# Patient Record
Sex: Male | Born: 1940 | Race: White | Hispanic: No | Marital: Married | Smoking: Former smoker
Health system: Southern US, Community
[De-identification: ages and names within clinical notes are randomized; demographics above are authoritative.]

## PROBLEM LIST (undated history)

## (undated) DIAGNOSIS — E041 Nontoxic single thyroid nodule: Secondary | ICD-10-CM

## (undated) DIAGNOSIS — G629 Polyneuropathy, unspecified: Secondary | ICD-10-CM

## (undated) DIAGNOSIS — I341 Nonrheumatic mitral (valve) prolapse: Secondary | ICD-10-CM

## (undated) DIAGNOSIS — M1990 Unspecified osteoarthritis, unspecified site: Secondary | ICD-10-CM

## (undated) DIAGNOSIS — G709 Myoneural disorder, unspecified: Secondary | ICD-10-CM

## (undated) DIAGNOSIS — F419 Anxiety disorder, unspecified: Secondary | ICD-10-CM

## (undated) DIAGNOSIS — M4800 Spinal stenosis, site unspecified: Secondary | ICD-10-CM

## (undated) DIAGNOSIS — R42 Dizziness and giddiness: Secondary | ICD-10-CM

## (undated) DIAGNOSIS — R011 Cardiac murmur, unspecified: Secondary | ICD-10-CM

## (undated) DIAGNOSIS — G8929 Other chronic pain: Secondary | ICD-10-CM

## (undated) DIAGNOSIS — E785 Hyperlipidemia, unspecified: Secondary | ICD-10-CM

## (undated) DIAGNOSIS — I4891 Unspecified atrial fibrillation: Secondary | ICD-10-CM

## (undated) DIAGNOSIS — F0390 Unspecified dementia without behavioral disturbance: Secondary | ICD-10-CM

## (undated) DIAGNOSIS — I2699 Other pulmonary embolism without acute cor pulmonale: Secondary | ICD-10-CM

## (undated) DIAGNOSIS — C801 Malignant (primary) neoplasm, unspecified: Secondary | ICD-10-CM

## (undated) DIAGNOSIS — K635 Polyp of colon: Secondary | ICD-10-CM

## (undated) DIAGNOSIS — K219 Gastro-esophageal reflux disease without esophagitis: Secondary | ICD-10-CM

## (undated) DIAGNOSIS — N281 Cyst of kidney, acquired: Secondary | ICD-10-CM

## (undated) DIAGNOSIS — M549 Dorsalgia, unspecified: Secondary | ICD-10-CM

## (undated) HISTORY — PX: APPENDECTOMY: SHX54

## (undated) HISTORY — PX: BACK SURGERY: SHX140

## (undated) HISTORY — PX: TONSILLECTOMY: SUR1361

---

## 2000-03-10 ENCOUNTER — Ambulatory Visit (HOSPITAL_COMMUNITY): Admission: RE | Admit: 2000-03-10 | Discharge: 2000-03-10 | Payer: Self-pay | Admitting: Neurosurgery

## 2000-03-10 ENCOUNTER — Encounter: Payer: Self-pay | Admitting: Neurosurgery

## 2000-04-05 ENCOUNTER — Encounter: Payer: Self-pay | Admitting: Neurosurgery

## 2000-04-05 ENCOUNTER — Ambulatory Visit (HOSPITAL_COMMUNITY): Admission: RE | Admit: 2000-04-05 | Discharge: 2000-04-05 | Payer: Self-pay | Admitting: Neurosurgery

## 2000-07-18 ENCOUNTER — Ambulatory Visit (HOSPITAL_COMMUNITY): Admission: RE | Admit: 2000-07-18 | Discharge: 2000-07-18 | Payer: Self-pay | Admitting: Neurology

## 2000-07-18 ENCOUNTER — Encounter: Payer: Self-pay | Admitting: Neurology

## 2004-01-01 ENCOUNTER — Ambulatory Visit: Payer: Self-pay | Admitting: Unknown Physician Specialty

## 2004-12-01 ENCOUNTER — Inpatient Hospital Stay: Payer: Self-pay | Admitting: Internal Medicine

## 2005-01-09 ENCOUNTER — Ambulatory Visit: Payer: Self-pay | Admitting: Internal Medicine

## 2005-01-23 ENCOUNTER — Ambulatory Visit: Payer: Self-pay | Admitting: Internal Medicine

## 2005-05-27 ENCOUNTER — Ambulatory Visit: Payer: Self-pay | Admitting: Internal Medicine

## 2005-10-13 ENCOUNTER — Ambulatory Visit: Payer: Self-pay | Admitting: Internal Medicine

## 2007-07-27 ENCOUNTER — Ambulatory Visit: Payer: Self-pay | Admitting: Internal Medicine

## 2009-10-06 ENCOUNTER — Emergency Department: Payer: Self-pay | Admitting: Internal Medicine

## 2010-06-10 ENCOUNTER — Encounter: Payer: Self-pay | Admitting: Otolaryngology

## 2011-09-11 DIAGNOSIS — G629 Polyneuropathy, unspecified: Secondary | ICD-10-CM | POA: Insufficient documentation

## 2011-09-11 DIAGNOSIS — N281 Cyst of kidney, acquired: Secondary | ICD-10-CM | POA: Insufficient documentation

## 2011-09-11 DIAGNOSIS — K219 Gastro-esophageal reflux disease without esophagitis: Secondary | ICD-10-CM | POA: Insufficient documentation

## 2011-09-11 DIAGNOSIS — E785 Hyperlipidemia, unspecified: Secondary | ICD-10-CM | POA: Insufficient documentation

## 2011-09-11 DIAGNOSIS — R011 Cardiac murmur, unspecified: Secondary | ICD-10-CM | POA: Insufficient documentation

## 2011-09-11 DIAGNOSIS — I2699 Other pulmonary embolism without acute cor pulmonale: Secondary | ICD-10-CM | POA: Insufficient documentation

## 2011-09-11 DIAGNOSIS — F419 Anxiety disorder, unspecified: Secondary | ICD-10-CM | POA: Insufficient documentation

## 2011-09-11 DIAGNOSIS — G8929 Other chronic pain: Secondary | ICD-10-CM | POA: Insufficient documentation

## 2012-10-04 DIAGNOSIS — Z7901 Long term (current) use of anticoagulants: Secondary | ICD-10-CM | POA: Insufficient documentation

## 2013-07-04 ENCOUNTER — Other Ambulatory Visit: Payer: Self-pay | Admitting: Internal Medicine

## 2013-07-04 LAB — CBC WITH DIFFERENTIAL/PLATELET
Basophil #: 0.1 10*3/uL (ref 0.0–0.1)
Basophil %: 1.2 %
Eosinophil #: 0.3 10*3/uL (ref 0.0–0.7)
Eosinophil %: 4.2 %
HCT: 46.9 % (ref 40.0–52.0)
HGB: 15.7 g/dL (ref 13.0–18.0)
Lymphocyte #: 2.2 10*3/uL (ref 1.0–3.6)
Lymphocyte %: 29.8 %
MCH: 30.4 pg (ref 26.0–34.0)
MCHC: 33.5 g/dL (ref 32.0–36.0)
MCV: 91 fL (ref 80–100)
Monocyte #: 0.8 x10 3/mm (ref 0.2–1.0)
Monocyte %: 10.9 %
Neutrophil #: 3.9 10*3/uL (ref 1.4–6.5)
Neutrophil %: 53.9 %
Platelet: 187 10*3/uL (ref 150–440)
RBC: 5.16 10*6/uL (ref 4.40–5.90)
RDW: 13.6 % (ref 11.5–14.5)
WBC: 7.2 10*3/uL (ref 3.8–10.6)

## 2013-07-04 LAB — PROTIME-INR
INR: 2.3
Prothrombin Time: 25 secs — ABNORMAL HIGH (ref 11.5–14.7)

## 2013-07-04 LAB — D-DIMER(ARMC): D-Dimer: 470 ng/ml

## 2014-03-11 ENCOUNTER — Emergency Department: Payer: Self-pay | Admitting: Emergency Medicine

## 2014-07-20 DIAGNOSIS — Z86010 Personal history of colonic polyps: Secondary | ICD-10-CM | POA: Insufficient documentation

## 2014-07-20 DIAGNOSIS — Z860101 Personal history of adenomatous and serrated colon polyps: Secondary | ICD-10-CM | POA: Insufficient documentation

## 2014-08-15 ENCOUNTER — Ambulatory Visit: Payer: Medicare Other | Admitting: Anesthesiology

## 2014-08-15 ENCOUNTER — Encounter
Admission: RE | Disposition: A | Discharge status: Home / Self Care | Payer: Self-pay | Source: Ambulatory Visit | Attending: Unknown Physician Specialty

## 2014-08-15 ENCOUNTER — Ambulatory Visit
Admission: RE | Admit: 2014-08-15 | Discharge: 2014-08-15 | Disposition: A | Discharge status: Home / Self Care | Payer: Medicare Other | Source: Ambulatory Visit | Attending: Unknown Physician Specialty | Admitting: Unknown Physician Specialty

## 2014-08-15 ENCOUNTER — Encounter: Payer: Self-pay | Admitting: *Deleted

## 2014-08-15 DIAGNOSIS — M5489 Other dorsalgia: Secondary | ICD-10-CM | POA: Diagnosis not present

## 2014-08-15 DIAGNOSIS — I4891 Unspecified atrial fibrillation: Secondary | ICD-10-CM | POA: Insufficient documentation

## 2014-08-15 DIAGNOSIS — F419 Anxiety disorder, unspecified: Secondary | ICD-10-CM | POA: Diagnosis not present

## 2014-08-15 DIAGNOSIS — D125 Benign neoplasm of sigmoid colon: Secondary | ICD-10-CM | POA: Insufficient documentation

## 2014-08-15 DIAGNOSIS — R42 Dizziness and giddiness: Secondary | ICD-10-CM | POA: Diagnosis not present

## 2014-08-15 DIAGNOSIS — G629 Polyneuropathy, unspecified: Secondary | ICD-10-CM | POA: Insufficient documentation

## 2014-08-15 DIAGNOSIS — E785 Hyperlipidemia, unspecified: Secondary | ICD-10-CM | POA: Diagnosis not present

## 2014-08-15 DIAGNOSIS — Z79899 Other long term (current) drug therapy: Secondary | ICD-10-CM | POA: Insufficient documentation

## 2014-08-15 DIAGNOSIS — R011 Cardiac murmur, unspecified: Secondary | ICD-10-CM | POA: Insufficient documentation

## 2014-08-15 DIAGNOSIS — Z881 Allergy status to other antibiotic agents status: Secondary | ICD-10-CM | POA: Insufficient documentation

## 2014-08-15 DIAGNOSIS — Z1211 Encounter for screening for malignant neoplasm of colon: Secondary | ICD-10-CM | POA: Diagnosis not present

## 2014-08-15 DIAGNOSIS — K219 Gastro-esophageal reflux disease without esophagitis: Secondary | ICD-10-CM | POA: Diagnosis not present

## 2014-08-15 DIAGNOSIS — Z880 Allergy status to penicillin: Secondary | ICD-10-CM | POA: Insufficient documentation

## 2014-08-15 DIAGNOSIS — D122 Benign neoplasm of ascending colon: Secondary | ICD-10-CM | POA: Insufficient documentation

## 2014-08-15 DIAGNOSIS — I2699 Other pulmonary embolism without acute cor pulmonale: Secondary | ICD-10-CM | POA: Insufficient documentation

## 2014-08-15 DIAGNOSIS — M4800 Spinal stenosis, site unspecified: Secondary | ICD-10-CM | POA: Diagnosis not present

## 2014-08-15 DIAGNOSIS — D123 Benign neoplasm of transverse colon: Secondary | ICD-10-CM | POA: Diagnosis not present

## 2014-08-15 DIAGNOSIS — Z7901 Long term (current) use of anticoagulants: Secondary | ICD-10-CM | POA: Diagnosis not present

## 2014-08-15 DIAGNOSIS — R12 Heartburn: Secondary | ICD-10-CM | POA: Diagnosis not present

## 2014-08-15 DIAGNOSIS — K317 Polyp of stomach and duodenum: Secondary | ICD-10-CM | POA: Insufficient documentation

## 2014-08-15 DIAGNOSIS — K449 Diaphragmatic hernia without obstruction or gangrene: Secondary | ICD-10-CM | POA: Diagnosis not present

## 2014-08-15 DIAGNOSIS — Z86010 Personal history of colonic polyps: Secondary | ICD-10-CM | POA: Diagnosis not present

## 2014-08-15 HISTORY — DX: Other pulmonary embolism without acute cor pulmonale: I26.99

## 2014-08-15 HISTORY — DX: Unspecified atrial fibrillation: I48.91

## 2014-08-15 HISTORY — DX: Gastro-esophageal reflux disease without esophagitis: K21.9

## 2014-08-15 HISTORY — DX: Polyp of colon: K63.5

## 2014-08-15 HISTORY — DX: Myoneural disorder, unspecified: G70.9

## 2014-08-15 HISTORY — DX: Anxiety disorder, unspecified: F41.9

## 2014-08-15 HISTORY — DX: Unspecified osteoarthritis, unspecified site: M19.90

## 2014-08-15 HISTORY — DX: Hyperlipidemia, unspecified: E78.5

## 2014-08-15 HISTORY — PX: ESOPHAGOGASTRODUODENOSCOPY: SHX5428

## 2014-08-15 HISTORY — DX: Other chronic pain: G89.29

## 2014-08-15 HISTORY — DX: Dizziness and giddiness: R42

## 2014-08-15 HISTORY — DX: Spinal stenosis, site unspecified: M48.00

## 2014-08-15 HISTORY — DX: Nontoxic single thyroid nodule: E04.1

## 2014-08-15 HISTORY — DX: Cyst of kidney, acquired: N28.1

## 2014-08-15 HISTORY — DX: Dorsalgia, unspecified: M54.9

## 2014-08-15 HISTORY — DX: Cardiac murmur, unspecified: R01.1

## 2014-08-15 HISTORY — DX: Polyneuropathy, unspecified: G62.9

## 2014-08-15 HISTORY — PX: COLONOSCOPY: SHX5424

## 2014-08-15 HISTORY — DX: Nonrheumatic mitral (valve) prolapse: I34.1

## 2014-08-15 SURGERY — COLONOSCOPY
Anesthesia: General

## 2014-08-15 MED ORDER — SODIUM CHLORIDE 0.9 % IV SOLN: INTRAVENOUS | Status: DC

## 2014-08-15 MED ORDER — FENTANYL CITRATE (PF) 100 MCG/2ML IJ SOLN
INTRAMUSCULAR | Status: DC | PRN
  Administered 2014-08-15: 50 ug via INTRAVENOUS

## 2014-08-15 MED ORDER — MIDAZOLAM HCL 5 MG/5ML IJ SOLN
INTRAMUSCULAR | Status: DC | PRN
  Administered 2014-08-15: 1 mg via INTRAVENOUS

## 2014-08-15 MED ORDER — LIDOCAINE HCL (PF) 2 % IJ SOLN
INTRAMUSCULAR | Status: DC | PRN
  Administered 2014-08-15: 50 mg

## 2014-08-15 MED ORDER — PROPOFOL INFUSION 10 MG/ML OPTIME
INTRAVENOUS | Status: DC | PRN
  Administered 2014-08-15: 160 ug/kg/min via INTRAVENOUS

## 2014-08-15 MED ORDER — LACTATED RINGERS IV SOLN
INTRAVENOUS | Status: DC
  Administered 2014-08-15: 1000 mL via INTRAVENOUS

## 2014-08-15 MED ORDER — PROPOFOL 10 MG/ML IV BOLUS
INTRAVENOUS | Status: DC | PRN
  Administered 2014-08-15: 50 mg via INTRAVENOUS

## 2014-08-15 NOTE — Anesthesia Preprocedure Evaluation (Signed)
Anesthesia Evaluation  Patient identified by MRN, date of birth, ID band Patient awake    Reviewed: Allergy & Precautions, NPO status , Patient's Chart, lab work & pertinent test results  History of Anesthesia Complications Negative for: history of anesthetic complications  Airway Mallampati: III       Dental no notable dental hx. (+) Teeth Intact   Pulmonary former smoker,    Pulmonary exam normal       Cardiovascular negative cardio ROS Normal cardiovascular examRhythm:Regular Rate:Normal     Neuro/Psych Anxiety    GI/Hepatic Neg liver ROS, GERD-  ,  Endo/Other  negative endocrine ROS  Renal/GU Renal disease  negative genitourinary   Musculoskeletal  (+) Arthritis -, Osteoarthritis,    Abdominal Normal abdominal exam  (+)   Peds  Hematology negative hematology ROS (+)   Anesthesia Other Findings   Reproductive/Obstetrics negative OB ROS                             Anesthesia Physical Anesthesia Plan  ASA: II  Anesthesia Plan: General   Post-op Pain Management:    Induction: Intravenous  Airway Management Planned: Nasal Cannula  Additional Equipment:   Intra-op Plan:   Post-operative Plan:   Informed Consent: I have reviewed the patients History and Physical, chart, labs and discussed the procedure including the risks, benefits and alternatives for the proposed anesthesia with the patient or authorized representative who has indicated his/her understanding and acceptance.     Plan Discussed with: CRNA  Anesthesia Plan Comments:         Anesthesia Quick Evaluation

## 2014-08-15 NOTE — H&P (Signed)
   Primary Care Physician:  Tracie Harrier, MD Primary Gastroenterologist:  Dr. Vira Agar  Pre-Procedure History & Physical: HPI:  Mark [name]. is a 74 y.o. male is here for an endoscopy and colonoscopy.   Past Medical History  Diagnosis Date  . Colon polyp   . Atrial fibrillation   . Pulmonary emboli   . Neuropathy   . Neuromuscular disorder   . Vertigo   . Back pain, chronic   . GERD (gastroesophageal reflux disease)   . Hyperlipemia   . Anxiety   . Heart murmur   . Spinal stenosis   . Thyroid nodule   . Cyst of left kidney   . Osteoarthritis   . Mitral valve prolapse     Past Surgical History  Procedure Laterality Date  . Appendectomy    . Tonsillectomy    . Back surgery      Prior to Admission medications   Medication Sig Start Date End Date Taking? Authorizing Provider  Esomeprazole Magnesium (NEXIUM PO) Take 22.3 mg by mouth every Monday, Wednesday, and Friday.   Yes Historical Provider, MD  famotidine (PEPCID AC) 10 MG chewable tablet Chew 20 mg by mouth 2 (two) times daily as needed for heartburn.   Yes Historical Provider, MD  FLUoxetine (PROZAC) 20 MG capsule Take 20 mg by mouth daily. Wed, sat, sun   Yes Historical Provider, MD  FLUoxetine (PROZAC) 40 MG capsule Take 40 mg by mouth daily. Mon, tues, thurs, fri   Yes Historical Provider, MD  simvastatin (ZOCOR) 40 MG tablet Take 40 mg by mouth daily.   Yes Historical Provider, MD  warfarin (COUMADIN) 5 MG tablet Take 5 mg by mouth every evening. 1 tab on Mon, tues, thurs, fri. 1/2 tab on wed sat and sun   Yes Historical Provider, MD    Allergies as of 07/26/2014  . (Not on File)    History reviewed. No pertinent family history.  History   Social History  . Marital Status: Married    Spouse Name: N/A  . Number of Children: N/A  . Years of Education: N/A   Occupational History  . Not on file.   Social History Main Topics  . Smoking status: Never Smoker   . Smokeless tobacco: Not on file  .  Alcohol Use: Not on file  . Drug Use: Not on file  . Sexual Activity: Not on file   Other Topics Concern  . Not on file   Social History Narrative    Review of Systems: See HPI, otherwise negative ROS  Physical Exam: BP 128/71 mmHg  Pulse 61  Temp(Src) 98.6 F (37 C) (Oral)  Resp 18  Ht 6\' 4"  (1.93 m)  Wt 99.791 kg (220 lb)  BMI 26.79 kg/m2  SpO2 96% General:   Alert,  pleasant and cooperative in NAD Head:  Normocephalic and atraumatic. Neck:  Supple; no masses or thyromegaly. Lungs:  Clear throughout to auscultation.    Heart:  Regular rate and rhythm. Abdomen:  Soft, nontender and nondistended. Normal bowel sounds, without guarding, and without rebound.   Neurologic:  Alert and  oriented x4;  grossly normal neurologically.  Impression/Plan: Mark [name]. is here for an endoscopy and colonoscopy to be performed for GERD and personal history of colon polyps.  Risks, benefits, limitations, and alternatives regarding  endoscopy and colonoscopy have been reviewed with the patient.  Questions have been answered.  All parties agreeable.   Gaylyn Cheers, MD  08/15/2014, 11:08 AM

## 2014-08-15 NOTE — Op Note (Signed)
[facility] Gastroenterology Patient Name: Mark [name] Procedure Date: 08/15/2014 10:58 AM MRN: [id number] Account #: [id number] Date of Birth: 05-Mar-1941 Admit Type: Outpatient Age: 74 Room: [facility] ENDO ROOM 3 Gender: Male Note Status: Finalized Procedure:         Upper GI endoscopy Indications:       Heartburn Providers:         [name], MD Referring MD:      Tracie Harrier, MD (Referring MD) Medicines:         Propofol per Anesthesia Complications:     No immediate complications. Procedure:         Pre-Anesthesia Assessment:                    - After reviewing the risks and benefits, the patient was                     deemed in satisfactory condition to undergo the procedure.                    After obtaining informed consent, the endoscope was passed                     under direct vision. Throughout the procedure, the                     patient's blood pressure, pulse, and oxygen saturations                     were monitored continuously. The Olympus GIF-160 endoscope                     (S#. [id number]) was introduced through the mouth, and                     advanced to the second part of duodenum. The upper GI                     endoscopy was accomplished without difficulty. The patient                     tolerated the procedure well. Findings:      The examined esophagus was normal. GEJ 40cm      A small hiatus hernia was present.      Multiple small sessile polyps with no bleeding and no stigmata of recent       bleeding were found in the gastric body. Biopsies were taken with a cold       forceps for histology. One at 47cm was placed in its own container.      The examined duodenum was normal. Impression:        - Normal esophagus.                    - Small hiatus hernia.                    - Multiple gastric polyps. Biopsied.                    - Normal examined duodenum. Recommendation:    - Await pathology results. [name], MD 08/15/2014 11:23:29 AM This report has been signed electronically. Number of Addenda: 0 Note Initiated On: 08/15/2014 10:58 AM      [facility]

## 2014-08-15 NOTE — Op Note (Signed)
[facility] Gastroenterology Patient Name: Mark [name] Procedure Date: 08/15/2014 10:57 AM MRN: [id number] Account #: [id number] Date of Birth: December 27, 1940 Admit Type: Outpatient Age: 74 Room: [facility] ENDO ROOM 3 Gender: Male Note Status: Finalized Procedure:         Colonoscopy Indications:       Follow-up for history of adenomatous polyps in the colon Providers:         [name], MD Referring MD:      Tracie Harrier, MD (Referring MD) Medicines:         Propofol per Anesthesia Complications:     No immediate complications. Procedure:         Pre-Anesthesia Assessment:                    - After reviewing the risks and benefits, the patient was                     deemed in satisfactory condition to undergo the procedure.                    After obtaining informed consent, the colonoscope was                     passed under direct vision. Throughout the procedure, the                     patient's blood pressure, pulse, and oxygen saturations                     were monitored continuously. The Colonoscope was                     introduced through the anus and advanced to the the cecum,                     identified by appendiceal orifice and ileocecal valve. The                     colonoscopy was performed without difficulty. The patient                     tolerated the procedure well. The quality of the bowel                     preparation was excellent. Findings:      A small polyp was found in the ascending colon. The polyp was sessile.       The polyp was removed with a cold snare. Resection and retrieval were       complete.      A diminutive polyp was found in the transverse colon. The polyp was       sessile. The polyp was removed with a jumbo cold forceps. Resection and       retrieval were complete.      A small polyp was found in the sigmoid colon. The polyp was sessile. The       polyp was removed with a cold snare. Resection and  retrieval were       complete.      A few small-mouthed diverticula were found in the sigmoid colon. Impression:        - One small polyp in the ascending colon. Resected and  retrieved.                    - One diminutive polyp in the transverse colon. Resected                     and retrieved.                    - One small polyp in the sigmoid colon. Resected and                     retrieved. Recommendation:    - Await pathology results. [name], MD 08/15/2014 11:55:06 AM This report has been signed electronically. Number of Addenda: 0 Note Initiated On: 08/15/2014 10:57 AM Scope Withdrawal Time: 0 hours 14 minutes 6 seconds  Total Procedure Duration: 0 hours 25 minutes 31 seconds       [facility]

## 2014-08-15 NOTE — Transfer of Care (Signed)
Immediate Anesthesia Transfer of Care Note  Patient: Mark [name].  Procedure(s) Performed: Procedure(s): COLONOSCOPY (N/A) ESOPHAGOGASTRODUODENOSCOPY (EGD) (N/A)  Patient Location: PACU  Anesthesia Type:General  Level of Consciousness: sedated  Airway & Oxygen Therapy: Patient Spontanous Breathing and Patient connected to nasal cannula oxygen  Post-op Assessment: Report given to RN and Post -op Vital signs reviewed and stable  Post vital signs: Reviewed and stable  Last Vitals:  Filed Vitals:   08/15/14 1025  BP: 128/71  Pulse: 61  Temp: 37 C  Resp: 18    Complications: No apparent anesthesia complications

## 2014-08-16 ENCOUNTER — Encounter: Payer: Self-pay | Admitting: Unknown Physician Specialty

## 2014-08-17 LAB — SURGICAL PATHOLOGY

## 2014-08-24 NOTE — Anesthesia Postprocedure Evaluation (Signed)
  Anesthesia Post-op Note  Patient: Mark [name].  Procedure(s) Performed: Procedure(s): COLONOSCOPY (N/A) ESOPHAGOGASTRODUODENOSCOPY (EGD) (N/A)  Anesthesia type:General  Patient location: PACU  Post pain: Pain level controlled  Post assessment: Post-op Vital signs reviewed, Patient's Cardiovascular Status Stable, Respiratory Function Stable, Patent Airway and No signs of Nausea or vomiting  Post vital signs: Reviewed and stable  Last Vitals:  Filed Vitals:   08/15/14 1230  BP:   Pulse: 55  Temp:   Resp: 12    Level of consciousness: awake, alert  and patient cooperative  Complications: No apparent anesthesia complications

## 2015-06-25 DIAGNOSIS — M19011 Primary osteoarthritis, right shoulder: Secondary | ICD-10-CM | POA: Insufficient documentation

## 2015-10-10 DIAGNOSIS — M545 Low back pain: Secondary | ICD-10-CM

## 2015-10-10 DIAGNOSIS — G8929 Other chronic pain: Secondary | ICD-10-CM | POA: Insufficient documentation

## 2016-01-07 DIAGNOSIS — M47817 Spondylosis without myelopathy or radiculopathy, lumbosacral region: Secondary | ICD-10-CM | POA: Insufficient documentation

## 2016-02-25 ENCOUNTER — Encounter: Payer: Self-pay | Admitting: Emergency Medicine

## 2016-02-25 ENCOUNTER — Emergency Department: Payer: Medicare Other

## 2016-02-25 ENCOUNTER — Emergency Department
Admission: EM | Admit: 2016-02-25 | Discharge: 2016-02-25 | Disposition: A | Discharge status: Home / Self Care | Payer: Medicare Other | Attending: Emergency Medicine | Admitting: Emergency Medicine

## 2016-02-25 DIAGNOSIS — M545 Low back pain: Secondary | ICD-10-CM | POA: Diagnosis not present

## 2016-02-25 DIAGNOSIS — W010XXA Fall on same level from slipping, tripping and stumbling without subsequent striking against object, initial encounter: Secondary | ICD-10-CM | POA: Diagnosis not present

## 2016-02-25 DIAGNOSIS — M25551 Pain in right hip: Secondary | ICD-10-CM | POA: Diagnosis not present

## 2016-02-25 DIAGNOSIS — M25521 Pain in right elbow: Secondary | ICD-10-CM | POA: Insufficient documentation

## 2016-02-25 DIAGNOSIS — S3992XA Unspecified injury of lower back, initial encounter: Secondary | ICD-10-CM | POA: Diagnosis present

## 2016-02-25 DIAGNOSIS — Y939 Activity, unspecified: Secondary | ICD-10-CM | POA: Diagnosis not present

## 2016-02-25 DIAGNOSIS — Y999 Unspecified external cause status: Secondary | ICD-10-CM | POA: Insufficient documentation

## 2016-02-25 DIAGNOSIS — Y929 Unspecified place or not applicable: Secondary | ICD-10-CM | POA: Insufficient documentation

## 2016-02-25 DIAGNOSIS — W19XXXA Unspecified fall, initial encounter: Secondary | ICD-10-CM

## 2016-02-25 MED ORDER — TRAMADOL HCL 50 MG PO TABS: 50.0000 mg | ORAL_TABLET | Freq: Four times a day (QID) | ORAL | 0 refills | Status: AC | PRN

## 2016-02-25 NOTE — ED Triage Notes (Signed)
Pt states he fell yesterday injuring his right hip and elbow pain. States he sat around all day yesterday and today the pain is worse. Pain is worse even when he sits.

## 2016-02-25 NOTE — ED Notes (Signed)
Pt in via triage; pt reports falling Monday morning, landing on right hip, reports being able to get up and ambulatory after the fall.  States when he woke up this morning, had a difficult time getting out of bed due to lower back pain; pt reports difficulty ambulating today due to pain.  Pt A/Ox4, no immediate distress at this time.

## 2016-02-25 NOTE — ED Provider Notes (Signed)
[facility] Emergency Department Provider Note  Time seen: 5:31 PM  I have reviewed the triage vital signs and the nursing notes.   HISTORY  Chief Complaint Fall    HPI Mark Maxim. is a 75 y.o. male who presents to the emergency department after a fall yesterday morning. According to the patient he tripped falling onto his right side yesterday morning. States he did not think much of it however over the day yesterday and today today he states the pain has worsened somewhat especially across his lower back. Patient states a history of a lumbar laminectomy many many years ago. States the pain appears to start in the right hip and go across his lower back. Denies any pain when sitting and resting but states the pain is while ambulating or when extending his back. He also hit his right elbow states mild pain in the right elbow but has full range of motion without any difficulty. Patient denies hitting his head. Denies headache. Denies LOC. 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  Past Medical History:  Diagnosis Date  . Anxiety   . Atrial fibrillation ([place])   . Back pain, chronic   . Colon polyp   . Cyst of left kidney   . GERD (gastroesophageal reflux disease)   . Heart murmur   . Hyperlipemia   . Mitral valve prolapse   . Neuromuscular disorder ([place])   . Neuropathy ([place])   . Osteoarthritis   . Pulmonary emboli ([place])   . Spinal stenosis   . Thyroid nodule   . Vertigo     There are no active problems to display for this patient.   Past Surgical History:  Procedure Laterality Date  . APPENDECTOMY    . BACK SURGERY    . COLONOSCOPY N/A 08/15/2014   Procedure: COLONOSCOPY;  Surgeon: [name], MD;  Location: [facility] ENDOSCOPY;  Service: Endoscopy;  Laterality: N/A;  . ESOPHAGOGASTRODUODENOSCOPY N/A 08/15/2014   Procedure: ESOPHAGOGASTRODUODENOSCOPY (EGD);  Surgeon: [name], MD;  Location: [facility] ENDOSCOPY;  Service:  Endoscopy;  Laterality: N/A;  . TONSILLECTOMY      Prior to Admission medications   Medication Sig Start Date End Date Taking? Authorizing Provider  Esomeprazole Magnesium (NEXIUM PO) Take 22.3 mg by mouth every Monday, Wednesday, and Friday.    Historical Provider, MD  famotidine (PEPCID AC) 10 MG chewable tablet Chew 20 mg by mouth 2 (two) times daily as needed for heartburn.    Historical Provider, MD  FLUoxetine (PROZAC) 20 MG capsule Take 20 mg by mouth daily. Wed, sat, sun    Historical Provider, MD  FLUoxetine (PROZAC) 40 MG capsule Take 40 mg by mouth daily. Mon, tues, thurs, fri    Historical Provider, MD  simvastatin (ZOCOR) 40 MG tablet Take 40 mg by mouth daily.    Historical Provider, MD  warfarin (COUMADIN) 5 MG tablet Take 5 mg by mouth every evening. 1 tab on Mon, tues, thurs, fri. 1/2 tab on wed sat and sun    Historical Provider, MD    Allergies  Allergen Reactions  . Erythromycin Swelling  . Keflex [Cephalexin] Swelling  . Penicillins     No family history on file.  Social History Social History  Substance Use Topics  . Smoking status: Never Smoker  . Smokeless tobacco: Never Used  . Alcohol use 0.6 oz/week    1 Glasses of wine per week    Review of Systems Constitutional: Negative for  fever. Cardiovascular: Negative for chest pain. Respiratory: Negative for shortness of breath. Gastrointestinal: Negative for abdominal pain Genitourinary: Negative for dysuria. Musculoskeletal: Right hip and lower back pain. Right elbow pain. Neurological: Negative for headache 10-point ROS otherwise negative.  ____________________________________________   PHYSICAL EXAM:  VITAL SIGNS: ED Triage Vitals  Enc Vitals Group     BP 02/25/16 1658 138/76     Pulse Rate 02/25/16 1658 77     Resp 02/25/16 1658 20     Temp 02/25/16 1658 98.3 F (36.8 C)     Temp Source 02/25/16 1658 Oral     SpO2 02/25/16 1658 98 %     Weight 02/25/16 1659 218 lb (98.9 kg)     Height  02/25/16 1659 6\' 3"  (1.905 m)     Head Circumference --      Peak Flow --      Pain Score 02/25/16 1659 8     Pain Loc --      Pain Edu? --      Excl. in [place]? --     Constitutional: Alert and oriented. Well appearing and in no distress. Eyes: Normal exam ENT   Head: Normocephalic and atraumatic.   Mouth/Throat: Mucous membranes are moist. Cardiovascular: Normal rate, regular rhythm. No murmur Respiratory: Normal respiratory effort without tachypnea nor retractions. Breath sounds are clear  Gastrointestinal: Soft and nontender. No distention.  Musculoskeletal: No elbow tenderness, and please range of motion with minimal pain elicited. Neurovascularly intact. No swelling. Normal range of motion in right hip, nontender to palpation, no lumbar spine tenderness to palpation. Patient points to the area that hurts when he moves and it appears to be just lateral to the SI joint, no tenderness with palpation. Neurologic:  Normal speech and language. No gross focal neurologic deficits  Skin:  Skin is warm, dry and intact.  Psychiatric: Mood and affect are normal.   ____________________________________________   INITIAL IMPRESSION / ASSESSMENT AND PLAN / ED COURSE  Pertinent labs & imaging results that were available during my care of the patient were reviewed by me and considered in my medical decision making (see chart for details).  Patient presents to the emergency department after a fall yesterday morning with continued pain especially across his lower back. States the pain is positional especially when walking. Patient states mild pain in the right elbow but complete range of motion with little to no pain elicited. No concern of fracture in the right elbow. Patient has good range of motion in the right hip nontender right hip, nontender lumbar spine but the patient states the pain shoots all the way across his lower back. We will obtain x-ray imaging of the right hip/pelvis and lumbar  spine to further evaluate. I suspect likely muscular pain.   X-rays have resulted as normal. Highly suspect muscular pain. We'll discharge with tramadol to be used as needed and PCP follow-up. Patient agreeable. ____________________________________________   FINAL CLINICAL IMPRESSION(S) / ED DIAGNOSES  Fall Musculoskeletal pain    Harvest Dark, MD 02/25/16 [id number]

## 2016-02-25 NOTE — ED Notes (Signed)
Patient transported to X-ray 

## 2016-04-03 ENCOUNTER — Other Ambulatory Visit: Payer: Self-pay | Admitting: Internal Medicine

## 2016-04-03 DIAGNOSIS — M4807 Spinal stenosis, lumbosacral region: Secondary | ICD-10-CM

## 2016-04-03 DIAGNOSIS — R262 Difficulty in walking, not elsewhere classified: Secondary | ICD-10-CM

## 2016-04-03 DIAGNOSIS — M545 Low back pain: Secondary | ICD-10-CM

## 2016-04-03 DIAGNOSIS — G8929 Other chronic pain: Secondary | ICD-10-CM

## 2016-04-16 ENCOUNTER — Ambulatory Visit
Admission: RE | Admit: 2016-04-16 | Discharge: 2016-04-16 | Disposition: A | Discharge status: Home / Self Care | Payer: Medicare Other | Source: Ambulatory Visit | Attending: Internal Medicine | Admitting: Internal Medicine

## 2016-04-16 DIAGNOSIS — M48061 Spinal stenosis, lumbar region without neurogenic claudication: Secondary | ICD-10-CM | POA: Diagnosis not present

## 2016-04-16 DIAGNOSIS — R262 Difficulty in walking, not elsewhere classified: Secondary | ICD-10-CM

## 2016-04-16 DIAGNOSIS — M4807 Spinal stenosis, lumbosacral region: Secondary | ICD-10-CM | POA: Diagnosis present

## 2016-04-16 DIAGNOSIS — M4696 Unspecified inflammatory spondylopathy, lumbar region: Secondary | ICD-10-CM | POA: Insufficient documentation

## 2016-04-16 DIAGNOSIS — G8929 Other chronic pain: Secondary | ICD-10-CM

## 2016-04-16 DIAGNOSIS — Z981 Arthrodesis status: Secondary | ICD-10-CM | POA: Diagnosis not present

## 2016-04-16 DIAGNOSIS — M545 Low back pain: Secondary | ICD-10-CM | POA: Diagnosis present

## 2016-04-21 ENCOUNTER — Ambulatory Visit
Admission: RE | Admit: 2016-04-21 | Discharge: 2016-04-21 | Disposition: A | Discharge status: Home / Self Care | Payer: Medicare Other | Source: Ambulatory Visit | Attending: Internal Medicine | Admitting: Internal Medicine

## 2016-04-21 DIAGNOSIS — M48061 Spinal stenosis, lumbar region without neurogenic claudication: Secondary | ICD-10-CM | POA: Diagnosis not present

## 2016-04-21 DIAGNOSIS — M5137 Other intervertebral disc degeneration, lumbosacral region: Secondary | ICD-10-CM | POA: Insufficient documentation

## 2016-04-21 DIAGNOSIS — M545 Low back pain: Secondary | ICD-10-CM | POA: Diagnosis present

## 2016-04-21 DIAGNOSIS — M4807 Spinal stenosis, lumbosacral region: Secondary | ICD-10-CM | POA: Diagnosis present

## 2016-04-21 DIAGNOSIS — M47816 Spondylosis without myelopathy or radiculopathy, lumbar region: Secondary | ICD-10-CM | POA: Diagnosis not present

## 2016-04-21 DIAGNOSIS — M5136 Other intervertebral disc degeneration, lumbar region: Secondary | ICD-10-CM | POA: Diagnosis not present

## 2016-04-21 DIAGNOSIS — G8929 Other chronic pain: Secondary | ICD-10-CM

## 2016-04-21 LAB — POCT I-STAT CREATININE: CREATININE: 0.9 mg/dL (ref 0.61–1.24)

## 2016-04-21 MED ORDER — GADOBENATE DIMEGLUMINE 529 MG/ML IV SOLN
20.0000 mL | Freq: Once | INTRAVENOUS | Status: AC | PRN
  Administered 2016-04-21: 20 mL via INTRAVENOUS

## 2016-07-01 DIAGNOSIS — M5136 Other intervertebral disc degeneration, lumbar region: Secondary | ICD-10-CM | POA: Insufficient documentation

## 2016-11-06 ENCOUNTER — Other Ambulatory Visit: Payer: Self-pay | Admitting: Neurosurgery

## 2016-11-06 DIAGNOSIS — M545 Low back pain, unspecified: Secondary | ICD-10-CM

## 2016-11-06 DIAGNOSIS — G8929 Other chronic pain: Secondary | ICD-10-CM

## 2016-11-12 ENCOUNTER — Ambulatory Visit
Admission: RE | Admit: 2016-11-12 | Discharge: 2016-11-12 | Disposition: A | Discharge status: Home / Self Care | Payer: Medicare Other | Source: Ambulatory Visit | Attending: Neurosurgery | Admitting: Neurosurgery

## 2016-11-12 DIAGNOSIS — M546 Pain in thoracic spine: Secondary | ICD-10-CM | POA: Diagnosis present

## 2016-11-12 DIAGNOSIS — M4324 Fusion of spine, thoracic region: Secondary | ICD-10-CM | POA: Insufficient documentation

## 2016-11-12 DIAGNOSIS — M5134 Other intervertebral disc degeneration, thoracic region: Secondary | ICD-10-CM | POA: Insufficient documentation

## 2016-11-12 DIAGNOSIS — M545 Low back pain: Secondary | ICD-10-CM | POA: Insufficient documentation

## 2016-11-12 DIAGNOSIS — G8929 Other chronic pain: Secondary | ICD-10-CM

## 2016-11-16 DIAGNOSIS — R0602 Shortness of breath: Secondary | ICD-10-CM | POA: Insufficient documentation

## 2016-11-17 DIAGNOSIS — M1990 Unspecified osteoarthritis, unspecified site: Secondary | ICD-10-CM | POA: Insufficient documentation

## 2016-12-23 DIAGNOSIS — R42 Dizziness and giddiness: Secondary | ICD-10-CM | POA: Insufficient documentation

## 2016-12-23 DIAGNOSIS — R200 Anesthesia of skin: Secondary | ICD-10-CM | POA: Insufficient documentation

## 2016-12-23 DIAGNOSIS — R29898 Other symptoms and signs involving the musculoskeletal system: Secondary | ICD-10-CM | POA: Insufficient documentation

## 2016-12-24 ENCOUNTER — Encounter: Payer: Self-pay | Admitting: Urology

## 2016-12-24 ENCOUNTER — Ambulatory Visit (INDEPENDENT_AMBULATORY_CARE_PROVIDER_SITE_OTHER): Payer: Medicare Other | Admitting: Urology

## 2016-12-24 VITALS — BP 127/61 | HR 112 | Ht 76.0 in | Wt 230.0 lb

## 2016-12-24 DIAGNOSIS — R300 Dysuria: Secondary | ICD-10-CM

## 2016-12-24 DIAGNOSIS — R351 Nocturia: Secondary | ICD-10-CM

## 2016-12-24 DIAGNOSIS — N401 Enlarged prostate with lower urinary tract symptoms: Secondary | ICD-10-CM | POA: Diagnosis not present

## 2016-12-24 DIAGNOSIS — R350 Frequency of micturition: Secondary | ICD-10-CM | POA: Diagnosis not present

## 2016-12-24 LAB — URINALYSIS, COMPLETE

## 2016-12-24 LAB — BLADDER SCAN AMB NON-IMAGING

## 2016-12-24 MED ORDER — URIBEL 118 MG PO CAPS: 1.0000 | ORAL_CAPSULE | Freq: Four times a day (QID) | ORAL | 2 refills | Status: DC | PRN

## 2016-12-24 NOTE — Progress Notes (Signed)
12/24/2016 10:54 AM   Mark [name]. [birth year] [id number]  Referring provider: Tracie Harrier, MD [address] [facility] [place],  [address]  Chief Complaint  Patient presents with  . Benign Prostatic Hypertrophy    New Patient    HPI:  76 yo M referred for further evaluation of ongoing irritative voiding symptoms.  He complains of ongoing chronic dysuria and frequency.  Recently seen and evaluated by Dr. Risa Grill about 1 month ago.  He was started on Mybetriq for urinary frequency but this is not helped significantly with his voiding symptoms.  He also underwent cystoscopy, records requested today but do not see the results.  He is currently on finasteride and flomax which has been taking for quite some time.Marland Kitchen    PSA 0.64 on 10/11//2017  Primary complains nocturia q2 without incontience.    UA today uremarkable.  Currently on Azo for dysuria therefore dip is positive but microscopic UA uremarkable.    PMH: Past Medical History:  Diagnosis Date  . Anxiety   . Atrial fibrillation ([place])   . Back pain, chronic   . Colon polyp   . Cyst of left kidney   . GERD (gastroesophageal reflux disease)   . Heart murmur   . Hyperlipemia   . Mitral valve prolapse   . Neuromuscular disorder ([place])   . Neuropathy   . Osteoarthritis   . Pulmonary emboli ([place])   . Spinal stenosis   . Thyroid nodule   . Vertigo     Surgical History: Past Surgical History:  Procedure Laterality Date  . APPENDECTOMY    . BACK SURGERY    . COLONOSCOPY N/A 08/15/2014   Procedure: COLONOSCOPY;  Surgeon: [name], MD;  Location: [facility] ENDOSCOPY;  Service: Endoscopy;  Laterality: N/A;  . ESOPHAGOGASTRODUODENOSCOPY N/A 08/15/2014   Procedure: ESOPHAGOGASTRODUODENOSCOPY (EGD);  Surgeon: [name], MD;  Location: [facility] ENDOSCOPY;  Service: Endoscopy;  Laterality: N/A;  . TONSILLECTOMY      Home Medications:  Allergies as of 12/24/2016      Reactions   Erythromycin Swelling   Keflex [cephalexin] Swelling   Penicillins       Medication List       Accurate as of 12/24/16 11:59 PM. Always use your most recent med list.          famotidine 10 MG chewable tablet Commonly known as:  PEPCID AC Chew 20 mg by mouth 2 (two) times daily as needed for heartburn.   finasteride 5 MG tablet Commonly known as:  PROSCAR   FLUoxetine 40 MG capsule Commonly known as:  PROZAC Take 40 mg by mouth daily. Mon, tues, thurs, fri   gabapentin 100 MG capsule Commonly known as:  NEURONTIN   NEXIUM PO Take 22.3 mg by mouth every Monday, Wednesday, and Friday.   nortriptyline 10 MG capsule Commonly known as:  PAMELOR   simvastatin 40 MG tablet Commonly known as:  ZOCOR Take 40 mg by mouth daily.   tamsulosin 0.4 MG Caps capsule Commonly known as:  FLOMAX   traMADol 50 MG tablet Commonly known as:  ULTRAM Take 1 tablet (50 mg total) by mouth every 6 (six) hours as needed.   URIBEL 118 MG Caps Take 1 capsule (118 mg total) by mouth 4 (four) times daily as needed.   warfarin 5 MG tablet Commonly known as:  COUMADIN Take 5 mg by mouth every evening. 1 tab on Mon, tues, thurs, fri. 1/2 tab on wed sat and  sun       Allergies:  Allergies  Allergen Reactions  . Erythromycin Swelling  . Keflex [Cephalexin] Swelling  . Penicillins     Family History: No family history on file.  Social History:  reports that he has never smoked. He has never used smokeless tobacco. He reports that he drinks about 0.6 oz of alcohol per week . His drug history is not on file.  ROS: UROLOGY Frequent Urination?: No Hard to postpone urination?: No Burning/pain with urination?: Yes Get up at night to urinate?: Yes Leakage of urine?: No Urine stream starts and stops?: Yes Trouble starting stream?: No Do you have to strain to urinate?: No Blood in urine?: No Urinary tract infection?: No Sexually transmitted disease?: No Injury to kidneys or bladder?:  No Painful intercourse?: No Weak stream?: No Erection problems?: Yes Penile pain?: Yes  Gastrointestinal Nausea?: No Vomiting?: No Indigestion/heartburn?: Yes Diarrhea?: No Constipation?: Yes  Constitutional Fever: No Night sweats?: No Weight loss?: No Fatigue?: No  Skin Skin rash/lesions?: Yes Itching?: Yes  Eyes Blurred vision?: No Double vision?: No  Ears/Nose/Throat Sore throat?: No Sinus problems?: No  Hematologic/Lymphatic Swollen glands?: No Easy bruising?: No  Cardiovascular Leg swelling?: No Chest pain?: No  Respiratory Cough?: No Shortness of breath?: No  Endocrine Excessive thirst?: No  Musculoskeletal Back pain?: Yes Joint pain?: No  Neurological Headaches?: No Dizziness?: No  Psychologic Depression?: No Anxiety?: Yes  Physical Exam: BP 127/61   Pulse (!) 112   Ht 6\' 4"  (1.93 m)   Wt 230 lb (104.3 kg)   BMI 28.00 kg/m   Constitutional:  Alert and oriented, No acute distress.  Wife today. HEENT: [place] AT, moist mucus membranes.  Trachea midline, no masses. Cardiovascular: No clubbing, cyanosis, or edema. Respiratory: Normal respiratory effort, no increased work of breathing. GI: Abdomen is soft, nontender, nondistended, no abdominal masses GU: No CVA tenderness.  Rectal exam deferred, recently performed by Dr. Risa Grill Skin: No rashes, bruises or suspicious lesions. Neurologic: Grossly intact, no focal deficits, moving all 4 extremities. Psychiatric: Normal mood and affect.  Laboratory Data: Lab Results  Component Value Date   WBC 7.2 07/04/2013   HGB 15.7 07/04/2013   HCT 46.9 07/04/2013   MCV 91 07/04/2013   PLT 187 07/04/2013    Lab Results  Component Value Date   CREATININE 0.90 04/21/2016    Urinalysis Results for orders placed or performed in visit on 12/24/16  Urinalysis, Complete  Result Value Ref Range   Specific Gravity, UA CANCELED    pH, UA CANCELED    Color, UA Orange Yellow   Appearance Ur Clear  Clear   Protein, UA CANCELED    Glucose, UA CANCELED    Ketones, UA CANCELED   BLADDER SCAN AMB NON-IMAGING  Result Value Ref Range   Scan Result 71ml      Pertinent Imaging: N/a  Assessment & Plan:    1. Benign prostatic hyperplasia with urinary frequency Adequate bladder emptying today Continue Flomax and finasteride Records requested from [facility], Dr. Risa Grill specifically - Urinalysis, Complete - BLADDER SCAN AMB NON-IMAGING  2. Dysuria Etiology unclear No evidence of UTI on UA-nitrate positive due to persistent Pyridium use Discussed safety profile of Pyridium, not meant for daily usage Prescribed Uribell to help with this Status post recent cystoscopy to rule out bladder pathology, was negative per patient report, records requested  3. Nocturia Nocturia x2 which is primary bother symptoms I recommended keeping a voiding diary prior to next visit recording  volumes and times of voiding to assess whether this is primary nocturia or urinary frequency related  Return in about 4 weeks (around 01/21/2017) for IPSS, review records, review voiding diary  (please sign release for  .  Hollice Espy, MD  [facility] [address] [place], [place] [address] [phone]

## 2017-01-07 ENCOUNTER — Other Ambulatory Visit: Payer: Self-pay

## 2017-01-07 DIAGNOSIS — R9439 Abnormal result of other cardiovascular function study: Secondary | ICD-10-CM

## 2017-01-07 DIAGNOSIS — R0602 Shortness of breath: Secondary | ICD-10-CM

## 2017-01-07 DIAGNOSIS — R9430 Abnormal result of cardiovascular function study, unspecified: Secondary | ICD-10-CM

## 2017-01-07 NOTE — Progress Notes (Unsigned)
CT ordered by Dr. Ubaldo Glassing at [facility], for Dr. Johnsie Cancel to read.

## 2017-01-11 ENCOUNTER — Encounter: Payer: Self-pay | Admitting: Cardiovascular Disease

## 2017-01-20 ENCOUNTER — Ambulatory Visit: Payer: Medicare Other | Admitting: Urology

## 2017-01-27 ENCOUNTER — Ambulatory Visit (INDEPENDENT_AMBULATORY_CARE_PROVIDER_SITE_OTHER): Payer: Medicare Other | Admitting: Urology

## 2017-01-27 ENCOUNTER — Encounter: Payer: Self-pay | Admitting: Urology

## 2017-01-27 VITALS — BP 92/51 | HR 96 | Ht 75.0 in | Wt 230.0 lb

## 2017-01-27 DIAGNOSIS — N401 Enlarged prostate with lower urinary tract symptoms: Secondary | ICD-10-CM | POA: Diagnosis not present

## 2017-01-27 DIAGNOSIS — R351 Nocturia: Secondary | ICD-10-CM

## 2017-01-27 DIAGNOSIS — R300 Dysuria: Secondary | ICD-10-CM | POA: Diagnosis not present

## 2017-01-27 DIAGNOSIS — R350 Frequency of micturition: Secondary | ICD-10-CM | POA: Diagnosis not present

## 2017-01-27 NOTE — Progress Notes (Signed)
01/27/2017 7:36 PM   Mark [name]. 29-Aug-1940 [id number]  Referring provider: Tracie Harrier, MD [address] [facility] [place], [place] [address]  Chief Complaint  Patient presents with  . Benign Prostatic Hypertrophy    22month    HPI:  76 yo M BPH and chronic dysuria and frequency who presents today for routine follow-up.  Previous records from Dr. Risa Grill were reviewed including recent cystoscopy which was unremarkable.  Today, he reports he is doing much better.  He has minimal voiding symptoms or complaints.  His dysuria has resolved.  His frequency is also reduced.  He is now only getting up on average 1-2 times at night to void.  He has n PSA is as below.  Ot filled his prescription for urinalysis symptoms are minimal.  IPSS as below.    He is currently on finasteride and flomax which has been taking for quite some time.Marland Kitchen He has previously tried Myrbetriq in the past which was not helpful.  PSA 0.64 on 10/11//2017  Voiding diary reviewed today.  He was able to keep a voiding diary for 2 days, 1 daily and 1 weekend.  On the weekday entry, he voided a total of 9 times in a 24-hour period.  Voided volumes ranged from 100 cc to 400 cc.  On this particular night, it looks like he was awoken 3 times during the night time voided volume 560 cc.  Overall urinary volume on this day, 2190 cc.  On the weekend entry, he voided a total of 7 times, only 2 times during the night.  Again, his nighttime fluid volume was 630 cc.  Total urinary volume 1935 cc.     IPSS    Row Name 01/27/17 0900         International Prostate Symptom Score   How often have you had the sensation of not emptying your bladder?  Less than 1 in 5     How often have you had to urinate less than every two hours?  Not at All     How often have you found you stopped and started again several times when you urinated?  Not at All     How often have you found it difficult to postpone urination?   Not at All     How often have you had a weak urinary stream?  Not at All     How often have you had to strain to start urination?  Not at All     How many times did you typically get up at night to urinate?  2 Times     Total IPSS Score  3       Quality of Life due to urinary symptoms   If you were to spend the rest of your life with your urinary condition just the way it is now how would you feel about that?  Pleased        Score:  1-7 Mild 8-19 Moderate 20-35 Severe    PMH: Past Medical History:  Diagnosis Date  . Anxiety   . Atrial fibrillation ([place])   . Back pain, chronic   . Colon polyp   . Cyst of left kidney   . GERD (gastroesophageal reflux disease)   . Heart murmur   . Hyperlipemia   . Mitral valve prolapse   . Neuromuscular disorder ([place])   . Neuropathy   . Osteoarthritis   . Pulmonary emboli ([place])   . Spinal stenosis   .  Thyroid nodule   . Vertigo     Surgical History: Past Surgical History:  Procedure Laterality Date  . APPENDECTOMY    . BACK SURGERY    . TONSILLECTOMY      Home Medications:  Allergies as of 01/27/2017      Reactions   Erythromycin Swelling   Keflex [cephalexin] Swelling   Penicillins       Medication List        Accurate as of 01/27/17 11:59 PM. Always use your most recent med list.          famotidine 10 MG chewable tablet Commonly known as:  PEPCID AC Chew 20 mg by mouth 2 (two) times daily as needed for heartburn.   finasteride 5 MG tablet Commonly known as:  PROSCAR   FLUoxetine 40 MG capsule Commonly known as:  PROZAC Take 40 mg by mouth daily. Mon, tues, thurs, fri   gabapentin 100 MG capsule Commonly known as:  NEURONTIN   nortriptyline 10 MG capsule Commonly known as:  PAMELOR   simvastatin 40 MG tablet Commonly known as:  ZOCOR Take 40 mg by mouth daily.   tamsulosin 0.4 MG Caps capsule Commonly known as:  FLOMAX   traMADol 50 MG tablet Commonly known as:  ULTRAM Take 1 tablet (50 mg total)  by mouth every 6 (six) hours as needed.   URIBEL 118 MG Caps Take 1 capsule (118 mg total) by mouth 4 (four) times daily as needed.   warfarin 5 MG tablet Commonly known as:  COUMADIN Take 5 mg by mouth every evening. 1 tab on Mon, tues, thurs, fri. 1/2 tab on wed sat and sun       Allergies:  Allergies  Allergen Reactions  . Erythromycin Swelling  . Keflex [Cephalexin] Swelling  . Penicillins     Family History: No family history on file.  Social History:  reports that  has never smoked. he has never used smokeless tobacco. He reports that he drinks about 0.6 oz of alcohol per week. His drug history is not on file.  ROS: UROLOGY Frequent Urination?: No Hard to postpone urination?: No Burning/pain with urination?: No Get up at night to urinate?: No Leakage of urine?: No Urine stream starts and stops?: No Trouble starting stream?: No Do you have to strain to urinate?: No Blood in urine?: No Urinary tract infection?: No Sexually transmitted disease?: No Injury to kidneys or bladder?: No Painful intercourse?: No Weak stream?: No Erection problems?: No Penile pain?: No  Gastrointestinal Nausea?: No Vomiting?: No Indigestion/heartburn?: Yes Diarrhea?: No Constipation?: Yes  Constitutional Fever: No Night sweats?: No Weight loss?: No Fatigue?: No  Skin Skin rash/lesions?: No Itching?: No  Eyes Blurred vision?: No Double vision?: No  Ears/Nose/Throat Sore throat?: No Sinus problems?: No  Hematologic/Lymphatic Swollen glands?: No Easy bruising?: No  Cardiovascular Leg swelling?: No Chest pain?: No  Respiratory Cough?: No Shortness of breath?: No  Endocrine Excessive thirst?: No  Musculoskeletal Back pain?: No Joint pain?: No  Neurological Headaches?: No Dizziness?: No  Psychologic Depression?: No Anxiety?: No  Physical Exam: BP (!) 92/51   Pulse 96   Ht 6\' 3"  (1.905 m)   Wt 230 lb (104.3 kg)   BMI 28.75 kg/m     Constitutional:  Alert and oriented, No acute distress.  Wife today. HEENT: [place] AT, moist mucus membranes.  Trachea midline, no masses. Cardiovascular: No clubbing, cyanosis, or edema. Respiratory: Normal respiratory effort, no increased work of breathing. GI: Abdomen is soft, nontender,  nondistended, no abdominal masses GU: No CVA tenderness.  Rectal exam deferred, recently performed by Dr. Risa Grill Skin: No rashes, bruises or suspicious lesions. Neurologic: Grossly intact, no focal deficits, moving all 4 extremities. Psychiatric: Normal mood and affect.  Laboratory Data: Lab Results  Component Value Date   WBC 7.2 07/04/2013   HGB 15.7 07/04/2013   HCT 46.9 07/04/2013   MCV 91 07/04/2013   PLT 187 07/04/2013    Lab Results  Component Value Date   CREATININE 1.00 01/28/2017    Urinalysis Results for orders placed or performed in visit on 12/24/16  Urinalysis, Complete  Result Value Ref Range   Specific Gravity, UA CANCELED    pH, UA CANCELED    Color, UA Orange Yellow   Appearance Ur Clear Clear   Protein, UA CANCELED    Glucose, UA CANCELED    Ketones, UA CANCELED   BLADDER SCAN AMB NON-IMAGING  Result Value Ref Range   Scan Result 70ml      Pertinent Imaging: N/a  Assessment & Plan:    1. Benign prostatic hyperplasia with urinary frequency Adequate bladder emptying today Symptoms dramatically improved Continue Flomax and finasteride Records reviewed, extensive inadequate workup at this point - Urinalysis, Complete - BLADDER SCAN AMB NON-IMAGING  2. Dysuria Etiology unclear, chronic Improved Urobelllprescription never filled, to be used as needed  3. Nocturia Nocturia x2  Voiding diary reviewed, does not appear to have primary internal polyuria Discussed behavioral modification  Return in about 1 year (around 01/27/2018) for IPSS, PVR.  Hollice Espy, MD  [facility] [address], [place], [place]  [address] [phone]  I spent 25 min with this patient of which greater than 50% was spent in counseling and coordination of care with the patient.

## 2017-01-28 ENCOUNTER — Ambulatory Visit (HOSPITAL_COMMUNITY): Payer: Medicare Other

## 2017-01-28 ENCOUNTER — Ambulatory Visit (HOSPITAL_COMMUNITY)
Admission: RE | Admit: 2017-01-28 | Discharge: 2017-01-28 | Disposition: A | Discharge status: Home / Self Care | Payer: Medicare Other | Source: Ambulatory Visit | Attending: Cardiovascular Disease | Admitting: Cardiovascular Disease

## 2017-01-28 DIAGNOSIS — I2584 Coronary atherosclerosis due to calcified coronary lesion: Secondary | ICD-10-CM | POA: Insufficient documentation

## 2017-01-28 DIAGNOSIS — R0602 Shortness of breath: Secondary | ICD-10-CM

## 2017-01-28 DIAGNOSIS — R9430 Abnormal result of cardiovascular function study, unspecified: Secondary | ICD-10-CM

## 2017-01-28 DIAGNOSIS — R9439 Abnormal result of other cardiovascular function study: Secondary | ICD-10-CM

## 2017-01-28 DIAGNOSIS — R079 Chest pain, unspecified: Secondary | ICD-10-CM | POA: Diagnosis not present

## 2017-01-28 LAB — POCT I-STAT CREATININE: Creatinine, Ser: 1 mg/dL (ref 0.61–1.24)

## 2017-01-28 MED ORDER — IOPAMIDOL (ISOVUE-370) INJECTION 76%
INTRAVENOUS | Status: AC
  Administered 2017-01-28: 100 mL
  Filled 2017-01-28: qty 100

## 2017-01-28 MED ORDER — NITROGLYCERIN 0.4 MG SL SUBL
0.8000 mg | SUBLINGUAL_TABLET | Freq: Once | SUBLINGUAL | Status: AC
  Administered 2017-01-28: 0.8 mg via SUBLINGUAL
  Filled 2017-01-28: qty 25

## 2017-01-28 MED ORDER — NITROGLYCERIN 0.4 MG SL SUBL
SUBLINGUAL_TABLET | SUBLINGUAL | Status: AC
  Administered 2017-01-28: 0.8 mg via SUBLINGUAL
  Filled 2017-01-28: qty 2

## 2017-01-28 MED ORDER — METOPROLOL TARTRATE 5 MG/5ML IV SOLN
5.0000 mg | INTRAVENOUS | Status: DC | PRN
  Administered 2017-01-28 (×2): 5 mg via INTRAVENOUS
  Filled 2017-01-28: qty 5

## 2017-01-28 MED ORDER — METOPROLOL TARTRATE 5 MG/5ML IV SOLN
INTRAVENOUS | Status: AC
Start: 2017-01-28 — End: 2017-01-28
  Administered 2017-01-28: 5 mg via INTRAVENOUS
  Filled 2017-01-28: qty 15

## 2017-08-12 DIAGNOSIS — Z86711 Personal history of pulmonary embolism: Secondary | ICD-10-CM | POA: Insufficient documentation

## 2017-08-12 DIAGNOSIS — M48062 Spinal stenosis, lumbar region with neurogenic claudication: Secondary | ICD-10-CM | POA: Insufficient documentation

## 2017-08-12 DIAGNOSIS — I480 Paroxysmal atrial fibrillation: Secondary | ICD-10-CM | POA: Insufficient documentation

## 2017-10-23 ENCOUNTER — Emergency Department: Payer: Medicare Other

## 2017-10-23 ENCOUNTER — Other Ambulatory Visit: Payer: Self-pay

## 2017-10-23 ENCOUNTER — Inpatient Hospital Stay
Admission: EM | Admit: 2017-10-23 | Discharge: 2017-10-25 | DRG: 312 | Disposition: A | Discharge status: Home / Self Care | Payer: Medicare Other | Attending: Internal Medicine | Admitting: Internal Medicine

## 2017-10-23 DIAGNOSIS — K219 Gastro-esophageal reflux disease without esophagitis: Secondary | ICD-10-CM | POA: Diagnosis present

## 2017-10-23 DIAGNOSIS — E785 Hyperlipidemia, unspecified: Secondary | ICD-10-CM | POA: Diagnosis present

## 2017-10-23 DIAGNOSIS — G8929 Other chronic pain: Secondary | ICD-10-CM | POA: Diagnosis present

## 2017-10-23 DIAGNOSIS — D72829 Elevated white blood cell count, unspecified: Secondary | ICD-10-CM | POA: Diagnosis present

## 2017-10-23 DIAGNOSIS — I951 Orthostatic hypotension: Secondary | ICD-10-CM | POA: Diagnosis present

## 2017-10-23 DIAGNOSIS — R000 Tachycardia, unspecified: Secondary | ICD-10-CM

## 2017-10-23 DIAGNOSIS — M461 Sacroiliitis, not elsewhere classified: Secondary | ICD-10-CM | POA: Diagnosis present

## 2017-10-23 DIAGNOSIS — R42 Dizziness and giddiness: Secondary | ICD-10-CM

## 2017-10-23 DIAGNOSIS — N400 Enlarged prostate without lower urinary tract symptoms: Secondary | ICD-10-CM | POA: Diagnosis present

## 2017-10-23 DIAGNOSIS — Z79899 Other long term (current) drug therapy: Secondary | ICD-10-CM

## 2017-10-23 DIAGNOSIS — E860 Dehydration: Secondary | ICD-10-CM | POA: Diagnosis present

## 2017-10-23 DIAGNOSIS — I129 Hypertensive chronic kidney disease with stage 1 through stage 4 chronic kidney disease, or unspecified chronic kidney disease: Secondary | ICD-10-CM | POA: Diagnosis present

## 2017-10-23 DIAGNOSIS — I341 Nonrheumatic mitral (valve) prolapse: Secondary | ICD-10-CM | POA: Diagnosis present

## 2017-10-23 DIAGNOSIS — N183 Chronic kidney disease, stage 3 (moderate): Secondary | ICD-10-CM | POA: Diagnosis present

## 2017-10-23 DIAGNOSIS — Z888 Allergy status to other drugs, medicaments and biological substances status: Secondary | ICD-10-CM

## 2017-10-23 DIAGNOSIS — Z86711 Personal history of pulmonary embolism: Secondary | ICD-10-CM

## 2017-10-23 DIAGNOSIS — Z7901 Long term (current) use of anticoagulants: Secondary | ICD-10-CM

## 2017-10-23 DIAGNOSIS — I480 Paroxysmal atrial fibrillation: Secondary | ICD-10-CM | POA: Diagnosis present

## 2017-10-23 DIAGNOSIS — Z881 Allergy status to other antibiotic agents status: Secondary | ICD-10-CM

## 2017-10-23 DIAGNOSIS — Z880 Allergy status to penicillin: Secondary | ICD-10-CM

## 2017-10-23 DIAGNOSIS — R739 Hyperglycemia, unspecified: Secondary | ICD-10-CM | POA: Diagnosis present

## 2017-10-23 DIAGNOSIS — S7002XA Contusion of left hip, initial encounter: Secondary | ICD-10-CM

## 2017-10-23 DIAGNOSIS — D6851 Activated protein C resistance: Secondary | ICD-10-CM | POA: Diagnosis present

## 2017-10-23 DIAGNOSIS — F419 Anxiety disorder, unspecified: Secondary | ICD-10-CM | POA: Diagnosis present

## 2017-10-23 DIAGNOSIS — G629 Polyneuropathy, unspecified: Secondary | ICD-10-CM | POA: Diagnosis present

## 2017-10-23 DIAGNOSIS — R55 Syncope and collapse: Secondary | ICD-10-CM | POA: Diagnosis present

## 2017-10-23 DIAGNOSIS — F329 Major depressive disorder, single episode, unspecified: Secondary | ICD-10-CM | POA: Diagnosis present

## 2017-10-23 LAB — BASIC METABOLIC PANEL
Anion gap: 7 (ref 5–15)
BUN: 17 mg/dL (ref 8–23)
CHLORIDE: 108 mmol/L (ref 98–111)
CO2: 25 mmol/L (ref 22–32)
Calcium: 8.3 mg/dL — ABNORMAL LOW (ref 8.9–10.3)
Creatinine, Ser: 1.27 mg/dL — ABNORMAL HIGH (ref 0.61–1.24)
GFR calc non Af Amer: 52 mL/min — ABNORMAL LOW (ref >60)
Glucose, Bld: 166 mg/dL — ABNORMAL HIGH (ref 70–99)
POTASSIUM: 3.9 mmol/L (ref 3.5–5.1)
SODIUM: 140 mmol/L (ref 135–145)

## 2017-10-23 LAB — CBC
HEMATOCRIT: 41.2 % (ref 40.0–52.0)
HEMOGLOBIN: 14.2 g/dL (ref 13.0–18.0)
MCH: 32 pg (ref 26.0–34.0)
MCHC: 34.4 g/dL (ref 32.0–36.0)
MCV: 92.8 fL (ref 80.0–100.0)
Platelets: 217 10*3/uL (ref 150–440)
RBC: 4.44 MIL/uL (ref 4.40–5.90)
RDW: 13.5 % (ref 11.5–14.5)
WBC: 11.5 10*3/uL — ABNORMAL HIGH (ref 3.8–10.6)

## 2017-10-23 LAB — TROPONIN I: Troponin I: <0.03 ng/mL (ref <0.03)

## 2017-10-23 MED ORDER — SODIUM CHLORIDE 0.9 % IV BOLUS
500.0000 mL | Freq: Once | INTRAVENOUS | Status: AC
  Administered 2017-10-23: 500 mL via INTRAVENOUS

## 2017-10-23 NOTE — ED Provider Notes (Signed)
[facility] Emergency Department Provider Note ____________________________________________   First MD Initiated Contact with Patient 10/23/17 2150     (approximate)  I have reviewed the triage vital signs and the nursing notes.   HISTORY  Chief Complaint Dizziness and Weakness    HPI Mark [name]. is a 77 y.o. male with PMH as noted below who presents with dizziness, gradual onset over the last several weeks, and worse today.  He describes it as feeling like he is going to pass out, and is worse when he gets up and tries to walk.  The patient reports that today he also had a fall after getting up out of bed during the night, in which he hit his left hip and arm.  He denies hitting his head, denies LOC.  The patient denies chest pain, vomiting, fever, or urinary symptoms.  He has been able to ambulate since the fall.    Past Medical History:  Diagnosis Date  . Anxiety   . Atrial fibrillation ([place])   . Back pain, chronic   . Colon polyp   . Cyst of left kidney   . GERD (gastroesophageal reflux disease)   . Heart murmur   . Hyperlipemia   . Mitral valve prolapse   . Neuromuscular disorder ([place])   . Neuropathy   . Osteoarthritis   . Pulmonary emboli ([place])   . Spinal stenosis   . Thyroid nodule   . Vertigo     Patient Active Problem List   Diagnosis Date Noted  . Abnormal cardiovascular function study 01/07/2017    Past Surgical History:  Procedure Laterality Date  . APPENDECTOMY    . BACK SURGERY    . COLONOSCOPY N/A 08/15/2014   Procedure: COLONOSCOPY;  Surgeon: [name], MD;  Location: [facility] ENDOSCOPY;  Service: Endoscopy;  Laterality: N/A;  . ESOPHAGOGASTRODUODENOSCOPY N/A 08/15/2014   Procedure: ESOPHAGOGASTRODUODENOSCOPY (EGD);  Surgeon: [name], MD;  Location: [facility] ENDOSCOPY;  Service: Endoscopy;  Laterality: N/A;  . TONSILLECTOMY      Prior to Admission medications   Medication Sig Start Date End Date Taking?  Authorizing Provider  famotidine (PEPCID AC) 10 MG chewable tablet Chew 20 mg by mouth 2 (two) times daily as needed for heartburn.    [provider]  finasteride (PROSCAR) 5 MG tablet  12/01/16   [provider]  FLUoxetine (PROZAC) 40 MG capsule Take 40 mg by mouth daily. Sammuel Cooper, thurs, fri    [provider]  gabapentin (NEURONTIN) 100 MG capsule  12/23/16   [provider]  Meth-Hyo-M Bl-Na Phos-Ph Sal (URIBEL) 118 MG CAPS Take 1 capsule (118 mg total) by mouth 4 (four) times daily as needed. 12/24/16   Hollice Espy, MD  nortriptyline (PAMELOR) 10 MG capsule  12/23/16   [provider]  simvastatin (ZOCOR) 40 MG tablet Take 40 mg by mouth daily.    [provider]  tamsulosin (FLOMAX) 0.4 MG CAPS capsule  12/14/16   [provider]  warfarin (COUMADIN) 5 MG tablet Take 5 mg by mouth every evening. 1 tab on Mon, tues, thurs, fri. 1/2 tab on wed sat and sun    [provider]    Allergies Erythromycin; Keflex [cephalexin]; and Penicillins  No family history on file.  Social History Social History   Tobacco Use  . Smoking status: Never Smoker  . Smokeless tobacco: Never Used  Substance Use Topics  . Alcohol use: Yes    Alcohol/week: 0.6  oz    Types: 1 Glasses of wine per week  . Drug use: Not on file    Review of Systems  Constitutional: No fever Eyes: No redness. ENT: No sore throat.  No neck pain. Cardiovascular: Denies chest pain. Respiratory: Denies shortness of breath. Gastrointestinal: No vomiting. Genitourinary: Negative for dysuria.  Musculoskeletal: Negative for back pain. Skin: Negative for rash. Neurological: Negative for headaches, focal weakness or numbness.   ____________________________________________   PHYSICAL EXAM:  VITAL SIGNS: ED Triage Vitals  Enc Vitals Group     BP 10/23/17 1836 103/70     Pulse Rate 10/23/17 1836 (!) 111     Resp 10/23/17 1836 18     Temp 10/23/17  1836 98.4 F (36.9 C)     Temp Source 10/23/17 1836 Oral     SpO2 10/23/17 1836 98 %     Weight 10/23/17 1835 230 lb (104.3 kg)     Height 10/23/17 1835 6\' 3"  (1.905 m)     Head Circumference --      Peak Flow --      Pain Score 10/23/17 1835 0     Pain Loc --      Pain Edu? --      Excl. in [place]? --     Constitutional: Alert and oriented. Well appearing for age and in no acute distress. Eyes: Conjunctivae are normal.  EOMI.  PERRLA. Head: Atraumatic. Nose: No congestion/rhinnorhea. Mouth/Throat: Mucous membranes are slightly dry.   Neck: Normal range of motion.  No midline cervical spinal tenderness. Cardiovascular: Normal rate, regular rhythm. Grossly normal heart sounds.  Good peripheral circulation. Respiratory: Normal respiratory effort.  No retractions. Lungs CTAB. Gastrointestinal: Soft and nontender. No distention.  Genitourinary: No flank mild tenderness to left hip, but full range of motion and no deformity.  Tenderness. Musculoskeletal: No lower extremity edema.  Extremities warm and well perfused.  Neurologic:  Normal speech and language.  Motor and sensory intact in all extremities.  Normal coordination.  No ataxia.  No gross focal neurologic deficits are appreciated.  Skin:  Skin is warm and dry. No rash noted. Psychiatric: Mood and affect are normal. Speech and behavior are normal.  ____________________________________________   LABS (all labs ordered are listed, but only abnormal results are displayed)  Labs Reviewed  BASIC METABOLIC PANEL - Abnormal; Notable for the following components:      Result Value   Glucose, Bld 166 (*)    Creatinine, Ser 1.27 (*)    Calcium 8.3 (*)    GFR calc non Af Amer 52 (*)    All other components within normal limits  CBC - Abnormal; Notable for the following components:   WBC 11.5 (*)    All other components within normal limits  TROPONIN I  URINALYSIS, COMPLETE (UACMP) WITH MICROSCOPIC    ____________________________________________  EKG  ED ECG REPORT I, Arta Silence, the attending physician, personally viewed and interpreted this ECG.  Date: 10/23/2017 EKG Time: 1832 Rate: 114 Rhythm: Sinus tachycardia QRS Axis: normal Intervals: LAFB ST/T Wave abnormalities: normal Narrative Interpretation: no evidence of acute ischemia  ____________________________________________  RADIOLOGY  XR L hip: Possible nondisplaced left femoral neck fracture CXR: No focal infiltrate or other acute findings CT L hip: No acute fracture  ____________________________________________   PROCEDURES  Procedure(s) performed: No  Procedures  Critical Care performed: No ____________________________________________   INITIAL IMPRESSION / ASSESSMENT AND PLAN / ED COURSE  Pertinent labs & imaging results that were available during my care of  the patient were reviewed by me and considered in my medical decision making (see chart for details).  77 year old male with PMH as noted above presents with dizziness, described as a lightheaded or near syncopal feeling which is been occurring for some time but worse in the last 1 to 2 days and especially this evening.  Patient also reports a fall from standing height which appears to be mechanical and occurred when he was getting up to 10 his dog this morning.  On exam, the patient is remarkably well-appearing.  He is tachycardic but his other vital signs are normal.  There is no visible trauma, and he does have good range of motion to the left hip.  Neuro exam is nonfocal.  Terms of fall, overall I have low suspicion for hip fracture given that the patient has been bearing weight.  There is no evidence of head injury.  The patient is on Coumadin for factor V Leiden, however he does not require brain imaging since he did not hit his head.  Differential for the nonspecific lightheadedness is broad, but includes dehydration, UTI or other  infection, metabolic etiology, medication side effect, or less likely cardiac cause.  We will obtain work-up for these causes, give fluids, and reassess.  Clinical Course as of Oct 24 5  Sat Oct 23, 2017  2208 RBC: 4.44 [SS]  2345 Glucose(!): 166 [SS]    Clinical Course User Index [SS] Arta Silence, MD   ----------------------------------------- 12:05 AM on 10/24/2017 -----------------------------------------  Lab work-up is unremarkable except for minimally elevated white count.  The patient's UA is still pending.  Chest x-ray shows no significant acute findings.  X-ray of the left hip showed finding concerning for possible nondisplaced fracture although I had a low suspicion for this given that the patient is bearing weight without difficulty.  CT was obtained and is negative.  Patient remains tachycardic to approximately 110-115.  At this time the plan will be to follow-up on the UA, give 500 cc additional fluids, and reassess.  If the patient is still significantly dizzy or near syncopal, or if he has a UTI,he may require admission.  If he is feeling better he may be discharged home.  I am signing the patient out to the oncoming physician Dr. Beather Arbour.  ____________________________________________   FINAL CLINICAL IMPRESSION(S) / ED DIAGNOSES  Final diagnoses:  Lightheadedness  Tachycardia  Contusion of left hip, initial encounter      NEW MEDICATIONS STARTED DURING THIS VISIT:  New Prescriptions   No medications on file     Note:  This document was prepared using Dragon voice recognition software and may include unintentional dictation errors.    Arta Silence, MD 10/24/17 [phone]

## 2017-10-23 NOTE — ED Triage Notes (Signed)
Patient reports having dizziness, especially with position change for couple weeks, worse today.

## 2017-10-24 ENCOUNTER — Encounter: Payer: Self-pay | Admitting: Internal Medicine

## 2017-10-24 ENCOUNTER — Observation Stay
Admit: 2017-10-24 | Discharge: 2017-10-24 | Disposition: A | Discharge status: Home / Self Care | Payer: Medicare Other | Attending: Internal Medicine | Admitting: Internal Medicine

## 2017-10-24 ENCOUNTER — Other Ambulatory Visit: Payer: Self-pay

## 2017-10-24 DIAGNOSIS — I341 Nonrheumatic mitral (valve) prolapse: Secondary | ICD-10-CM | POA: Diagnosis present

## 2017-10-24 DIAGNOSIS — R42 Dizziness and giddiness: Secondary | ICD-10-CM | POA: Diagnosis present

## 2017-10-24 DIAGNOSIS — E785 Hyperlipidemia, unspecified: Secondary | ICD-10-CM | POA: Diagnosis present

## 2017-10-24 DIAGNOSIS — R739 Hyperglycemia, unspecified: Secondary | ICD-10-CM | POA: Diagnosis present

## 2017-10-24 DIAGNOSIS — I129 Hypertensive chronic kidney disease with stage 1 through stage 4 chronic kidney disease, or unspecified chronic kidney disease: Secondary | ICD-10-CM | POA: Diagnosis present

## 2017-10-24 DIAGNOSIS — F329 Major depressive disorder, single episode, unspecified: Secondary | ICD-10-CM | POA: Diagnosis present

## 2017-10-24 DIAGNOSIS — N400 Enlarged prostate without lower urinary tract symptoms: Secondary | ICD-10-CM | POA: Diagnosis present

## 2017-10-24 DIAGNOSIS — G8929 Other chronic pain: Secondary | ICD-10-CM | POA: Diagnosis present

## 2017-10-24 DIAGNOSIS — D6851 Activated protein C resistance: Secondary | ICD-10-CM | POA: Diagnosis present

## 2017-10-24 DIAGNOSIS — R55 Syncope and collapse: Secondary | ICD-10-CM | POA: Diagnosis present

## 2017-10-24 DIAGNOSIS — F419 Anxiety disorder, unspecified: Secondary | ICD-10-CM | POA: Diagnosis present

## 2017-10-24 DIAGNOSIS — G629 Polyneuropathy, unspecified: Secondary | ICD-10-CM | POA: Diagnosis present

## 2017-10-24 DIAGNOSIS — Z79899 Other long term (current) drug therapy: Secondary | ICD-10-CM | POA: Diagnosis not present

## 2017-10-24 DIAGNOSIS — Z7901 Long term (current) use of anticoagulants: Secondary | ICD-10-CM | POA: Diagnosis not present

## 2017-10-24 DIAGNOSIS — I951 Orthostatic hypotension: Secondary | ICD-10-CM | POA: Diagnosis present

## 2017-10-24 DIAGNOSIS — K219 Gastro-esophageal reflux disease without esophagitis: Secondary | ICD-10-CM | POA: Diagnosis present

## 2017-10-24 DIAGNOSIS — Z888 Allergy status to other drugs, medicaments and biological substances status: Secondary | ICD-10-CM | POA: Diagnosis not present

## 2017-10-24 DIAGNOSIS — I480 Paroxysmal atrial fibrillation: Secondary | ICD-10-CM | POA: Diagnosis present

## 2017-10-24 DIAGNOSIS — Z881 Allergy status to other antibiotic agents status: Secondary | ICD-10-CM | POA: Diagnosis not present

## 2017-10-24 DIAGNOSIS — Z86711 Personal history of pulmonary embolism: Secondary | ICD-10-CM | POA: Diagnosis not present

## 2017-10-24 DIAGNOSIS — N183 Chronic kidney disease, stage 3 (moderate): Secondary | ICD-10-CM | POA: Diagnosis present

## 2017-10-24 DIAGNOSIS — Z880 Allergy status to penicillin: Secondary | ICD-10-CM | POA: Diagnosis not present

## 2017-10-24 DIAGNOSIS — D72829 Elevated white blood cell count, unspecified: Secondary | ICD-10-CM | POA: Diagnosis present

## 2017-10-24 DIAGNOSIS — E860 Dehydration: Secondary | ICD-10-CM | POA: Diagnosis present

## 2017-10-24 DIAGNOSIS — M461 Sacroiliitis, not elsewhere classified: Secondary | ICD-10-CM | POA: Diagnosis present

## 2017-10-24 DIAGNOSIS — R9430 Abnormal result of cardiovascular function study, unspecified: Secondary | ICD-10-CM

## 2017-10-24 LAB — TROPONIN I
Troponin I: <0.03 ng/mL (ref <0.03)
Troponin I: <0.03 ng/mL (ref <0.03)

## 2017-10-24 LAB — GLUCOSE, CAPILLARY
GLUCOSE-CAPILLARY: 149 mg/dL — AB (ref 70–99)
GLUCOSE-CAPILLARY: 112 mg/dL — AB (ref 70–99)
Glucose-Capillary: 160 mg/dL — ABNORMAL HIGH (ref 70–99)
Glucose-Capillary: 120 mg/dL — ABNORMAL HIGH (ref 70–99)

## 2017-10-24 LAB — URINALYSIS, COMPLETE (UACMP) WITH MICROSCOPIC
BILIRUBIN URINE: NEGATIVE
Glucose, UA: NEGATIVE mg/dL
Hgb urine dipstick: NEGATIVE
Ketones, ur: 5 mg/dL — AB
Leukocytes, UA: NEGATIVE
Nitrite: NEGATIVE
PH: 5 (ref 5.0–8.0)
Protein, ur: 30 mg/dL — AB
SPECIFIC GRAVITY, URINE: 1.031 — AB (ref 1.005–1.030)

## 2017-10-24 LAB — ECHOCARDIOGRAM COMPLETE
Height: 75 in
Weight: 3300.8 oz

## 2017-10-24 LAB — PROTIME-INR
INR: 3.06
INR: 2.78
Prothrombin Time: 31.4 seconds — ABNORMAL HIGH (ref 11.4–15.2)
Prothrombin Time: 29.1 seconds — ABNORMAL HIGH (ref 11.4–15.2)

## 2017-10-24 MED ORDER — ASPIRIN 81 MG PO CHEW
81.0000 mg | CHEWABLE_TABLET | Freq: Every day | ORAL | Status: DC
  Administered 2017-10-25: 81 mg via ORAL
  Filled 2017-10-24 (×2): qty 1

## 2017-10-24 MED ORDER — FAMOTIDINE 20 MG PO TABS: 20.0000 mg | ORAL_TABLET | Freq: Two times a day (BID) | ORAL | Status: DC | PRN

## 2017-10-24 MED ORDER — ACETAMINOPHEN 650 MG RE SUPP: 650.0000 mg | Freq: Four times a day (QID) | RECTAL | Status: DC | PRN

## 2017-10-24 MED ORDER — FLUOXETINE HCL 20 MG PO CAPS
40.0000 mg | ORAL_CAPSULE | ORAL | Status: DC
  Administered 2017-10-25: 40 mg via ORAL
  Filled 2017-10-24: qty 2

## 2017-10-24 MED ORDER — LACTATED RINGERS IV SOLN
INTRAVENOUS | Status: AC
  Administered 2017-10-24: 03:00:00 via INTRAVENOUS

## 2017-10-24 MED ORDER — BISACODYL 5 MG PO TBEC: 5.0000 mg | DELAYED_RELEASE_TABLET | Freq: Every day | ORAL | Status: DC | PRN

## 2017-10-24 MED ORDER — SODIUM CHLORIDE 0.9% FLUSH
3.0000 mL | Freq: Two times a day (BID) | INTRAVENOUS | Status: DC
  Administered 2017-10-24 – 2017-10-25 (×3): 3 mL via INTRAVENOUS

## 2017-10-24 MED ORDER — ONDANSETRON HCL 4 MG/2ML IJ SOLN: 4.0000 mg | Freq: Four times a day (QID) | INTRAMUSCULAR | Status: DC | PRN

## 2017-10-24 MED ORDER — ALUM & MAG HYDROXIDE-SIMETH 200-200-20 MG/5ML PO SUSP
30.0000 mL | Freq: Four times a day (QID) | ORAL | Status: DC | PRN
  Administered 2017-10-24 (×2): 30 mL via ORAL
  Filled 2017-10-24 (×2): qty 30

## 2017-10-24 MED ORDER — ACETAMINOPHEN 325 MG PO TABS
650.0000 mg | ORAL_TABLET | Freq: Four times a day (QID) | ORAL | Status: DC | PRN
  Administered 2017-10-24 (×2): 650 mg via ORAL
  Filled 2017-10-24 (×2): qty 2

## 2017-10-24 MED ORDER — FINASTERIDE 5 MG PO TABS
5.0000 mg | ORAL_TABLET | Freq: Every day | ORAL | Status: DC
  Administered 2017-10-24 – 2017-10-25 (×2): 5 mg via ORAL
  Filled 2017-10-24 (×2): qty 1

## 2017-10-24 MED ORDER — SIMVASTATIN 20 MG PO TABS
40.0000 mg | ORAL_TABLET | Freq: Every day | ORAL | Status: DC
  Administered 2017-10-24: 40 mg via ORAL
  Filled 2017-10-24: qty 2

## 2017-10-24 MED ORDER — ONDANSETRON HCL 4 MG PO TABS: 4.0000 mg | ORAL_TABLET | Freq: Four times a day (QID) | ORAL | Status: DC | PRN

## 2017-10-24 MED ORDER — SODIUM CHLORIDE 0.9 % IV BOLUS
500.0000 mL | Freq: Once | INTRAVENOUS | Status: AC
  Administered 2017-10-24: 500 mL via INTRAVENOUS

## 2017-10-24 MED ORDER — WARFARIN SODIUM 2.5 MG PO TABS
2.5000 mg | ORAL_TABLET | ORAL | Status: DC
  Administered 2017-10-24: 2.5 mg via ORAL
  Filled 2017-10-24: qty 1

## 2017-10-24 MED ORDER — PERFLUTREN LIPID MICROSPHERE
1.0000 mL | INTRAVENOUS | Status: AC | PRN
  Administered 2017-10-24: 4 mL via INTRAVENOUS
  Filled 2017-10-24: qty 10

## 2017-10-24 MED ORDER — SENNOSIDES-DOCUSATE SODIUM 8.6-50 MG PO TABS: 1.0000 | ORAL_TABLET | Freq: Every evening | ORAL | Status: DC | PRN

## 2017-10-24 MED ORDER — WARFARIN SODIUM 5 MG PO TABS
5.0000 mg | ORAL_TABLET | ORAL | Status: DC
  Filled 2017-10-24: qty 1

## 2017-10-24 NOTE — Plan of Care (Signed)
Patient admitted about 0330. Patient profile completed. Patient is complaining of left hip pain due to fall. Patient is very dizzy with standing. Discussed safety measures with patient and spouse. Patient is in the agreement with plan of care.

## 2017-10-24 NOTE — ED Provider Notes (Signed)
-----------------------------------------   1:27 AM on 10/24/2017 -----------------------------------------  Patient was extremely orthostatic dizzy upon standing.  Will discuss with hospitalist to evaluate patient in the emergency department for admission.   Paulette Blanch, MD 10/24/17 0700

## 2017-10-24 NOTE — Progress Notes (Signed)
*  PRELIMINARY RESULTS* Echocardiogram 2D Echocardiogram has been performed. Definity IV Contrast used on this study.  Mark [name] S Mark [name] 10/24/2017, 10:14 AM

## 2017-10-24 NOTE — Progress Notes (Signed)
Patient ID: Mark [name]., male   DOB: 1940-09-20, 77 y.o.   MRN: [id number]  [employer] [facility] PROGRESS NOTE  Mark [name]. GGE:[id number] DOB: Aug 16, 1940 DOA: 10/23/2017 PCP: Tracie Harrier, MD  HPI/Subjective: Patient states that he has been having dizziness going on for a few weeks.  He also had a slip and fall and hurt his left hip.  Yesterday he just had continuous dizziness and came in to the hospital.  He states that his normal heart rate is between 60 and 70 and and recently it has been from 100 212.  Objective: Vitals:   10/24/17 0235 10/24/17 0754  BP: 139/90 117/77  Pulse: (!) 111 (!) 102  Resp: 18 16  Temp: 97.7 F (36.5 C) 98.4 F (36.9 C)  SpO2: 97% 97%    Filed Weights   10/23/17 1835 10/24/17 0235  Weight: 104.3 kg (230 lb) 93.6 kg (206 lb 4.8 oz)    ROS: Review of Systems  Constitutional: Negative for chills and fever.  Eyes: Negative for blurred vision.  Respiratory: Negative for cough and shortness of breath.   Cardiovascular: Negative for chest pain.  Gastrointestinal: Negative for abdominal pain, constipation, diarrhea, nausea and vomiting.  Genitourinary: Negative for dysuria.  Musculoskeletal: Negative for joint pain.  Neurological: Negative for dizziness and headaches.   Exam: Physical Exam  Constitutional: He is oriented to person, place, and time.  HENT:  Nose: No mucosal edema.  Mouth/Throat: No oropharyngeal exudate or posterior oropharyngeal edema.  Eyes: Pupils are equal, round, and reactive to light. Conjunctivae, EOM and lids are normal.  Neck: No JVD present. Carotid bruit is not present. No edema present. No thyroid mass and no thyromegaly present.  Cardiovascular: S1 normal and S2 normal. Exam reveals no gallop.  No murmur heard. Pulses:      Dorsalis pedis pulses are 2+ on the right side, and 2+ on the left side.  Respiratory: No respiratory distress. He has no wheezes. He has no rhonchi. He has no rales.  GI: Soft. Bowel  sounds are normal. There is no tenderness.  Musculoskeletal:       Right ankle: He exhibits swelling.       Left ankle: He exhibits swelling.  Slight pain in the left sacroiliac area to palpation.  Lymphadenopathy:    He has no cervical adenopathy.  Neurological: He is alert and oriented to person, place, and time. No cranial nerve deficit.  Skin: Skin is warm. No rash noted. Nails show no clubbing.  Psychiatric: He has a normal mood and affect.      Data Reviewed: Basic Metabolic Panel: Recent Labs  Lab 10/23/17 1839  NA 140  K 3.9  CL 108  CO2 25  GLUCOSE 166*  BUN 17  CREATININE 1.27*  CALCIUM 8.3*   CBC: Recent Labs  Lab 10/23/17 1839  WBC 11.5*  HGB 14.2  HCT 41.2  MCV 92.8  PLT 217   Cardiac Enzymes: Recent Labs  Lab 10/23/17 2212 10/24/17 0508 10/24/17 1100  TROPONINI <0.03 <0.03 <0.03    CBG: Recent Labs  Lab 10/24/17 0750 10/24/17 1138  GLUCAP 149* 160*     Studies: Ct Hip Left Wo Contrast  Result Date: 10/23/2017 CLINICAL DATA:  LEFT hip pain post fall this morning, suspicious radiographs EXAM: CT OF THE LEFT HIP WITHOUT CONTRAST TECHNIQUE: Multidetector CT imaging of the left hip was performed according to the standard protocol. Multiplanar CT image reconstructions were also generated. COMPARISON:  None FINDINGS: Bones/Joint/Cartilage Diffuse osseous  demineralization. Minimal hip joint space narrowing. Visualized LEFT hemipelvis intact. Proximal LEFT femur appears intact. No femoral fracture or dislocation identified. Small spur identified at the anterior aspect of the LEFT femoral head/neck junction. No joint effusion. Ligaments Suboptimally assessed by CT. Muscles and Tendons Muscular planes are normal appearance. Soft tissues Soft tissues unremarkable. IMPRESSION: No LEFT femoral neck fracture identified. Electronically Signed   By: Lavonia Dana M.D.   On: 10/23/2017 23:56   Dg Chest Portable 1 View  Result Date: 10/23/2017 CLINICAL DATA:  77  y/o  M; tachycardia.  Shortness of breath. EXAM: PORTABLE CHEST 1 VIEW COMPARISON:  12/02/2004 chest radiographs. FINDINGS: The heart size and mediastinal contours are within normal limits. Both lungs are clear. Mild multilevel degenerative changes of the thoracic spine. No acute osseous abnormality is evident. IMPRESSION: No active disease. Electronically Signed   By: Kristine Garbe M.D.   On: 10/23/2017 23:21   Dg Hip Unilat W Or Wo Pelvis 2-3 Views Left  Result Date: 10/23/2017 CLINICAL DATA:  Fall with left hip pain. EXAM: DG HIP (WITH OR WITHOUT PELVIS) 2-3V LEFT COMPARISON:  02/25/2016 FINDINGS: Views of the left hip show frontal pelvis with AP and frog-leg lateral diffuse demineralization. Frog-leg lateral view reveals a band of increased density in the femoral neck with associated cortical off step. Imaging features are suspicious for cortical and trabecular disruption from nondisplaced femoral neck fracture. IMPRESSION: Frog-leg lateral view suspicious for nondisplaced femoral neck fracture. CT or MRI could be used to further evaluate. Electronically Signed   By: Misty Stanley M.D.   On: 10/23/2017 22:58    Scheduled Meds: . aspirin  81 mg Oral Daily  . finasteride  5 mg Oral Daily  . [START ON 10/25/2017] FLUoxetine  40 mg Oral Once per day on Mon Tue Thu Fri  . simvastatin  40 mg Oral Daily  . sodium chloride flush  3 mL Intravenous Q12H  . warfarin  2.5 mg Oral Once per day on Sun Wed Sat  . [START ON 10/25/2017] warfarin  5 mg Oral Once per day on Mon Tue Thu Fri   Continuous Infusions:  Assessment/Plan:  1. Orthostatic hypotension.  IV fluid hydration and hold Flomax.  Blood pressure dropped into the 60s with standing.  May take a few days for Flomax to get out of the system.  Change to inpatient status. 2. 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 3. BPH on finasteride.  Hold Flomax. 4. Hyperlipidemia unspecified on simvastatin 5. Left sacroiliitis.   Continue aspirin for now 6. Chronic back pain 7. Depression on fluoxetine  Code Status:     Code Status Orders  (From admission, onward)        Start     Ordered   10/24/17 0236  Full code  Continuous     10/24/17 0235    Code Status History    This patient has a current code status but no historical code status.    Advance Directive Documentation     Most Recent Value  Type of Advance Directive  Living will  Pre-existing out of facility DNR order (yellow form or pink MOST form)  -  "MOST" Form in Place?  -     Family Communication: Wife at bedside Disposition Plan: Once less orthostatic can go home  Time spent: 35 minutes  [place]

## 2017-10-24 NOTE — Evaluation (Signed)
Physical Therapy Evaluation Patient Details Name: Mark [name]. 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  Here with lightheadedness/near-syncope, orthostasis, fall. He states that for ~2wks, he has been experiencing intermittent positional lightheadedness/near-syncope, happening largely at random, but typically occurring w/ movement/activity.  Clinical Impression  Pt did well with basic mobility and was safe transferring to standing, however he quickly became lightheaded in standing and needed to sit back down.  Overall he had functional strength, good mobility, was confident in standing with walker until he had his dizziness. Appears that when his BP/syncope issues clear he will likely be able to return home safely, though HHPT would be helpful to get back to his baseline.     Follow Up Recommendations Home health PT    Equipment Recommendations  None recommended by PT    Recommendations for Other Services       Precautions / Restrictions Precautions Precautions: Fall Restrictions Weight Bearing Restrictions: No      Mobility  Bed Mobility Overal bed mobility: Independent             General bed mobility comments: Pt able to easily get to sitting at EOB, no lightheadeness with transition  Transfers Overall transfer level: Modified independent Equipment used: Rolling walker (2 wheeled)             General transfer comment: Pt was able to get to standing with slightly raised bed and UE use of rails.  However within ~15 seconds of static standing he started to feel light headed and needed to sit back down quickly.    Ambulation/Gait             General Gait Details: deferred secodary to lightheadedness with standing  Stairs            Wheelchair Mobility    Modified Rankin (Stroke Patients  Only)       Balance                                             Pertinent Vitals/Pain Pain Assessment: No/denies pain    Home Living Family/patient expects to be discharged to:: Private residence Living Arrangements: Spouse/significant other Available Help at Discharge: Family Type of Home: House Home Access: Stairs to enter Entrance Stairs-Rails: None Entrance Stairs-Number of Steps: 1   Home Equipment: Environmental consultant - 2 wheels;Cane - quad      Prior Function Level of Independence: Independent with assistive device(s)         Comments: Pt has been doing less over the last few weeks because of near-syncope, but typically is able to drive, do light errands, etc with QC     Hand Dominance        Extremity/Trunk Assessment   Upper Extremity Assessment Upper Extremity Assessment: Overall WFL for tasks assessed    Lower Extremity Assessment Lower Extremity Assessment: Overall WFL for tasks assessed       Communication   Communication: No difficulties  Cognition Arousal/Alertness: Awake/alert Behavior During Therapy: WFL for tasks assessed/performed Overall Cognitive Status: Within Functional Limits for tasks assessed  General Comments General comments (skin integrity, edema, etc.): pt's BP getting to sitting ws 146/105, attempted to take it when he stood but he needed to sit quickly, was 121/73 back in sitting after brief standing bout    Exercises     Assessment/Plan    PT Assessment Patient needs continued PT services  PT Problem List Decreased activity tolerance;Decreased balance;Decreased safety awareness;Decreased knowledge of use of DME;Cardiopulmonary status limiting activity       PT Treatment Interventions DME instruction;Gait training;Stair training;Functional mobility training;Therapeutic exercise;Therapeutic activities;Balance training;Neuromuscular re-education;Patient/family  education    PT Goals (Current goals can be found in the Care Plan section)  Acute Rehab PT Goals Patient Stated Goal: go home PT Goal Formulation: With patient Time For Goal Achievement: 11/07/17 Potential to Achieve Goals: Good    Frequency Min 2X/week   Barriers to discharge        Co-evaluation               AM-PAC PT "6 Clicks" Daily Activity  Outcome Measure Difficulty turning over in bed (including adjusting bedclothes, sheets and blankets)?: None Difficulty moving from lying on back to sitting on the side of the bed? : None Difficulty sitting down on and standing up from a chair with arms (e.g., wheelchair, bedside commode, etc,.)?: A Little Help needed moving to and from a bed to chair (including a wheelchair)?: A Lot Help needed walking in hospital room?: A Lot Help needed climbing 3-5 steps with a railing? : A Lot 6 Click Score: 17    End of Session Equipment Utilized During Treatment: Gait belt Activity Tolerance: Patient tolerated treatment well(limited due to near-syncope with standing)     PT Visit Diagnosis: Dizziness and giddiness (R42);Difficulty in walking, not elsewhere classified (R26.2)    Time: 9604-5409 PT Time Calculation (min) (ACUTE ONLY): 23 min   Charges:   PT Evaluation $PT Eval Low Complexity: 1 Low          Kreg Shropshire, DPT 10/24/2017, 11:59 AM

## 2017-10-24 NOTE — H&P (Signed)
[facility] [place] NAME: Mark [name]    MR#:  [id number]  DATE OF BIRTH:  May 13, 1940  DATE OF ADMISSION:  10/23/2017  PRIMARY CARE PHYSICIAN: Tracie Harrier, MD   REQUESTING/REFERRING PHYSICIAN: Paulette Blanch, MD  CHIEF COMPLAINT:   Chief Complaint  Patient presents with  . Dizziness  . Weakness    HISTORY OF PRESENT ILLNESS:  Mark [name]  is a 77 y.o. 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 Ubaldo Glassing) p/w lightheadedness/near-syncope, orthostasis, mechanical fall. He states that for ~2wks, he has been experiencing intermittent positional lightheadedness/near-syncope, happening largely at random, but typically occurring w/ movement/activity. He states that on Saturday (10/23/2017) morning, he got up out of bed to let the dog out, slipped and fell to his L side, hitting his L hip. He states he had pain in the hip, but was able to get up and walk without too much difficulty. He went back to bed. He denied lightheadedness before his fall. Later in the day, however, he states he developed persistent lightheadedness with any activity, relieved with rest. He denies chest pain, shortness of breath, palpitations, N/V, diaphoresis, fatigue/malaise, generalized weakness, focal weakness, headache, vertigo, change in hearing/vision, LOC. He is well appearing. His wife says he doesn't drink enough water. Orthostatics (+) in ED.  PAST MEDICAL HISTORY:   Past Medical History:  Diagnosis Date  . Anxiety   . Atrial fibrillation ([place])   . Back pain, chronic   . Colon polyp   . Cyst of left kidney   . GERD (gastroesophageal reflux disease)   . Heart murmur   . Hyperlipemia   . Mitral valve prolapse   . Neuromuscular disorder ([place])   . Neuropathy   . Osteoarthritis   . Pulmonary emboli ([place])   . Spinal stenosis   . Thyroid nodule   . Vertigo     PAST SURGICAL HISTORY:    Past Surgical History:  Procedure Laterality Date  . APPENDECTOMY    . BACK SURGERY    . COLONOSCOPY N/A 08/15/2014   Procedure: COLONOSCOPY;  Surgeon: [name], MD;  Location: [facility] ENDOSCOPY;  Service: Endoscopy;  Laterality: N/A;  . ESOPHAGOGASTRODUODENOSCOPY N/A 08/15/2014   Procedure: ESOPHAGOGASTRODUODENOSCOPY (EGD);  Surgeon: [name], MD;  Location: [facility] ENDOSCOPY;  Service: Endoscopy;  Laterality: N/A;  . TONSILLECTOMY      SOCIAL HISTORY:   Social History   Tobacco Use  . Smoking status: Never Smoker  . Smokeless tobacco: Never Used  Substance Use Topics  . Alcohol use: Yes    Alcohol/week: 0.6 oz    Types: 1 Glasses of wine per week    FAMILY HISTORY:  History reviewed. No pertinent family history.  DRUG ALLERGIES:   Allergies  Allergen Reactions  . Erythromycin Swelling  . Keflex [Cephalexin] Swelling  . Penicillins     REVIEW OF SYSTEMS:   Review of Systems  Constitutional: Negative for chills, diaphoresis, fever, malaise/fatigue and weight loss.  HENT: Negative for congestion, ear pain, hearing loss, nosebleeds, sinus pain, sore throat and tinnitus.   Eyes: Negative for blurred vision, double vision and photophobia.  Respiratory: Negative for cough, hemoptysis, sputum production, shortness of breath and wheezing.   Cardiovascular: Negative for chest pain, palpitations, orthopnea, claudication, leg swelling and PND.  Gastrointestinal: Negative for abdominal pain, blood in stool, constipation, diarrhea, heartburn, melena, nausea and vomiting.  Genitourinary: Negative for  dysuria, flank pain, frequency, hematuria and urgency.  Musculoskeletal: Positive for falls. Negative for back pain, joint pain, myalgias and neck pain.  Skin: Negative for itching and rash.  Neurological: Positive for dizziness (Lightheadedness/near-syncope). Negative for tingling, tremors, sensory change, speech change, focal weakness, seizures, loss of consciousness,  weakness and headaches.  Psychiatric/Behavioral: Negative for memory loss. The patient does not have insomnia.    MEDICATIONS AT HOME:   Prior to Admission medications   Medication Sig Start Date End Date Taking? Authorizing Provider  famotidine (PEPCID AC) 10 MG chewable tablet Chew 20 mg by mouth 2 (two) times daily as needed for heartburn.    [provider]  finasteride (PROSCAR) 5 MG tablet  12/01/16   [provider]  FLUoxetine (PROZAC) 40 MG capsule Take 40 mg by mouth daily. Sammuel Cooper, thurs, fri    [provider]  gabapentin (NEURONTIN) 100 MG capsule  12/23/16   [provider]  Meth-Hyo-M Bl-Na Phos-Ph Sal (URIBEL) 118 MG CAPS Take 1 capsule (118 mg total) by mouth 4 (four) times daily as needed. 12/24/16   Hollice Espy, MD  nortriptyline (PAMELOR) 10 MG capsule  12/23/16   [provider]  simvastatin (ZOCOR) 40 MG tablet Take 40 mg by mouth daily.    [provider]  tamsulosin (FLOMAX) 0.4 MG CAPS capsule  12/14/16   [provider]  warfarin (COUMADIN) 5 MG tablet Take 5 mg by mouth every evening. 1 tab on Mon, tues, thurs, fri. 1/2 tab on wed sat and sun    [provider]      VITAL SIGNS:  Blood pressure 139/90, pulse (!) 111, temperature 97.7 F (36.5 C), temperature source Oral, resp. rate 18, height 6\' 3"  (1.905 m), weight 104.3 kg (230 lb), SpO2 97 %.  PHYSICAL EXAMINATION:  Physical Exam  Constitutional: He is oriented to person, place, and time. He appears well-developed and well-nourished. He is active and cooperative.  Non-toxic appearance. He does not have a sickly appearance. He does not appear ill. No distress. He is not intubated.  HENT:  Head: Normocephalic and atraumatic.  Mouth/Throat: Oropharynx is clear and moist. No oropharyngeal exudate.  Eyes: Conjunctivae, EOM and lids are normal. No scleral icterus.  Neck: Neck supple. No JVD present. No thyromegaly present.  Cardiovascular:  Regular rhythm, S1 normal and S2 normal.  No extrasystoles are present. Tachycardia present. Exam reveals no gallop, no S3, no S4, no distant heart sounds and no friction rub.  No murmur heard. Pulmonary/Chest: Effort normal and breath sounds normal. No accessory muscle usage or stridor. No apnea, no tachypnea and no bradypnea. He is not intubated. No respiratory distress. He has no decreased breath sounds. He has no wheezes. He has no rhonchi. He has no rales.  Abdominal: Bowel sounds are normal. He exhibits no distension. There is no tenderness. There is no rigidity, no rebound and no guarding.  Musculoskeletal: Normal range of motion. He exhibits no edema or tenderness.  Lymphadenopathy:    He has no cervical adenopathy.  Neurological: He is alert and oriented to person, place, and time. He is not disoriented.  Skin: Skin is warm and dry. No rash noted. He is not diaphoretic. No erythema.  Psychiatric: He has a normal mood and affect. His speech is normal and behavior is normal. Judgment and thought content normal. Cognition and memory are normal.   Sinus tachycardia (regular). MMM. Lungs clear. Firm protuberant abdomen. No edema. LABORATORY PANEL:   CBC Recent Labs  Lab 10/23/17 1839  WBC 11.5*  HGB 14.2  HCT 41.2  PLT 217   ------------------------------------------------------------------------------------------------------------------  Chemistries  Recent Labs  Lab 10/23/17 1839  NA 140  K 3.9  CL 108  CO2 25  GLUCOSE 166*  BUN 17  CREATININE 1.27*  CALCIUM 8.3*   ------------------------------------------------------------------------------------------------------------------  Cardiac Enzymes Recent Labs  Lab 10/23/17 2212  TROPONINI <0.03   ------------------------------------------------------------------------------------------------------------------  RADIOLOGY:  Ct Hip Left Wo Contrast  Result Date: 10/23/2017 CLINICAL DATA:  LEFT hip pain post fall this  morning, suspicious radiographs EXAM: CT OF THE LEFT HIP WITHOUT CONTRAST TECHNIQUE: Multidetector CT imaging of the left hip was performed according to the standard protocol. Multiplanar CT image reconstructions were also generated. COMPARISON:  None FINDINGS: Bones/Joint/Cartilage Diffuse osseous demineralization. Minimal hip joint space narrowing. Visualized LEFT hemipelvis intact. Proximal LEFT femur appears intact. No femoral fracture or dislocation identified. Small spur identified at the anterior aspect of the LEFT femoral head/neck junction. No joint effusion. Ligaments Suboptimally assessed by CT. Muscles and Tendons Muscular planes are normal appearance. Soft tissues Soft tissues unremarkable. IMPRESSION: No LEFT femoral neck fracture identified. Electronically Signed   By: Lavonia Dana M.D.   On: 10/23/2017 23:56   Dg Chest Portable 1 View  Result Date: 10/23/2017 CLINICAL DATA:  77 y/o  M; tachycardia.  Shortness of breath. EXAM: PORTABLE CHEST 1 VIEW COMPARISON:  12/02/2004 chest radiographs. FINDINGS: The heart size and mediastinal contours are within normal limits. Both lungs are clear. Mild multilevel degenerative changes of the thoracic spine. No acute osseous abnormality is evident. IMPRESSION: No active disease. Electronically Signed   By: Kristine Garbe M.D.   On: 10/23/2017 23:21   Dg Hip Unilat W Or Wo Pelvis 2-3 Views Left  Result Date: 10/23/2017 CLINICAL DATA:  Fall with left hip pain. EXAM: DG HIP (WITH OR WITHOUT PELVIS) 2-3V LEFT COMPARISON:  02/25/2016 FINDINGS: Views of the left hip show frontal pelvis with AP and frog-leg lateral diffuse demineralization. Frog-leg lateral view reveals a band of increased density in the femoral neck with associated cortical off step. Imaging features are suspicious for cortical and trabecular disruption from nondisplaced femoral neck fracture. IMPRESSION: Frog-leg lateral view suspicious for nondisplaced femoral neck fracture. CT or MRI  could be used to further evaluate. Electronically Signed   By: Misty Stanley M.D.   On: 10/23/2017 22:58   IMPRESSION AND PLAN:   A/P: 67M lightheadedness, orthostatic near-syncope, mechanical fall. Dehydration, creatinine elevation. Hyperglycemia, hypocalcemia, leukocytosis. -ED imaging (-) L hip fracture/dislocation -Tele, continuous cardiac monitoring -Orthostatic VS (+) -IVF -Echo -FSG -Neuro checks -Fall precautions -Hold Flomax until improved -Trop-I (-) x1, rpt pending -ASA81 -INR pending -Cr 1.27 on present admission, baseline 0.9-1.1. 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 trauma/stress demargination -c/w other home meds/formulary subs -Cardiac diet -Coumadin -Full code -Observation, < 2 midnights   All the records are reviewed and case discussed with ED provider. Management plans discussed with the patient, family and they are in agreement.  CODE STATUS: Full code  TOTAL TIME TAKING CARE OF THIS PATIENT: 75 minutes.    Arta Silence M.D on 10/24/2017 at 2:36 AM  Between 7am to 6pm - Pager - [phone]  After 6pm go to [email or web address] - Proofreader  [facility]  Hospitalists  Office  [phone]  CC: Primary care physician; Tracie Harrier, MD   Note: This dictation  was prepared with Dragon dictation along with smaller phrase technology. Any transcriptional errors that result from this process are unintentional.

## 2017-10-25 LAB — CBC
HCT: 30.2 % — ABNORMAL LOW (ref 40.0–52.0)
Hemoglobin: 10.6 g/dL — ABNORMAL LOW (ref 13.0–18.0)
MCH: 32.3 pg (ref 26.0–34.0)
MCHC: 35 g/dL (ref 32.0–36.0)
MCV: 92.4 fL (ref 80.0–100.0)
PLATELETS: 164 10*3/uL (ref 150–440)
RBC: 3.27 MIL/uL — AB (ref 4.40–5.90)
RDW: 13.5 % (ref 11.5–14.5)
WBC: 12.7 10*3/uL — ABNORMAL HIGH (ref 3.8–10.6)

## 2017-10-25 LAB — PROTIME-INR
INR: 2.22
Prothrombin Time: 24.4 seconds — ABNORMAL HIGH (ref 11.4–15.2)

## 2017-10-25 LAB — BASIC METABOLIC PANEL
Anion gap: 6 (ref 5–15)
BUN: 15 mg/dL (ref 8–23)
CO2: 27 mmol/L (ref 22–32)
CREATININE: 0.86 mg/dL (ref 0.61–1.24)
Calcium: 7.8 mg/dL — ABNORMAL LOW (ref 8.9–10.3)
Chloride: 106 mmol/L (ref 98–111)
GFR calc Af Amer: >60 mL/min (ref >60)
GFR calc non Af Amer: >60 mL/min (ref >60)
GLUCOSE: 123 mg/dL — AB (ref 70–99)
Potassium: 4.1 mmol/L (ref 3.5–5.1)
Sodium: 139 mmol/L (ref 135–145)

## 2017-10-25 LAB — GLUCOSE, CAPILLARY: GLUCOSE-CAPILLARY: 110 mg/dL — AB (ref 70–99)

## 2017-10-25 MED ORDER — MELOXICAM 15 MG PO TABS: 15.0000 mg | ORAL_TABLET | Freq: Every day | ORAL | 0 refills | Status: DC

## 2017-10-25 MED ORDER — KETOROLAC TROMETHAMINE 15 MG/ML IJ SOLN
15.0000 mg | INTRAMUSCULAR | Status: AC
  Administered 2017-10-25: 15 mg via INTRAVENOUS
  Filled 2017-10-25: qty 1

## 2017-10-25 MED ORDER — SODIUM CHLORIDE 0.9 % IV BOLUS
500.0000 mL | Freq: Once | INTRAVENOUS | Status: AC
  Administered 2017-10-25: 500 mL via INTRAVENOUS

## 2017-10-25 MED ORDER — KETOROLAC TROMETHAMINE 15 MG/ML IJ SOLN: 15.0000 mg | Freq: Four times a day (QID) | INTRAMUSCULAR | Status: DC | PRN

## 2017-10-25 NOTE — Progress Notes (Signed)
Physical Therapy Treatment Patient Details Name: Mark [name]. 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  Here with lightheadedness/near-syncope, orthostasis, fall. He states that for ~2wks, he has been experiencing intermittent positional lightheadedness/near-syncope, happening largely at random, but typically occurring w/ movement/activity.    PT Comments    Pt is progressing slowly towards goals today. PT monitored orthostatic BP Supine: 139/74,  Seated: 152/64, standing:107/57. Pt was asymptomatic throughout treatment for lightheadedness/dizziness. Pt tolerated supine and seated there.ex well. Pt functional mobility was roughly the same as yesterday, which is similar to pts baseline function. Pt deferred ambulation 2/2 significant drop in standing BP despite pt being asymptomatic. D/C recommendations continue to be appropriate at this time. PT does not expect functional mobility will be impacted once orthostasis improves.  Follow Up Recommendations  Home health PT     Equipment Recommendations  None recommended by PT    Recommendations for Other Services       Precautions / Restrictions Precautions Precautions: Fall Restrictions Weight Bearing Restrictions: No    Mobility  Bed Mobility Overal bed mobility: Independent             General bed mobility comments: Pt able to easily get to sitting at EOB, no lightheadeness with transition.  Transfers Overall transfer level: Modified independent Equipment used: 1 person hand held assist             General transfer comment: Pt reqired increased time for sit>stand but showedd good eccentric control with stand>sit. Pt was able to get to standing with slightly raised bed and UE use of rails. Pt was aysomtomatic for light headiness throughout  transfer.  Ambulation/Gait             General Gait Details: deffered secondary to drop in BP, but pt was asymptomatic   Chief Strategy Officer    Modified Rankin (Stroke Patients Only)       Balance                                            Cognition Arousal/Alertness: Awake/alert Behavior During Therapy: WFL for tasks assessed/performed Overall Cognitive Status: Within Functional Limits for tasks assessed                                        Exercises Other Exercises Other Exercises: pt insructed in supine there.ex to include ankle pumps x10, quad/glute sets x10, hip abd/add x10, heel slides x10, and SLR x10. Pt instructed in seated ther.ex to include LAQ x10 and Hip flexor marching x10    General Comments        Pertinent Vitals/Pain Pain Assessment: No/denies pain    Home Living                      Prior Function            PT Goals (current goals can now be found in the care plan section) Acute Rehab PT Goals Patient Stated Goal: go home PT Goal Formulation: With patient Time For Goal Achievement: 11/07/17  Potential to Achieve Goals: Good Progress towards PT goals: Progressing toward goals    Frequency    Min 2X/week      PT Plan Current plan remains appropriate    Co-evaluation              AM-PAC PT "6 Clicks" Daily Activity  Outcome Measure  Difficulty turning over in bed (including adjusting bedclothes, sheets and blankets)?: None Difficulty moving from lying on back to sitting on the side of the bed? : None Difficulty sitting down on and standing up from a chair with arms (e.g., wheelchair, bedside commode, etc,.)?: A Little Help needed moving to and from a bed to chair (including a wheelchair)?: A Lot Help needed walking in hospital room?: A Lot Help needed climbing 3-5 steps with a railing? : A Lot 6 Click Score: 17    End of Session Equipment  Utilized During Treatment: Gait belt Activity Tolerance: Patient tolerated treatment well;Other (comment)(limited by decresed standing BP) Patient left: in bed;with call bell/phone within reach;with bed alarm set;with family/visitor present Nurse Communication: Mobility status;Other (comment)(BP) PT Visit Diagnosis: Dizziness and giddiness (R42);Difficulty in walking, not elsewhere classified (R26.2)     Time: 2449-7530 PT Time Calculation (min) (ACUTE ONLY): 28 min  Charges:                        Rosario Adie, SPT    Rosario Adie 10/25/2017, 9:23 AM

## 2017-10-25 NOTE — Progress Notes (Addendum)
Patient ID: Mark [name]., male   DOB: 03/17/1941, 77 y.o.   MRN: [id number]  [employer] PROGRESS NOTE  Mark [name]. QPY:[id number] DOB: 06-08-1940 DOA: 10/23/2017 PCP: Tracie Harrier, MD  HPI/Subjective: Patient asking when he can go home.  He was still orthostatic this morning.  Wife came out and stated that his hip was hurting and needed something for it and he could not walk on it.  Objective: Vitals:   10/25/17 0303 10/25/17 0800  BP: (!) 162/89   Pulse: (!) 101 91  Resp: 17 16  Temp: (!) 97.5 F (36.4 C) 98 F (36.7 C)  SpO2:  97%    Filed Weights   10/23/17 1835 10/24/17 0235 10/25/17 0303  Weight: 104.3 kg (230 lb) 93.6 kg (206 lb 4.8 oz) 108 kg (238 lb 1.6 oz)    ROS: Review of Systems  Constitutional: Negative for chills and fever.  Eyes: Negative for blurred vision.  Respiratory: Negative for cough and shortness of breath.   Cardiovascular: Negative for chest pain.  Gastrointestinal: Negative for abdominal pain, constipation, diarrhea, nausea and vomiting.  Genitourinary: Negative for dysuria.  Musculoskeletal: Positive for back pain and joint pain.  Neurological: Negative for dizziness and headaches.   Exam: Physical Exam  Constitutional: He is oriented to person, place, and time.  HENT:  Nose: No mucosal edema.  Mouth/Throat: No oropharyngeal exudate or posterior oropharyngeal edema.  Eyes: Pupils are equal, round, and reactive to light. Conjunctivae, EOM and lids are normal.  Neck: No JVD present. Carotid bruit is not present. No edema present. No thyroid mass and no thyromegaly present.  Cardiovascular: S1 normal and S2 normal. Exam reveals no gallop.  No murmur heard. Pulses:      Dorsalis pedis pulses are 2+ on the right side, and 2+ on the left side.  Respiratory: No respiratory distress. He has no wheezes. He has no rhonchi. He has no rales.  GI: Soft. Bowel sounds are normal. There is no tenderness.  Musculoskeletal:       Right  ankle: He exhibits swelling.       Left ankle: He exhibits swelling.  Slight pain in the left sacroiliac area to palpation.  Lymphadenopathy:    He has no cervical adenopathy.  Neurological: He is alert and oriented to person, place, and time. No cranial nerve deficit.  Skin: Skin is warm. No rash noted. Nails show no clubbing.  Psychiatric: He has a normal mood and affect.      Data Reviewed: Basic Metabolic Panel: Recent Labs  Lab 10/23/17 1839 10/25/17 0417  NA 140 139  K 3.9 4.1  CL 108 106  CO2 25 27  GLUCOSE 166* 123*  BUN 17 15  CREATININE 1.27* 0.86  CALCIUM 8.3* 7.8*   CBC: Recent Labs  Lab 10/23/17 1839 10/25/17 0417  WBC 11.5* 12.7*  HGB 14.2 10.6*  HCT 41.2 30.2*  MCV 92.8 92.4  PLT 217 164   Cardiac Enzymes: Recent Labs  Lab 10/23/17 2212 10/24/17 0508 10/24/17 1100  TROPONINI <0.03 <0.03 <0.03    CBG: Recent Labs  Lab 10/24/17 0750 10/24/17 1138 10/24/17 1634 10/24/17 2130 10/25/17 0616  GLUCAP 149* 160* 120* 112* 110*     Studies: Ct Hip Left Wo Contrast  Result Date: 10/23/2017 CLINICAL DATA:  LEFT hip pain post fall this morning, suspicious radiographs EXAM: CT OF THE LEFT HIP WITHOUT CONTRAST TECHNIQUE: Multidetector CT imaging of the left hip was performed according to the standard protocol. Multiplanar  CT image reconstructions were also generated. COMPARISON:  None FINDINGS: Bones/Joint/Cartilage Diffuse osseous demineralization. Minimal hip joint space narrowing. Visualized LEFT hemipelvis intact. Proximal LEFT femur appears intact. No femoral fracture or dislocation identified. Small spur identified at the anterior aspect of the LEFT femoral head/neck junction. No joint effusion. Ligaments Suboptimally assessed by CT. Muscles and Tendons Muscular planes are normal appearance. Soft tissues Soft tissues unremarkable. IMPRESSION: No LEFT femoral neck fracture identified. Electronically Signed   By: Lavonia Dana M.D.   On: 10/23/2017 23:56    Dg Chest Portable 1 View  Result Date: 10/23/2017 CLINICAL DATA:  77 y/o  M; tachycardia.  Shortness of breath. EXAM: PORTABLE CHEST 1 VIEW COMPARISON:  12/02/2004 chest radiographs. FINDINGS: The heart size and mediastinal contours are within normal limits. Both lungs are clear. Mild multilevel degenerative changes of the thoracic spine. No acute osseous abnormality is evident. IMPRESSION: No active disease. Electronically Signed   By: Kristine Garbe M.D.   On: 10/23/2017 23:21   Dg Hip Unilat W Or Wo Pelvis 2-3 Views Left  Result Date: 10/23/2017 CLINICAL DATA:  Fall with left hip pain. EXAM: DG HIP (WITH OR WITHOUT PELVIS) 2-3V LEFT COMPARISON:  02/25/2016 FINDINGS: Views of the left hip show frontal pelvis with AP and frog-leg lateral diffuse demineralization. Frog-leg lateral view reveals a band of increased density in the femoral neck with associated cortical off step. Imaging features are suspicious for cortical and trabecular disruption from nondisplaced femoral neck fracture. IMPRESSION: Frog-leg lateral view suspicious for nondisplaced femoral neck fracture. CT or MRI could be used to further evaluate. Electronically Signed   By: Misty Stanley M.D.   On: 10/23/2017 22:58    Scheduled Meds: . aspirin  81 mg Oral Daily  . finasteride  5 mg Oral Daily  . FLUoxetine  40 mg Oral Once per day on Mon Tue Thu Fri  . simvastatin  40 mg Oral Daily  . sodium chloride flush  3 mL Intravenous Q12H  . warfarin  2.5 mg Oral Once per day on Sun Wed Sat  . warfarin  5 mg Oral Once per day on Mon Tue Thu Fri   Continuous Infusions:  Assessment/Plan:  1. Orthostatic hypotension.  IV fluid  bolus and hold Flomax.  Blood pressure dropped into the 80s with standing.  May take a few days for Flomax to get out of the system.   recheck orthostatics today. 2. 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 3. BPH on finasteride.  Hold Flomax. 4. Hyperlipidemia unspecified on  simvastatin 5. Left sacroiliitis.  Continue aspirin for now.  Gave a dose of Toradol today.  May need Solu-Medrol if does not improve. 6. Chronic back pain 7. Depression on fluoxetine  Code Status:     Code Status Orders  (From admission, onward)        Start     Ordered   10/24/17 0236  Full code  Continuous     10/24/17 0235    Code Status History    This patient has a current code status but no historical code status.    Advance Directive Documentation     Most Recent Value  Type of Advance Directive  Living will  Pre-existing out of facility DNR order (yellow form or pink MOST form)  -  "MOST" Form in Place?  -     Family Communication: Wife at bedside Disposition Plan: Patient was to be discharged today  Time spent: 38 minutes  Merida [employer]  [employer]

## 2017-10-25 NOTE — Plan of Care (Signed)
  Problem: Clinical Measurements: Goal: Will remain free from infection Outcome: Progressing   Problem: Activity: Goal: Risk for activity intolerance will decrease Outcome: Progressing   Problem: Elimination: Goal: Will not experience complications related to urinary retention Outcome: Progressing   Problem: Pain Managment: Goal: General experience of comfort will improve Outcome: Progressing   Problem: Safety: Goal: Ability to remain free from injury will improve Outcome: Progressing   Problem: Education: Goal: Knowledge of General Education information will improve Description Including pain rating scale, medication(s)/side effects and non-pharmacologic comfort measures Outcome: Completed/Met

## 2017-10-25 NOTE — Progress Notes (Signed)
Pt to be discharged this afternoon. orthostasis much improved. Pt ambulated in hallway successfully earlier. Iv and tele removed. disch instructions and prescrip given to pt and wife. disch via w.c. Accompanied by wife.

## 2017-10-25 NOTE — Discharge Summary (Signed)
[facility] NAME: Mark [name]    MR#:  [id number]  DATE OF BIRTH:  1940-09-30  DATE OF ADMISSION:  10/23/2017 ADMITTING PHYSICIAN: Arta Silence, MD  DATE OF DISCHARGE: 10/25/2017  PRIMARY CARE PHYSICIAN: Tracie Harrier, MD    ADMISSION DIAGNOSIS:  Lightheadedness [R42] Tachycardia [R00.0] Contusion of left hip, initial encounter [S70.02XA] Orthostatic dizziness [R42]  DISCHARGE DIAGNOSIS:  Active Problems:   Near syncope   Orthostatic hypotension   SECONDARY DIAGNOSIS:   Past Medical History:  Diagnosis Date  . Anxiety   . Atrial fibrillation ([place])   . Back pain, chronic   . Colon polyp   . Cyst of left kidney   . GERD (gastroesophageal reflux disease)   . Heart murmur   . Hyperlipemia   . Mitral valve prolapse   . Neuromuscular disorder ([place])   . Neuropathy   . Osteoarthritis   . Pulmonary emboli ([place])   . Spinal stenosis   . Thyroid nodule   . Vertigo     HOSPITAL COURSE:   1.  Orthostatic hypotension.  Patient was feeling better on the afternoon on 8 5 and wanted to go home.  We gave IV fluids during the hospital course and an IV fluid bolus.  Continue to hold Flomax.  Most recent set of orthostatics he did drop down to blood pressure 97/59.  He felt well walking around.  After walking his blood pressure was up to 152/71.  Family has a blood pressure cuff at home and like to check it at home.  Advised to wear the TED hose. Follow-up PMD and check orthostatics and follow-up appointment. 2.  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  BPH on finasteride 4.  Hyperlipidemia unspecified on simvastatin 5.  Left sacroiliitis.  Improved after Toradol.  Give Mobic upon discharge home. 6.  Chronic back pain 7.  Depression on fluoxetine  DISCHARGE CONDITIONS:   Satisfactory  CONSULTS OBTAINED:  Treatment Team:  Arta Silence, MD  DRUG ALLERGIES:   Allergies  Allergen  Reactions  . Erythromycin Swelling  . Keflex [Cephalexin] Swelling  . Penicillins     DISCHARGE MEDICATIONS:   Allergies as of 10/25/2017      Reactions   Erythromycin Swelling   Keflex [cephalexin] Swelling   Penicillins       Medication List    STOP taking these medications   tamsulosin 0.4 MG Caps capsule Commonly known as:  FLOMAX     TAKE these medications   famotidine 10 MG chewable tablet Commonly known as:  PEPCID AC Chew 20 mg by mouth 2 (two) times daily as needed for heartburn.   finasteride 5 MG tablet Commonly known as:  PROSCAR   FLUoxetine 40 MG capsule Commonly known as:  PROZAC Take 40 mg by mouth daily. Mon, tues, thurs, fri   gabapentin 100 MG capsule Commonly known as:  NEURONTIN   meloxicam 15 MG tablet Commonly known as:  MOBIC Take 1 tablet (15 mg total) by mouth daily.   nortriptyline 10 MG capsule Commonly known as:  PAMELOR   simvastatin 40 MG tablet Commonly known as:  ZOCOR Take 40 mg by mouth daily.   URIBEL 118 MG Caps Take 1 capsule (118 mg total) by mouth 4 (four) times daily as needed.   warfarin 5 MG tablet Commonly known as:  COUMADIN Take 5 mg by mouth every evening. 1 tab on Mon, tues, thurs, fri. 1/2 tab on wed  sat and sun        DISCHARGE INSTRUCTIONS:    Follow-up PMD 6 days  If you experience worsening of your admission symptoms, develop shortness of breath, life threatening emergency, suicidal or homicidal thoughts you must seek medical attention immediately by calling 911 or calling your MD immediately  if symptoms less severe.  You Must read complete instructions/literature along with all the possible adverse reactions/side effects for all the Medicines you take and that have been prescribed to you. Take any new Medicines after you have completely understood and accept all the possible adverse reactions/side effects.   Please note  You were cared for by a hospitalist during your hospital stay. If you have  any questions about your discharge medications or the care you received while you were in the hospital after you are discharged, you can call the unit and asked to speak with the hospitalist on call if the hospitalist that took care of you is not available. Once you are discharged, your primary care physician will handle any further medical issues. Please note that NO REFILLS for any discharge medications will be authorized once you are discharged, as it is imperative that you return to your primary care physician (or establish a relationship with a primary care physician if you do not have one) for your aftercare needs so that they can reassess your need for medications and monitor your lab values.    Today   CHIEF COMPLAINT:   Chief Complaint  Patient presents with  . Dizziness  . Weakness    HISTORY OF PRESENT ILLNESS:  Mark [name]  is a 77 y.o. male presented with weakness and dizziness   VITAL SIGNS:  Blood pressure (!) 162/89, pulse 91, temperature 98 F (36.7 C), temperature source Oral, resp. rate 16, height 6\' 3"  (1.905 m), weight 108 kg (238 lb 1.6 oz), SpO2 97 %.   PHYSICAL EXAMINATION:  GENERAL:  77 y.o.-year-old patient lying in the bed with no acute distress.  EYES: Pupils equal, round, reactive to light and accommodation. No scleral icterus. Extraocular muscles intact.  HEENT: Head atraumatic, normocephalic. Oropharynx and nasopharynx clear.  NECK:  Supple, no jugular venous distention. No thyroid enlargement, no tenderness.  LUNGS: Normal breath sounds bilaterally, no wheezing, rales,rhonchi or crepitation. No use of accessory muscles of respiration.  CARDIOVASCULAR: S1, S2 normal. No murmurs, rubs, or gallops.  ABDOMEN: Soft, non-tender, non-distended. Bowel sounds present. No organomegaly or mass.  EXTREMITIES: No pedal edema, cyanosis, or clubbing.  NEUROLOGIC: Cranial nerves II through XII are intact. Muscle strength 5/5 in all extremities. Sensation intact. Gait not  checked.  PSYCHIATRIC: The patient is alert and oriented x 3.  SKIN: No obvious rash, lesion, or ulcer.   DATA REVIEW:   CBC Recent Labs  Lab 10/25/17 0417  WBC 12.7*  HGB 10.6*  HCT 30.2*  PLT 164    Chemistries  Recent Labs  Lab 10/25/17 0417  NA 139  K 4.1  CL 106  CO2 27  GLUCOSE 123*  BUN 15  CREATININE 0.86  CALCIUM 7.8*    Cardiac Enzymes Recent Labs  Lab 10/24/17 1100  TROPONINI <0.03      RADIOLOGY:  Ct Hip Left Wo Contrast  Result Date: 10/23/2017 CLINICAL DATA:  LEFT hip pain post fall this morning, suspicious radiographs EXAM: CT OF THE LEFT HIP WITHOUT CONTRAST TECHNIQUE: Multidetector CT imaging of the left hip was performed according to the standard protocol. Multiplanar CT image reconstructions were also generated. COMPARISON:  None FINDINGS: Bones/Joint/Cartilage Diffuse osseous demineralization. Minimal hip joint space narrowing. Visualized LEFT hemipelvis intact. Proximal LEFT femur appears intact. No femoral fracture or dislocation identified. Small spur identified at the anterior aspect of the LEFT femoral head/neck junction. No joint effusion. Ligaments Suboptimally assessed by CT. Muscles and Tendons Muscular planes are normal appearance. Soft tissues Soft tissues unremarkable. IMPRESSION: No LEFT femoral neck fracture identified. Electronically Signed   By: Lavonia Dana M.D.   On: 10/23/2017 23:56   Dg Chest Portable 1 View  Result Date: 10/23/2017 CLINICAL DATA:  77 y/o  M; tachycardia.  Shortness of breath. EXAM: PORTABLE CHEST 1 VIEW COMPARISON:  12/02/2004 chest radiographs. FINDINGS: The heart size and mediastinal contours are within normal limits. Both lungs are clear. Mild multilevel degenerative changes of the thoracic spine. No acute osseous abnormality is evident. IMPRESSION: No active disease. Electronically Signed   By: Kristine Garbe M.D.   On: 10/23/2017 23:21   Dg Hip Unilat W Or Wo Pelvis 2-3 Views Left  Result Date:  10/23/2017 CLINICAL DATA:  Fall with left hip pain. EXAM: DG HIP (WITH OR WITHOUT PELVIS) 2-3V LEFT COMPARISON:  02/25/2016 FINDINGS: Views of the left hip show frontal pelvis with AP and frog-leg lateral diffuse demineralization. Frog-leg lateral view reveals a band of increased density in the femoral neck with associated cortical off step. Imaging features are suspicious for cortical and trabecular disruption from nondisplaced femoral neck fracture. IMPRESSION: Frog-leg lateral view suspicious for nondisplaced femoral neck fracture. CT or MRI could be used to further evaluate. Electronically Signed   By: Misty Stanley M.D.   On: 10/23/2017 22:58      Management plans discussed with the patient, family and they are in agreement.  CODE STATUS:     Code Status Orders  (From admission, onward)        Start     Ordered   10/24/17 0236  Full code  Continuous     10/24/17 0235    Code Status History    This patient has a current code status but no historical code status.    Advance Directive Documentation     Most Recent Value  Type of Advance Directive  Living will  Pre-existing out of facility DNR order (yellow form or pink MOST form)  -  "MOST" Form in Place?  -      TOTAL TIME TAKING CARE OF THIS PATIENT: 38 minutes.    Loletha Grayer M.D on 10/25/2017 at 2:23 PM  Between 7am to 6pm - Pager - [phone]  After 6pm go to [email or web address] - password [email or web address]  [facility] Office  [phone]  CC: Primary care physician; Tracie Harrier, MD

## 2017-10-25 NOTE — Care Management (Signed)
Patient discharged [facility] to [facility].  RNCM contacted patient via telephone.  PT had assessed patient and recommended home health PT.  Patient states that he lives at home with his wife.  PCP Hande.  Patient states that he wishes to decline home health at this time.  Patient states "really the only thing that bothers me is my hip, but once I get to going im ok". Patient informed that should he change his mind, his PCP can order home health.

## 2017-10-26 LAB — CALCIUM, IONIZED: Calcium, Ionized, Serum: 4.6 mg/dL (ref 4.5–5.6)

## 2017-10-28 ENCOUNTER — Telehealth: Payer: Self-pay

## 2017-10-28 NOTE — Telephone Encounter (Signed)
Flagged on EMMI report for having wrong number.  Telephone number missing on report. Called and spoke with patient to confirm number and to ask if he would like to receive a future EMMI call asking about his condition.  He said that would be fine and mentioned his home number as his preferred line.  Chart shows his mobile as preferred, however patient would like to change that as he doesn't always have phone with him.  Change made in chart.    Patient did have a concern to pass along regarding his medications.  He states he was not given his regular medications while here, only receiving one dose of Prozac and coumadin during his stay.  He reports he takes a total of seven meds at home.  Patient expressed concern about becoming very anxious without his Prozac and was worried about developing blood clots with his Factor V condition.  He was upset that the nurse staff did not reconcile his medications.   I thanked him for his feedback and informed him that I would pass along his concern to unit leadership to let them know and to look into.  Informed him he would receive the EMMI automated call in the next few days.

## 2017-11-09 ENCOUNTER — Other Ambulatory Visit: Payer: Self-pay | Admitting: Otolaryngology

## 2017-11-09 DIAGNOSIS — R42 Dizziness and giddiness: Secondary | ICD-10-CM

## 2017-11-12 ENCOUNTER — Ambulatory Visit
Admission: RE | Admit: 2017-11-12 | Discharge: 2017-11-12 | Disposition: A | Discharge status: Home / Self Care | Payer: Medicare Other | Source: Ambulatory Visit | Attending: Otolaryngology | Admitting: Otolaryngology

## 2017-11-12 DIAGNOSIS — R42 Dizziness and giddiness: Secondary | ICD-10-CM | POA: Insufficient documentation

## 2017-11-12 DIAGNOSIS — I6523 Occlusion and stenosis of bilateral carotid arteries: Secondary | ICD-10-CM | POA: Diagnosis not present

## 2017-11-21 ENCOUNTER — Ambulatory Visit
Admission: RE | Admit: 2017-11-21 | Discharge: 2017-11-21 | Disposition: A | Discharge status: Home / Self Care | Payer: Medicare Other | Source: Ambulatory Visit | Attending: Otolaryngology | Admitting: Otolaryngology

## 2017-11-21 DIAGNOSIS — R42 Dizziness and giddiness: Secondary | ICD-10-CM | POA: Diagnosis present

## 2017-11-21 MED ORDER — GADOBENATE DIMEGLUMINE 529 MG/ML IV SOLN
20.0000 mL | Freq: Once | INTRAVENOUS | Status: AC | PRN
  Administered 2017-11-21: 20 mL via INTRAVENOUS

## 2018-02-01 ENCOUNTER — Encounter: Payer: Self-pay | Admitting: Urology

## 2018-02-01 ENCOUNTER — Ambulatory Visit (INDEPENDENT_AMBULATORY_CARE_PROVIDER_SITE_OTHER): Payer: Medicare Other | Admitting: Urology

## 2018-02-01 ENCOUNTER — Other Ambulatory Visit: Payer: Self-pay

## 2018-02-01 VITALS — BP 155/82 | HR 106 | Ht 75.0 in | Wt 230.0 lb

## 2018-02-01 DIAGNOSIS — B3789 Other sites of candidiasis: Secondary | ICD-10-CM | POA: Diagnosis not present

## 2018-02-01 DIAGNOSIS — N401 Enlarged prostate with lower urinary tract symptoms: Secondary | ICD-10-CM

## 2018-02-01 DIAGNOSIS — N390 Urinary tract infection, site not specified: Secondary | ICD-10-CM

## 2018-02-01 DIAGNOSIS — R350 Frequency of micturition: Secondary | ICD-10-CM

## 2018-02-01 LAB — BLADDER SCAN AMB NON-IMAGING: SCAN RESULT: 16

## 2018-02-01 MED ORDER — NYSTATIN 100000 UNIT/GM EX POWD: Freq: Two times a day (BID) | CUTANEOUS | Status: DC

## 2018-02-01 NOTE — Progress Notes (Signed)
February 01, 2018  4:51 PM   Mark [name] August 28, 1940 [id number]  Referring provider: Tracie Harrier, [place] [facility] [place], [place] [address]  Chief Complaint  Patient presents with  . Benign Prostatic Hypertrophy    HPI: Mark [name]. is a 77 yo male patient who presents today for a UTI and management and evaluation of BPH, chronic dysuria, and frequency . The patient's last visit with [place] was on 01/27/2017. He was accompanied today by her wife.    He reports having a fall in August, after which time he endorses nocturia 3-4 times per night and generalized worsened urinary symptoms.   He endorses 1 urge and frequency in the day time and currently on antibitoics given for 7 days. Pt also reports of a burning sensation when urinating which started just yesterday.  Pt reports subjective fevers, and vomitting one day ago.  Seen and evaluated by his primary care yesterday with a floridly positive urine.  He was started on Cipro.  He started to feel better already.  Urine cultures are pending.  His first infection in numerous years.  Pt reports having Jock itch and used lotramin for 2 weeks.   He is currently on finasteride and flomax 0.4mg  which has been taking for quite some time. He has previously tried Myrbetriq in the past which was not helpful.  Notably, he was admitted for a fall secondary to orthostasis.  He was temporarily stopped on Flomax and resumed by his primary care physician.  PSA Trend: 01/01/2017 PSA was 0.42 01/01/2016 PSA was 0.64 12/26/2014 PSA was 0.74 12/22/2013 PSA was 0.52  Voiding diary reviewed today.  IPSS    Row Name 02/01/18 1300         International Prostate Symptom Score   How often have you had the sensation of not emptying your bladder?  About half the time     How often have you had to urinate less than every two hours?  Almost always     How often have you found you stopped and started again several times when  you urinated?  Almost always     How often have you found it difficult to postpone urination?  Almost always     How often have you had a weak urinary stream?  More than half the time     How often have you had to strain to start urination?  Less than half the time     How many times did you typically get up at night to urinate?  5 Times     Total IPSS Score  29       Quality of Life due to urinary symptoms   If you were to spend the rest of your life with your urinary condition just the way it is now how would you feel about that?  Unhappy          PMH: Past Medical History:  Diagnosis Date  . Anxiety   . Atrial fibrillation ([place])   . Back pain, chronic   . Colon polyp   . Cyst of left kidney   . GERD (gastroesophageal reflux disease)   . Heart murmur   . Hyperlipemia   . Mitral valve prolapse   . Neuromuscular disorder ([place])   . Neuropathy   . Osteoarthritis   . Pulmonary emboli ([place])   . Spinal stenosis   . Thyroid nodule   . Vertigo  Surgical History: Past Surgical History:  Procedure Laterality Date  . APPENDECTOMY    . BACK SURGERY    . COLONOSCOPY N/A 08/15/2014   Procedure: COLONOSCOPY;  Surgeon: [name], MD;  Location: [facility] ENDOSCOPY;  Service: Endoscopy;  Laterality: N/A;  . ESOPHAGOGASTRODUODENOSCOPY N/A 08/15/2014   Procedure: ESOPHAGOGASTRODUODENOSCOPY (EGD);  Surgeon: [name], MD;  Location: [facility] ENDOSCOPY;  Service: Endoscopy;  Laterality: N/A;  . TONSILLECTOMY      Home Medications:  Allergies as of 02/01/2018      Reactions   Erythromycin Swelling   Keflex [cephalexin] Swelling   Penicillins       Medication List        Accurate as of 02/01/18  4:50 PM. Always use your most recent med list.          B-12 PO Take 5,000 mg by mouth.   CO Q 10 PO Take by mouth.   ELIQUIS 2.5 MG Tabs tablet Generic drug:  apixaban Take 2.5 mg by mouth 2 (two) times daily.   famotidine 10 MG chewable tablet Commonly known as:   PEPCID AC Chew 20 mg by mouth 2 (two) times daily as needed for heartburn.   finasteride 5 MG tablet Commonly known as:  PROSCAR   FLUoxetine 40 MG capsule Commonly known as:  PROZAC Take 40 mg by mouth daily. Total 60mg  daily   PROZAC 20 MG capsule Generic drug:  FLUoxetine Take 20 mg by mouth daily.   simvastatin 40 MG tablet Commonly known as:  ZOCOR Take 40 mg by mouth daily.   tamsulosin 0.4 MG Caps capsule Commonly known as:  FLOMAX Take 0.4 mg by mouth.   VITAMIN C PO Take by mouth.       Allergies:  Allergies  Allergen Reactions  . Erythromycin Swelling  . Keflex [Cephalexin] Swelling  . Penicillins     Family History: No family history on file.  Social History:  reports that he has never smoked. He has never used smokeless tobacco. He reports that he drinks about 1.0 standard drinks of alcohol per week. His drug history is not on file.  ROS: UROLOGY Frequent Urination?: Yes Hard to postpone urination?: Yes Burning/pain with urination?: Yes Get up at night to urinate?: Yes Leakage of urine?: Yes Urine stream starts and stops?: Yes Trouble starting stream?: No Do you have to strain to urinate?: No Blood in urine?: Yes Urinary tract infection?: Yes Sexually transmitted disease?: No Injury to kidneys or bladder?: No Painful intercourse?: No Weak stream?: No Erection problems?: No Penile pain?: No  Gastrointestinal Nausea?: No Vomiting?: No Indigestion/heartburn?: No Diarrhea?: No Constipation?: No  Constitutional Fever: No Night sweats?: No Weight loss?: No Fatigue?: No  Skin Skin rash/lesions?: No Itching?: No  Eyes Blurred vision?: No Double vision?: No  Ears/Nose/Throat Sore throat?: No Sinus problems?: No  Hematologic/Lymphatic Swollen glands?: No Easy bruising?: No  Cardiovascular Leg swelling?: No Chest pain?: No  Respiratory Cough?: No Shortness of breath?: No  Endocrine Excessive thirst?:  No  Musculoskeletal Back pain?: No Joint pain?: No  Neurological Headaches?: No Dizziness?: No  Psychologic Depression?: No Anxiety?: No  Physical Exam: BP (!) 155/82   Pulse (!) 106   Ht 6\' 3"  (1.905 m)   Wt 230 lb (104.3 kg)   BMI 28.75 kg/m   Constitutional:  Alert and oriented, No acute distress. HEENT: [place] AT, moist mucus membranes.  Trachea midline, no masses. Cardiovascular: No clubbing, cyanosis, or edema. Respiratory: Normal respiratory effort, no increased work  of breathing. GU: No CVA tenderness, subtle erythema in left inguinal fold Skin: No rashes, bruises or suspicious lesions. Neurologic: Grossly intact, no focal deficits, moving all 4 extremities. Psychiatric: Normal mood and affect.   Laboratory Data: Lab Results  Component Value Date   WBC 12.7 (H) 10/25/2017   HGB 10.6 (L) 10/25/2017   HCT 30.2 (L) 10/25/2017   MCV 92.4 10/25/2017   PLT 164 10/25/2017    Lab Results  Component Value Date   CREATININE 0.86 10/25/2017   Urinalysis UA yesterday care everywhere reviewed, floridly positive  Pertinent Imagings:  Results for orders placed or performed in visit on 02/01/18  Bladder Scan (Post Void Residual) in office  Result Value Ref Range   Scan Result 16    Assessment & Plan:    1.Benign prostatic hyperplasia with urinary frequency  -Adequate bladder emptying  -Continue Flomax and finasteride  -IPSS reviewed -Given that the patient's symptoms currently are likely related to active urinary tract infection, will make no changes in his medications at this time -Advised to return if symptoms fail to improve with treatment of his infection -We will defer PSA today given active infection, will repeat next year while on finasteride  2. Dysuria  -Likely secondary to urinary tract infection, currently antibiotics prescribed by his primary care physician  3. Nocturia -Nocturia x4 in the setting of UTI   4.Dx of a candida in left groin   -Advised Nystatin powder twice a day  5. UTI -if symptoms worsen, then schedule another appointment  -Advised happy to see him for same-day visit for UA urine culture if he has signs or symptoms of recurrent urinary tract infection  Return in about 1 year (around 02/02/2019) for IPSS / PVR / DRE / PSA.  [facility] [address] [place], [place] [address] [phone]  I, Lucas Mallow, am acting as a scribe for Dr. Hollice Espy,  I have reviewed the above documentation for accuracy and completeness, and I agree with the above.   Hollice Espy, MD

## 2018-02-03 ENCOUNTER — Ambulatory Visit: Payer: Medicare Other | Admitting: Family Medicine

## 2018-02-03 VITALS — BP 123/76 | HR 92 | Temp 97.9°F

## 2018-02-03 DIAGNOSIS — R300 Dysuria: Secondary | ICD-10-CM

## 2018-02-03 LAB — MICROSCOPIC EXAMINATION
Epithelial Cells (non renal): NONE SEEN /hpf (ref 0–10)
RBC MICROSCOPIC, UA: NONE SEEN /HPF (ref 0–2)

## 2018-02-03 LAB — URINALYSIS, COMPLETE
BILIRUBIN UA: NEGATIVE
GLUCOSE, UA: NEGATIVE
NITRITE UA: POSITIVE — AB
Protein, UA: NEGATIVE
RBC, UA: NEGATIVE
UUROB: 1 mg/dL (ref 0.2–1.0)
pH, UA: 6 (ref 5.0–7.5)

## 2018-02-03 NOTE — Progress Notes (Signed)
Patient presents today with dysuria and urinary frequency and vomiting. Patient states he went to a walk in clinic and they have put him on Cipro. He is currently still on the medication, his urinary symptoms are getting worse. A urine was collected for UA, UCX and PVR was performed with a residual of 169.  Patient states he has not had any Urological surgeries in the last 30 days. Larene Beach reviewed the UA and we will call with UCX results.

## 2018-02-04 ENCOUNTER — Telehealth: Payer: Self-pay | Admitting: Urology

## 2018-02-04 NOTE — Telephone Encounter (Signed)
Patient said that he was supposed to have a script for nystatin but it has not been sent in. Can someone check on this?   Sharyn Lull

## 2018-02-06 LAB — CULTURE, URINE COMPREHENSIVE

## 2018-02-07 NOTE — Telephone Encounter (Signed)
-----   Message from Nori Riis, PA-C sent at 02/07/2018  8:07 AM EST ----- Please let Mr. Reasons know that his urine culture was negative.  Is he feeling any better?

## 2018-02-08 ENCOUNTER — Telehealth: Payer: Self-pay | Admitting: Urology

## 2018-02-08 NOTE — Telephone Encounter (Signed)
Left mess to call, per Dr. Erlene Quan if burning is but but still just having frequency, patient should give some time for possible inflammation to go down. Stomach discomfort could be multiple issues and should follow up with PCP. If frequency worsens can make an appointment to discuss for pick up 2 weeks of myrbetriq 25 mg

## 2018-02-08 NOTE — Telephone Encounter (Signed)
Pt called office again this afternoon waiting on a return call.

## 2018-02-08 NOTE — Telephone Encounter (Signed)
Pt called and is not feeling any better.  Burning is better, but frequency is not.  He is really sick on his stomach.  Please call pt's wife, [name]  [phone]

## 2018-02-08 NOTE — Telephone Encounter (Signed)
Patient notified and will call back if the frequency does not resolve and to try samples

## 2018-02-14 ENCOUNTER — Other Ambulatory Visit: Payer: Self-pay | Admitting: Physician Assistant

## 2018-02-14 DIAGNOSIS — R112 Nausea with vomiting, unspecified: Secondary | ICD-10-CM

## 2018-02-14 DIAGNOSIS — R110 Nausea: Secondary | ICD-10-CM

## 2018-02-16 ENCOUNTER — Ambulatory Visit
Admission: RE | Admit: 2018-02-16 | Discharge: 2018-02-16 | Disposition: A | Discharge status: Home / Self Care | Payer: Medicare Other | Source: Ambulatory Visit | Attending: Physician Assistant | Admitting: Physician Assistant

## 2018-02-16 DIAGNOSIS — N281 Cyst of kidney, acquired: Secondary | ICD-10-CM | POA: Insufficient documentation

## 2018-02-16 DIAGNOSIS — R935 Abnormal findings on diagnostic imaging of other abdominal regions, including retroperitoneum: Secondary | ICD-10-CM | POA: Insufficient documentation

## 2018-02-16 DIAGNOSIS — R110 Nausea: Secondary | ICD-10-CM | POA: Insufficient documentation

## 2018-02-19 ENCOUNTER — Other Ambulatory Visit: Payer: Medicare Other

## 2018-02-19 ENCOUNTER — Encounter
Admission: RE | Admit: 2018-02-19 | Discharge: 2018-02-19 | Disposition: A | Discharge status: Home / Self Care | Payer: Medicare Other | Source: Ambulatory Visit | Attending: Physician Assistant | Admitting: Physician Assistant

## 2018-02-19 DIAGNOSIS — R112 Nausea with vomiting, unspecified: Secondary | ICD-10-CM | POA: Diagnosis not present

## 2018-02-19 MED ORDER — TECHNETIUM TC 99M MEBROFENIN IV KIT
5.9000 | PACK | Freq: Once | INTRAVENOUS | Status: AC | PRN
  Administered 2018-02-19: 5.9 via INTRAVENOUS

## 2018-02-21 ENCOUNTER — Observation Stay
Admission: EM | Admit: 2018-02-21 | Discharge: 2018-02-22 | Disposition: A | Discharge status: Home / Self Care | Payer: Medicare Other | Attending: Internal Medicine | Admitting: Internal Medicine

## 2018-02-21 ENCOUNTER — Encounter: Payer: Self-pay | Admitting: Emergency Medicine

## 2018-02-21 ENCOUNTER — Emergency Department: Payer: Medicare Other

## 2018-02-21 DIAGNOSIS — Z880 Allergy status to penicillin: Secondary | ICD-10-CM | POA: Insufficient documentation

## 2018-02-21 DIAGNOSIS — K219 Gastro-esophageal reflux disease without esophagitis: Secondary | ICD-10-CM | POA: Diagnosis not present

## 2018-02-21 DIAGNOSIS — Z881 Allergy status to other antibiotic agents status: Secondary | ICD-10-CM | POA: Insufficient documentation

## 2018-02-21 DIAGNOSIS — M19011 Primary osteoarthritis, right shoulder: Secondary | ICD-10-CM | POA: Diagnosis not present

## 2018-02-21 DIAGNOSIS — I480 Paroxysmal atrial fibrillation: Secondary | ICD-10-CM | POA: Diagnosis not present

## 2018-02-21 DIAGNOSIS — G8929 Other chronic pain: Secondary | ICD-10-CM | POA: Insufficient documentation

## 2018-02-21 DIAGNOSIS — Z79899 Other long term (current) drug therapy: Secondary | ICD-10-CM | POA: Diagnosis not present

## 2018-02-21 DIAGNOSIS — I4510 Unspecified right bundle-branch block: Secondary | ICD-10-CM | POA: Insufficient documentation

## 2018-02-21 DIAGNOSIS — F329 Major depressive disorder, single episode, unspecified: Secondary | ICD-10-CM | POA: Diagnosis not present

## 2018-02-21 DIAGNOSIS — R2681 Unsteadiness on feet: Secondary | ICD-10-CM | POA: Diagnosis not present

## 2018-02-21 DIAGNOSIS — G9340 Encephalopathy, unspecified: Secondary | ICD-10-CM | POA: Diagnosis not present

## 2018-02-21 DIAGNOSIS — I341 Nonrheumatic mitral (valve) prolapse: Secondary | ICD-10-CM | POA: Insufficient documentation

## 2018-02-21 DIAGNOSIS — E785 Hyperlipidemia, unspecified: Secondary | ICD-10-CM | POA: Insufficient documentation

## 2018-02-21 DIAGNOSIS — R531 Weakness: Secondary | ICD-10-CM

## 2018-02-21 DIAGNOSIS — N400 Enlarged prostate without lower urinary tract symptoms: Secondary | ICD-10-CM | POA: Diagnosis not present

## 2018-02-21 DIAGNOSIS — R269 Unspecified abnormalities of gait and mobility: Secondary | ICD-10-CM | POA: Diagnosis present

## 2018-02-21 DIAGNOSIS — F419 Anxiety disorder, unspecified: Secondary | ICD-10-CM | POA: Insufficient documentation

## 2018-02-21 DIAGNOSIS — Z7901 Long term (current) use of anticoagulants: Secondary | ICD-10-CM | POA: Insufficient documentation

## 2018-02-21 DIAGNOSIS — G2581 Restless legs syndrome: Secondary | ICD-10-CM | POA: Insufficient documentation

## 2018-02-21 DIAGNOSIS — R0602 Shortness of breath: Secondary | ICD-10-CM | POA: Insufficient documentation

## 2018-02-21 DIAGNOSIS — M48062 Spinal stenosis, lumbar region with neurogenic claudication: Secondary | ICD-10-CM | POA: Insufficient documentation

## 2018-02-21 DIAGNOSIS — M5136 Other intervertebral disc degeneration, lumbar region: Secondary | ICD-10-CM | POA: Diagnosis not present

## 2018-02-21 DIAGNOSIS — Z86711 Personal history of pulmonary embolism: Secondary | ICD-10-CM | POA: Insufficient documentation

## 2018-02-21 DIAGNOSIS — G629 Polyneuropathy, unspecified: Secondary | ICD-10-CM | POA: Insufficient documentation

## 2018-02-21 DIAGNOSIS — Z86010 Personal history of colonic polyps: Secondary | ICD-10-CM | POA: Diagnosis not present

## 2018-02-21 DIAGNOSIS — Z993 Dependence on wheelchair: Secondary | ICD-10-CM

## 2018-02-21 DIAGNOSIS — I482 Chronic atrial fibrillation, unspecified: Secondary | ICD-10-CM | POA: Diagnosis not present

## 2018-02-21 DIAGNOSIS — M2578 Osteophyte, vertebrae: Secondary | ICD-10-CM | POA: Insufficient documentation

## 2018-02-21 DIAGNOSIS — R110 Nausea: Secondary | ICD-10-CM | POA: Insufficient documentation

## 2018-02-21 DIAGNOSIS — I951 Orthostatic hypotension: Secondary | ICD-10-CM | POA: Insufficient documentation

## 2018-02-21 DIAGNOSIS — B3789 Other sites of candidiasis: Secondary | ICD-10-CM | POA: Diagnosis present

## 2018-02-21 DIAGNOSIS — Z87440 Personal history of urinary (tract) infections: Secondary | ICD-10-CM | POA: Insufficient documentation

## 2018-02-21 LAB — BRAIN NATRIURETIC PEPTIDE: B Natriuretic Peptide: 47 pg/mL (ref 0.0–100.0)

## 2018-02-21 LAB — URINALYSIS, COMPLETE (UACMP) WITH MICROSCOPIC
Bacteria, UA: NONE SEEN
Bilirubin Urine: NEGATIVE
Glucose, UA: NEGATIVE mg/dL
Hgb urine dipstick: NEGATIVE
Ketones, ur: 5 mg/dL — AB
Leukocytes, UA: NEGATIVE
Nitrite: NEGATIVE
Protein, ur: NEGATIVE mg/dL
SPECIFIC GRAVITY, URINE: 1.025 (ref 1.005–1.030)
Squamous Epithelial / HPF: NONE SEEN (ref 0–5)
pH: 5 (ref 5.0–8.0)

## 2018-02-21 LAB — BASIC METABOLIC PANEL
Anion gap: 8 (ref 5–15)
BUN: 14 mg/dL (ref 8–23)
CO2: 26 mmol/L (ref 22–32)
CREATININE: 1.03 mg/dL (ref 0.61–1.24)
Calcium: 9.2 mg/dL (ref 8.9–10.3)
Chloride: 104 mmol/L (ref 98–111)
GFR calc Af Amer: >60 mL/min (ref >60)
GFR calc non Af Amer: >60 mL/min (ref >60)
Glucose, Bld: 110 mg/dL — ABNORMAL HIGH (ref 70–99)
Potassium: 4 mmol/L (ref 3.5–5.1)
SODIUM: 138 mmol/L (ref 135–145)

## 2018-02-21 LAB — CBC
HCT: 47.8 % (ref 39.0–52.0)
Hemoglobin: 15.8 g/dL (ref 13.0–17.0)
MCH: 30.2 pg (ref 26.0–34.0)
MCHC: 33.1 g/dL (ref 30.0–36.0)
MCV: 91.4 fL (ref 80.0–100.0)
Platelets: 255 10*3/uL (ref 150–400)
RBC: 5.23 MIL/uL (ref 4.22–5.81)
RDW: 13.2 % (ref 11.5–15.5)
WBC: 7.8 10*3/uL (ref 4.0–10.5)
nRBC: 0 % (ref 0.0–0.2)

## 2018-02-21 LAB — HEPATIC FUNCTION PANEL
ALT: 37 U/L (ref 0–44)
AST: 31 U/L (ref 15–41)
Albumin: 4.1 g/dL (ref 3.5–5.0)
Alkaline Phosphatase: 57 U/L (ref 38–126)
Bilirubin, Direct: 0.1 mg/dL (ref 0.0–0.2)
Indirect Bilirubin: 0.8 mg/dL (ref 0.3–0.9)
Total Bilirubin: 0.9 mg/dL (ref 0.3–1.2)
Total Protein: 7.1 g/dL (ref 6.5–8.1)

## 2018-02-21 LAB — TROPONIN I: Troponin I: <0.03 ng/mL (ref <0.03)

## 2018-02-21 MED ORDER — LORAZEPAM 2 MG/ML IJ SOLN
INTRAMUSCULAR | Status: AC
  Administered 2018-02-21: 1 mg via INTRAVENOUS
  Filled 2018-02-21: qty 1

## 2018-02-21 MED ORDER — LORAZEPAM 2 MG/ML IJ SOLN
1.0000 mg | Freq: Once | INTRAMUSCULAR | Status: AC
  Administered 2018-02-21: 1 mg via INTRAVENOUS

## 2018-02-21 NOTE — ED Provider Notes (Signed)
-----------------------------------------   11:43 PM on 02/21/2018 -----------------------------------------  MRI brain/cervical/thoracic/lumbar spine interpreted per Dr. Dorann Lodge:  MRI head:    1. No acute intracranial process.  2. Stable moderate chronic small vessel ischemic changes.    MRI cervical spine:    1. Motion degraded examination. Short segment potential myelomalacia  at C3-4.  2. Grade 1 C3-4 and C7-T1 anterolisthesis. No acute osseous process.  3. No canal stenosis. Neural foraminal narrowing C3-4 through C7-T1:  Likely severe at C3-4 and C6-7. Given degree of motion, CT cervical  spine may be of added value.    MRI thoracic spine:    1. Motion degraded examination. No fracture or malalignment.  2. Stable degenerative change of the thoracic spine. No canal.  Moderate RIGHT T1-2 neural foraminal narrowing.    MRI lumbar spine:    1. Motion degraded examination.  2. Stable grade 1 L4-5 and L5-S1 anterolisthesis. Status post L5  laminectomies.  3. 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  4. Stable neural foraminal narrowing L2-3, L4-5 and L5-S1: Moderate  on the RIGHT at L2-3.   Updated patient and family members of MRI results.  Will discuss with hospitalist to evaluate patient in the emergency department for admission.   Paulette Blanch, MD 02/22/18 [phone]

## 2018-02-21 NOTE — ED Triage Notes (Signed)
Patient was recently treated for a UTI.  Treated with Cipro.  Patient's wife states cipro caused nausea, vomiting and now patient seems depressed.  Patient has gone from using a cane occasionally, to using a walker, to now being unable to ambulate at all today, per wife.  Patient states he finished antibiotics but is still having nausea, reports taking nausea medication today.  Denies any vomiting today.  Patient denies diarrhea.

## 2018-02-21 NOTE — ED Notes (Signed)
Difficulty ambulating

## 2018-02-21 NOTE — ED Provider Notes (Signed)
[facility] Emergency Department Provider Note   ____________________________________________   First MD Initiated Contact with Patient 02/21/18 1728     (approximate)  I have reviewed the triage vital signs and the nursing notes.   HISTORY  Chief Complaint Weakness    HPI Mark [name]. is a 77 y.o. male who reports she was recently treated with Cipro for UTI.  He got nausea and vomiting now he is  nauseated.  He today was unable to ambulate at all because of he stood up he would fall over.  He does not appear to be having any weakness he is not vomiting anymore nothing is hurting him just cannot walk.  His wife says he appears to be somewhat depressed.   Past Medical History:  Diagnosis Date  . Anxiety   . Atrial fibrillation ([place])   . Back pain, chronic   . Colon polyp   . Cyst of left kidney   . GERD (gastroesophageal reflux disease)   . Heart murmur   . Hyperlipemia   . Mitral valve prolapse   . Neuromuscular disorder ([place])   . Neuropathy   . Osteoarthritis   . Pulmonary emboli ([place])   . Spinal stenosis   . Thyroid nodule   . Vertigo     Patient Active Problem List   Diagnosis Date Noted  . Near syncope 10/24/2017  . Orthostatic hypotension 10/24/2017  . History of pulmonary embolus (PE) 08/12/2017  . PAF (paroxysmal atrial fibrillation) ([place]) 08/12/2017  . 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 Abnormal cardiovascular function study 01/07/2017  . Lightheadedness 12/23/2016  . Lower extremity numbness 12/23/2016  . Orthostatic dizziness 12/23/2016  . Weakness of both lower extremities 12/23/2016  . Arthritis 11/17/2016  . SOB (shortness of breath) 11/16/2016  . DDD (degenerative disc disease), lumbar 07/01/2016  . Lumbosacral spondylosis without myelopathy 01/07/2016  . Chronic bilateral low back pain without sciatica 10/10/2015  . Primary osteoarthritis of right shoulder 06/25/2015  . H/O adenomatous  polyp of colon 07/20/2014  . Chronic anticoagulation 10/04/2012  . Anxiety 09/11/2011  . Cyst of left kidney 09/11/2011  . GERD (gastroesophageal reflux disease) 09/11/2011  . Heart murmur 09/11/2011  . Hyperlipidemia 09/11/2011  . Neuropathy 09/11/2011  . Pulmonary embolus ([place]) 09/11/2011    Past Surgical History:  Procedure Laterality Date  . APPENDECTOMY    . BACK SURGERY    . COLONOSCOPY N/A 08/15/2014   Procedure: COLONOSCOPY;  Surgeon: [name], MD;  Location: [facility] ENDOSCOPY;  Service: Endoscopy;  Laterality: N/A;  . ESOPHAGOGASTRODUODENOSCOPY N/A 08/15/2014   Procedure: ESOPHAGOGASTRODUODENOSCOPY (EGD);  Surgeon: [name], MD;  Location: [facility] ENDOSCOPY;  Service: Endoscopy;  Laterality: N/A;  . TONSILLECTOMY      Prior to Admission medications   Medication Sig Start Date End Date Taking? Authorizing Provider  apixaban (ELIQUIS) 2.5 MG TABS tablet Take 2.5 mg by mouth 2 (two) times daily.    [provider]  Ascorbic Acid (VITAMIN C PO) Take by mouth.    [provider]  Coenzyme Q10 (CO Q 10 PO) Take by mouth.    [provider]  Cyanocobalamin (B-12 PO) Take 5,000 mg by mouth.    [provider]  famotidine (PEPCID AC) 10 MG chewable tablet Chew 20 mg by mouth 2 (two) times daily as needed for heartburn.    [provider]  finasteride (PROSCAR) 5 MG tablet  12/01/16   [provider]  FLUoxetine (PROZAC) 20  MG capsule Take 20 mg by mouth daily.    [provider]  FLUoxetine (PROZAC) 40 MG capsule Take 40 mg by mouth daily. Total 60mg  daily    [provider]  simvastatin (ZOCOR) 40 MG tablet Take 40 mg by mouth daily.    [provider]  tamsulosin (FLOMAX) 0.4 MG CAPS capsule Take 0.4 mg by mouth.    [provider]    Allergies Erythromycin; Keflex [cephalexin]; and Penicillins  No family history on file.  Social History Social History   Tobacco Use  .  Smoking status: Never Smoker  . Smokeless tobacco: Never Used  Substance Use Topics  . Alcohol use: Yes    Alcohol/week: 1.0 standard drinks    Types: 1 Glasses of wine per week  . Drug use: Not on file    Review of Systems  Constitutional: No fever/chills Eyes: No visual changes. ENT: No sore throat. Cardiovascular: Denies chest pain. Respiratory: Denies shortness of breath. Gastrointestinal: No abdominal pain.  No nausea, no vomiting.  No diarrhea.  No constipation. Genitourinary: Negative for dysuria. Musculoskeletal: Negative for back pain. Skin: Negative for rash. Neurological: Negative for headaches, focal weakness   ____________________________________________   PHYSICAL EXAM:  VITAL SIGNS: ED Triage Vitals  Enc Vitals Group     BP 02/21/18 1610 127/77     Pulse Rate 02/21/18 1610 84     Resp --      Temp 02/21/18 1610 97.6 F (36.4 C)     Temp Source 02/21/18 1610 Oral     SpO2 02/21/18 1610 94 %     Weight 02/21/18 1611 220 lb (99.8 kg)     Height 02/21/18 1611 6\' 3"  (1.905 m)     Head Circumference --      Peak Flow --      Pain Score 02/21/18 1619 0     Pain Loc --      Pain Edu? --      Excl. in [place]? --     Constitutional: Alert and oriented. Well appearing and in no acute distress. Eyes: Conjunctivae are normal. PER. EOMI. Head: Atraumatic. Nose: No congestion/rhinnorhea. Mouth/Throat: Mucous membranes are moist.  Oropharynx non-erythematous. Neck: No stridor.  Cardiovascular: Normal rate, regular rhythm. Grossly normal heart sounds.  Good peripheral circulation. Respiratory: Normal respiratory effort.  No retractions. Lungs CTAB. Gastrointestinal: Soft and nontender. No distention. No abdominal bruits. No CVA tenderness. Musculoskeletal: No lower extremity tenderness nor edema.  Neurologic:  Normal speech and language. No gross focal neurologic deficits are appreciated.  Specifically cranial nerves II through XII are intact although visual fields  were not checked cerebellar finger-nose rapid alternating movements are normal motor strength is 5/5 throughout patient does not report any numbness Skin:  Skin is warm, dry and intact. No rash noted. Psychiatric: Mood and affect are normal. Speech and behavior are normal.  ____________________________________________   LABS (all labs ordered are listed, but only abnormal results are displayed)  Labs Reviewed  BASIC METABOLIC PANEL - Abnormal; Notable for the following components:      Result Value   Glucose, Bld 110 (*)    All other components within normal limits  URINALYSIS, COMPLETE (UACMP) WITH MICROSCOPIC - Abnormal; Notable for the following components:   Color, Urine YELLOW (*)    APPearance HAZY (*)    Ketones, ur 5 (*)    All other components within normal limits  CBC  TROPONIN I  BRAIN NATRIURETIC PEPTIDE  HEPATIC FUNCTION PANEL  ____________________________________________  EKG  EKG read interpreted by me shows normal sinus rhythm rate of 74 left axis no acute ST-T wave changes specifically no change from last month ____________________________________________  RADIOLOGY  ED MD interpretation:    Official radiology report(s): Dg Chest Portable 1 View  Result Date: 02/21/2018 CLINICAL DATA:  Nausea, vomiting and depression after being treated for urinary tract infection with Cipro. EXAM: PORTABLE CHEST 1 VIEW COMPARISON:  10/23/2017 FINDINGS: The heart size and mediastinal contours are within normal limits. Both lungs are clear. Stable mild elevation or eventration of the right hemidiaphragm. 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 IMPRESSION: No active disease. Electronically Signed   By: Ashley Royalty M.D.   On: 02/21/2018 18:15    ____________________________________________   PROCEDURES  Procedure(s) performed:   Procedures  Critical Care performed:   ____________________________________________   INITIAL IMPRESSION /  ASSESSMENT AND PLAN / ED COURSE Labs are back and okay at this point patient's family says the patient was sent here for an MRI of his spine.  On review of the old records this is true.  His doctor sent him here for an MRI of the spine.  I will order this.  Maybe this will tell us why he is weak.       Clinical Course as of Feb 21 2313  Mon Feb 21, 2018  1845 Hepatic function panel [PM]  1845 Hepatic function panel [PM]  1845 Hepatic function panel [PM]    Clinical Course User Index [PM] Nena Polio, MD   Patient is an MRI now.  If something turns up there will call the appropriate consultant neurology neurosurgery etc. anticipate admitting the patient or transferring and depending on the results.  Patient cannot walk Been going on for couple days.  Dr. Beather Arbour will monitor the results  ____________________________________________   FINAL CLINICAL IMPRESSION(S) / ED DIAGNOSES  Final diagnoses:  Weakness     ED Discharge Orders    None       Note:  This document was prepared using Dragon voice recognition software and may include unintentional dictation errors.    Nena Polio, MD 02/21/18 [phone]

## 2018-02-22 ENCOUNTER — Other Ambulatory Visit: Payer: Self-pay

## 2018-02-22 DIAGNOSIS — R531 Weakness: Secondary | ICD-10-CM

## 2018-02-22 DIAGNOSIS — R2681 Unsteadiness on feet: Secondary | ICD-10-CM | POA: Diagnosis not present

## 2018-02-22 DIAGNOSIS — Z993 Dependence on wheelchair: Secondary | ICD-10-CM

## 2018-02-22 DIAGNOSIS — R269 Unspecified abnormalities of gait and mobility: Secondary | ICD-10-CM

## 2018-02-22 LAB — TSH: TSH: 0.866 u[IU]/mL (ref 0.350–4.500)

## 2018-02-22 MED ORDER — VITAMIN B-12 1000 MCG PO TABS: 5000.0000 ug | ORAL_TABLET | Freq: Every day | ORAL | Status: DC

## 2018-02-22 MED ORDER — CO Q 10 10 MG PO CAPS: 1.0000 | ORAL_CAPSULE | Freq: Every day | ORAL | Status: DC

## 2018-02-22 MED ORDER — FLUOXETINE HCL 20 MG PO CAPS
60.0000 mg | ORAL_CAPSULE | Freq: Every day | ORAL | Status: DC
  Filled 2018-02-22: qty 3

## 2018-02-22 MED ORDER — ONDANSETRON HCL 4 MG PO TABS: 4.0000 mg | ORAL_TABLET | Freq: Four times a day (QID) | ORAL | Status: DC | PRN

## 2018-02-22 MED ORDER — BUPROPION HCL 75 MG PO TABS
150.0000 mg | ORAL_TABLET | Freq: Two times a day (BID) | ORAL | Status: DC
  Filled 2018-02-22 (×2): qty 2

## 2018-02-22 MED ORDER — TAMSULOSIN HCL 0.4 MG PO CAPS: 0.4000 mg | ORAL_CAPSULE | Freq: Every day | ORAL | Status: DC

## 2018-02-22 MED ORDER — ONDANSETRON HCL 4 MG/2ML IJ SOLN: 4.0000 mg | Freq: Four times a day (QID) | INTRAMUSCULAR | Status: DC | PRN

## 2018-02-22 MED ORDER — FINASTERIDE 5 MG PO TABS: 5.0000 mg | ORAL_TABLET | Freq: Every day | ORAL | Status: DC

## 2018-02-22 MED ORDER — ACETAMINOPHEN 650 MG RE SUPP: 650.0000 mg | Freq: Four times a day (QID) | RECTAL | Status: DC | PRN

## 2018-02-22 MED ORDER — FLUOXETINE HCL 20 MG PO CAPS: 20.0000 mg | ORAL_CAPSULE | Freq: Every day | ORAL | Status: DC

## 2018-02-22 MED ORDER — DOCUSATE SODIUM 100 MG PO CAPS: 100.0000 mg | ORAL_CAPSULE | Freq: Two times a day (BID) | ORAL | Status: DC

## 2018-02-22 MED ORDER — ACETAMINOPHEN 325 MG PO TABS: 650.0000 mg | ORAL_TABLET | Freq: Four times a day (QID) | ORAL | Status: DC | PRN

## 2018-02-22 MED ORDER — NYSTATIN 100000 UNIT/GM EX POWD
Freq: Two times a day (BID) | CUTANEOUS | Status: DC
  Filled 2018-02-22: qty 15

## 2018-02-22 MED ORDER — APIXABAN 2.5 MG PO TABS: 2.5000 mg | ORAL_TABLET | Freq: Two times a day (BID) | ORAL | Status: DC

## 2018-02-22 MED ORDER — SIMVASTATIN 20 MG PO TABS: 40.0000 mg | ORAL_TABLET | Freq: Every day | ORAL | Status: DC

## 2018-02-22 MED ORDER — PANTOPRAZOLE SODIUM 40 MG PO TBEC: 40.0000 mg | DELAYED_RELEASE_TABLET | Freq: Every day | ORAL | Status: DC

## 2018-02-22 MED ORDER — B-12 5000 MCG PO CAPS: 5000.0000 mg | ORAL_CAPSULE | Freq: Every day | ORAL | 0 refills | Status: AC

## 2018-02-22 NOTE — Progress Notes (Signed)
Patient ready for discharge home with wife. [place] arranged. Wife at bedside. Reviewed all discharge instructions, f/u appointments and prescriptions with paitient and wife [name]

## 2018-02-22 NOTE — Evaluation (Signed)
Occupational Therapy Evaluation Patient Details Name: Mark [name]. MRN: [id number] DOB: [birth year] Today's Date: 02/22/2018    History of Present Illness Pt is 77 year old male with hx of recent UTI and fall about a week ago and now has weakness in LEs with MRI negative for CVA but imaging revealed C3-4 and C7-T1 narrowing and moderate narrowing at T1-2. PMH: mitral valve prolapse, PE, Factor V Leiden thrombophilia.   Clinical Impression   Pt is 77 year old male who presents with new onset of LE weakness and fall a week ago.  MRI results as stated above with no new CVA.  He presents with good UB strength with intact fine motor skills but is impulsive and leans to L and has decreased safety awareness.  His wife was assisting with donning and doffing socks and pants as needed for the last week due to weakness and UTI but prior to this he was independent in ADLs and going to the gym twice a week with his wife. Tasks requiring planning are difficult and he is impulsive with decreased safety awareness. Rec supervision 24/7 for decreased balance, leaning to L and decreased safety awareness.  Rec he switch sides of bed he sleeps in since he is further away from bathroom and FWW might not fit in his side of bed.  Also rec a large urinal to decrease amount of trips to the bathroom.  Rec getting dressed sitting in chair with back in bedroom vs on EOB Rec continued OT while in hospital to continue to work on increasing independence in ADLs, balance and functional mobility training, and family ed and training and OT [facility]  after discharge.    Follow Up Recommendations  Home health OT    Equipment Recommendations       Recommendations for Other Services       Precautions / Restrictions Precautions Precautions: Fall Precaution Comments: leans to L and decreased balance standing Restrictions Weight Bearing Restrictions: No      Mobility Bed Mobility                  Transfers                       Balance                                           ADL either performed or assessed with clinical judgement   ADL Overall ADL's : Needs assistance/impaired Eating/Feeding: Independent;Set up   Grooming: Wash/dry hands;Wash/dry face;Oral care;Set up;Independent           Upper Body Dressing : Independent;Set up   Lower Body Dressing: Minimal assistance;Set up;Cueing for safety;Sit to/from stand Lower Body Dressing Details (indicate cue type and reason): tends to lean to L when standing and cues to move slower and find center of balance before standing               General ADL Comments: REc supervision 24/7 for decreased balance, leaning to L and decreased safety awareness.  Rec he switch sides of bed he sleeps in since he is further away from bathroom and FWW might not fit in his side of bed.  Also rec a large urinal to decrease amount of trips to the bathroom.  Rec getting dressed sitting in chair with back in bedroom vs on EOB.  Vision Baseline Vision/History: Wears glasses Wears Glasses: At all times Patient Visual Report: No change from baseline       Perception     Praxis      Pertinent Vitals/Pain Pain Assessment: Faces Faces Pain Scale: Hurts a little bit Pain Location: pt stated he has increased pain in L knee and back when standing but did not state any change during session. Pain Descriptors / Indicators: Discomfort Pain Intervention(s): Monitored during session     Hand Dominance Right   Extremity/Trunk Assessment Upper Extremity Assessment Upper Extremity Assessment: Overall WFL for tasks assessed   Lower Extremity Assessment Lower Extremity Assessment: Defer to PT evaluation       Communication Communication Communication: No difficulties   Cognition Arousal/Alertness: Awake/alert Behavior During Therapy: Impulsive;Flat affect Overall Cognitive Status: History of cognitive impairments - at baseline Area  of Impairment: Safety/judgement;Awareness                         Safety/Judgement: Decreased awareness of safety     General Comments: Pt was talking about events that occurred in August when asked about currently function and his wife did not leave him unsupervised at home and was assisting but stated that cognition changes have started since he had UTI.   General Comments       Exercises     Shoulder Instructions      Home Living Family/patient expects to be discharged to:: Private residence Living Arrangements: Spouse/significant other Available Help at Discharge: Family Type of Home: House Home Access: Stairs to enter Technical brewer of Steps: 2 Entrance Stairs-Rails: None Home Layout: One level     Bathroom Shower/Tub: Walk-in shower;Door   Bathroom Toilet: Handicapped height Bathroom Accessibility: Yes   Home Equipment: Environmental consultant - 2 wheels;Cane - quad;Shower seat - built in;Grab bars - tub/shower;Grab bars - toilet   Additional Comments: family is considering installing a ramp or use a portable ramp       Prior Functioning/Environment Level of Independence: Independent with assistive device(s)        Comments: Pt was independent ambulating and getting dressed with min assist from his wife for pants and socks recently.  She does not leave him at home unsupervised.        OT Problem List: Impaired balance (sitting and/or standing);Decreased cognition;Pain;Decreased safety awareness      OT Treatment/Interventions: Self-care/ADL training;Patient/family education;Balance training    OT Goals(Current goals can be found in the care plan section) Acute Rehab OT Goals Patient Stated Goal: to go home OT Goal Formulation: With patient/family Time For Goal Achievement: 03/08/18 Potential to Achieve Goals: Good ADL Goals Pt Will Perform Lower Body Dressing: with set-up;with min assist;sit to/from stand Pt Will Transfer to Toilet: with  supervision;with min guard assist;stand pivot transfer  OT Frequency: Min 1X/week   Barriers to D/C:            Co-evaluation              AM-PAC OT "6 Clicks" Daily Activity     Outcome Measure Help from another person eating meals?: None Help from another person taking care of personal grooming?: None Help from another person toileting, which includes using toliet, bedpan, or urinal?: A Little Help from another person bathing (including washing, rinsing, drying)?: A Little Help from another person to put on and taking off regular upper body clothing?: None Help from another person to put on and taking off regular lower body clothing?: A  Little 6 Click Score: 21   End of Session Equipment Utilized During Treatment: Gait belt  Activity Tolerance: Patient tolerated treatment well Patient left: in chair;with call bell/phone within reach;with chair alarm set;with family/visitor present  OT Visit Diagnosis: Unsteadiness on feet (R26.81);History of falling (Z91.81);Pain Pain - Right/Left: Left Pain - part of body: Knee                Time: 1000-1030 OT Time Calculation (min): 30 min Charges:  OT General Charges $OT Visit: 1 Visit OT Evaluation $OT Eval Low Complexity: 1 Low OT Treatments $Self Care/Home Management : 8-22 mins  Chrys Racer, OTR/L ascom [phone] 02/22/18, 10:53 AM

## 2018-02-22 NOTE — Consult Note (Signed)
Reason for Consult: Tremors Referring Physician:   CC: Tremors  HPI: Mark [name]. is an 77 y.o. male with past medical history of atrial fibrillation on anticoagulation, depression and anxiety, hyperlipidemia, GERD, lumbar stenosis with neurogenic claudication, pulmonary embolism, sensory neuropathy, vertigo, and recurrent UTI presenting to the ED on 02/21/2018 with chief complaints of gait difficulty and a fall a week ago.  Per patient's wife patient was recently treated for UTI with ciprofloxacin, however since starting Cipro patient state that he has developed weakness, nausea and vomiting with worsening depression.  Patient states he was walking without a walker however since starting the medication he has been unable to walk, has worsening back pain and inability to stand without feeling of as if he may fall over.  Patient's wife has also noticed resting tremors in bilateral upper and lower extremities.  He does have history of restless leg syndrome and was on Requip which seemed to help. Patient states tremors are predominantly in his bilateral upper and lower extremities, endorses difficulty in initiating movement, difficulty with walking and posture.  Tremor seems to be worse at rest and with emotional stress.  Denies drooling, voice changes, rigidity, bradykinesia, loss of sense of smell, or reduced facial expression.  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  He has had work-up with MRI of the brain which showed no acute intracranial abnormality.  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  Past Medical History:  Diagnosis Date  . Anxiety   . Atrial fibrillation ([place])   . Back pain, chronic   . Colon polyp   . Cyst of left kidney   . GERD  (gastroesophageal reflux disease)   . Heart murmur   . Hyperlipemia   . Mitral valve prolapse   . Neuromuscular disorder ([place])   . Neuropathy   . Osteoarthritis   . Pulmonary emboli ([place])   . Spinal stenosis   . Thyroid nodule   . Vertigo     Past Surgical History:  Procedure Laterality Date  . APPENDECTOMY    . BACK SURGERY    . COLONOSCOPY N/A 08/15/2014   Procedure: COLONOSCOPY;  Surgeon: [name], MD;  Location: [facility] ENDOSCOPY;  Service: Endoscopy;  Laterality: N/A;  . ESOPHAGOGASTRODUODENOSCOPY N/A 08/15/2014   Procedure: ESOPHAGOGASTRODUODENOSCOPY (EGD);  Surgeon: [name], MD;  Location: [facility] ENDOSCOPY;  Service: Endoscopy;  Laterality: N/A;  . TONSILLECTOMY      No family history on file.  Social History:  reports that he has never smoked. He has never used smokeless tobacco. He reports that he drinks about 1.0 standard drinks of alcohol per week. His drug history is not on file.  Allergies  Allergen Reactions  . Erythromycin Swelling  . Keflex [Cephalexin] Swelling  . Penicillins Swelling    Has patient had a PCN reaction causing immediate rash, facial/tongue/throat swelling, SOB or lightheadedness with hypotension: Yes Has patient had a PCN reaction causing severe rash involving mucus membranes or skin necrosis: No Has patient had a PCN reaction that required hospitalization: Unknown Has patient had a PCN reaction occurring within the last 10 years: No If all of the above answers are "NO", then may proceed with Cephalosporin use.     Medications:  I have reviewed the patient's current medications. Prior to Admission:  Facility-Administered Medications Prior to Admission  Medication Dose Route Frequency Provider Last Rate Last Dose  . nystatin (MYCOSTATIN/NYSTOP) topical powder   Topical BID Hollice Espy, MD       Medications Prior to Admission  Medication Sig Dispense Refill Last Dose  . apixaban (ELIQUIS) 2.5 MG TABS tablet Take 2.5 mg by  mouth 2 (two) times daily.   02/21/2018 at am  . buPROPion (WELLBUTRIN) 75 MG tablet Take 150 mg by mouth 2 (two) times daily.   02/21/2018 at am  . Coenzyme Q10 (CO Q 10 PO) Take 1 Dose by mouth daily.    02/21/2018 at Unknown time  . Cyanocobalamin (B-12 PO) Take 5,000 mg by mouth.   02/21/2018 at Unknown time  . esomeprazole (NEXIUM) 40 MG capsule Take 40 mg by mouth 3 (three) times a week.     . finasteride (PROSCAR) 5 MG tablet Take 5 mg by mouth daily.    02/21/2018 at Unknown time  . FLUoxetine (PROZAC) 20 MG capsule Take 20 mg by mouth daily.   02/21/2018 at Unknown time  . FLUoxetine (PROZAC) 40 MG capsule Take 40 mg by mouth daily. Total 60mg  daily   02/21/2018 at Unknown time  . ondansetron (ZOFRAN-ODT) 4 MG disintegrating tablet Take 4 mg by mouth every 8 (eight) hours as needed.   prn at prn  . simvastatin (ZOCOR) 40 MG tablet Take 40 mg by mouth at bedtime.    Past Week at Unknown time  . tamsulosin (FLOMAX) 0.4 MG CAPS capsule Take 0.4 mg by mouth daily.    02/21/2018 at Unknown time   Scheduled: . apixaban  2.5 mg Oral BID  . buPROPion  150 mg Oral BID  . docusate sodium  100 mg Oral BID  . finasteride  5 mg Oral Daily  . FLUoxetine  60 mg Oral Daily  . nystatin   Topical BID  . pantoprazole  40 mg Oral Daily  . simvastatin  40 mg Oral QHS  . tamsulosin  0.4 mg Oral Daily  . vitamin B-12  5,000 mcg Oral Q1400    ROS: History obtained from the patient   General ROS: negative for - chills, fatigue, fever, night sweats, weight gain or weight loss Psychological ROS: negative for - behavioral disorder, hallucinations, memory difficulties, mood swings or suicidal ideation Ophthalmic ROS: negative for - blurry vision, double vision, eye pain or loss of vision ENT ROS: negative for - epistaxis, nasal discharge, oral lesions, sore throat, tinnitus or vertigo Allergy and Immunology ROS: negative for - hives or itchy/watery eyes Hematological and Lymphatic ROS: negative for - bleeding  problems, bruising or swollen lymph nodes Endocrine ROS: negative for - galactorrhea, hair pattern changes, polydipsia/polyuria or temperature intolerance Respiratory ROS: negative for - cough, hemoptysis, shortness of breath or wheezing Cardiovascular ROS: negative for - chest pain, dyspnea on exertion, edema or irregular heartbeat Gastrointestinal ROS: negative for - abdominal pain, diarrhea, hematemesis, nausea/vomiting or stool incontinence Genito-Urinary ROS: negative for - dysuria, hematuria, incontinence or urinary frequency/urgency Musculoskeletal ROS: negative for - joint swelling or muscular weakness Neurological ROS: as noted in HPI Dermatological ROS: negative for rash and skin lesion changes  Physical Examination: Blood pressure 116/73, pulse 78, temperature 98 F (36.7 C), temperature source Oral, resp. rate 18, height 6\' 3"  (1.905 m), weight 98.8 kg, SpO2 97 %.  HEENT-  Normocephalic, no lesions, without obvious abnormality.  Normal external eye and conjunctiva.  Normal TM's bilaterally.  Normal auditory canals and external ears. Normal external  nose, mucus membranes and septum.  Normal pharynx. Cardiovascular- S1, S2 normal, pulses palpable throughout   Lungs- chest clear, no wheezing, rales, normal symmetric air entry Abdomen- soft, non-tender; bowel sounds normal; no masses,  no organomegaly Extremities- no edema Lymph-no adenopathy palpable Musculoskeletal-no joint tenderness, deformity or swelling Skin-warm and dry, no hyperpigmentation, vitiligo, or suspicious lesions  Neurological Exam   Mental Status: Alert, oriented, thought content appropriate.  Speech fluent without evidence of aphasia.  Able to follow 3 step commands without difficulty. Attention span and concentration seemed appropriate  Cranial Nerves: II: Discs flat bilaterally; Visual fields grossly normal, pupils equal, round, reactive to light and accommodation III,IV, VI: ptosis not present, extra-ocular  motions intact bilaterally V,VII: smile symmetric, facial light touch sensation intact VIII: hearing normal bilaterally IX,X: gag reflex present XI: bilateral shoulder shrug XII: midline tongue extension Motor: Right :  Upper extremity   5/5 Without pronator drift      Left: Upper extremity   5/5 without pronator drift Right:   Lower extremity   5/5                                          Left: Lower extremity   5/5 Tone and bulk:normal tone throughout; no atrophy noted Sensory: Pinprick and light touch intact bilaterally Deep Tendon Reflexes: 2+ and symmetric throughout Plantars: Right: mute                              Left: mute Cerebellar: Finger-to-nose testing intact bilaterally. Heel to shin testing normal bilaterally Gait: not tested due to safety concerns  Data Reviewed  Laboratory Studies:   Basic Metabolic Panel: Recent Labs  Lab 02/21/18 1625  NA 138  K 4.0  CL 104  CO2 26  GLUCOSE 110*  BUN 14  CREATININE 1.03  CALCIUM 9.2    Liver Function Tests: Recent Labs  Lab 02/21/18 1625  AST 31  ALT 37  ALKPHOS 57  BILITOT 0.9  PROT 7.1  ALBUMIN 4.1   No results for input(s): LIPASE, AMYLASE in the last 168 hours. No results for input(s): AMMONIA in the last 168 hours.  CBC: Recent Labs  Lab 02/21/18 1625  WBC 7.8  HGB 15.8  HCT 47.8  MCV 91.4  PLT 255    Cardiac Enzymes: Recent Labs  Lab 02/21/18 1625  TROPONINI <0.03    BNP: Invalid input(s): POCBNP  CBG: No results for input(s): GLUCAP in the last 168 hours.  Microbiology: Results for orders placed or performed in visit on 02/03/18  Microscopic Examination     Status: Abnormal   Collection Time: 02/03/18  9:47 AM  Result Value Ref Range Status   WBC, UA 0-5 0 - 5 /hpf Final   RBC, UA None seen 0 - 2 /hpf Final   Epithelial Cells (non renal) None seen 0 - 10 /hpf Final   Mucus, UA Present (A) Not Estab. Final   Bacteria, UA Moderate (A) None seen/Few Final  CULTURE, URINE  COMPREHENSIVE     Status: None   Collection Time: 02/03/18 10:33 AM  Result Value Ref Range Status   Urine Culture, Comprehensive Final report  Final   Organism ID, Bacteria Comment  Final    Comment: No growth in 36 - 48 hours.    Coagulation Studies: No results for input(s): LABPROT,  INR in the last 72 hours.  Urinalysis:  Recent Labs  Lab 02/21/18 1626  COLORURINE YELLOW*  LABSPEC 1.025  PHURINE 5.0  GLUCOSEU NEGATIVE  HGBUR NEGATIVE  BILIRUBINUR NEGATIVE  KETONESUR 5*  PROTEINUR NEGATIVE  NITRITE NEGATIVE  LEUKOCYTESUR NEGATIVE    Lipid Panel:  No results found for: CHOL, TRIG, HDL, CHOLHDL, VLDL, LDLCALC  HgbA1C: No results found for: HGBA1C  Urine Drug Screen:  No results found for: LABOPIA, COCAINSCRNUR, LABBENZ, AMPHETMU, THCU, LABBARB  Alcohol Level: No results for input(s): ETH in the last 168 hours.  Other results: EKG: normal EKG, normal sinus rhythm, unchanged from previous tracings.  Imaging: Mr Brain Wo Contrast  Result Date: 02/21/2018 CLINICAL DATA:  Nausea, vomiting and weakness. Recent treated urinary tract infection. History of atrial fibrillation, hyperlipidemia, spinal stenosis, spine surgery. EXAM: MRI CERVICAL, THORACIC AND LUMBAR SPINE WITHOUT CONTRAST MRI HEAD WITHOUT CONTRAST TECHNIQUE: Multiplanar and multiecho pulse sequences of the cervical spine, to include the craniocervical junction and cervicothoracic junction, and thoracic and lumbar spine, were obtained without intravenous contrast. Multiplanar, multiecho pulse sequences of the brain and surrounding structures were obtained without intravenous contrast. COMPARISON:  None. MRI head November 21, 2017 and MRI thoracic spine November 12, 2016 and MRI lumbar spine April 21, 2016. FINDINGS: MRI HEAD FINDINGS INTRACRANIAL CONTENTS: No reduced diffusion to suggest acute ischemia or typical infection. No susceptibility artifact to suggest hemorrhage. The ventricles and sulci are normal for patient's  age. Patchy to confluent supratentorial white matter FLAIR T2 hyperintensities. No suspicious parenchymal signal, masses, mass effect. No abnormal extra-axial fluid collections. No extra-axial masses. VASCULAR: Normal major intracranial vascular flow voids present at skull base. SKULL AND UPPER CERVICAL SPINE: No abnormal sellar expansion. No suspicious calvarial bone marrow signal. Craniocervical junction maintained. SINUSES/ORBITS: The mastoid air-cells and included paranasal sinuses are well-aerated.The included ocular globes and orbital contents are non-suspicious. OTHER: None. MRI CERVICAL SPINE FINDINGS-moderately motion degraded sagittal sequences, axial sequences are moderate to severely motion degraded. ALIGNMENT: Straightened cervical lordosis. Grade 1 C3-4 anterolisthesis and C7-T1 anterolisthesis. VERTEBRAE/DISCS: Vertebral bodies are intact. Moderate C3-4 disc height loss with RIGHT T1, bright T2 signal within the disc, no STIR signal abnormality contiguous with LEFT facet, most compatible mineralization. Severe C5-6, C6-7 and moderate C7-T1 disc height loss, and disc desiccation compatible with degenerative discs. Moderate C5-6 and C6-7 subacute discogenic endplate changes. CORD: Linear bright STIR signal at C3-4 not localized on remaining sequences, potentially due to motion. POSTERIOR FOSSA, VERTEBRAL ARTERIES, PARASPINAL TISSUES: No MR findings of ligamentous injury. Vertebral artery flow voids present. Included posterior fossa and paraspinal soft tissues are normal. DISC LEVELS: Limited assessment due to motion. C2-3: Annular bulging, uncovertebral hypertrophy and severe RIGHT, moderate LEFT facet arthropathy. No canal stenosis or neural foraminal narrowing. C3-4: Anterolisthesis. Small broad-based disc bulge, uncovertebral hypertrophy in severe LEFT, mild RIGHT facet arthropathy. No canal stenosis. Moderate RIGHT and severe suspected LEFT neural foraminal narrowing. C4-5: Small broad-based disc  osteophyte and vertebral hypertrophy and severe LEFT and moderate RIGHT facet arthropathy. No canal stenosis. Moderate suspected LEFT neural foraminal narrowing. C5-6: Small broad-based disc bulge, uncovertebral hypertrophy and moderate LEFT, mild RIGHT facet arthropathy. No canal stenosis. Moderate suspected bilateral neural foraminal narrowing. C6-7: Small broad-based disc bulge, uncovertebral hypertrophy and mild facet arthropathy. No canal stenosis. Severe suspected neural narrowing. C7-T1: Anterolisthesis. Annular bulging and severe RIGHT moderate LEFT facet arthropathy. No canal. Mild moderate RIGHT neural foraminal narrowing. MRI THORACIC SPINE FINDINGS-moderately motion degraded examination. ALIGNMENT: Maintenance of the thoracic kyphosis. No malalignment.  VERTEBRAE/DISCS: Vertebral bodies are intact. Advanced upper thoracic disc height loss with bridging bone marrow signal compatible with arthrodesis. Multilevel moderate to severe disc height loss with disc desiccation compatible with degenerative discs. T6 acute discogenic endplate changes versus Schmorl's node. No suspicious bone marrow signal. CORD: Limited assessment spinal cord due to motion, no syrinx or definite myelomalacia. PREVERTEBRAL AND PARASPINAL SOFT TISSUES:  Non suspicious. DISC LEVELS: Small T4-5 central disc protrusion. No canal stenosis. Moderate RIGHT T1-2 neural foraminal narrowing. MRI LUMBAR SPINE FINDINGS-moderately motion degraded axial sequences. SEGMENTATION: For the purposes of this report, the last well-formed intervertebral disc is reported as L5-S1. Transitional anatomy with partially sacralized L5 vertebral body, consistent with prior MRI. ALIGNMENT: Maintained lumbar lordosis. Minimal stable grade 1 L4-5 anterolisthesis and grade 1 L5-S1 anterolisthesis. VERTEBRAE:Vertebral bodies are intact. Similar moderate L2-3 disc height loss, mild at L4-5. Diffuse disc desiccation multilevel mild chronic discogenic endplate changes.  L4 inferior endplate acute Schmorl's node. Focal fat versus hemangioma RIGHT L5 pedicle. No suspicious bone marrow signal. CONUS MEDULLARIS AND CAUDA EQUINA: Conus medullaris terminates at L1 and demonstrates normal morphology and signal characteristics. Limited assessment of cauda equina due to motion. PARASPINAL AND OTHER SOFT TISSUES: Non suspicious. Severe paraspinal muscle atrophy and below the level surgical intervention. DISC LEVELS: L1-2: No disc bulge, canal stenosis nor neural foraminal narrowing. Mild facet arthropathy. L2-3: Small broad-based disc bulge. Moderate facet arthropathy and ligamentum flavum redundancy. No canal stenosis. Moderate RIGHT and mild LEFT neural foraminal narrowing. L3-4: Annular bulging. Severe facet arthropathy and ligamentum flavum redundancy. Mild canal stenosis. No neural foraminal narrowing. L4-5: Anterolisthesis. Annular bulging. Moderate to severe facet arthropathy with trace facet effusions. 9 mm RIGHT facet synovial cyst within paraspinal soft tissues. Moderate canal stenosis. Mild bilateral neural foraminal narrowing. L5-S1: Posterior decompression. Anterolisthesis. Severe facet arthropathy without canal stenosis. Mild-to-moderate bilateral neural foraminal narrowing. IMPRESSION: MRI head: 1. No acute intracranial process. 2. Stable moderate chronic small vessel ischemic changes. MRI cervical spine: 1. Motion degraded examination. 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 2. Grade 1 C3-4 and C7-T1 anterolisthesis. No acute osseous process. 3. No canal stenosis. Neural foraminal narrowing C3-4 through C7-T1: Likely severe at C3-4 and C6-7. Given degree of motion, CT cervical spine may be of added value. MRI thoracic spine: 1. Motion degraded examination.  No fracture or malalignment. 2. Stable degenerative change of the thoracic spine. No canal. Moderate RIGHT T1-2 neural foraminal narrowing. MRI lumbar spine: 1. Motion degraded examination. 2. Stable grade 1 L4-5 and  L5-S1 anterolisthesis. Status post L5 laminectomies. 3. 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 4. Stable neural foraminal narrowing L2-3, L4-5 and L5-S1: Moderate on the RIGHT at L2-3. Electronically Signed   By: Elon Alas M.D.   On: 02/21/2018 23:24   Mr Cervical Spine Wo Contrast  Result Date: 02/21/2018 CLINICAL DATA:  Nausea, vomiting and weakness. Recent treated urinary tract infection. History of atrial fibrillation, hyperlipidemia, spinal stenosis, spine surgery. EXAM: MRI CERVICAL, THORACIC AND LUMBAR SPINE WITHOUT CONTRAST MRI HEAD WITHOUT CONTRAST TECHNIQUE: Multiplanar and multiecho pulse sequences of the cervical spine, to include the craniocervical junction and cervicothoracic junction, and thoracic and lumbar spine, were obtained without intravenous contrast. Multiplanar, multiecho pulse sequences of the brain and surrounding structures were obtained without intravenous contrast. COMPARISON:  None. MRI head November 21, 2017 and MRI thoracic spine November 12, 2016 and MRI lumbar spine April 21, 2016. FINDINGS: MRI HEAD FINDINGS INTRACRANIAL CONTENTS: No reduced diffusion to suggest acute ischemia or typical infection. No susceptibility  artifact to suggest hemorrhage. The ventricles and sulci are normal for patient's age. Patchy to confluent supratentorial white matter FLAIR T2 hyperintensities. No suspicious parenchymal signal, masses, mass effect. No abnormal extra-axial fluid collections. No extra-axial masses. VASCULAR: Normal major intracranial vascular flow voids present at skull base. SKULL AND UPPER CERVICAL SPINE: No abnormal sellar expansion. No suspicious calvarial bone marrow signal. Craniocervical junction maintained. SINUSES/ORBITS: The mastoid air-cells and included paranasal sinuses are well-aerated.The included ocular globes and orbital contents are non-suspicious. OTHER: None. MRI CERVICAL SPINE FINDINGS-moderately motion degraded sagittal sequences, axial  sequences are moderate to severely motion degraded. ALIGNMENT: Straightened cervical lordosis. Grade 1 C3-4 anterolisthesis and C7-T1 anterolisthesis. VERTEBRAE/DISCS: Vertebral bodies are intact. Moderate C3-4 disc height loss with RIGHT T1, bright T2 signal within the disc, no STIR signal abnormality contiguous with LEFT facet, most compatible mineralization. Severe C5-6, C6-7 and moderate C7-T1 disc height loss, and disc desiccation compatible with degenerative discs. Moderate C5-6 and C6-7 subacute discogenic endplate changes. CORD: Linear bright STIR signal at C3-4 not localized on remaining sequences, potentially due to motion. POSTERIOR FOSSA, VERTEBRAL ARTERIES, PARASPINAL TISSUES: No MR findings of ligamentous injury. Vertebral artery flow voids present. Included posterior fossa and paraspinal soft tissues are normal. DISC LEVELS: Limited assessment due to motion. C2-3: Annular bulging, uncovertebral hypertrophy and severe RIGHT, moderate LEFT facet arthropathy. No canal stenosis or neural foraminal narrowing. C3-4: Anterolisthesis. Small broad-based disc bulge, uncovertebral hypertrophy in severe LEFT, mild RIGHT facet arthropathy. No canal stenosis. Moderate RIGHT and severe suspected LEFT neural foraminal narrowing. C4-5: Small broad-based disc osteophyte and vertebral hypertrophy and severe LEFT and moderate RIGHT facet arthropathy. No canal stenosis. Moderate suspected LEFT neural foraminal narrowing. C5-6: Small broad-based disc bulge, uncovertebral hypertrophy and moderate LEFT, mild RIGHT facet arthropathy. No canal stenosis. Moderate suspected bilateral neural foraminal narrowing. C6-7: Small broad-based disc bulge, uncovertebral hypertrophy and mild facet arthropathy. No canal stenosis. Severe suspected neural narrowing. C7-T1: Anterolisthesis. Annular bulging and severe RIGHT moderate LEFT facet arthropathy. No canal. Mild moderate RIGHT neural foraminal narrowing. MRI THORACIC SPINE  FINDINGS-moderately motion degraded examination. ALIGNMENT: Maintenance of the thoracic kyphosis. No malalignment. VERTEBRAE/DISCS: Vertebral bodies are intact. Advanced upper thoracic disc height loss with bridging bone marrow signal compatible with arthrodesis. Multilevel moderate to severe disc height loss with disc desiccation compatible with degenerative discs. T6 acute discogenic endplate changes versus Schmorl's node. No suspicious bone marrow signal. CORD: Limited assessment spinal cord due to motion, no syrinx or definite myelomalacia. PREVERTEBRAL AND PARASPINAL SOFT TISSUES:  Non suspicious. DISC LEVELS: Small T4-5 central disc protrusion. No canal stenosis. Moderate RIGHT T1-2 neural foraminal narrowing. MRI LUMBAR SPINE FINDINGS-moderately motion degraded axial sequences. SEGMENTATION: For the purposes of this report, the last well-formed intervertebral disc is reported as L5-S1. Transitional anatomy with partially sacralized L5 vertebral body, consistent with prior MRI. ALIGNMENT: Maintained lumbar lordosis. Minimal stable grade 1 L4-5 anterolisthesis and grade 1 L5-S1 anterolisthesis. VERTEBRAE:Vertebral bodies are intact. Similar moderate L2-3 disc height loss, mild at L4-5. Diffuse disc desiccation multilevel mild chronic discogenic endplate changes. L4 inferior endplate acute Schmorl's node. Focal fat versus hemangioma RIGHT L5 pedicle. No suspicious bone marrow signal. CONUS MEDULLARIS AND CAUDA EQUINA: Conus medullaris terminates at L1 and demonstrates normal morphology and signal characteristics. Limited assessment of cauda equina due to motion. PARASPINAL AND OTHER SOFT TISSUES: Non suspicious. Severe paraspinal muscle atrophy and below the level surgical intervention. DISC LEVELS: L1-2: No disc bulge, canal stenosis nor neural foraminal narrowing. Mild facet arthropathy. L2-3: Small broad-based disc bulge.  Moderate facet arthropathy and ligamentum flavum redundancy. No canal stenosis. Moderate  RIGHT and mild LEFT neural foraminal narrowing. L3-4: Annular bulging. Severe facet arthropathy and ligamentum flavum redundancy. Mild canal stenosis. No neural foraminal narrowing. L4-5: Anterolisthesis. Annular bulging. Moderate to severe facet arthropathy with trace facet effusions. 9 mm RIGHT facet synovial cyst within paraspinal soft tissues. Moderate canal stenosis. Mild bilateral neural foraminal narrowing. L5-S1: Posterior decompression. Anterolisthesis. Severe facet arthropathy without canal stenosis. Mild-to-moderate bilateral neural foraminal narrowing. IMPRESSION: MRI head: 1. No acute intracranial process. 2. Stable moderate chronic small vessel ischemic changes. MRI cervical spine: 1. Motion degraded examination. 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 2. Grade 1 C3-4 and C7-T1 anterolisthesis. No acute osseous process. 3. No canal stenosis. Neural foraminal narrowing C3-4 through C7-T1: Likely severe at C3-4 and C6-7. Given degree of motion, CT cervical spine may be of added value. MRI thoracic spine: 1. Motion degraded examination.  No fracture or malalignment. 2. Stable degenerative change of the thoracic spine. No canal. Moderate RIGHT T1-2 neural foraminal narrowing. MRI lumbar spine: 1. Motion degraded examination. 2. Stable grade 1 L4-5 and L5-S1 anterolisthesis. Status post L5 laminectomies. 3. 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 4. Stable neural foraminal narrowing L2-3, L4-5 and L5-S1: Moderate on the RIGHT at L2-3. Electronically Signed   By: Elon Alas M.D.   On: 02/21/2018 23:24   Mr Thoracic Spine Wo Contrast  Result Date: 02/21/2018 CLINICAL DATA:  Nausea, vomiting and weakness. Recent treated urinary tract infection. History of atrial fibrillation, hyperlipidemia, spinal stenosis, spine surgery. EXAM: MRI CERVICAL, THORACIC AND LUMBAR SPINE WITHOUT CONTRAST MRI HEAD WITHOUT CONTRAST TECHNIQUE: Multiplanar and multiecho pulse sequences of the cervical  spine, to include the craniocervical junction and cervicothoracic junction, and thoracic and lumbar spine, were obtained without intravenous contrast. Multiplanar, multiecho pulse sequences of the brain and surrounding structures were obtained without intravenous contrast. COMPARISON:  None. MRI head November 21, 2017 and MRI thoracic spine November 12, 2016 and MRI lumbar spine April 21, 2016. FINDINGS: MRI HEAD FINDINGS INTRACRANIAL CONTENTS: No reduced diffusion to suggest acute ischemia or typical infection. No susceptibility artifact to suggest hemorrhage. The ventricles and sulci are normal for patient's age. Patchy to confluent supratentorial white matter FLAIR T2 hyperintensities. No suspicious parenchymal signal, masses, mass effect. No abnormal extra-axial fluid collections. No extra-axial masses. VASCULAR: Normal major intracranial vascular flow voids present at skull base. SKULL AND UPPER CERVICAL SPINE: No abnormal sellar expansion. No suspicious calvarial bone marrow signal. Craniocervical junction maintained. SINUSES/ORBITS: The mastoid air-cells and included paranasal sinuses are well-aerated.The included ocular globes and orbital contents are non-suspicious. OTHER: None. MRI CERVICAL SPINE FINDINGS-moderately motion degraded sagittal sequences, axial sequences are moderate to severely motion degraded. ALIGNMENT: Straightened cervical lordosis. Grade 1 C3-4 anterolisthesis and C7-T1 anterolisthesis. VERTEBRAE/DISCS: Vertebral bodies are intact. Moderate C3-4 disc height loss with RIGHT T1, bright T2 signal within the disc, no STIR signal abnormality contiguous with LEFT facet, most compatible mineralization. Severe C5-6, C6-7 and moderate C7-T1 disc height loss, and disc desiccation compatible with degenerative discs. Moderate C5-6 and C6-7 subacute discogenic endplate changes. CORD: Linear bright STIR signal at C3-4 not localized on remaining sequences, potentially due to motion. POSTERIOR FOSSA,  VERTEBRAL ARTERIES, PARASPINAL TISSUES: No MR findings of ligamentous injury. Vertebral artery flow voids present. Included posterior fossa and paraspinal soft tissues are normal. DISC LEVELS: Limited assessment due to motion. C2-3: Annular bulging, uncovertebral hypertrophy and severe RIGHT, moderate LEFT facet arthropathy. No canal stenosis or neural foraminal narrowing.  C3-4: Anterolisthesis. Small broad-based disc bulge, uncovertebral hypertrophy in severe LEFT, mild RIGHT facet arthropathy. No canal stenosis. Moderate RIGHT and severe suspected LEFT neural foraminal narrowing. C4-5: Small broad-based disc osteophyte and vertebral hypertrophy and severe LEFT and moderate RIGHT facet arthropathy. No canal stenosis. Moderate suspected LEFT neural foraminal narrowing. C5-6: Small broad-based disc bulge, uncovertebral hypertrophy and moderate LEFT, mild RIGHT facet arthropathy. No canal stenosis. Moderate suspected bilateral neural foraminal narrowing. C6-7: Small broad-based disc bulge, uncovertebral hypertrophy and mild facet arthropathy. No canal stenosis. Severe suspected neural narrowing. C7-T1: Anterolisthesis. Annular bulging and severe RIGHT moderate LEFT facet arthropathy. No canal. Mild moderate RIGHT neural foraminal narrowing. MRI THORACIC SPINE FINDINGS-moderately motion degraded examination. ALIGNMENT: Maintenance of the thoracic kyphosis. No malalignment. VERTEBRAE/DISCS: Vertebral bodies are intact. Advanced upper thoracic disc height loss with bridging bone marrow signal compatible with arthrodesis. Multilevel moderate to severe disc height loss with disc desiccation compatible with degenerative discs. T6 acute discogenic endplate changes versus Schmorl's node. No suspicious bone marrow signal. CORD: Limited assessment spinal cord due to motion, no syrinx or definite myelomalacia. PREVERTEBRAL AND PARASPINAL SOFT TISSUES:  Non suspicious. DISC LEVELS: Small T4-5 central disc protrusion. No canal  stenosis. Moderate RIGHT T1-2 neural foraminal narrowing. MRI LUMBAR SPINE FINDINGS-moderately motion degraded axial sequences. SEGMENTATION: For the purposes of this report, the last well-formed intervertebral disc is reported as L5-S1. Transitional anatomy with partially sacralized L5 vertebral body, consistent with prior MRI. ALIGNMENT: Maintained lumbar lordosis. Minimal stable grade 1 L4-5 anterolisthesis and grade 1 L5-S1 anterolisthesis. VERTEBRAE:Vertebral bodies are intact. Similar moderate L2-3 disc height loss, mild at L4-5. Diffuse disc desiccation multilevel mild chronic discogenic endplate changes. L4 inferior endplate acute Schmorl's node. Focal fat versus hemangioma RIGHT L5 pedicle. No suspicious bone marrow signal. CONUS MEDULLARIS AND CAUDA EQUINA: Conus medullaris terminates at L1 and demonstrates normal morphology and signal characteristics. Limited assessment of cauda equina due to motion. PARASPINAL AND OTHER SOFT TISSUES: Non suspicious. Severe paraspinal muscle atrophy and below the level surgical intervention. DISC LEVELS: L1-2: No disc bulge, canal stenosis nor neural foraminal narrowing. Mild facet arthropathy. L2-3: Small broad-based disc bulge. Moderate facet arthropathy and ligamentum flavum redundancy. No canal stenosis. Moderate RIGHT and mild LEFT neural foraminal narrowing. L3-4: Annular bulging. Severe facet arthropathy and ligamentum flavum redundancy. Mild canal stenosis. No neural foraminal narrowing. L4-5: Anterolisthesis. Annular bulging. Moderate to severe facet arthropathy with trace facet effusions. 9 mm RIGHT facet synovial cyst within paraspinal soft tissues. Moderate canal stenosis. Mild bilateral neural foraminal narrowing. L5-S1: Posterior decompression. Anterolisthesis. Severe facet arthropathy without canal stenosis. Mild-to-moderate bilateral neural foraminal narrowing. IMPRESSION: MRI head: 1. No acute intracranial process. 2. Stable moderate chronic small vessel  ischemic changes. MRI cervical spine: 1. Motion degraded examination. 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 2. Grade 1 C3-4 and C7-T1 anterolisthesis. No acute osseous process. 3. No canal stenosis. Neural foraminal narrowing C3-4 through C7-T1: Likely severe at C3-4 and C6-7. Given degree of motion, CT cervical spine may be of added value. MRI thoracic spine: 1. Motion degraded examination.  No fracture or malalignment. 2. Stable degenerative change of the thoracic spine. No canal. Moderate RIGHT T1-2 neural foraminal narrowing. MRI lumbar spine: 1. Motion degraded examination. 2. Stable grade 1 L4-5 and L5-S1 anterolisthesis. Status post L5 laminectomies. 3. 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 4. Stable neural foraminal narrowing L2-3, L4-5 and L5-S1: Moderate on the RIGHT at L2-3. Electronically Signed   By: Elon Alas M.D.   On: 02/21/2018 23:24  Mr Lumbar Spine Wo Contrast  Result Date: 02/21/2018 CLINICAL DATA:  Nausea, vomiting and weakness. Recent treated urinary tract infection. History of atrial fibrillation, hyperlipidemia, spinal stenosis, spine surgery. EXAM: MRI CERVICAL, THORACIC AND LUMBAR SPINE WITHOUT CONTRAST MRI HEAD WITHOUT CONTRAST TECHNIQUE: Multiplanar and multiecho pulse sequences of the cervical spine, to include the craniocervical junction and cervicothoracic junction, and thoracic and lumbar spine, were obtained without intravenous contrast. Multiplanar, multiecho pulse sequences of the brain and surrounding structures were obtained without intravenous contrast. COMPARISON:  None. MRI head November 21, 2017 and MRI thoracic spine November 12, 2016 and MRI lumbar spine April 21, 2016. FINDINGS: MRI HEAD FINDINGS INTRACRANIAL CONTENTS: No reduced diffusion to suggest acute ischemia or typical infection. No susceptibility artifact to suggest hemorrhage. The ventricles and sulci are normal for patient's age. Patchy to confluent supratentorial white matter  FLAIR T2 hyperintensities. No suspicious parenchymal signal, masses, mass effect. No abnormal extra-axial fluid collections. No extra-axial masses. VASCULAR: Normal major intracranial vascular flow voids present at skull base. SKULL AND UPPER CERVICAL SPINE: No abnormal sellar expansion. No suspicious calvarial bone marrow signal. Craniocervical junction maintained. SINUSES/ORBITS: The mastoid air-cells and included paranasal sinuses are well-aerated.The included ocular globes and orbital contents are non-suspicious. OTHER: None. MRI CERVICAL SPINE FINDINGS-moderately motion degraded sagittal sequences, axial sequences are moderate to severely motion degraded. ALIGNMENT: Straightened cervical lordosis. Grade 1 C3-4 anterolisthesis and C7-T1 anterolisthesis. VERTEBRAE/DISCS: Vertebral bodies are intact. Moderate C3-4 disc height loss with RIGHT T1, bright T2 signal within the disc, no STIR signal abnormality contiguous with LEFT facet, most compatible mineralization. Severe C5-6, C6-7 and moderate C7-T1 disc height loss, and disc desiccation compatible with degenerative discs. Moderate C5-6 and C6-7 subacute discogenic endplate changes. CORD: Linear bright STIR signal at C3-4 not localized on remaining sequences, potentially due to motion. POSTERIOR FOSSA, VERTEBRAL ARTERIES, PARASPINAL TISSUES: No MR findings of ligamentous injury. Vertebral artery flow voids present. Included posterior fossa and paraspinal soft tissues are normal. DISC LEVELS: Limited assessment due to motion. C2-3: Annular bulging, uncovertebral hypertrophy and severe RIGHT, moderate LEFT facet arthropathy. No canal stenosis or neural foraminal narrowing. C3-4: Anterolisthesis. Small broad-based disc bulge, uncovertebral hypertrophy in severe LEFT, mild RIGHT facet arthropathy. No canal stenosis. Moderate RIGHT and severe suspected LEFT neural foraminal narrowing. C4-5: Small broad-based disc osteophyte and vertebral hypertrophy and severe LEFT  and moderate RIGHT facet arthropathy. No canal stenosis. Moderate suspected LEFT neural foraminal narrowing. C5-6: Small broad-based disc bulge, uncovertebral hypertrophy and moderate LEFT, mild RIGHT facet arthropathy. No canal stenosis. Moderate suspected bilateral neural foraminal narrowing. C6-7: Small broad-based disc bulge, uncovertebral hypertrophy and mild facet arthropathy. No canal stenosis. Severe suspected neural narrowing. C7-T1: Anterolisthesis. Annular bulging and severe RIGHT moderate LEFT facet arthropathy. No canal. Mild moderate RIGHT neural foraminal narrowing. MRI THORACIC SPINE FINDINGS-moderately motion degraded examination. ALIGNMENT: Maintenance of the thoracic kyphosis. No malalignment. VERTEBRAE/DISCS: Vertebral bodies are intact. Advanced upper thoracic disc height loss with bridging bone marrow signal compatible with arthrodesis. Multilevel moderate to severe disc height loss with disc desiccation compatible with degenerative discs. T6 acute discogenic endplate changes versus Schmorl's node. No suspicious bone marrow signal. CORD: Limited assessment spinal cord due to motion, no syrinx or definite myelomalacia. PREVERTEBRAL AND PARASPINAL SOFT TISSUES:  Non suspicious. DISC LEVELS: Small T4-5 central disc protrusion. No canal stenosis. Moderate RIGHT T1-2 neural foraminal narrowing. MRI LUMBAR SPINE FINDINGS-moderately motion degraded axial sequences. SEGMENTATION: For the purposes of this report, the last well-formed intervertebral disc is reported as L5-S1. Transitional anatomy with  partially sacralized L5 vertebral body, consistent with prior MRI. ALIGNMENT: Maintained lumbar lordosis. Minimal stable grade 1 L4-5 anterolisthesis and grade 1 L5-S1 anterolisthesis. VERTEBRAE:Vertebral bodies are intact. Similar moderate L2-3 disc height loss, mild at L4-5. Diffuse disc desiccation multilevel mild chronic discogenic endplate changes. L4 inferior endplate acute Schmorl's node. Focal fat  versus hemangioma RIGHT L5 pedicle. No suspicious bone marrow signal. CONUS MEDULLARIS AND CAUDA EQUINA: Conus medullaris terminates at L1 and demonstrates normal morphology and signal characteristics. Limited assessment of cauda equina due to motion. PARASPINAL AND OTHER SOFT TISSUES: Non suspicious. Severe paraspinal muscle atrophy and below the level surgical intervention. DISC LEVELS: L1-2: No disc bulge, canal stenosis nor neural foraminal narrowing. Mild facet arthropathy. L2-3: Small broad-based disc bulge. Moderate facet arthropathy and ligamentum flavum redundancy. No canal stenosis. Moderate RIGHT and mild LEFT neural foraminal narrowing. L3-4: Annular bulging. Severe facet arthropathy and ligamentum flavum redundancy. Mild canal stenosis. No neural foraminal narrowing. L4-5: Anterolisthesis. Annular bulging. Moderate to severe facet arthropathy with trace facet effusions. 9 mm RIGHT facet synovial cyst within paraspinal soft tissues. Moderate canal stenosis. Mild bilateral neural foraminal narrowing. L5-S1: Posterior decompression. Anterolisthesis. Severe facet arthropathy without canal stenosis. Mild-to-moderate bilateral neural foraminal narrowing. IMPRESSION: MRI head: 1. No acute intracranial process. 2. Stable moderate chronic small vessel ischemic changes. MRI cervical spine: 1. Motion degraded examination. 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 2. Grade 1 C3-4 and C7-T1 anterolisthesis. No acute osseous process. 3. No canal stenosis. Neural foraminal narrowing C3-4 through C7-T1: Likely severe at C3-4 and C6-7. Given degree of motion, CT cervical spine may be of added value. MRI thoracic spine: 1. Motion degraded examination.  No fracture or malalignment. 2. Stable degenerative change of the thoracic spine. No canal. Moderate RIGHT T1-2 neural foraminal narrowing. MRI lumbar spine: 1. Motion degraded examination. 2. Stable grade 1 L4-5 and L5-S1 anterolisthesis. Status post L5 laminectomies.  3. 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 4. Stable neural foraminal narrowing L2-3, L4-5 and L5-S1: Moderate on the RIGHT at L2-3. Electronically Signed   By: Elon Alas M.D.   On: 02/21/2018 23:24   Dg Chest Portable 1 View  Result Date: 02/21/2018 CLINICAL DATA:  Nausea, vomiting and depression after being treated for urinary tract infection with Cipro. EXAM: PORTABLE CHEST 1 VIEW COMPARISON:  10/23/2017 FINDINGS: The heart size and mediastinal contours are within normal limits. Both lungs are clear. Stable mild elevation or eventration of the right hemidiaphragm. 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 IMPRESSION: No active disease. Electronically Signed   By: Ashley Royalty M.D.   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 -PT/OT    Arnaldo Natal, MD

## 2018-02-22 NOTE — Care Management Note (Signed)
Case Management Note  Patient Details  Name: Mark [name]. MRN: [id number] Date of Birth: Jan 16, 1941  Subjective/Objective:                  RNCM met with patient and his wife regarding [facility] letter and home health. Neuro was present and said that patient is safe to follow up as outpatient with them.  He has a rollator that he uses as needed.  He did not have preference of home health agency and agrees to [facility]. PCP is Dr. Ginette Pitman with [facility].  He uses [facility] for medications. Patient to discharge to home today.  Action/Plan:   Referral to [facility] with [facility]. [facility] delivered to patient.   Expected Discharge Date:                  Expected Discharge Plan:     In-House Referral:     Discharge planning Services  CM Consult  Post Acute Care Choice:  Home Health Choice offered to:  Patient, Spouse  DME Arranged:    DME Agency:     HH Arranged:  PT [place]:  [place]  Status of Service:  Completed, signed off  If discussed at [place] of Stay Meetings, dates discussed:    Additional Comments:  Marshell Garfinkel, RN 02/22/2018, 10:52 AM

## 2018-02-22 NOTE — Discharge Summary (Addendum)
[facility] NAME: Mark [name]    MR#:  [id number]  DATE OF BIRTH:  1940/11/26  DATE OF ADMISSION:  02/21/2018 ADMITTING PHYSICIAN: Harrie Foreman, MD  DATE OF DISCHARGE: 02/22/2018 12:48 PM  PRIMARY CARE PHYSICIAN: Tracie Harrier, MD   ADMISSION DIAGNOSIS:  Gait difficulty [R26.9] Candida rash of groin [B37.89] Generalized weakness [R53.1]  DISCHARGE DIAGNOSIS:  Gait instability Spinal stenosis chronic GERD Neuropathy  SECONDARY DIAGNOSIS:   Past Medical History:  Diagnosis Date  . Anxiety   . Atrial fibrillation ([place])   . Back pain, chronic   . Colon polyp   . Cyst of left kidney   . GERD (gastroesophageal reflux disease)   . Heart murmur   . Hyperlipemia   . Mitral valve prolapse   . Neuromuscular disorder ([place])   . Neuropathy   . Osteoarthritis   . Pulmonary emboli ([place])   . Spinal stenosis   . Thyroid nodule   . Vertigo      ADMITTING HISTORY Patient with past medical history of chronic back pain due to spinal stenosis, atrial fibrillation and hyperlipidemia resents to the emergency department due to weakness.  The patient states that his back pain has resulted in an inability to stand without feeling as if he may fall over.  The patient does not have any current neurologic episodes his degree of pain and unsteadiness indicated that it may not be safe to discharge him home in the emergency department staff asked the hospitalist service to admit for neurosurgical evaluation.   HOSPITAL COURSE:  Patient has history of chronic back pain from spinal stenosis and chronic atrial fibrillation on eliquis.  He was worked up for weakness with MRI brain, MRI cervical spine as well as thoracic spine.  No acute abnormalities noted.  Patient received physical therapy.  Neurology evaluated the patient and advised patient to follow-up as outpatient with no acute intervention recommended.  Patient will be discharged home  with home physical therapy services.  CONSULTS OBTAINED:  Treatment Team:  Catarina Hartshorn, MD  DRUG ALLERGIES:   Allergies  Allergen Reactions  . Erythromycin Swelling  . Keflex [Cephalexin] Swelling  . Penicillins Swelling    Has patient had a PCN reaction causing immediate rash, facial/tongue/throat swelling, SOB or lightheadedness with hypotension: Yes Has patient had a PCN reaction causing severe rash involving mucus membranes or skin necrosis: No Has patient had a PCN reaction that required hospitalization: Unknown Has patient had a PCN reaction occurring within the last 10 years: No If all of the above answers are "NO", then may proceed with Cephalosporin use.     DISCHARGE MEDICATIONS:   Allergies as of 02/22/2018      Reactions   Erythromycin Swelling   Keflex [cephalexin] Swelling   Penicillins Swelling   Has patient had a PCN reaction causing immediate rash, facial/tongue/throat swelling, SOB or lightheadedness with hypotension: Yes Has patient had a PCN reaction causing severe rash involving mucus membranes or skin necrosis: No Has patient had a PCN reaction that required hospitalization: Unknown Has patient had a PCN reaction occurring within the last 10 years: No If all of the above answers are "NO", then may proceed with Cephalosporin use.      Medication List    TAKE these medications   B-12 5000 MCG Caps Take 5,000 mg by mouth daily. What changed:    medication strength  when to take this   buPROPion 75 MG tablet Commonly known  as:  WELLBUTRIN Take 150 mg by mouth 2 (two) times daily.   CO Q 10 PO Take 1 Dose by mouth daily.   ELIQUIS 2.5 MG Tabs tablet Generic drug:  apixaban Take 2.5 mg by mouth 2 (two) times daily.   esomeprazole 40 MG capsule Commonly known as:  NEXIUM Take 40 mg by mouth 3 (three) times a week.   finasteride 5 MG tablet Commonly known as:  PROSCAR Take 5 mg by mouth daily.   FLUoxetine 40 MG capsule Commonly  known as:  PROZAC Take 40 mg by mouth daily. Total 60mg  daily   PROZAC 20 MG capsule Generic drug:  FLUoxetine Take 20 mg by mouth daily.   ondansetron 4 MG disintegrating tablet Commonly known as:  ZOFRAN-ODT Take 4 mg by mouth every 8 (eight) hours as needed.   simvastatin 40 MG tablet Commonly known as:  ZOCOR Take 40 mg by mouth at bedtime.   tamsulosin 0.4 MG Caps capsule Commonly known as:  FLOMAX Take 0.4 mg by mouth daily.       Today  Patient seen and evaluated today Tolerated physical therapy well No dizziness No chest pain  VITAL SIGNS:  Blood pressure 116/73, pulse 78, temperature 98 F (36.7 C), temperature source Oral, resp. rate 18, height 6\' 3"  (1.905 m), weight 98.8 kg, SpO2 97 %.  I/O:    Intake/Output Summary (Last 24 hours) at 02/22/2018 1305 Last data filed at 02/22/2018 0900 Gross per 24 hour  Intake 240 ml  Output 300 ml  Net -60 ml    PHYSICAL EXAMINATION:  Physical Exam  GENERAL:  77 y.o.-year-old patient lying in the bed with no acute distress.  LUNGS: Normal breath sounds bilaterally, no wheezing, rales,rhonchi or crepitation. No use of accessory muscles of respiration.  CARDIOVASCULAR: S1, S2 normal. No murmurs, rubs, or gallops.  ABDOMEN: Soft, non-tender, non-distended. Bowel sounds present. No organomegaly or mass.  NEUROLOGIC: Moves all 4 extremities. PSYCHIATRIC: The patient is alert and oriented x 3.  SKIN: No obvious rash, lesion, or ulcer.   DATA REVIEW:   CBC Recent Labs  Lab 02/21/18 1625  WBC 7.8  HGB 15.8  HCT 47.8  PLT 255    Chemistries  Recent Labs  Lab 02/21/18 1625  NA 138  K 4.0  CL 104  CO2 26  GLUCOSE 110*  BUN 14  CREATININE 1.03  CALCIUM 9.2  AST 31  ALT 37  ALKPHOS 57  BILITOT 0.9    Cardiac Enzymes Recent Labs  Lab 02/21/18 1625  TROPONINI <0.03    Microbiology Results  Results for orders placed or performed in visit on 02/03/18  Microscopic Examination     Status: Abnormal    Collection Time: 02/03/18  9:47 AM  Result Value Ref Range Status   WBC, UA 0-5 0 - 5 /hpf Final   RBC, UA None seen 0 - 2 /hpf Final   Epithelial Cells (non renal) None seen 0 - 10 /hpf Final   Mucus, UA Present (A) Not Estab. Final   Bacteria, UA Moderate (A) None seen/Few Final  CULTURE, URINE COMPREHENSIVE     Status: None   Collection Time: 02/03/18 10:33 AM  Result Value Ref Range Status   Urine Culture, Comprehensive Final report  Final   Organism ID, Bacteria Comment  Final    Comment: No growth in 36 - 48 hours.    RADIOLOGY:  Mr Brain Wo Contrast  Result Date: 02/21/2018 CLINICAL DATA:  Nausea, vomiting and weakness.  Recent treated urinary tract infection. History of atrial fibrillation, hyperlipidemia, spinal stenosis, spine surgery. EXAM: MRI CERVICAL, THORACIC AND LUMBAR SPINE WITHOUT CONTRAST MRI HEAD WITHOUT CONTRAST TECHNIQUE: Multiplanar and multiecho pulse sequences of the cervical spine, to include the craniocervical junction and cervicothoracic junction, and thoracic and lumbar spine, were obtained without intravenous contrast. Multiplanar, multiecho pulse sequences of the brain and surrounding structures were obtained without intravenous contrast. COMPARISON:  None. MRI head November 21, 2017 and MRI thoracic spine November 12, 2016 and MRI lumbar spine April 21, 2016. FINDINGS: MRI HEAD FINDINGS INTRACRANIAL CONTENTS: No reduced diffusion to suggest acute ischemia or typical infection. No susceptibility artifact to suggest hemorrhage. The ventricles and sulci are normal for patient's age. Patchy to confluent supratentorial white matter FLAIR T2 hyperintensities. No suspicious parenchymal signal, masses, mass effect. No abnormal extra-axial fluid collections. No extra-axial masses. VASCULAR: Normal major intracranial vascular flow voids present at skull base. SKULL AND UPPER CERVICAL SPINE: No abnormal sellar expansion. No suspicious calvarial bone marrow signal.  Craniocervical junction maintained. SINUSES/ORBITS: The mastoid air-cells and included paranasal sinuses are well-aerated.The included ocular globes and orbital contents are non-suspicious. OTHER: None. MRI CERVICAL SPINE FINDINGS-moderately motion degraded sagittal sequences, axial sequences are moderate to severely motion degraded. ALIGNMENT: Straightened cervical lordosis. Grade 1 C3-4 anterolisthesis and C7-T1 anterolisthesis. VERTEBRAE/DISCS: Vertebral bodies are intact. Moderate C3-4 disc height loss with RIGHT T1, bright T2 signal within the disc, no STIR signal abnormality contiguous with LEFT facet, most compatible mineralization. Severe C5-6, C6-7 and moderate C7-T1 disc height loss, and disc desiccation compatible with degenerative discs. Moderate C5-6 and C6-7 subacute discogenic endplate changes. CORD: Linear bright STIR signal at C3-4 not localized on remaining sequences, potentially due to motion. POSTERIOR FOSSA, VERTEBRAL ARTERIES, PARASPINAL TISSUES: No MR findings of ligamentous injury. Vertebral artery flow voids present. Included posterior fossa and paraspinal soft tissues are normal. DISC LEVELS: Limited assessment due to motion. C2-3: Annular bulging, uncovertebral hypertrophy and severe RIGHT, moderate LEFT facet arthropathy. No canal stenosis or neural foraminal narrowing. C3-4: Anterolisthesis. Small broad-based disc bulge, uncovertebral hypertrophy in severe LEFT, mild RIGHT facet arthropathy. No canal stenosis. Moderate RIGHT and severe suspected LEFT neural foraminal narrowing. C4-5: Small broad-based disc osteophyte and vertebral hypertrophy and severe LEFT and moderate RIGHT facet arthropathy. No canal stenosis. Moderate suspected LEFT neural foraminal narrowing. C5-6: Small broad-based disc bulge, uncovertebral hypertrophy and moderate LEFT, mild RIGHT facet arthropathy. No canal stenosis. Moderate suspected bilateral neural foraminal narrowing. C6-7: Small broad-based disc bulge,  uncovertebral hypertrophy and mild facet arthropathy. No canal stenosis. Severe suspected neural narrowing. C7-T1: Anterolisthesis. Annular bulging and severe RIGHT moderate LEFT facet arthropathy. No canal. Mild moderate RIGHT neural foraminal narrowing. MRI THORACIC SPINE FINDINGS-moderately motion degraded examination. ALIGNMENT: Maintenance of the thoracic kyphosis. No malalignment. VERTEBRAE/DISCS: Vertebral bodies are intact. Advanced upper thoracic disc height loss with bridging bone marrow signal compatible with arthrodesis. Multilevel moderate to severe disc height loss with disc desiccation compatible with degenerative discs. T6 acute discogenic endplate changes versus Schmorl's node. No suspicious bone marrow signal. CORD: Limited assessment spinal cord due to motion, no syrinx or definite myelomalacia. PREVERTEBRAL AND PARASPINAL SOFT TISSUES:  Non suspicious. DISC LEVELS: Small T4-5 central disc protrusion. No canal stenosis. Moderate RIGHT T1-2 neural foraminal narrowing. MRI LUMBAR SPINE FINDINGS-moderately motion degraded axial sequences. SEGMENTATION: For the purposes of this report, the last well-formed intervertebral disc is reported as L5-S1. Transitional anatomy with partially sacralized L5 vertebral body, consistent with prior MRI. ALIGNMENT: Maintained lumbar lordosis. Minimal stable grade  1 L4-5 anterolisthesis and grade 1 L5-S1 anterolisthesis. VERTEBRAE:Vertebral bodies are intact. Similar moderate L2-3 disc height loss, mild at L4-5. Diffuse disc desiccation multilevel mild chronic discogenic endplate changes. L4 inferior endplate acute Schmorl's node. Focal fat versus hemangioma RIGHT L5 pedicle. No suspicious bone marrow signal. CONUS MEDULLARIS AND CAUDA EQUINA: Conus medullaris terminates at L1 and demonstrates normal morphology and signal characteristics. Limited assessment of cauda equina due to motion. PARASPINAL AND OTHER SOFT TISSUES: Non suspicious. Severe paraspinal muscle  atrophy and below the level surgical intervention. DISC LEVELS: L1-2: No disc bulge, canal stenosis nor neural foraminal narrowing. Mild facet arthropathy. L2-3: Small broad-based disc bulge. Moderate facet arthropathy and ligamentum flavum redundancy. No canal stenosis. Moderate RIGHT and mild LEFT neural foraminal narrowing. L3-4: Annular bulging. Severe facet arthropathy and ligamentum flavum redundancy. Mild canal stenosis. No neural foraminal narrowing. L4-5: Anterolisthesis. Annular bulging. Moderate to severe facet arthropathy with trace facet effusions. 9 mm RIGHT facet synovial cyst within paraspinal soft tissues. Moderate canal stenosis. Mild bilateral neural foraminal narrowing. L5-S1: Posterior decompression. Anterolisthesis. Severe facet arthropathy without canal stenosis. Mild-to-moderate bilateral neural foraminal narrowing. IMPRESSION: MRI head: 1. No acute intracranial process. 2. Stable moderate chronic small vessel ischemic changes. MRI cervical spine: 1. Motion degraded examination. 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 2. Grade 1 C3-4 and C7-T1 anterolisthesis. No acute osseous process. 3. No canal stenosis. Neural foraminal narrowing C3-4 through C7-T1: Likely severe at C3-4 and C6-7. Given degree of motion, CT cervical spine may be of added value. MRI thoracic spine: 1. Motion degraded examination.  No fracture or malalignment. 2. Stable degenerative change of the thoracic spine. No canal. Moderate RIGHT T1-2 neural foraminal narrowing. MRI lumbar spine: 1. Motion degraded examination. 2. Stable grade 1 L4-5 and L5-S1 anterolisthesis. Status post L5 laminectomies. 3. 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 4. Stable neural foraminal narrowing L2-3, L4-5 and L5-S1: Moderate on the RIGHT at L2-3. Electronically Signed   By: Elon Alas M.D.   On: 02/21/2018 23:24   Mr Cervical Spine Wo Contrast  Result Date: 02/21/2018 CLINICAL DATA:  Nausea, vomiting and weakness.  Recent treated urinary tract infection. History of atrial fibrillation, hyperlipidemia, spinal stenosis, spine surgery. EXAM: MRI CERVICAL, THORACIC AND LUMBAR SPINE WITHOUT CONTRAST MRI HEAD WITHOUT CONTRAST TECHNIQUE: Multiplanar and multiecho pulse sequences of the cervical spine, to include the craniocervical junction and cervicothoracic junction, and thoracic and lumbar spine, were obtained without intravenous contrast. Multiplanar, multiecho pulse sequences of the brain and surrounding structures were obtained without intravenous contrast. COMPARISON:  None. MRI head November 21, 2017 and MRI thoracic spine November 12, 2016 and MRI lumbar spine April 21, 2016. FINDINGS: MRI HEAD FINDINGS INTRACRANIAL CONTENTS: No reduced diffusion to suggest acute ischemia or typical infection. No susceptibility artifact to suggest hemorrhage. The ventricles and sulci are normal for patient's age. Patchy to confluent supratentorial white matter FLAIR T2 hyperintensities. No suspicious parenchymal signal, masses, mass effect. No abnormal extra-axial fluid collections. No extra-axial masses. VASCULAR: Normal major intracranial vascular flow voids present at skull base. SKULL AND UPPER CERVICAL SPINE: No abnormal sellar expansion. No suspicious calvarial bone marrow signal. Craniocervical junction maintained. SINUSES/ORBITS: The mastoid air-cells and included paranasal sinuses are well-aerated.The included ocular globes and orbital contents are non-suspicious. OTHER: None. MRI CERVICAL SPINE FINDINGS-moderately motion degraded sagittal sequences, axial sequences are moderate to severely motion degraded. ALIGNMENT: Straightened cervical lordosis. Grade 1 C3-4 anterolisthesis and C7-T1 anterolisthesis. VERTEBRAE/DISCS: Vertebral bodies are intact. Moderate C3-4 disc height loss with RIGHT T1,  bright T2 signal within the disc, no STIR signal abnormality contiguous with LEFT facet, most compatible mineralization. Severe C5-6, C6-7  and moderate C7-T1 disc height loss, and disc desiccation compatible with degenerative discs. Moderate C5-6 and C6-7 subacute discogenic endplate changes. CORD: Linear bright STIR signal at C3-4 not localized on remaining sequences, potentially due to motion. POSTERIOR FOSSA, VERTEBRAL ARTERIES, PARASPINAL TISSUES: No MR findings of ligamentous injury. Vertebral artery flow voids present. Included posterior fossa and paraspinal soft tissues are normal. DISC LEVELS: Limited assessment due to motion. C2-3: Annular bulging, uncovertebral hypertrophy and severe RIGHT, moderate LEFT facet arthropathy. No canal stenosis or neural foraminal narrowing. C3-4: Anterolisthesis. Small broad-based disc bulge, uncovertebral hypertrophy in severe LEFT, mild RIGHT facet arthropathy. No canal stenosis. Moderate RIGHT and severe suspected LEFT neural foraminal narrowing. C4-5: Small broad-based disc osteophyte and vertebral hypertrophy and severe LEFT and moderate RIGHT facet arthropathy. No canal stenosis. Moderate suspected LEFT neural foraminal narrowing. C5-6: Small broad-based disc bulge, uncovertebral hypertrophy and moderate LEFT, mild RIGHT facet arthropathy. No canal stenosis. Moderate suspected bilateral neural foraminal narrowing. C6-7: Small broad-based disc bulge, uncovertebral hypertrophy and mild facet arthropathy. No canal stenosis. Severe suspected neural narrowing. C7-T1: Anterolisthesis. Annular bulging and severe RIGHT moderate LEFT facet arthropathy. No canal. Mild moderate RIGHT neural foraminal narrowing. MRI THORACIC SPINE FINDINGS-moderately motion degraded examination. ALIGNMENT: Maintenance of the thoracic kyphosis. No malalignment. VERTEBRAE/DISCS: Vertebral bodies are intact. Advanced upper thoracic disc height loss with bridging bone marrow signal compatible with arthrodesis. Multilevel moderate to severe disc height loss with disc desiccation compatible with degenerative discs. T6 acute discogenic  endplate changes versus Schmorl's node. No suspicious bone marrow signal. CORD: Limited assessment spinal cord due to motion, no syrinx or definite myelomalacia. PREVERTEBRAL AND PARASPINAL SOFT TISSUES:  Non suspicious. DISC LEVELS: Small T4-5 central disc protrusion. No canal stenosis. Moderate RIGHT T1-2 neural foraminal narrowing. MRI LUMBAR SPINE FINDINGS-moderately motion degraded axial sequences. SEGMENTATION: For the purposes of this report, the last well-formed intervertebral disc is reported as L5-S1. Transitional anatomy with partially sacralized L5 vertebral body, consistent with prior MRI. ALIGNMENT: Maintained lumbar lordosis. Minimal stable grade 1 L4-5 anterolisthesis and grade 1 L5-S1 anterolisthesis. VERTEBRAE:Vertebral bodies are intact. Similar moderate L2-3 disc height loss, mild at L4-5. Diffuse disc desiccation multilevel mild chronic discogenic endplate changes. L4 inferior endplate acute Schmorl's node. Focal fat versus hemangioma RIGHT L5 pedicle. No suspicious bone marrow signal. CONUS MEDULLARIS AND CAUDA EQUINA: Conus medullaris terminates at L1 and demonstrates normal morphology and signal characteristics. Limited assessment of cauda equina due to motion. PARASPINAL AND OTHER SOFT TISSUES: Non suspicious. Severe paraspinal muscle atrophy and below the level surgical intervention. DISC LEVELS: L1-2: No disc bulge, canal stenosis nor neural foraminal narrowing. Mild facet arthropathy. L2-3: Small broad-based disc bulge. Moderate facet arthropathy and ligamentum flavum redundancy. No canal stenosis. Moderate RIGHT and mild LEFT neural foraminal narrowing. L3-4: Annular bulging. Severe facet arthropathy and ligamentum flavum redundancy. Mild canal stenosis. No neural foraminal narrowing. L4-5: Anterolisthesis. Annular bulging. Moderate to severe facet arthropathy with trace facet effusions. 9 mm RIGHT facet synovial cyst within paraspinal soft tissues. Moderate canal stenosis. Mild  bilateral neural foraminal narrowing. L5-S1: Posterior decompression. Anterolisthesis. Severe facet arthropathy without canal stenosis. Mild-to-moderate bilateral neural foraminal narrowing. IMPRESSION: MRI head: 1. No acute intracranial process. 2. Stable moderate chronic small vessel ischemic changes. MRI cervical spine: 1. Motion degraded examination. 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 2. Grade 1 C3-4 and C7-T1 anterolisthesis. No acute osseous process. 3. No canal stenosis.  Neural foraminal narrowing C3-4 through C7-T1: Likely severe at C3-4 and C6-7. Given degree of motion, CT cervical spine may be of added value. MRI thoracic spine: 1. Motion degraded examination.  No fracture or malalignment. 2. Stable degenerative change of the thoracic spine. No canal. Moderate RIGHT T1-2 neural foraminal narrowing. MRI lumbar spine: 1. Motion degraded examination. 2. Stable grade 1 L4-5 and L5-S1 anterolisthesis. Status post L5 laminectomies. 3. 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 4. Stable neural foraminal narrowing L2-3, L4-5 and L5-S1: Moderate on the RIGHT at L2-3. Electronically Signed   By: Elon Alas M.D.   On: 02/21/2018 23:24   Mr Thoracic Spine Wo Contrast  Result Date: 02/21/2018 CLINICAL DATA:  Nausea, vomiting and weakness. Recent treated urinary tract infection. History of atrial fibrillation, hyperlipidemia, spinal stenosis, spine surgery. EXAM: MRI CERVICAL, THORACIC AND LUMBAR SPINE WITHOUT CONTRAST MRI HEAD WITHOUT CONTRAST TECHNIQUE: Multiplanar and multiecho pulse sequences of the cervical spine, to include the craniocervical junction and cervicothoracic junction, and thoracic and lumbar spine, were obtained without intravenous contrast. Multiplanar, multiecho pulse sequences of the brain and surrounding structures were obtained without intravenous contrast. COMPARISON:  None. MRI head November 21, 2017 and MRI thoracic spine November 12, 2016 and MRI lumbar spine  April 21, 2016. FINDINGS: MRI HEAD FINDINGS INTRACRANIAL CONTENTS: No reduced diffusion to suggest acute ischemia or typical infection. No susceptibility artifact to suggest hemorrhage. The ventricles and sulci are normal for patient's age. Patchy to confluent supratentorial white matter FLAIR T2 hyperintensities. No suspicious parenchymal signal, masses, mass effect. No abnormal extra-axial fluid collections. No extra-axial masses. VASCULAR: Normal major intracranial vascular flow voids present at skull base. SKULL AND UPPER CERVICAL SPINE: No abnormal sellar expansion. No suspicious calvarial bone marrow signal. Craniocervical junction maintained. SINUSES/ORBITS: The mastoid air-cells and included paranasal sinuses are well-aerated.The included ocular globes and orbital contents are non-suspicious. OTHER: None. MRI CERVICAL SPINE FINDINGS-moderately motion degraded sagittal sequences, axial sequences are moderate to severely motion degraded. ALIGNMENT: Straightened cervical lordosis. Grade 1 C3-4 anterolisthesis and C7-T1 anterolisthesis. VERTEBRAE/DISCS: Vertebral bodies are intact. Moderate C3-4 disc height loss with RIGHT T1, bright T2 signal within the disc, no STIR signal abnormality contiguous with LEFT facet, most compatible mineralization. Severe C5-6, C6-7 and moderate C7-T1 disc height loss, and disc desiccation compatible with degenerative discs. Moderate C5-6 and C6-7 subacute discogenic endplate changes. CORD: Linear bright STIR signal at C3-4 not localized on remaining sequences, potentially due to motion. POSTERIOR FOSSA, VERTEBRAL ARTERIES, PARASPINAL TISSUES: No MR findings of ligamentous injury. Vertebral artery flow voids present. Included posterior fossa and paraspinal soft tissues are normal. DISC LEVELS: Limited assessment due to motion. C2-3: Annular bulging, uncovertebral hypertrophy and severe RIGHT, moderate LEFT facet arthropathy. No canal stenosis or neural foraminal narrowing. C3-4:  Anterolisthesis. Small broad-based disc bulge, uncovertebral hypertrophy in severe LEFT, mild RIGHT facet arthropathy. No canal stenosis. Moderate RIGHT and severe suspected LEFT neural foraminal narrowing. C4-5: Small broad-based disc osteophyte and vertebral hypertrophy and severe LEFT and moderate RIGHT facet arthropathy. No canal stenosis. Moderate suspected LEFT neural foraminal narrowing. C5-6: Small broad-based disc bulge, uncovertebral hypertrophy and moderate LEFT, mild RIGHT facet arthropathy. No canal stenosis. Moderate suspected bilateral neural foraminal narrowing. C6-7: Small broad-based disc bulge, uncovertebral hypertrophy and mild facet arthropathy. No canal stenosis. Severe suspected neural narrowing. C7-T1: Anterolisthesis. Annular bulging and severe RIGHT moderate LEFT facet arthropathy. No canal. Mild moderate RIGHT neural foraminal narrowing. MRI THORACIC SPINE FINDINGS-moderately motion degraded examination. ALIGNMENT: Maintenance of the thoracic kyphosis. No  malalignment. VERTEBRAE/DISCS: Vertebral bodies are intact. Advanced upper thoracic disc height loss with bridging bone marrow signal compatible with arthrodesis. Multilevel moderate to severe disc height loss with disc desiccation compatible with degenerative discs. T6 acute discogenic endplate changes versus Schmorl's node. No suspicious bone marrow signal. CORD: Limited assessment spinal cord due to motion, no syrinx or definite myelomalacia. PREVERTEBRAL AND PARASPINAL SOFT TISSUES:  Non suspicious. DISC LEVELS: Small T4-5 central disc protrusion. No canal stenosis. Moderate RIGHT T1-2 neural foraminal narrowing. MRI LUMBAR SPINE FINDINGS-moderately motion degraded axial sequences. SEGMENTATION: For the purposes of this report, the last well-formed intervertebral disc is reported as L5-S1. Transitional anatomy with partially sacralized L5 vertebral body, consistent with prior MRI. ALIGNMENT: Maintained lumbar lordosis. Minimal stable  grade 1 L4-5 anterolisthesis and grade 1 L5-S1 anterolisthesis. VERTEBRAE:Vertebral bodies are intact. Similar moderate L2-3 disc height loss, mild at L4-5. Diffuse disc desiccation multilevel mild chronic discogenic endplate changes. L4 inferior endplate acute Schmorl's node. Focal fat versus hemangioma RIGHT L5 pedicle. No suspicious bone marrow signal. CONUS MEDULLARIS AND CAUDA EQUINA: Conus medullaris terminates at L1 and demonstrates normal morphology and signal characteristics. Limited assessment of cauda equina due to motion. PARASPINAL AND OTHER SOFT TISSUES: Non suspicious. Severe paraspinal muscle atrophy and below the level surgical intervention. DISC LEVELS: L1-2: No disc bulge, canal stenosis nor neural foraminal narrowing. Mild facet arthropathy. L2-3: Small broad-based disc bulge. Moderate facet arthropathy and ligamentum flavum redundancy. No canal stenosis. Moderate RIGHT and mild LEFT neural foraminal narrowing. L3-4: Annular bulging. Severe facet arthropathy and ligamentum flavum redundancy. Mild canal stenosis. No neural foraminal narrowing. L4-5: Anterolisthesis. Annular bulging. Moderate to severe facet arthropathy with trace facet effusions. 9 mm RIGHT facet synovial cyst within paraspinal soft tissues. Moderate canal stenosis. Mild bilateral neural foraminal narrowing. L5-S1: Posterior decompression. Anterolisthesis. Severe facet arthropathy without canal stenosis. Mild-to-moderate bilateral neural foraminal narrowing. IMPRESSION: MRI head: 1. No acute intracranial process. 2. Stable moderate chronic small vessel ischemic changes. MRI cervical spine: 1. Motion degraded examination. 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 2. Grade 1 C3-4 and C7-T1 anterolisthesis. No acute osseous process. 3. No canal stenosis. Neural foraminal narrowing C3-4 through C7-T1: Likely severe at C3-4 and C6-7. Given degree of motion, CT cervical spine may be of added value. MRI thoracic spine: 1. Motion  degraded examination.  No fracture or malalignment. 2. Stable degenerative change of the thoracic spine. No canal. Moderate RIGHT T1-2 neural foraminal narrowing. MRI lumbar spine: 1. Motion degraded examination. 2. Stable grade 1 L4-5 and L5-S1 anterolisthesis. Status post L5 laminectomies. 3. 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 4. Stable neural foraminal narrowing L2-3, L4-5 and L5-S1: Moderate on the RIGHT at L2-3. Electronically Signed   By: Elon Alas M.D.   On: 02/21/2018 23:24   Mr Lumbar Spine Wo Contrast  Result Date: 02/21/2018 CLINICAL DATA:  Nausea, vomiting and weakness. Recent treated urinary tract infection. History of atrial fibrillation, hyperlipidemia, spinal stenosis, spine surgery. EXAM: MRI CERVICAL, THORACIC AND LUMBAR SPINE WITHOUT CONTRAST MRI HEAD WITHOUT CONTRAST TECHNIQUE: Multiplanar and multiecho pulse sequences of the cervical spine, to include the craniocervical junction and cervicothoracic junction, and thoracic and lumbar spine, were obtained without intravenous contrast. Multiplanar, multiecho pulse sequences of the brain and surrounding structures were obtained without intravenous contrast. COMPARISON:  None. MRI head November 21, 2017 and MRI thoracic spine November 12, 2016 and MRI lumbar spine April 21, 2016. FINDINGS: MRI HEAD FINDINGS INTRACRANIAL CONTENTS: No reduced diffusion to suggest acute ischemia or typical infection. No  susceptibility artifact to suggest hemorrhage. The ventricles and sulci are normal for patient's age. Patchy to confluent supratentorial white matter FLAIR T2 hyperintensities. No suspicious parenchymal signal, masses, mass effect. No abnormal extra-axial fluid collections. No extra-axial masses. VASCULAR: Normal major intracranial vascular flow voids present at skull base. SKULL AND UPPER CERVICAL SPINE: No abnormal sellar expansion. No suspicious calvarial bone marrow signal. Craniocervical junction maintained.  SINUSES/ORBITS: The mastoid air-cells and included paranasal sinuses are well-aerated.The included ocular globes and orbital contents are non-suspicious. OTHER: None. MRI CERVICAL SPINE FINDINGS-moderately motion degraded sagittal sequences, axial sequences are moderate to severely motion degraded. ALIGNMENT: Straightened cervical lordosis. Grade 1 C3-4 anterolisthesis and C7-T1 anterolisthesis. VERTEBRAE/DISCS: Vertebral bodies are intact. Moderate C3-4 disc height loss with RIGHT T1, bright T2 signal within the disc, no STIR signal abnormality contiguous with LEFT facet, most compatible mineralization. Severe C5-6, C6-7 and moderate C7-T1 disc height loss, and disc desiccation compatible with degenerative discs. Moderate C5-6 and C6-7 subacute discogenic endplate changes. CORD: Linear bright STIR signal at C3-4 not localized on remaining sequences, potentially due to motion. POSTERIOR FOSSA, VERTEBRAL ARTERIES, PARASPINAL TISSUES: No MR findings of ligamentous injury. Vertebral artery flow voids present. Included posterior fossa and paraspinal soft tissues are normal. DISC LEVELS: Limited assessment due to motion. C2-3: Annular bulging, uncovertebral hypertrophy and severe RIGHT, moderate LEFT facet arthropathy. No canal stenosis or neural foraminal narrowing. C3-4: Anterolisthesis. Small broad-based disc bulge, uncovertebral hypertrophy in severe LEFT, mild RIGHT facet arthropathy. No canal stenosis. Moderate RIGHT and severe suspected LEFT neural foraminal narrowing. C4-5: Small broad-based disc osteophyte and vertebral hypertrophy and severe LEFT and moderate RIGHT facet arthropathy. No canal stenosis. Moderate suspected LEFT neural foraminal narrowing. C5-6: Small broad-based disc bulge, uncovertebral hypertrophy and moderate LEFT, mild RIGHT facet arthropathy. No canal stenosis. Moderate suspected bilateral neural foraminal narrowing. C6-7: Small broad-based disc bulge, uncovertebral hypertrophy and mild  facet arthropathy. No canal stenosis. Severe suspected neural narrowing. C7-T1: Anterolisthesis. Annular bulging and severe RIGHT moderate LEFT facet arthropathy. No canal. Mild moderate RIGHT neural foraminal narrowing. MRI THORACIC SPINE FINDINGS-moderately motion degraded examination. ALIGNMENT: Maintenance of the thoracic kyphosis. No malalignment. VERTEBRAE/DISCS: Vertebral bodies are intact. Advanced upper thoracic disc height loss with bridging bone marrow signal compatible with arthrodesis. Multilevel moderate to severe disc height loss with disc desiccation compatible with degenerative discs. T6 acute discogenic endplate changes versus Schmorl's node. No suspicious bone marrow signal. CORD: Limited assessment spinal cord due to motion, no syrinx or definite myelomalacia. PREVERTEBRAL AND PARASPINAL SOFT TISSUES:  Non suspicious. DISC LEVELS: Small T4-5 central disc protrusion. No canal stenosis. Moderate RIGHT T1-2 neural foraminal narrowing. MRI LUMBAR SPINE FINDINGS-moderately motion degraded axial sequences. SEGMENTATION: For the purposes of this report, the last well-formed intervertebral disc is reported as L5-S1. Transitional anatomy with partially sacralized L5 vertebral body, consistent with prior MRI. ALIGNMENT: Maintained lumbar lordosis. Minimal stable grade 1 L4-5 anterolisthesis and grade 1 L5-S1 anterolisthesis. VERTEBRAE:Vertebral bodies are intact. Similar moderate L2-3 disc height loss, mild at L4-5. Diffuse disc desiccation multilevel mild chronic discogenic endplate changes. L4 inferior endplate acute Schmorl's node. Focal fat versus hemangioma RIGHT L5 pedicle. No suspicious bone marrow signal. CONUS MEDULLARIS AND CAUDA EQUINA: Conus medullaris terminates at L1 and demonstrates normal morphology and signal characteristics. Limited assessment of cauda equina due to motion. PARASPINAL AND OTHER SOFT TISSUES: Non suspicious. Severe paraspinal muscle atrophy and below the level surgical  intervention. DISC LEVELS: L1-2: No disc bulge, canal stenosis nor neural foraminal narrowing. Mild facet arthropathy. L2-3: Small broad-based  disc bulge. Moderate facet arthropathy and ligamentum flavum redundancy. No canal stenosis. Moderate RIGHT and mild LEFT neural foraminal narrowing. L3-4: Annular bulging. Severe facet arthropathy and ligamentum flavum redundancy. Mild canal stenosis. No neural foraminal narrowing. L4-5: Anterolisthesis. Annular bulging. Moderate to severe facet arthropathy with trace facet effusions. 9 mm RIGHT facet synovial cyst within paraspinal soft tissues. Moderate canal stenosis. Mild bilateral neural foraminal narrowing. L5-S1: Posterior decompression. Anterolisthesis. Severe facet arthropathy without canal stenosis. Mild-to-moderate bilateral neural foraminal narrowing. IMPRESSION: MRI head: 1. No acute intracranial process. 2. Stable moderate chronic small vessel ischemic changes. MRI cervical spine: 1. Motion degraded examination. 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 2. Grade 1 C3-4 and C7-T1 anterolisthesis. No acute osseous process. 3. No canal stenosis. Neural foraminal narrowing C3-4 through C7-T1: Likely severe at C3-4 and C6-7. Given degree of motion, CT cervical spine may be of added value. MRI thoracic spine: 1. Motion degraded examination.  No fracture or malalignment. 2. Stable degenerative change of the thoracic spine. No canal. Moderate RIGHT T1-2 neural foraminal narrowing. MRI lumbar spine: 1. Motion degraded examination. 2. Stable grade 1 L4-5 and L5-S1 anterolisthesis. Status post L5 laminectomies. 3. 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 4. Stable neural foraminal narrowing L2-3, L4-5 and L5-S1: Moderate on the RIGHT at L2-3. Electronically Signed   By: Elon Alas M.D.   On: 02/21/2018 23:24   Dg Chest Portable 1 View  Result Date: 02/21/2018 CLINICAL DATA:  Nausea, vomiting and depression after being treated for urinary tract  infection with Cipro. EXAM: PORTABLE CHEST 1 VIEW COMPARISON:  10/23/2017 FINDINGS: The heart size and mediastinal contours are within normal limits. Both lungs are clear. Stable mild elevation or eventration of the right hemidiaphragm. 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 IMPRESSION: No active disease. Electronically Signed   By: Ashley Royalty M.D.   On: 02/21/2018 18:15    Follow up with PCP in 1 week.  Management plans discussed with the patient, family and they are in agreement.  CODE STATUS: Full code    Code Status Orders  (From admission, onward)         Start     Ordered   02/22/18 0410  Full code  Continuous     02/22/18 0409        Code Status History    Date Active Date Inactive Code Status Order ID Comments User Context   10/24/2017 0235 10/25/2017 1939 Full Code [id number]  Arta Silence, MD Inpatient    Advance Directive Documentation     Most Recent Value  Type of Advance Directive  Healthcare Power of [place], Living will  Pre-existing out of facility DNR order (yellow form or pink MOST form)  -  "MOST" Form in Place?  -      TOTAL TIME TAKING CARE OF THIS PATIENT ON DAY OF DISCHARGE: more than 34 minutes.   Saundra Shelling M.D on 02/22/2018 at 1:05 PM  Between 7am to 6pm - Pager - [phone]  After 6pm go to [email or web address] - password [email or web address] [facility]  Office  [phone]  CC: Primary care physician; Tracie Harrier, MD  Note: This dictation was prepared with Dragon dictation along with smaller phrase technology. Any transcriptional errors that result from this process are unintentional.

## 2018-02-22 NOTE — Progress Notes (Signed)
Advanced care plan.  Purpose of the Encounter: CODE STATUS  Parties in Attendance: Patient and family  Patient's Decision Capacity:Good  Subjective/Patient's story: Presented for weakness and tremors   Objective/Medical story Patient has chronic spinal stenosis and chronic atrial fibrillation Needs work-up with MRI brain, MRI cervical spine thoracic spine to assess for any acute abnormality   Goals of care determination:  Advance care directives and goals of care discussed, patient wants everything done which includes cpr, intubation and ventilator if need arises.    CODE STATUS: Full code   Time spent discussing advanced care planning: 16 minutes

## 2018-02-22 NOTE — Care Management Obs Status (Signed)
[facility] NOTIFICATION   Patient Details  Name: Mark [name]. MRN: [id number] Date of Birth: 09-24-1940   Medicare Observation Status Notification Given:  Yes    Marshell Garfinkel, RN 02/22/2018, 10:51 AM

## 2018-02-22 NOTE — H&P (Signed)
Mark [name]. is an 77 y.o. male.   Chief Complaint: Weakness HPI: Patient with past medical history of chronic back pain due to spinal stenosis, atrial fibrillation and hyperlipidemia resents to the emergency department due to weakness.  The patient states that his back pain has resulted in an inability to stand without feeling as if he may fall over.  The patient does not have any current neurologic episodes his degree of pain and unsteadiness indicated that it may not be safe to discharge him home in the emergency department staff asked the hospitalist service to admit for neurosurgical evaluation.  Past Medical History:  Diagnosis Date  . Anxiety   . Atrial fibrillation ([place])   . Back pain, chronic   . Colon polyp   . Cyst of left kidney   . GERD (gastroesophageal reflux disease)   . Heart murmur   . Hyperlipemia   . Mitral valve prolapse   . Neuromuscular disorder ([place])   . Neuropathy   . Osteoarthritis   . Pulmonary emboli ([place])   . Spinal stenosis   . Thyroid nodule   . Vertigo     Past Surgical History:  Procedure Laterality Date  . APPENDECTOMY    . BACK SURGERY    . COLONOSCOPY N/A 08/15/2014   Procedure: COLONOSCOPY;  Surgeon: [name], MD;  Location: [facility] ENDOSCOPY;  Service: Endoscopy;  Laterality: N/A;  . ESOPHAGOGASTRODUODENOSCOPY N/A 08/15/2014   Procedure: ESOPHAGOGASTRODUODENOSCOPY (EGD);  Surgeon: [name], MD;  Location: [facility] ENDOSCOPY;  Service: Endoscopy;  Laterality: N/A;  . TONSILLECTOMY      No family history on file. Social History:  reports that he has never smoked. He has never used smokeless tobacco. He reports that he drinks about 1.0 standard drinks of alcohol per week. His drug history is not on file.  Allergies:  Allergies  Allergen Reactions  . Erythromycin Swelling  . Keflex [Cephalexin] Swelling  . Penicillins Swelling    Has patient had a PCN reaction causing immediate rash, facial/tongue/throat swelling, SOB or  lightheadedness with hypotension: Yes Has patient had a PCN reaction causing severe rash involving mucus membranes or skin necrosis: No Has patient had a PCN reaction that required hospitalization: Unknown Has patient had a PCN reaction occurring within the last 10 years: No If all of the above answers are "NO", then may proceed with Cephalosporin use.     Facility-Administered Medications Prior to Admission  Medication Dose Route Frequency Provider Last Rate Last Dose  . nystatin (MYCOSTATIN/NYSTOP) topical powder   Topical BID Hollice Espy, MD       Medications Prior to Admission  Medication Sig Dispense Refill  . apixaban (ELIQUIS) 2.5 MG TABS tablet Take 2.5 mg by mouth 2 (two) times daily.    Marland Kitchen buPROPion (WELLBUTRIN) 75 MG tablet Take 150 mg by mouth 2 (two) times daily.    . Coenzyme Q10 (CO Q 10 PO) Take 1 Dose by mouth daily.     . Cyanocobalamin (B-12 PO) Take 5,000 mg by mouth.    . esomeprazole (NEXIUM) 40 MG capsule Take 40 mg by mouth 3 (three) times a week.    . finasteride (PROSCAR) 5 MG tablet Take 5 mg by mouth daily.     Marland Kitchen FLUoxetine (PROZAC) 20 MG capsule Take 20 mg by mouth daily.    Marland Kitchen FLUoxetine (PROZAC) 40 MG capsule Take 40 mg by mouth daily. Total 60mg  daily    . ondansetron (ZOFRAN-ODT) 4 MG disintegrating tablet Take 4  mg by mouth every 8 (eight) hours as needed.    . simvastatin (ZOCOR) 40 MG tablet Take 40 mg by mouth at bedtime.     . tamsulosin (FLOMAX) 0.4 MG CAPS capsule Take 0.4 mg by mouth daily.       Results for orders placed or performed during the hospital encounter of 02/21/18 (from the past 48 hour(s))  Basic metabolic panel     Status: Abnormal   Collection Time: 02/21/18  4:25 PM  Result Value Ref Range   Sodium 138 135 - 145 mmol/L   Potassium 4.0 3.5 - 5.1 mmol/L   Chloride 104 98 - 111 mmol/L   CO2 26 22 - 32 mmol/L   Glucose, Bld 110 (H) 70 - 99 mg/dL   BUN 14 8 - 23 mg/dL   Creatinine, Ser 1.03 0.61 - 1.24 mg/dL   Calcium 9.2 8.9  - 10.3 mg/dL   GFR calc non Af Amer >60 >60 mL/min   GFR calc Af Amer >60 >60 mL/min   Anion gap 8 5 - 15    Comment: Performed at [facility], [address] [place], [place] [address]  CBC     Status: None   Collection Time: 02/21/18  4:25 PM  Result Value Ref Range   WBC 7.8 4.0 - 10.5 K/uL   RBC 5.23 4.22 - 5.81 MIL/uL   Hemoglobin 15.8 13.0 - 17.0 g/dL   HCT 47.8 39.0 - 52.0 %   MCV 91.4 80.0 - 100.0 fL   MCH 30.2 26.0 - 34.0 pg   MCHC 33.1 30.0 - 36.0 g/dL   RDW 13.2 11.5 - 15.5 %   Platelets 255 150 - 400 K/uL   nRBC 0.0 0.0 - 0.2 %    Comment: Performed at [facility], [address] [place], [place] [address]  Troponin I - Add-On to previous collection     Status: None   Collection Time: 02/21/18  4:25 PM  Result Value Ref Range   Troponin I <0.03 <0.03 ng/mL    Comment: Performed at [facility], [address] [place], [place] [address]  Brain natriuretic peptide     Status: None   Collection Time: 02/21/18  4:25 PM  Result Value Ref Range   B Natriuretic Peptide 47.0 0.0 - 100.0 pg/mL    Comment: Performed at [facility], [address] [place], [place] [address]  Hepatic function panel     Status: None   Collection Time: 02/21/18  4:25 PM  Result Value Ref Range   Total Protein 7.1 6.5 - 8.1 g/dL   Albumin 4.1 3.5 - 5.0 g/dL   AST 31 15 - 41 U/L   ALT 37 0 - 44 U/L   Alkaline Phosphatase 57 38 - 126 U/L   Total Bilirubin 0.9 0.3 - 1.2 mg/dL   Bilirubin, Direct 0.1 0.0 - 0.2 mg/dL   Indirect Bilirubin 0.8 0.3 - 0.9 mg/dL    Comment: Performed at [facility], [address] [place], [place] [address]  TSH     Status: None   Collection Time: 02/21/18  4:25 PM  Result Value Ref Range   TSH 0.866 0.350 - 4.500 uIU/mL    Comment: Performed by a 3rd Generation assay with a functional sensitivity of <=0.01 uIU/mL. Performed at [facility], [address], [place], [place] [address]   Urinalysis,  Complete w Microscopic     Status: Abnormal   Collection Time: 02/21/18  4:26 PM  Result Value Ref Range   Color, Urine YELLOW (A) YELLOW   APPearance HAZY (A) CLEAR   Specific Gravity, Urine 1.025 1.005 - 1.030   pH 5.0 5.0 - 8.0   Glucose, UA NEGATIVE NEGATIVE mg/dL   Hgb urine dipstick NEGATIVE NEGATIVE   Bilirubin Urine NEGATIVE NEGATIVE   Ketones, ur 5 (A) NEGATIVE mg/dL   Protein, ur NEGATIVE NEGATIVE mg/dL   Nitrite NEGATIVE NEGATIVE   Leukocytes, UA NEGATIVE NEGATIVE   RBC / HPF 0-5 0 - 5 RBC/hpf   WBC, UA 0-5 0 - 5 WBC/hpf   Bacteria, UA NONE SEEN NONE SEEN   Squamous Epithelial / LPF NONE SEEN 0 - 5   Mucus PRESENT     Comment: Performed at [facility], [address] [place], [place] [address]   Mr Brain Wo Contrast  Result Date: 02/21/2018 CLINICAL DATA:  Nausea, vomiting and weakness. Recent treated urinary tract infection. History of atrial fibrillation, hyperlipidemia, spinal stenosis, spine surgery. EXAM: MRI CERVICAL, THORACIC AND LUMBAR SPINE WITHOUT CONTRAST MRI HEAD WITHOUT CONTRAST TECHNIQUE: Multiplanar and multiecho pulse sequences of the cervical spine, to include the craniocervical junction and cervicothoracic junction, and thoracic and lumbar spine, were obtained without intravenous contrast. Multiplanar, multiecho pulse sequences of the brain and surrounding structures were obtained without intravenous contrast. COMPARISON:  None. MRI head November 21, 2017 and MRI thoracic spine November 12, 2016 and MRI lumbar spine April 21, 2016. FINDINGS: MRI HEAD FINDINGS INTRACRANIAL CONTENTS: No reduced diffusion to suggest acute ischemia or typical infection. No susceptibility artifact to suggest hemorrhage. The ventricles and sulci are normal for patient's age. Patchy to confluent supratentorial white matter FLAIR T2 hyperintensities. No suspicious parenchymal signal, masses, mass effect. No abnormal extra-axial fluid collections. No extra-axial masses.  VASCULAR: Normal major intracranial vascular flow voids present at skull base. SKULL AND UPPER CERVICAL SPINE: No abnormal sellar expansion. No suspicious calvarial bone marrow signal. Craniocervical junction maintained. SINUSES/ORBITS: The mastoid air-cells and included paranasal sinuses are well-aerated.The included ocular globes and orbital contents are non-suspicious. OTHER: None. MRI CERVICAL SPINE FINDINGS-moderately motion degraded sagittal sequences, axial sequences are moderate to severely motion degraded. ALIGNMENT: Straightened cervical lordosis. Grade 1 C3-4 anterolisthesis and C7-T1 anterolisthesis. VERTEBRAE/DISCS: Vertebral bodies are intact. Moderate C3-4 disc height loss with RIGHT T1, bright T2 signal within the disc, no STIR signal abnormality contiguous with LEFT facet, most compatible mineralization. Severe C5-6, C6-7 and moderate C7-T1 disc height loss, and disc desiccation compatible with degenerative discs. Moderate C5-6 and C6-7 subacute discogenic endplate changes. CORD: Linear bright STIR signal at C3-4 not localized on remaining sequences, potentially due to motion. POSTERIOR FOSSA, VERTEBRAL ARTERIES, PARASPINAL TISSUES: No MR findings of ligamentous injury. Vertebral artery flow voids present. Included posterior fossa and paraspinal soft tissues are normal. DISC LEVELS: Limited assessment due to motion. C2-3: Annular bulging, uncovertebral hypertrophy and severe RIGHT, moderate LEFT facet arthropathy. No canal stenosis or neural foraminal narrowing. C3-4: Anterolisthesis. Small broad-based disc bulge, uncovertebral hypertrophy in severe LEFT, mild RIGHT facet arthropathy. No canal stenosis. Moderate RIGHT and severe suspected LEFT neural foraminal narrowing. C4-5: Small broad-based disc osteophyte and vertebral hypertrophy and severe LEFT and moderate RIGHT facet arthropathy. No canal stenosis. Moderate suspected LEFT neural foraminal narrowing. C5-6: Small broad-based disc bulge,  uncovertebral hypertrophy and moderate LEFT, mild RIGHT facet arthropathy. No canal stenosis. Moderate suspected bilateral neural foraminal narrowing. C6-7: Small broad-based disc bulge, uncovertebral hypertrophy and mild facet arthropathy. No canal stenosis. Severe suspected neural narrowing. C7-T1: Anterolisthesis.  Annular bulging and severe RIGHT moderate LEFT facet arthropathy. No canal. Mild moderate RIGHT neural foraminal narrowing. MRI THORACIC SPINE FINDINGS-moderately motion degraded examination. ALIGNMENT: Maintenance of the thoracic kyphosis. No malalignment. VERTEBRAE/DISCS: Vertebral bodies are intact. Advanced upper thoracic disc height loss with bridging bone marrow signal compatible with arthrodesis. Multilevel moderate to severe disc height loss with disc desiccation compatible with degenerative discs. T6 acute discogenic endplate changes versus Schmorl's node. No suspicious bone marrow signal. CORD: Limited assessment spinal cord due to motion, no syrinx or definite myelomalacia. PREVERTEBRAL AND PARASPINAL SOFT TISSUES:  Non suspicious. DISC LEVELS: Small T4-5 central disc protrusion. No canal stenosis. Moderate RIGHT T1-2 neural foraminal narrowing. MRI LUMBAR SPINE FINDINGS-moderately motion degraded axial sequences. SEGMENTATION: For the purposes of this report, the last well-formed intervertebral disc is reported as L5-S1. Transitional anatomy with partially sacralized L5 vertebral body, consistent with prior MRI. ALIGNMENT: Maintained lumbar lordosis. Minimal stable grade 1 L4-5 anterolisthesis and grade 1 L5-S1 anterolisthesis. VERTEBRAE:Vertebral bodies are intact. Similar moderate L2-3 disc height loss, mild at L4-5. Diffuse disc desiccation multilevel mild chronic discogenic endplate changes. L4 inferior endplate acute Schmorl's node. Focal fat versus hemangioma RIGHT L5 pedicle. No suspicious bone marrow signal. CONUS MEDULLARIS AND CAUDA EQUINA: Conus medullaris terminates at L1 and  demonstrates normal morphology and signal characteristics. Limited assessment of cauda equina due to motion. PARASPINAL AND OTHER SOFT TISSUES: Non suspicious. Severe paraspinal muscle atrophy and below the level surgical intervention. DISC LEVELS: L1-2: No disc bulge, canal stenosis nor neural foraminal narrowing. Mild facet arthropathy. L2-3: Small broad-based disc bulge. Moderate facet arthropathy and ligamentum flavum redundancy. No canal stenosis. Moderate RIGHT and mild LEFT neural foraminal narrowing. L3-4: Annular bulging. Severe facet arthropathy and ligamentum flavum redundancy. Mild canal stenosis. No neural foraminal narrowing. L4-5: Anterolisthesis. Annular bulging. Moderate to severe facet arthropathy with trace facet effusions. 9 mm RIGHT facet synovial cyst within paraspinal soft tissues. Moderate canal stenosis. Mild bilateral neural foraminal narrowing. L5-S1: Posterior decompression. Anterolisthesis. Severe facet arthropathy without canal stenosis. Mild-to-moderate bilateral neural foraminal narrowing. IMPRESSION: MRI head: 1. No acute intracranial process. 2. Stable moderate chronic small vessel ischemic changes. MRI cervical spine: 1. Motion degraded examination. 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 2. Grade 1 C3-4 and C7-T1 anterolisthesis. No acute osseous process. 3. No canal stenosis. Neural foraminal narrowing C3-4 through C7-T1: Likely severe at C3-4 and C6-7. Given degree of motion, CT cervical spine may be of added value. MRI thoracic spine: 1. Motion degraded examination.  No fracture or malalignment. 2. Stable degenerative change of the thoracic spine. No canal. Moderate RIGHT T1-2 neural foraminal narrowing. MRI lumbar spine: 1. Motion degraded examination. 2. Stable grade 1 L4-5 and L5-S1 anterolisthesis. Status post L5 laminectomies. 3. 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 4. Stable neural foraminal narrowing L2-3, L4-5 and L5-S1: Moderate on the RIGHT at  L2-3. Electronically Signed   By: Elon Alas M.D.   On: 02/21/2018 23:24   Mr Cervical Spine Wo Contrast  Result Date: 02/21/2018 CLINICAL DATA:  Nausea, vomiting and weakness. Recent treated urinary tract infection. History of atrial fibrillation, hyperlipidemia, spinal stenosis, spine surgery. EXAM: MRI CERVICAL, THORACIC AND LUMBAR SPINE WITHOUT CONTRAST MRI HEAD WITHOUT CONTRAST TECHNIQUE: Multiplanar and multiecho pulse sequences of the cervical spine, to include the craniocervical junction and cervicothoracic junction, and thoracic and lumbar spine, were obtained without intravenous contrast. Multiplanar, multiecho pulse sequences of the brain and surrounding structures were obtained without intravenous contrast. COMPARISON:  None. MRI head November 21, 2017  and MRI thoracic spine November 12, 2016 and MRI lumbar spine April 21, 2016. FINDINGS: MRI HEAD FINDINGS INTRACRANIAL CONTENTS: No reduced diffusion to suggest acute ischemia or typical infection. No susceptibility artifact to suggest hemorrhage. The ventricles and sulci are normal for patient's age. Patchy to confluent supratentorial white matter FLAIR T2 hyperintensities. No suspicious parenchymal signal, masses, mass effect. No abnormal extra-axial fluid collections. No extra-axial masses. VASCULAR: Normal major intracranial vascular flow voids present at skull base. SKULL AND UPPER CERVICAL SPINE: No abnormal sellar expansion. No suspicious calvarial bone marrow signal. Craniocervical junction maintained. SINUSES/ORBITS: The mastoid air-cells and included paranasal sinuses are well-aerated.The included ocular globes and orbital contents are non-suspicious. OTHER: None. MRI CERVICAL SPINE FINDINGS-moderately motion degraded sagittal sequences, axial sequences are moderate to severely motion degraded. ALIGNMENT: Straightened cervical lordosis. Grade 1 C3-4 anterolisthesis and C7-T1 anterolisthesis. VERTEBRAE/DISCS: Vertebral bodies are  intact. Moderate C3-4 disc height loss with RIGHT T1, bright T2 signal within the disc, no STIR signal abnormality contiguous with LEFT facet, most compatible mineralization. Severe C5-6, C6-7 and moderate C7-T1 disc height loss, and disc desiccation compatible with degenerative discs. Moderate C5-6 and C6-7 subacute discogenic endplate changes. CORD: Linear bright STIR signal at C3-4 not localized on remaining sequences, potentially due to motion. POSTERIOR FOSSA, VERTEBRAL ARTERIES, PARASPINAL TISSUES: No MR findings of ligamentous injury. Vertebral artery flow voids present. Included posterior fossa and paraspinal soft tissues are normal. DISC LEVELS: Limited assessment due to motion. C2-3: Annular bulging, uncovertebral hypertrophy and severe RIGHT, moderate LEFT facet arthropathy. No canal stenosis or neural foraminal narrowing. C3-4: Anterolisthesis. Small broad-based disc bulge, uncovertebral hypertrophy in severe LEFT, mild RIGHT facet arthropathy. No canal stenosis. Moderate RIGHT and severe suspected LEFT neural foraminal narrowing. C4-5: Small broad-based disc osteophyte and vertebral hypertrophy and severe LEFT and moderate RIGHT facet arthropathy. No canal stenosis. Moderate suspected LEFT neural foraminal narrowing. C5-6: Small broad-based disc bulge, uncovertebral hypertrophy and moderate LEFT, mild RIGHT facet arthropathy. No canal stenosis. Moderate suspected bilateral neural foraminal narrowing. C6-7: Small broad-based disc bulge, uncovertebral hypertrophy and mild facet arthropathy. No canal stenosis. Severe suspected neural narrowing. C7-T1: Anterolisthesis. Annular bulging and severe RIGHT moderate LEFT facet arthropathy. No canal. Mild moderate RIGHT neural foraminal narrowing. MRI THORACIC SPINE FINDINGS-moderately motion degraded examination. ALIGNMENT: Maintenance of the thoracic kyphosis. No malalignment. VERTEBRAE/DISCS: Vertebral bodies are intact. Advanced upper thoracic disc height loss  with bridging bone marrow signal compatible with arthrodesis. Multilevel moderate to severe disc height loss with disc desiccation compatible with degenerative discs. T6 acute discogenic endplate changes versus Schmorl's node. No suspicious bone marrow signal. CORD: Limited assessment spinal cord due to motion, no syrinx or definite myelomalacia. PREVERTEBRAL AND PARASPINAL SOFT TISSUES:  Non suspicious. DISC LEVELS: Small T4-5 central disc protrusion. No canal stenosis. Moderate RIGHT T1-2 neural foraminal narrowing. MRI LUMBAR SPINE FINDINGS-moderately motion degraded axial sequences. SEGMENTATION: For the purposes of this report, the last well-formed intervertebral disc is reported as L5-S1. Transitional anatomy with partially sacralized L5 vertebral body, consistent with prior MRI. ALIGNMENT: Maintained lumbar lordosis. Minimal stable grade 1 L4-5 anterolisthesis and grade 1 L5-S1 anterolisthesis. VERTEBRAE:Vertebral bodies are intact. Similar moderate L2-3 disc height loss, mild at L4-5. Diffuse disc desiccation multilevel mild chronic discogenic endplate changes. L4 inferior endplate acute Schmorl's node. Focal fat versus hemangioma RIGHT L5 pedicle. No suspicious bone marrow signal. CONUS MEDULLARIS AND CAUDA EQUINA: Conus medullaris terminates at L1 and demonstrates normal morphology and signal characteristics. Limited assessment of cauda equina due to motion. PARASPINAL AND OTHER SOFT TISSUES:  Non suspicious. Severe paraspinal muscle atrophy and below the level surgical intervention. DISC LEVELS: L1-2: No disc bulge, canal stenosis nor neural foraminal narrowing. Mild facet arthropathy. L2-3: Small broad-based disc bulge. Moderate facet arthropathy and ligamentum flavum redundancy. No canal stenosis. Moderate RIGHT and mild LEFT neural foraminal narrowing. L3-4: Annular bulging. Severe facet arthropathy and ligamentum flavum redundancy. Mild canal stenosis. No neural foraminal narrowing. L4-5:  Anterolisthesis. Annular bulging. Moderate to severe facet arthropathy with trace facet effusions. 9 mm RIGHT facet synovial cyst within paraspinal soft tissues. Moderate canal stenosis. Mild bilateral neural foraminal narrowing. L5-S1: Posterior decompression. Anterolisthesis. Severe facet arthropathy without canal stenosis. Mild-to-moderate bilateral neural foraminal narrowing. IMPRESSION: MRI head: 1. No acute intracranial process. 2. Stable moderate chronic small vessel ischemic changes. MRI cervical spine: 1. Motion degraded examination. 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 2. Grade 1 C3-4 and C7-T1 anterolisthesis. No acute osseous process. 3. No canal stenosis. Neural foraminal narrowing C3-4 through C7-T1: Likely severe at C3-4 and C6-7. Given degree of motion, CT cervical spine may be of added value. MRI thoracic spine: 1. Motion degraded examination.  No fracture or malalignment. 2. Stable degenerative change of the thoracic spine. No canal. Moderate RIGHT T1-2 neural foraminal narrowing. MRI lumbar spine: 1. Motion degraded examination. 2. Stable grade 1 L4-5 and L5-S1 anterolisthesis. Status post L5 laminectomies. 3. 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 4. Stable neural foraminal narrowing L2-3, L4-5 and L5-S1: Moderate on the RIGHT at L2-3. Electronically Signed   By: Elon Alas M.D.   On: 02/21/2018 23:24   Mr Thoracic Spine Wo Contrast  Result Date: 02/21/2018 CLINICAL DATA:  Nausea, vomiting and weakness. Recent treated urinary tract infection. History of atrial fibrillation, hyperlipidemia, spinal stenosis, spine surgery. EXAM: MRI CERVICAL, THORACIC AND LUMBAR SPINE WITHOUT CONTRAST MRI HEAD WITHOUT CONTRAST TECHNIQUE: Multiplanar and multiecho pulse sequences of the cervical spine, to include the craniocervical junction and cervicothoracic junction, and thoracic and lumbar spine, were obtained without intravenous contrast. Multiplanar, multiecho pulse sequences of  the brain and surrounding structures were obtained without intravenous contrast. COMPARISON:  None. MRI head November 21, 2017 and MRI thoracic spine November 12, 2016 and MRI lumbar spine April 21, 2016. FINDINGS: MRI HEAD FINDINGS INTRACRANIAL CONTENTS: No reduced diffusion to suggest acute ischemia or typical infection. No susceptibility artifact to suggest hemorrhage. The ventricles and sulci are normal for patient's age. Patchy to confluent supratentorial white matter FLAIR T2 hyperintensities. No suspicious parenchymal signal, masses, mass effect. No abnormal extra-axial fluid collections. No extra-axial masses. VASCULAR: Normal major intracranial vascular flow voids present at skull base. SKULL AND UPPER CERVICAL SPINE: No abnormal sellar expansion. No suspicious calvarial bone marrow signal. Craniocervical junction maintained. SINUSES/ORBITS: The mastoid air-cells and included paranasal sinuses are well-aerated.The included ocular globes and orbital contents are non-suspicious. OTHER: None. MRI CERVICAL SPINE FINDINGS-moderately motion degraded sagittal sequences, axial sequences are moderate to severely motion degraded. ALIGNMENT: Straightened cervical lordosis. Grade 1 C3-4 anterolisthesis and C7-T1 anterolisthesis. VERTEBRAE/DISCS: Vertebral bodies are intact. Moderate C3-4 disc height loss with RIGHT T1, bright T2 signal within the disc, no STIR signal abnormality contiguous with LEFT facet, most compatible mineralization. Severe C5-6, C6-7 and moderate C7-T1 disc height loss, and disc desiccation compatible with degenerative discs. Moderate C5-6 and C6-7 subacute discogenic endplate changes. CORD: Linear bright STIR signal at C3-4 not localized on remaining sequences, potentially due to motion. POSTERIOR FOSSA, VERTEBRAL ARTERIES, PARASPINAL TISSUES: No MR findings of ligamentous injury. Vertebral artery flow voids present. Included posterior fossa and  paraspinal soft tissues are normal. DISC LEVELS:  Limited assessment due to motion. C2-3: Annular bulging, uncovertebral hypertrophy and severe RIGHT, moderate LEFT facet arthropathy. No canal stenosis or neural foraminal narrowing. C3-4: Anterolisthesis. Small broad-based disc bulge, uncovertebral hypertrophy in severe LEFT, mild RIGHT facet arthropathy. No canal stenosis. Moderate RIGHT and severe suspected LEFT neural foraminal narrowing. C4-5: Small broad-based disc osteophyte and vertebral hypertrophy and severe LEFT and moderate RIGHT facet arthropathy. No canal stenosis. Moderate suspected LEFT neural foraminal narrowing. C5-6: Small broad-based disc bulge, uncovertebral hypertrophy and moderate LEFT, mild RIGHT facet arthropathy. No canal stenosis. Moderate suspected bilateral neural foraminal narrowing. C6-7: Small broad-based disc bulge, uncovertebral hypertrophy and mild facet arthropathy. No canal stenosis. Severe suspected neural narrowing. C7-T1: Anterolisthesis. Annular bulging and severe RIGHT moderate LEFT facet arthropathy. No canal. Mild moderate RIGHT neural foraminal narrowing. MRI THORACIC SPINE FINDINGS-moderately motion degraded examination. ALIGNMENT: Maintenance of the thoracic kyphosis. No malalignment. VERTEBRAE/DISCS: Vertebral bodies are intact. Advanced upper thoracic disc height loss with bridging bone marrow signal compatible with arthrodesis. Multilevel moderate to severe disc height loss with disc desiccation compatible with degenerative discs. T6 acute discogenic endplate changes versus Schmorl's node. No suspicious bone marrow signal. CORD: Limited assessment spinal cord due to motion, no syrinx or definite myelomalacia. PREVERTEBRAL AND PARASPINAL SOFT TISSUES:  Non suspicious. DISC LEVELS: Small T4-5 central disc protrusion. No canal stenosis. Moderate RIGHT T1-2 neural foraminal narrowing. MRI LUMBAR SPINE FINDINGS-moderately motion degraded axial sequences. SEGMENTATION: For the purposes of this report, the last well-formed  intervertebral disc is reported as L5-S1. Transitional anatomy with partially sacralized L5 vertebral body, consistent with prior MRI. ALIGNMENT: Maintained lumbar lordosis. Minimal stable grade 1 L4-5 anterolisthesis and grade 1 L5-S1 anterolisthesis. VERTEBRAE:Vertebral bodies are intact. Similar moderate L2-3 disc height loss, mild at L4-5. Diffuse disc desiccation multilevel mild chronic discogenic endplate changes. L4 inferior endplate acute Schmorl's node. Focal fat versus hemangioma RIGHT L5 pedicle. No suspicious bone marrow signal. CONUS MEDULLARIS AND CAUDA EQUINA: Conus medullaris terminates at L1 and demonstrates normal morphology and signal characteristics. Limited assessment of cauda equina due to motion. PARASPINAL AND OTHER SOFT TISSUES: Non suspicious. Severe paraspinal muscle atrophy and below the level surgical intervention. DISC LEVELS: L1-2: No disc bulge, canal stenosis nor neural foraminal narrowing. Mild facet arthropathy. L2-3: Small broad-based disc bulge. Moderate facet arthropathy and ligamentum flavum redundancy. No canal stenosis. Moderate RIGHT and mild LEFT neural foraminal narrowing. L3-4: Annular bulging. Severe facet arthropathy and ligamentum flavum redundancy. Mild canal stenosis. No neural foraminal narrowing. L4-5: Anterolisthesis. Annular bulging. Moderate to severe facet arthropathy with trace facet effusions. 9 mm RIGHT facet synovial cyst within paraspinal soft tissues. Moderate canal stenosis. Mild bilateral neural foraminal narrowing. L5-S1: Posterior decompression. Anterolisthesis. Severe facet arthropathy without canal stenosis. Mild-to-moderate bilateral neural foraminal narrowing. IMPRESSION: MRI head: 1. No acute intracranial process. 2. Stable moderate chronic small vessel ischemic changes. MRI cervical spine: 1. Motion degraded examination. 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 2. Grade 1 C3-4 and C7-T1 anterolisthesis. No acute osseous process. 3. No canal  stenosis. Neural foraminal narrowing C3-4 through C7-T1: Likely severe at C3-4 and C6-7. Given degree of motion, CT cervical spine may be of added value. MRI thoracic spine: 1. Motion degraded examination.  No fracture or malalignment. 2. Stable degenerative change of the thoracic spine. No canal. Moderate RIGHT T1-2 neural foraminal narrowing. MRI lumbar spine: 1. Motion degraded examination. 2. Stable grade 1 L4-5 and L5-S1 anterolisthesis. Status post L5 laminectomies. 3. Stable moderate canal stenosis L4-5, mild  at L3-4. 4. Stable neural foraminal narrowing L2-3, L4-5 and L5-S1: Moderate on the RIGHT at L2-3. Electronically Signed   By: Elon Alas M.D.   On: 02/21/2018 23:24   Mr Lumbar Spine Wo Contrast  Result Date: 02/21/2018 CLINICAL DATA:  Nausea, vomiting and weakness. Recent treated urinary tract infection. History of atrial fibrillation, hyperlipidemia, spinal stenosis, spine surgery. EXAM: MRI CERVICAL, THORACIC AND LUMBAR SPINE WITHOUT CONTRAST MRI HEAD WITHOUT CONTRAST TECHNIQUE: Multiplanar and multiecho pulse sequences of the cervical spine, to include the craniocervical junction and cervicothoracic junction, and thoracic and lumbar spine, were obtained without intravenous contrast. Multiplanar, multiecho pulse sequences of the brain and surrounding structures were obtained without intravenous contrast. COMPARISON:  None. MRI head November 21, 2017 and MRI thoracic spine November 12, 2016 and MRI lumbar spine April 21, 2016. FINDINGS: MRI HEAD FINDINGS INTRACRANIAL CONTENTS: No reduced diffusion to suggest acute ischemia or typical infection. No susceptibility artifact to suggest hemorrhage. The ventricles and sulci are normal for patient's age. Patchy to confluent supratentorial white matter FLAIR T2 hyperintensities. No suspicious parenchymal signal, masses, mass effect. No abnormal extra-axial fluid collections. No extra-axial masses. VASCULAR: Normal major intracranial vascular flow  voids present at skull base. SKULL AND UPPER CERVICAL SPINE: No abnormal sellar expansion. No suspicious calvarial bone marrow signal. Craniocervical junction maintained. SINUSES/ORBITS: The mastoid air-cells and included paranasal sinuses are well-aerated.The included ocular globes and orbital contents are non-suspicious. OTHER: None. MRI CERVICAL SPINE FINDINGS-moderately motion degraded sagittal sequences, axial sequences are moderate to severely motion degraded. ALIGNMENT: Straightened cervical lordosis. Grade 1 C3-4 anterolisthesis and C7-T1 anterolisthesis. VERTEBRAE/DISCS: Vertebral bodies are intact. Moderate C3-4 disc height loss with RIGHT T1, bright T2 signal within the disc, no STIR signal abnormality contiguous with LEFT facet, most compatible mineralization. Severe C5-6, C6-7 and moderate C7-T1 disc height loss, and disc desiccation compatible with degenerative discs. Moderate C5-6 and C6-7 subacute discogenic endplate changes. CORD: Linear bright STIR signal at C3-4 not localized on remaining sequences, potentially due to motion. POSTERIOR FOSSA, VERTEBRAL ARTERIES, PARASPINAL TISSUES: No MR findings of ligamentous injury. Vertebral artery flow voids present. Included posterior fossa and paraspinal soft tissues are normal. DISC LEVELS: Limited assessment due to motion. C2-3: Annular bulging, uncovertebral hypertrophy and severe RIGHT, moderate LEFT facet arthropathy. No canal stenosis or neural foraminal narrowing. C3-4: Anterolisthesis. Small broad-based disc bulge, uncovertebral hypertrophy in severe LEFT, mild RIGHT facet arthropathy. No canal stenosis. Moderate RIGHT and severe suspected LEFT neural foraminal narrowing. C4-5: Small broad-based disc osteophyte and vertebral hypertrophy and severe LEFT and moderate RIGHT facet arthropathy. No canal stenosis. Moderate suspected LEFT neural foraminal narrowing. C5-6: Small broad-based disc bulge, uncovertebral hypertrophy and moderate LEFT, mild RIGHT  facet arthropathy. No canal stenosis. Moderate suspected bilateral neural foraminal narrowing. C6-7: Small broad-based disc bulge, uncovertebral hypertrophy and mild facet arthropathy. No canal stenosis. Severe suspected neural narrowing. C7-T1: Anterolisthesis. Annular bulging and severe RIGHT moderate LEFT facet arthropathy. No canal. Mild moderate RIGHT neural foraminal narrowing. MRI THORACIC SPINE FINDINGS-moderately motion degraded examination. ALIGNMENT: Maintenance of the thoracic kyphosis. No malalignment. VERTEBRAE/DISCS: Vertebral bodies are intact. Advanced upper thoracic disc height loss with bridging bone marrow signal compatible with arthrodesis. Multilevel moderate to severe disc height loss with disc desiccation compatible with degenerative discs. T6 acute discogenic endplate changes versus Schmorl's node. No suspicious bone marrow signal. CORD: Limited assessment spinal cord due to motion, no syrinx or definite myelomalacia. PREVERTEBRAL AND PARASPINAL SOFT TISSUES:  Non suspicious. DISC LEVELS: Small T4-5 central disc protrusion. No canal stenosis.  Moderate RIGHT T1-2 neural foraminal narrowing. MRI LUMBAR SPINE FINDINGS-moderately motion degraded axial sequences. SEGMENTATION: For the purposes of this report, the last well-formed intervertebral disc is reported as L5-S1. Transitional anatomy with partially sacralized L5 vertebral body, consistent with prior MRI. ALIGNMENT: Maintained lumbar lordosis. Minimal stable grade 1 L4-5 anterolisthesis and grade 1 L5-S1 anterolisthesis. VERTEBRAE:Vertebral bodies are intact. Similar moderate L2-3 disc height loss, mild at L4-5. Diffuse disc desiccation multilevel mild chronic discogenic endplate changes. L4 inferior endplate acute Schmorl's node. Focal fat versus hemangioma RIGHT L5 pedicle. No suspicious bone marrow signal. CONUS MEDULLARIS AND CAUDA EQUINA: Conus medullaris terminates at L1 and demonstrates normal morphology and signal characteristics.  Limited assessment of cauda equina due to motion. PARASPINAL AND OTHER SOFT TISSUES: Non suspicious. Severe paraspinal muscle atrophy and below the level surgical intervention. DISC LEVELS: L1-2: No disc bulge, canal stenosis nor neural foraminal narrowing. Mild facet arthropathy. L2-3: Small broad-based disc bulge. Moderate facet arthropathy and ligamentum flavum redundancy. No canal stenosis. Moderate RIGHT and mild LEFT neural foraminal narrowing. L3-4: Annular bulging. Severe facet arthropathy and ligamentum flavum redundancy. Mild canal stenosis. No neural foraminal narrowing. L4-5: Anterolisthesis. Annular bulging. Moderate to severe facet arthropathy with trace facet effusions. 9 mm RIGHT facet synovial cyst within paraspinal soft tissues. Moderate canal stenosis. Mild bilateral neural foraminal narrowing. L5-S1: Posterior decompression. Anterolisthesis. Severe facet arthropathy without canal stenosis. Mild-to-moderate bilateral neural foraminal narrowing. IMPRESSION: MRI head: 1. No acute intracranial process. 2. Stable moderate chronic small vessel ischemic changes. MRI cervical spine: 1. Motion degraded examination. 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 2. Grade 1 C3-4 and C7-T1 anterolisthesis. No acute osseous process. 3. No canal stenosis. Neural foraminal narrowing C3-4 through C7-T1: Likely severe at C3-4 and C6-7. Given degree of motion, CT cervical spine may be of added value. MRI thoracic spine: 1. Motion degraded examination.  No fracture or malalignment. 2. Stable degenerative change of the thoracic spine. No canal. Moderate RIGHT T1-2 neural foraminal narrowing. MRI lumbar spine: 1. Motion degraded examination. 2. Stable grade 1 L4-5 and L5-S1 anterolisthesis. Status post L5 laminectomies. 3. 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 4. Stable neural foraminal narrowing L2-3, L4-5 and L5-S1: Moderate on the RIGHT at L2-3. Electronically Signed   By: Elon Alas M.D.   On:  02/21/2018 23:24   Dg Chest Portable 1 View  Result Date: 02/21/2018 CLINICAL DATA:  Nausea, vomiting and depression after being treated for urinary tract infection with Cipro. EXAM: PORTABLE CHEST 1 VIEW COMPARISON:  10/23/2017 FINDINGS: The heart size and mediastinal contours are within normal limits. Both lungs are clear. Stable mild elevation or eventration of the right hemidiaphragm. 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 IMPRESSION: No active disease. Electronically Signed   By: Ashley Royalty M.D.   On: 02/21/2018 18:15    Review of Systems  Constitutional: Negative for chills and fever.  HENT: Negative for sore throat and tinnitus.   Eyes: Negative for blurred vision and redness.  Respiratory: Negative for cough and shortness of breath.   Cardiovascular: Negative for chest pain, palpitations, orthopnea and PND.  Gastrointestinal: Negative for abdominal pain, diarrhea, nausea and vomiting.  Genitourinary: Negative for dysuria, frequency and urgency.  Musculoskeletal: Positive for back pain. Negative for joint pain and myalgias.  Skin: Negative for rash.       No lesions  Neurological: Positive for weakness. Negative for speech change and focal weakness.  Endo/Heme/Allergies: Does not bruise/bleed easily.       No temperature  intolerance  Psychiatric/Behavioral: Negative for depression and suicidal ideas.    Blood pressure 116/73, pulse 78, temperature 98 F (36.7 C), temperature source Oral, resp. rate 18, height 6\' 3"  (1.905 m), weight 98.8 kg, SpO2 97 %. Physical Exam  Vitals reviewed. Constitutional: He is oriented to person, place, and time. He appears well-developed and well-nourished. No distress.  HENT:  Head: Normocephalic and atraumatic.  Mouth/Throat: Oropharynx is clear and moist.  Eyes: Pupils are equal, round, and reactive to light. Conjunctivae and EOM are normal. No scleral icterus.  Neck: Normal range of motion. Neck supple. No JVD  present. No tracheal deviation present. No thyromegaly present.  Cardiovascular: Normal rate, regular rhythm and normal heart sounds. Exam reveals no gallop and no friction rub.  No murmur heard. Respiratory: Effort normal and breath sounds normal. No respiratory distress.  GI: Soft. Bowel sounds are normal. He exhibits no distension. There is no tenderness.  Genitourinary:  Genitourinary Comments: Deferred  Musculoskeletal: Normal range of motion. He exhibits no edema.  Lymphadenopathy:    He has no cervical adenopathy.  Neurological: He is alert and oriented to person, place, and time. No cranial nerve deficit.  Skin: Skin is warm and dry. No rash noted. No erythema.  Psychiatric: He has a normal mood and affect. His behavior is normal. Judgment and thought content normal.     Assessment/Plan This is a 77 year old male admitted for weakness. 1.  Weakness: PT/OT while hospitalized. 2.  Spinal stenosis: The patient reports that his physicians have been considering a spinal stimulator.  Consult neurosurgery for input regarding a course forward to manage the patient's pain and mobility. 3.  Atrial fibrillation: Rate controlled; continue Eliquis 4.  BPH: Continue finasteride and tamsulosin 5.  Hyperlipidemia: Continue statin therapy 6.  Depression: Continue bupropion and fluoxetine 7.  DVT prophylaxis: Therapeutic anticoagulation 8.  GI prophylaxis: Continue pantoprazole per home regimen Exline the patient is a full code.  Time spent on admission orders and patient care approximately 45 minutes   Harrie Foreman, MD 02/22/2018, 9:36 AM

## 2018-02-22 NOTE — Evaluation (Signed)
Physical Therapy Evaluation Patient Details Name: Mark [name]. MRN: [id number] DOB: Dec 13, 1940 Today's Date: 02/22/2018   History of Present Illness  77 year old male with hx of recent UTI and fall about a week ago and now has weakness in LEs with MRI negative for CVA but imaging revealed C3-4 and C7-T1 narrowing and moderate narrowing at T1-2. PMH: mitral valve prolapse, PE, Factor V Leiden thrombophilia.  Clinical Impression  Pt has been having increased trouble with balance and ambulation recently since UTI.  He was able to do some basic mobility and transfers w/ only close assist, but consistently veered to the R and had some R sided coordination issues most noted during [place] tasks.  He was able to ambulate 100-125 ft with walker but consistently veering/leaning to the L and needed assist to stay upright with increased stance time, ultimately needed to sit rather quickly secondary to fatigue near 125 ft.  Pt reliant on the walker though he normally does not need any AD.  Pt will require HHPT to get back to his baseline activity/functional level.     Follow Up Recommendations Home health PT;Supervision/Assistance - 24 hour    Equipment Recommendations  None recommended by PT    Recommendations for Other Services       Precautions / Restrictions Precautions Precautions: Fall Precaution Comments: leaning mildly to the L in sitting and standing Restrictions Weight Bearing Restrictions: No      Mobility  Bed Mobility Overal bed mobility: Modified Independent             General bed mobility comments: Pt able to get to sitting w/o direct assist, needed rails and cuing  Transfers Overall transfer level: Modified independent Equipment used: Rolling walker (2 wheeled)             General transfer comment: Pt needed multiple attempts to stand independently with cues for U&LE set up and generally for sequencing  Ambulation/Gait Ambulation/Gait assistance: Min  assist Gait Distance (Feet): 125 Feet Assistive device: Rolling walker (2 wheeled)       General Gait Details: Pt reliant on the walker t/o the effort and showed decreased R LE quality of motion and foot placement.  He consistently leaned to the L and with turning to the R was outside BOS and had a few small LOBs that needed min assist and cuing to arrest.  Pt with decreased awareness despite consistent cuing.  Pt fatigued by the end and needed to sit down rather quickly.  Stairs            Wheelchair Mobility    Modified Rankin (Stroke Patients Only)       Balance Overall balance assessment: Needs assistance Sitting-balance support: Single extremity supported Sitting balance-Leahy Scale: Good Sitting balance - Comments: Pt with mild lean to the L, able to maintain static sitting balance   Standing balance support: Bilateral upper extremity supported Standing balance-Leahy Scale: Poor Standing balance comment: statically pt is able to maintain balance relatively well with walker, however he gets leaning to the L during activity                              Pertinent Vitals/Pain Pain Assessment: 0-10 Pain Score: 2  Faces Pain Scale: Hurts a little bit Pain Location: L LE pain with ambulation, no pain at rest Pain Descriptors / Indicators: Discomfort Pain Intervention(s): Monitored during session    Home Living Family/patient expects to  be discharged to:: Private residence Living Arrangements: Spouse/significant other Available Help at Discharge: Family Type of Home: House Home Access: Stairs to enter Entrance Stairs-Rails: None Entrance Stairs-Number of Steps: 2 Home Layout: One level Home Equipment: Environmental consultant - 2 wheels;Cane - quad;Shower seat - built in;Grab bars - tub/shower;Grab bars - toilet Additional Comments: family is considering installing a ramp or use a portable ramp     Prior Function Level of Independence: Independent with assistive device(s)          Comments: Pt was independent ambulating and getting dressed with min assist from his wife for pants and socks recently.  She does not leave him at home unsupervised.     Hand Dominance   Dominant Hand: Right    Extremity/Trunk Assessment   Upper Extremity Assessment Upper Extremity Assessment: Overall WFL for tasks assessed    Lower Extremity Assessment Lower Extremity Assessment: Overall WFL for tasks assessed(mild decreased L ankle DF and L knee extension)       Communication   Communication: No difficulties  Cognition Arousal/Alertness: Awake/alert Behavior During Therapy: Impulsive Overall Cognitive Status: History of cognitive impairments - at baseline Area of Impairment: Safety/judgement;Awareness                         Safety/Judgement: Decreased awareness of safety     General Comments: Pt was talking about events that occurred in August when asked about currently function and his wife did not leave him unsupervised at home and was assisting but stated that cognition changes have started since he had UTI.      General Comments      Exercises     Assessment/Plan    PT Assessment Patient needs continued PT services  PT Problem List Decreased strength;Decreased range of motion;Decreased activity tolerance;Decreased balance;Decreased mobility;Decreased coordination;Decreased cognition;Decreased knowledge of use of DME;Decreased safety awareness       PT Treatment Interventions DME instruction;Gait training;Stair training;Functional mobility training;Therapeutic activities;Therapeutic exercise;Balance training;Neuromuscular re-education;Patient/family education    PT Goals (Current goals can be found in the Care Plan section)  Acute Rehab PT Goals Patient Stated Goal: to go home PT Goal Formulation: With patient/family Time For Goal Achievement: 03/08/18 Potential to Achieve Goals: Fair    Frequency Min 2X/week   Barriers to  discharge        Co-evaluation               AM-PAC PT "6 Clicks" Mobility  Outcome Measure Help needed turning from your back to your side while in a flat bed without using bedrails?: None Help needed moving from lying on your back to sitting on the side of a flat bed without using bedrails?: A Little Help needed moving to and from a bed to a chair (including a wheelchair)?: A Little Help needed standing up from a chair using your arms (e.g., wheelchair or bedside chair)?: A Little Help needed to walk in hospital room?: A Little Help needed climbing 3-5 steps with a railing? : A Little 6 Click Score: 19    End of Session Equipment Utilized During Treatment: Gait belt Activity Tolerance: Patient tolerated treatment well;Patient limited by fatigue Patient left: with chair alarm set;with call bell/phone within reach;with family/visitor present Nurse Communication: Mobility status PT Visit Diagnosis: Muscle weakness (generalized) (M62.81);Difficulty in walking, not elsewhere classified (R26.2);Unsteadiness on feet (R26.81)    Time: 5809-9833 PT Time Calculation (min) (ACUTE ONLY): 27 min   Charges:   PT Evaluation $PT Eval Low  Complexity: 1 Low PT Treatments $Gait Training: 8-22 mins        Kreg Shropshire, DPT 02/22/2018, 12:00 PM

## 2018-03-01 ENCOUNTER — Emergency Department: Payer: Medicare Other

## 2018-03-01 ENCOUNTER — Encounter: Payer: Self-pay | Admitting: Emergency Medicine

## 2018-03-01 ENCOUNTER — Inpatient Hospital Stay
Admission: EM | Admit: 2018-03-01 | Discharge: 2018-03-04 | DRG: 199 | Disposition: A | Discharge status: Home Health | Payer: Medicare Other | Attending: Internal Medicine | Admitting: Internal Medicine

## 2018-03-01 ENCOUNTER — Other Ambulatory Visit: Payer: Self-pay

## 2018-03-01 DIAGNOSIS — E785 Hyperlipidemia, unspecified: Secondary | ICD-10-CM | POA: Diagnosis present

## 2018-03-01 DIAGNOSIS — K219 Gastro-esophageal reflux disease without esophagitis: Secondary | ICD-10-CM | POA: Diagnosis present

## 2018-03-01 DIAGNOSIS — I482 Chronic atrial fibrillation, unspecified: Secondary | ICD-10-CM | POA: Diagnosis present

## 2018-03-01 DIAGNOSIS — M549 Dorsalgia, unspecified: Secondary | ICD-10-CM | POA: Diagnosis present

## 2018-03-01 DIAGNOSIS — W19XXXA Unspecified fall, initial encounter: Secondary | ICD-10-CM | POA: Diagnosis present

## 2018-03-01 DIAGNOSIS — Z86711 Personal history of pulmonary embolism: Secondary | ICD-10-CM

## 2018-03-01 DIAGNOSIS — S270XXA Traumatic pneumothorax, initial encounter: Secondary | ICD-10-CM | POA: Diagnosis not present

## 2018-03-01 DIAGNOSIS — J942 Hemothorax: Secondary | ICD-10-CM

## 2018-03-01 DIAGNOSIS — F419 Anxiety disorder, unspecified: Secondary | ICD-10-CM | POA: Diagnosis present

## 2018-03-01 DIAGNOSIS — Z7901 Long term (current) use of anticoagulants: Secondary | ICD-10-CM

## 2018-03-01 DIAGNOSIS — Z888 Allergy status to other drugs, medicaments and biological substances status: Secondary | ICD-10-CM

## 2018-03-01 DIAGNOSIS — W01190A Fall on same level from slipping, tripping and stumbling with subsequent striking against furniture, initial encounter: Secondary | ICD-10-CM | POA: Diagnosis present

## 2018-03-01 DIAGNOSIS — Z880 Allergy status to penicillin: Secondary | ICD-10-CM

## 2018-03-01 DIAGNOSIS — J9811 Atelectasis: Secondary | ICD-10-CM | POA: Diagnosis not present

## 2018-03-01 DIAGNOSIS — R269 Unspecified abnormalities of gait and mobility: Secondary | ICD-10-CM | POA: Diagnosis not present

## 2018-03-01 DIAGNOSIS — G8929 Other chronic pain: Secondary | ICD-10-CM | POA: Diagnosis present

## 2018-03-01 DIAGNOSIS — J9601 Acute respiratory failure with hypoxia: Secondary | ICD-10-CM | POA: Diagnosis not present

## 2018-03-01 DIAGNOSIS — J90 Pleural effusion, not elsewhere classified: Secondary | ICD-10-CM | POA: Diagnosis present

## 2018-03-01 DIAGNOSIS — R2681 Unsteadiness on feet: Secondary | ICD-10-CM

## 2018-03-01 DIAGNOSIS — Z8719 Personal history of other diseases of the digestive system: Secondary | ICD-10-CM

## 2018-03-01 DIAGNOSIS — Y92009 Unspecified place in unspecified non-institutional (private) residence as the place of occurrence of the external cause: Secondary | ICD-10-CM

## 2018-03-01 LAB — URINALYSIS, COMPLETE (UACMP) WITH MICROSCOPIC
Bacteria, UA: NONE SEEN
Bilirubin Urine: NEGATIVE
Glucose, UA: NEGATIVE mg/dL
Hgb urine dipstick: NEGATIVE
Ketones, ur: NEGATIVE mg/dL
Nitrite: NEGATIVE
Protein, ur: 30 mg/dL — AB
Specific Gravity, Urine: 1.027 (ref 1.005–1.030)
WBC UA: NONE SEEN WBC/hpf (ref 0–5)
pH: 5 (ref 5.0–8.0)

## 2018-03-01 LAB — COMPREHENSIVE METABOLIC PANEL
ALT: 37 U/L (ref 0–44)
AST: 31 U/L (ref 15–41)
Albumin: 4.1 g/dL (ref 3.5–5.0)
Alkaline Phosphatase: 62 U/L (ref 38–126)
Anion gap: 9 (ref 5–15)
BUN: 20 mg/dL (ref 8–23)
CO2: 22 mmol/L (ref 22–32)
Calcium: 9.2 mg/dL (ref 8.9–10.3)
Chloride: 106 mmol/L (ref 98–111)
Creatinine, Ser: 0.95 mg/dL (ref 0.61–1.24)
GFR calc Af Amer: >60 mL/min (ref >60)
Glucose, Bld: 143 mg/dL — ABNORMAL HIGH (ref 70–99)
Potassium: 4.1 mmol/L (ref 3.5–5.1)
Sodium: 137 mmol/L (ref 135–145)
Total Bilirubin: 0.7 mg/dL (ref 0.3–1.2)
Total Protein: 7.3 g/dL (ref 6.5–8.1)

## 2018-03-01 LAB — CBC WITH DIFFERENTIAL/PLATELET
Abs Immature Granulocytes: 0.07 10*3/uL (ref 0.00–0.07)
Basophils Absolute: 0.1 10*3/uL (ref 0.0–0.1)
Basophils Relative: 1 %
EOS PCT: 3 %
Eosinophils Absolute: 0.3 10*3/uL (ref 0.0–0.5)
HCT: 48.9 % (ref 39.0–52.0)
HEMOGLOBIN: 16.3 g/dL (ref 13.0–17.0)
Immature Granulocytes: 1 %
LYMPHS PCT: 11 %
Lymphs Abs: 1.1 10*3/uL (ref 0.7–4.0)
MCH: 29.9 pg (ref 26.0–34.0)
MCHC: 33.3 g/dL (ref 30.0–36.0)
MCV: 89.6 fL (ref 80.0–100.0)
Monocytes Absolute: 0.9 10*3/uL (ref 0.1–1.0)
Monocytes Relative: 9 %
Neutro Abs: 7.8 10*3/uL — ABNORMAL HIGH (ref 1.7–7.7)
Neutrophils Relative %: 75 %
Platelets: 237 10*3/uL (ref 150–400)
RBC: 5.46 MIL/uL (ref 4.22–5.81)
RDW: 13.2 % (ref 11.5–15.5)
WBC: 10.2 10*3/uL (ref 4.0–10.5)
nRBC: 0 % (ref 0.0–0.2)

## 2018-03-01 NOTE — ED Triage Notes (Signed)
Pt to triage via w/c with no distress noted; reports PTA lost balance and fell; c/o pain to right lateral ribcage; denies hitting head, denies any other c/o

## 2018-03-01 NOTE — ED Provider Notes (Signed)
[facility] Emergency Department Provider Note  ____________________________________________   First MD Initiated Contact with Patient 03/01/18 2247     (approximate)  I have reviewed the triage vital signs and the nursing notes.   HISTORY  Chief Complaint Fall   HPI Mark [name]. is a 77 y.o. male who presents to the emergency department for treatment and evaluation after losing his balance and falling into the edge of a table. He is now nauseated and having pain in the right posteriolateral rib/back area. He denies striking his head or loss of consciousness. He is currently on Eliquis. He was recently admitted for gait difficulty and generalized weakness. Just prior to that, he was treated for UTI and nausea. Significant past medical history of Atrial fibrillation, mitral valve prolapse, neuromuscular disorder, and PE.   Past Medical History:  Diagnosis Date  . Anxiety   . Atrial fibrillation ([place])   . Back pain, chronic   . Colon polyp   . Cyst of left kidney   . GERD (gastroesophageal reflux disease)   . Heart murmur   . Hyperlipemia   . Mitral valve prolapse   . Neuromuscular disorder ([place])   . Neuropathy   . Osteoarthritis   . Pulmonary emboli ([place])   . Spinal stenosis   . Thyroid nodule   . Vertigo     Patient Active Problem List   Diagnosis Date Noted  . Weakness 02/22/2018  . Near syncope 10/24/2017  . Orthostatic hypotension 10/24/2017  . History of pulmonary embolus (PE) 08/12/2017  . PAF (paroxysmal atrial fibrillation) ([place]) 08/12/2017  . 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 Abnormal cardiovascular function study 01/07/2017  . Lightheadedness 12/23/2016  . Lower extremity numbness 12/23/2016  . Orthostatic dizziness 12/23/2016  . Weakness of both lower extremities 12/23/2016  . Arthritis 11/17/2016  . SOB (shortness of breath) 11/16/2016  . DDD (degenerative disc disease), lumbar 07/01/2016  .  Lumbosacral spondylosis without myelopathy 01/07/2016  . Chronic bilateral low back pain without sciatica 10/10/2015  . Primary osteoarthritis of right shoulder 06/25/2015  . H/O adenomatous polyp of colon 07/20/2014  . Chronic anticoagulation 10/04/2012  . Anxiety 09/11/2011  . Cyst of left kidney 09/11/2011  . GERD (gastroesophageal reflux disease) 09/11/2011  . Heart murmur 09/11/2011  . Hyperlipidemia 09/11/2011  . Neuropathy 09/11/2011  . Pulmonary embolus ([place]) 09/11/2011    Past Surgical History:  Procedure Laterality Date  . APPENDECTOMY    . BACK SURGERY    . COLONOSCOPY N/A 08/15/2014   Procedure: COLONOSCOPY;  Surgeon: [name], MD;  Location: [facility] ENDOSCOPY;  Service: Endoscopy;  Laterality: N/A;  . ESOPHAGOGASTRODUODENOSCOPY N/A 08/15/2014   Procedure: ESOPHAGOGASTRODUODENOSCOPY (EGD);  Surgeon: [name], MD;  Location: [facility] ENDOSCOPY;  Service: Endoscopy;  Laterality: N/A;  . TONSILLECTOMY      Prior to Admission medications   Medication Sig Start Date End Date Taking? Authorizing Provider  apixaban (ELIQUIS) 2.5 MG TABS tablet Take 2.5 mg by mouth 2 (two) times daily.    [provider]  buPROPion (WELLBUTRIN) 75 MG tablet Take 150 mg by mouth 2 (two) times daily. 02/11/18   [provider]  Coenzyme Q10 (CO Q 10 PO) Take 1 Dose by mouth daily.     [provider]  Cyanocobalamin (B-12) 5000 MCG CAPS Take 5,000 mg by mouth daily. 02/22/18   Saundra Shelling, MD  esomeprazole (NEXIUM) 40 MG capsule Take 40 mg by mouth 3 (three) times a week.  02/21/18 02/21/19  [provider]  finasteride (PROSCAR) 5 MG tablet Take 5 mg by mouth daily.  12/01/16   [provider]  FLUoxetine (PROZAC) 20 MG capsule Take 20 mg by mouth daily.    [provider]  FLUoxetine (PROZAC) 40 MG capsule Take 40 mg by mouth daily. Total 60mg  daily    [provider]  ondansetron (ZOFRAN-ODT) 4 MG disintegrating tablet Take 4  mg by mouth every 8 (eight) hours as needed. 02/11/18   [provider]  simvastatin (ZOCOR) 40 MG tablet Take 40 mg by mouth at bedtime.     [provider]  tamsulosin (FLOMAX) 0.4 MG CAPS capsule Take 0.4 mg by mouth daily.     [provider]    Allergies Erythromycin; Keflex [cephalexin]; and Penicillins  No family history on file.  Social History Social History   Tobacco Use  . Smoking status: Never Smoker  . Smokeless tobacco: Never Used  Substance Use Topics  . Alcohol use: Yes    Alcohol/week: 1.0 standard drinks    Types: 1 Glasses of wine per week  . Drug use: Not on file    Review of Systems  Constitutional: No fever/chills Eyes: No visual changes. ENT: No sore throat. Cardiovascular: Denies chest pain. Respiratory: Denies shortness of breath. Gastrointestinal: No abdominal pain.  Positive for nausea, no vomiting.  No diarrhea.  No constipation. Genitourinary: Negative for dysuria. Musculoskeletal: Positive for posterior and lateral rib/back pain. Skin: Positive for ecchymosis to the right rib/back. Neurological: Negative for headaches, focal weakness or numbness. ____________________________________________   PHYSICAL EXAM:  VITAL SIGNS: ED Triage Vitals [03/01/18 2153]  Enc Vitals Group     BP (!) 144/83     Pulse Rate 88     Resp 20     Temp 98.3 F (36.8 C)     Temp Source Oral     SpO2 94 %     Weight 220 lb (99.8 kg)     Height 6\' 4"  (1.93 m)     Head Circumference      Peak Flow      Pain Score 10     Pain Loc      Pain Edu?      Excl. in [place]?     Constitutional: Alert and oriented. Well appearing and in no acute distress. Eyes: Conjunctivae are normal. Head: Atraumatic. Nose: No epistaxis. Mouth/Throat: Mucous membranes are moist.  Oropharynx non-erythematous. Neck: No stridor. No cervical spine tenderness to palpation. Cardiovascular: Normal rate, regular rhythm.  Good peripheral  circulation. Respiratory: Normal respiratory effort.  No retractions. Lungs CTAB. Gastrointestinal: Soft and nontender. No distention. No CVA tenderness. Musculoskeletal: No lower extremity tenderness nor edema.  No joint effusions. Neurologic:  Normal speech and language. No gross focal neurologic deficits are appreciated. Skin:  Skin is warm, dry and intact. Ecchymosis over the posteriolateral right rib/back with diffuse tenderness in the area. No open wounds or lesions.  Psychiatric: Mood and affect are normal. Speech and behavior are normal.  ____________________________________________   LABS (all labs ordered are listed, but only abnormal results are displayed)  Labs Reviewed  CBC WITH DIFFERENTIAL/PLATELET - Abnormal; Notable for the following components:      Result Value   Neutro Abs 7.8 (*)    All other components within normal limits  COMPREHENSIVE METABOLIC PANEL  URINALYSIS, COMPLETE (UACMP) WITH MICROSCOPIC   ____________________________________________  EKG   ____________________________________________  RADIOLOGY  ED MD interpretation:  No acute osseous abnormality  on chest x-ray per radiology.  Official radiology report(s): Dg Chest 2 View  Result Date: 03/01/2018 CLINICAL DATA:  77 y/o  M; right rib pain after fall. EXAM: CHEST - 2 VIEW COMPARISON:  02/21/2018 chest radiograph FINDINGS: Stable heart size and mediastinal contours are within normal limits. Both lungs are clear. No acute osseous abnormality identified. IMPRESSION: 1. No active cardiopulmonary disease. 2. No acute osseous abnormality identified. Consider dedicated rib radiographs if there is focal tenderness. Electronically Signed   By: Kristine Garbe M.D.   On: 03/01/2018 22:33    ____________________________________________   PROCEDURES  Procedure(s) performed: None  Procedures  Critical Care performed: No  ____________________________________________   INITIAL IMPRESSION  / ASSESSMENT AND PLAN / ED COURSE  As part of my medical decision making, I reviewed the following data within the electronic MEDICAL RECORD NUMBER Notes from prior ED visits   77 year old male presenting to the emergency department for treatment and evaluation after a non-syncopal fall earlier this evening. He will require a more in depth work up and will therefore be moved from room 54 to 2. Report to Dr. Owens Shark.      ____________________________________________   FINAL CLINICAL IMPRESSION(S) / ED DIAGNOSES  Final diagnoses:  None     ED Discharge Orders    None       Note:  This document was prepared using Dragon voice recognition software and may include unintentional dictation errors.    Victorino Dike, FNP 03/01/18 2332    Gregor Hams, MD 03/04/18 [phone]

## 2018-03-02 ENCOUNTER — Emergency Department: Payer: Medicare Other

## 2018-03-02 ENCOUNTER — Other Ambulatory Visit: Payer: Self-pay

## 2018-03-02 DIAGNOSIS — J90 Pleural effusion, not elsewhere classified: Secondary | ICD-10-CM

## 2018-03-02 DIAGNOSIS — W19XXXA Unspecified fall, initial encounter: Secondary | ICD-10-CM | POA: Diagnosis present

## 2018-03-02 LAB — GLUCOSE, CAPILLARY: Glucose-Capillary: 111 mg/dL — ABNORMAL HIGH (ref 70–99)

## 2018-03-02 LAB — BASIC METABOLIC PANEL
Anion gap: 8 (ref 5–15)
BUN: 21 mg/dL (ref 8–23)
CALCIUM: 9 mg/dL (ref 8.9–10.3)
CO2: 22 mmol/L (ref 22–32)
CREATININE: 1.03 mg/dL (ref 0.61–1.24)
Chloride: 107 mmol/L (ref 98–111)
GFR calc Af Amer: >60 mL/min (ref >60)
GFR calc non Af Amer: >60 mL/min (ref >60)
Glucose, Bld: 135 mg/dL — ABNORMAL HIGH (ref 70–99)
Potassium: 4 mmol/L (ref 3.5–5.1)
Sodium: 137 mmol/L (ref 135–145)

## 2018-03-02 LAB — CBC
HCT: 47.7 % (ref 39.0–52.0)
HEMOGLOBIN: 15.6 g/dL (ref 13.0–17.0)
MCH: 30 pg (ref 26.0–34.0)
MCHC: 32.7 g/dL (ref 30.0–36.0)
MCV: 91.7 fL (ref 80.0–100.0)
Platelets: 237 10*3/uL (ref 150–400)
RBC: 5.2 MIL/uL (ref 4.22–5.81)
RDW: 13.2 % (ref 11.5–15.5)
WBC: 11.2 10*3/uL — ABNORMAL HIGH (ref 4.0–10.5)
nRBC: 0 % (ref 0.0–0.2)

## 2018-03-02 MED ORDER — ONDANSETRON HCL 4 MG PO TABS: 4.0000 mg | ORAL_TABLET | Freq: Four times a day (QID) | ORAL | Status: DC | PRN

## 2018-03-02 MED ORDER — MORPHINE SULFATE (PF) 2 MG/ML IV SOLN
2.0000 mg | Freq: Once | INTRAVENOUS | Status: AC
  Administered 2018-03-02: 2 mg via INTRAVENOUS

## 2018-03-02 MED ORDER — VITAMIN B-12 1000 MCG PO TABS
5000.0000 ug | ORAL_TABLET | Freq: Every day | ORAL | Status: DC
  Administered 2018-03-02 – 2018-03-03 (×2): 5000 ug via ORAL
  Filled 2018-03-02 (×2): qty 5

## 2018-03-02 MED ORDER — ONDANSETRON HCL 4 MG/2ML IJ SOLN: 4.0000 mg | Freq: Four times a day (QID) | INTRAMUSCULAR | Status: DC | PRN

## 2018-03-02 MED ORDER — SODIUM CHLORIDE 0.9 % IV SOLN
Freq: Once | INTRAVENOUS | Status: AC
  Administered 2018-03-02: 03:00:00 via INTRAVENOUS

## 2018-03-02 MED ORDER — DOCUSATE SODIUM 100 MG PO CAPS
100.0000 mg | ORAL_CAPSULE | Freq: Two times a day (BID) | ORAL | Status: DC
  Administered 2018-03-02 – 2018-03-03 (×4): 100 mg via ORAL
  Filled 2018-03-02 (×4): qty 1

## 2018-03-02 MED ORDER — HYDROCODONE-ACETAMINOPHEN 5-325 MG PO TABS
1.0000 | ORAL_TABLET | ORAL | Status: DC | PRN
  Administered 2018-03-02 – 2018-03-04 (×5): 1 via ORAL
  Filled 2018-03-02 (×5): qty 1

## 2018-03-02 MED ORDER — ADULT MULTIVITAMIN W/MINERALS CH
1.0000 | ORAL_TABLET | Freq: Every day | ORAL | Status: DC
  Administered 2018-03-02 – 2018-03-03 (×2): 1 via ORAL
  Filled 2018-03-02 (×2): qty 1

## 2018-03-02 MED ORDER — BISACODYL 5 MG PO TBEC
5.0000 mg | DELAYED_RELEASE_TABLET | Freq: Every day | ORAL | Status: DC | PRN
  Administered 2018-03-03: 5 mg via ORAL
  Filled 2018-03-02: qty 1

## 2018-03-02 MED ORDER — APIXABAN 2.5 MG PO TABS
2.5000 mg | ORAL_TABLET | Freq: Two times a day (BID) | ORAL | Status: DC
  Administered 2018-03-02: 2.5 mg via ORAL
  Filled 2018-03-02: qty 1

## 2018-03-02 MED ORDER — ACETAMINOPHEN 650 MG RE SUPP: 650.0000 mg | Freq: Four times a day (QID) | RECTAL | Status: DC | PRN

## 2018-03-02 MED ORDER — SIMVASTATIN 40 MG PO TABS
40.0000 mg | ORAL_TABLET | Freq: Every day | ORAL | Status: DC
  Administered 2018-03-02 – 2018-03-03 (×2): 40 mg via ORAL
  Filled 2018-03-02 (×2): qty 1

## 2018-03-02 MED ORDER — APIXABAN 2.5 MG PO TABS: 2.5000 mg | ORAL_TABLET | Freq: Two times a day (BID) | ORAL | Status: DC

## 2018-03-02 MED ORDER — MORPHINE SULFATE (PF) 2 MG/ML IV SOLN
INTRAVENOUS | Status: AC
  Filled 2018-03-02: qty 1

## 2018-03-02 MED ORDER — TAMSULOSIN HCL 0.4 MG PO CAPS
0.4000 mg | ORAL_CAPSULE | Freq: Every day | ORAL | Status: DC
  Administered 2018-03-02 – 2018-03-03 (×2): 0.4 mg via ORAL
  Filled 2018-03-02 (×2): qty 1

## 2018-03-02 MED ORDER — ENSURE ENLIVE PO LIQD
237.0000 mL | Freq: Two times a day (BID) | ORAL | Status: DC
  Administered 2018-03-02 – 2018-03-03 (×3): 237 mL via ORAL

## 2018-03-02 MED ORDER — TRAZODONE HCL 50 MG PO TABS
25.0000 mg | ORAL_TABLET | Freq: Every evening | ORAL | Status: DC | PRN
  Filled 2018-03-02: qty 1

## 2018-03-02 MED ORDER — ACETAMINOPHEN 325 MG PO TABS: 650.0000 mg | ORAL_TABLET | Freq: Four times a day (QID) | ORAL | Status: DC | PRN

## 2018-03-02 MED ORDER — PANTOPRAZOLE SODIUM 40 MG PO TBEC
40.0000 mg | DELAYED_RELEASE_TABLET | Freq: Every day | ORAL | Status: DC
  Administered 2018-03-02 – 2018-03-03 (×2): 40 mg via ORAL
  Filled 2018-03-02 (×2): qty 1

## 2018-03-02 MED ORDER — CO Q 10 10 MG PO CAPS: ORAL_CAPSULE | Freq: Every day | ORAL | Status: DC

## 2018-03-02 MED ORDER — FINASTERIDE 5 MG PO TABS
5.0000 mg | ORAL_TABLET | Freq: Every day | ORAL | Status: DC
  Administered 2018-03-02 – 2018-03-03 (×2): 5 mg via ORAL
  Filled 2018-03-02 (×2): qty 1

## 2018-03-02 MED ORDER — FLUOXETINE HCL 20 MG PO CAPS
40.0000 mg | ORAL_CAPSULE | Freq: Every day | ORAL | Status: DC
  Administered 2018-03-02 – 2018-03-03 (×2): 40 mg via ORAL
  Filled 2018-03-02 (×4): qty 2

## 2018-03-02 NOTE — H&P (Signed)
[facility] [place] NAME: Mark [name]    MR#:  [id number]  DATE OF BIRTH:  1941/02/23  DATE OF ADMISSION:  03/01/2018  PRIMARY CARE PHYSICIAN: Tracie Harrier, MD   REQUESTING/REFERRING PHYSICIAN:   CHIEF COMPLAINT:   Chief Complaint  Patient presents with  . Fall    HISTORY OF PRESENT ILLNESS: Mark [name]  is a 77 y.o. male with a known history of atrial fibrillation, chronic back pain, neuropathy, PE and factor V deficiency, on chronic anticoagulation with Eliquis. Patient was brought to emergency room status post fall at home.  He lost his balance and fell onto the edge of the table.  He complains of severe right posterolateral rib/back area, since the fall.  There was no head trauma, no loss of consciousness.  No chest pain, palpitations, fever or chills. Per patient's wife, he has been more drowsy and with poor balance in the past 2 to 3 weeks.  He was treated for UTI 2-3 weeks ago and after that patient seemed to be more depressed, so his Prozac dose was increased and Wellbutrin was added.  His depression symptoms do not improve since the new changes.  The only other new medication is Requip. Blood test done emergency room, including CBC and CMP are grossly unremarkable. CT scan of the chest shows small to moderate-sized right pleural effusion with medium and lower density components. This could represent a hemothorax or effusion with proteinaceous fluid. Patient is admitted for further evaluation and treatment.  PAST MEDICAL HISTORY:   Past Medical History:  Diagnosis Date  . Anxiety   . Atrial fibrillation ([place])   . Back pain, chronic   . Colon polyp   . Cyst of left kidney   . GERD (gastroesophageal reflux disease)   . Heart murmur   . Hyperlipemia   . Mitral valve prolapse   . Neuromuscular disorder ([place])   . Neuropathy   . Osteoarthritis   . Pulmonary emboli ([place])   . Spinal stenosis   . Thyroid nodule   .  Vertigo     PAST SURGICAL HISTORY:  Past Surgical History:  Procedure Laterality Date  . APPENDECTOMY    . BACK SURGERY    . COLONOSCOPY N/A 08/15/2014   Procedure: COLONOSCOPY;  Surgeon: [name], MD;  Location: [facility] ENDOSCOPY;  Service: Endoscopy;  Laterality: N/A;  . ESOPHAGOGASTRODUODENOSCOPY N/A 08/15/2014   Procedure: ESOPHAGOGASTRODUODENOSCOPY (EGD);  Surgeon: [name], MD;  Location: [facility] ENDOSCOPY;  Service: Endoscopy;  Laterality: N/A;  . TONSILLECTOMY      SOCIAL HISTORY:  Social History   Tobacco Use  . Smoking status: Never Smoker  . Smokeless tobacco: Never Used  Substance Use Topics  . Alcohol use: Yes    Alcohol/week: 1.0 standard drinks    Types: 1 Glasses of wine per week    FAMILY HISTORY: No family history on file.  DRUG ALLERGIES:  Allergies  Allergen Reactions  . Erythromycin Swelling  . Keflex [Cephalexin] Swelling  . Penicillins Swelling    Has patient had a PCN reaction causing immediate rash, facial/tongue/throat swelling, SOB or lightheadedness with hypotension: Yes Has patient had a PCN reaction causing severe rash involving mucus membranes or skin necrosis: No Has patient had a PCN reaction that required hospitalization: Unknown Has patient had a PCN reaction occurring within the last 10 years: No If all of the above answers are "NO", then may proceed with Cephalosporin use.     REVIEW  OF SYSTEMS:   CONSTITUTIONAL: No fever, but positive for fatigue and generalized weakness.  EYES: No changes in vision.  EARS, NOSE, AND THROAT: No tinnitus or ear pain.  RESPIRATORY: No cough, shortness of breath, wheezing or hemoptysis.  CARDIOVASCULAR: No chest pain, orthopnea, edema.  GASTROINTESTINAL: Positive for occasional nausea, no vomiting, diarrhea or abdominal pain.  GENITOURINARY: No dysuria, hematuria.  ENDOCRINE: No polyuria, nocturia. HEMATOLOGY: Positive for hemothorax status post fall. SKIN: No rash or  lesion. MUSCULOSKELETAL: Positive for pain at the right posteriolateral rib/back area.   NEUROLOGIC: No focal weakness.  PSYCHIATRY: Positive for depression disorder.  Patient denies any suicidal ideation.  MEDICATIONS AT HOME:  Prior to Admission medications   Medication Sig Start Date End Date Taking? Authorizing Provider  apixaban (ELIQUIS) 2.5 MG TABS tablet Take 2.5 mg by mouth 2 (two) times daily.   Yes [provider]  buPROPion (WELLBUTRIN) 75 MG tablet Take 150 mg by mouth 2 (two) times daily. 02/11/18  Yes [provider]  clotrimazole-betamethasone (LOTRISONE) cream Apply 1 application topically 2 (two) times daily.    Yes [provider]  Coenzyme Q10 (CO Q 10 PO) Take 1 Dose by mouth daily.    Yes [provider]  Cyanocobalamin (B-12) 5000 MCG CAPS Take 5,000 mg by mouth daily. Patient taking differently: Take 5,000 mcg by mouth daily.  02/22/18  Yes Pyreddy, Reatha Harps, MD  esomeprazole (NEXIUM) 40 MG capsule Take 40 mg by mouth daily.  02/21/18 02/21/19 Yes [provider]  finasteride (PROSCAR) 5 MG tablet Take 5 mg by mouth daily.  12/01/16  Yes [provider]  FLUoxetine (PROZAC) 20 MG capsule Take 20 mg by mouth daily.   Yes [provider]  FLUoxetine (PROZAC) 40 MG capsule Take 40 mg by mouth daily. Total 60mg  daily   Yes [provider]  ondansetron (ZOFRAN-ODT) 4 MG disintegrating tablet Take 4 mg by mouth every 8 (eight) hours as needed. 02/11/18  Yes [provider]  rOPINIRole (REQUIP) 0.5 MG tablet Take 1 tablet by mouth at bedtime. 02/11/18  Yes [provider]  simvastatin (ZOCOR) 40 MG tablet Take 40 mg by mouth at bedtime.    Yes [provider]  tamsulosin (FLOMAX) 0.4 MG CAPS capsule Take 0.4 mg by mouth daily.    Yes [provider]      PHYSICAL EXAMINATION:   VITAL SIGNS: Blood pressure 136/86, pulse 94, temperature 98.3 F (36.8 C), temperature source  Oral, resp. rate 20, height 6\' 4"  (1.93 m), weight 99.8 kg, SpO2 94 %.  GENERAL:  77 y.o.-year-old patient lying in the bed with mild distress, secondary to pain, which is actually improved now after pain medication.  EYES: Pupils equal, round, reactive to light and accommodation. No scleral icterus. Extraocular muscles intact.  HEENT: Head atraumatic, normocephalic. Oropharynx and nasopharynx clear.  NECK:  Supple, no jugular venous distention. No thyroid enlargement, no tenderness.  LUNGS: Reduced breath sounds at the right lung base, no wheezing, rales,rhonchi or crepitation. No use of accessory muscles of respiration.  CARDIOVASCULAR: S1, S2 normal. No S3/S4.  ABDOMEN: Soft, nontender, nondistended. Bowel sounds present. No organomegaly or mass.  EXTREMITIES: Poor balance noted.  Patient is unable to ambulate without assistance. NEUROLOGIC: No focal weakness. PSYCHIATRIC: The patient is alert and oriented x 3.  SKIN: No obvious rash, lesion, or ulcer.   LABORATORY PANEL:   CBC Recent Labs  Lab 03/01/18 2311  WBC 10.2  HGB 16.3  HCT 48.9  PLT  237  MCV 89.6  MCH 29.9  MCHC 33.3  RDW 13.2  LYMPHSABS 1.1  MONOABS 0.9  EOSABS 0.3  BASOSABS 0.1   ------------------------------------------------------------------------------------------------------------------  Chemistries  Recent Labs  Lab 03/01/18 2311  NA 137  K 4.1  CL 106  CO2 22  GLUCOSE 143*  BUN 20  CREATININE 0.95  CALCIUM 9.2  AST 31  ALT 37  ALKPHOS 62  BILITOT 0.7   ------------------------------------------------------------------------------------------------------------------ estimated creatinine clearance is 77.4 mL/min (by C-G formula based on SCr of 0.95 mg/dL). ------------------------------------------------------------------------------------------------------------------ No results for input(s): TSH, T4TOTAL, T3FREE, THYROIDAB in the last 72 hours.  Invalid input(s): FREET3   Coagulation  profile No results for input(s): INR, PROTIME in the last 168 hours. ------------------------------------------------------------------------------------------------------------------- No results for input(s): DDIMER in the last 72 hours. -------------------------------------------------------------------------------------------------------------------  Cardiac Enzymes No results for input(s): CKMB, TROPONINI, MYOGLOBIN in the last 168 hours.  Invalid input(s): CK ------------------------------------------------------------------------------------------------------------------ Invalid input(s): POCBNP  ---------------------------------------------------------------------------------------------------------------  Urinalysis    Component Value Date/Time   COLORURINE AMBER (A) 03/01/2018 2319   APPEARANCEUR HAZY (A) 03/01/2018 2319   APPEARANCEUR Clear 02/03/2018 0947   LABSPEC 1.027 03/01/2018 2319   PHURINE 5.0 03/01/2018 [place] 03/01/2018 2319   HGBUR NEGATIVE 03/01/2018 2319   BILIRUBINUR NEGATIVE 03/01/2018 2319   BILIRUBINUR Negative 02/03/2018 [place] 03/01/2018 2319   PROTEINUR 30 (A) 03/01/2018 2319   NITRITE NEGATIVE 03/01/2018 2319   LEUKOCYTESUR SMALL (A) 03/01/2018 2319   LEUKOCYTESUR Trace (A) 02/03/2018 0947     RADIOLOGY: Dg Chest 2 View  Result Date: 03/01/2018 CLINICAL DATA:  77 y/o  M; right rib pain after fall. EXAM: CHEST - 2 VIEW COMPARISON:  02/21/2018 chest radiograph FINDINGS: Stable heart size and mediastinal contours are within normal limits. Both lungs are clear. No acute osseous abnormality identified. IMPRESSION: 1. No active cardiopulmonary disease. 2. No acute osseous abnormality identified. Consider dedicated rib radiographs if there is focal tenderness. Electronically Signed   By: Kristine Garbe M.D.   On: 03/01/2018 22:33   Ct Chest Wo Contrast  Result Date: 03/02/2018 CLINICAL DATA:  Right  lateral chest pain following a fall. No history of smoking. EXAM: CT CHEST WITHOUT CONTRAST TECHNIQUE: Multidetector CT imaging of the chest was performed following the standard protocol without IV contrast. COMPARISON:  Chest radiographs obtained earlier this evening. Chest, abdomen and pelvis CT dated 01/09/2005. FINDINGS: Cardiovascular: Atheromatous calcifications, including the coronary arteries and aorta. Borderline enlarged heart. Mediastinum/Nodes: No enlarged lymph nodes. Enlarged lateral lobe scratch the enlarged left lobe of the thyroid gland containing small densely calcified nodules. Small to moderate-sized hiatal hernia. Lungs/Pleura: Interval small amount of linear scarring or subsegmental atelectasis in the left upper lobe. Interval 4 mm average diameter nodule in the superior segment of the right lower lobe on image number 65 series 3. Interval small to moderate-sized right pleural effusion with a medium density posterior component measuring 40 Hounsfield units in density and a lower density anterior component measuring 18 Hounsfield units in density. No pneumothorax. Upper Abdomen: Stable small cystic area in the lateral segment of the left lobe of the liver. Musculoskeletal: Thoracic and lower cervical spine degenerative changes. No fractures are seen. IMPRESSION: 1. Interval small to moderate-sized right pleural effusion with medium and lower density components. This could represent a hemothorax or effusion with proteinaceous fluid. 2. Interval 4 mm average diameter nodule in the superior segment of the right lower lobe. This is too small to characterize, but most likely benign in  the absence of known clinical risk factors for lung cancer. No follow-up needed if patient is low-risk. Non-contrast chest CT can be considered in 12 months if patient is high-risk. This recommendation follows the consensus statement: Guidelines for Management of Incidental Pulmonary Nodules Detected on CT Images: From  the Fleischner Society 2017; Radiology 2017; 284:228-243. 3. Interval small amount of linear scarring or subsegmental atelectasis in the left upper lobe. 4.  Calcific coronary artery and aortic atherosclerosis. 5. Small to moderate-sized hiatal hernia. 6. Multinodular goiter. Aortic Atherosclerosis (ICD10-I70.0). Electronically Signed   By: Claudie Revering M.D.   On: 03/02/2018 00:58    EKG: Orders placed or performed during the hospital encounter of 02/21/18  . EKG 12-Lead  . EKG 12-Lead  . ED EKG  . ED EKG  . EKG    IMPRESSION AND PLAN:  1.  Right-sided hemothorax, small to moderate sized, status post mechanical fall.  We will continue to monitor at this time with repeat chest x-ray.  We will hold 1 dose of Eliquis. Oxygen saturation is currently 92% on 2 L oxygen.  Patient's hypoxia might be rather related to poor inspiratory effort due to pain and drowsiness from pain medication. We will continue pain control and use incentive spirometer.  2.  Unstable gait, associated with drowsiness.  Per family, this has been noted in the past 2 to 3 weeks.  Will discontinue Wellbutrin and Requip which were started recently.  They do not seem to help and they could definitely cause side effects, nausea, poor balance and drowsiness.  We will go back to Prozac 40 mg daily for now, but if this does not help with depression, I would recommend switching to another agent, possibly Lexapro. Will have PT/OT evaluate and treat the patient.  3.  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  We will continue treatment with  Eliquis.  4.  Paroxysmal atrial fibrillation, currently in sinus rhythm, rate controlled.  Continue Eliquis for chronic anticoagulation.  5.  Hyperlipidemia, on statin.   All the records are reviewed and case discussed with ED provider. Management plans discussed with the patient, family and they are in agreement.  CODE STATUS: Full    Code Status Orders  (From admission,  onward)         Start     Ordered   03/02/18 0229  Full code  Continuous     03/02/18 0228        Code Status History    Date Active Date Inactive Code Status Order ID Comments User Context   02/22/2018 0409 02/22/2018 1548 Full Code [id number]  Harrie Foreman, MD Inpatient   10/24/2017 0235 10/25/2017 1939 Full Code [id number]  Arta Silence, MD Inpatient    Advance Directive Documentation     Most Recent Value  Type of Advance Directive  Healthcare Power of [place], Living will  Pre-existing out of facility DNR order (yellow form or pink MOST form)  -  "MOST" Form in Place?  -       TOTAL TIME TAKING CARE OF THIS PATIENT: 50 minutes.    Amelia Jo M.D on 03/02/2018 at 2:29 AM  Between 7am to 6pm - Pager - [phone]  After 6pm go to [email or web address] - password [email or web address] [facility] [place] [facility]  [phone]  CC: Primary care physician; Tracie Harrier, MD

## 2018-03-02 NOTE — Consult Note (Signed)
Pulmonary Medicine Consultation           Date: 03/02/2018,   MRN# [id number] Mark [name]. 25-Sep-1940 Code Status:     Code Status Orders  (From admission, onward)         Start     Ordered   03/02/18 0229  Full code  Continuous     03/02/18 0228        Code Status History    Date Active Date Inactive Code Status Order ID Comments User Context   02/22/2018 0409 02/22/2018 1548 Full Code [id number]  Harrie Foreman, MD Inpatient   10/24/2017 0235 10/25/2017 1939 Full Code [id number]  Arta Silence, MD Inpatient    Advance Directive Documentation     Most Recent Value  Type of Advance Directive  Healthcare Power of Attorney, Living will  Pre-existing out of facility DNR order (yellow form or pink MOST form)  -  "MOST" Form in Place?  -           AdmissionWeight: 99.8 kg                 CurrentWeight: 99.8 kg Mark [name]. is a 77 y.o. old male seen in consultation for hemothorax at the request of Dr Posey Pronto.     CHIEF COMPLAINT:    Fall on anticoagulation with right pleural effusion   HISTORY OF PRESENT ILLNESS    This is a 77 yo male with hx as outlined below.  He is taking Eliquis for PAF as well as coagulopathy with previous PE.  He sustained fall and has been hospitalized post incident.   He came in hypoxemic and has responded well to O2 therapy.  Currently he is stable in no distress but was found to have Right sided pleural effusion with increased density layering suggestive of possible hemothorax.  He did have an interval decrement in Hb but not to level that requires transfusion or frequent monitoring.  He denies fevers, chest pain, admits to mild SOB.  Currently is on RA   PAST MEDICAL HISTORY   Past Medical History:  Diagnosis Date  . Anxiety   . Atrial fibrillation ([place])   . Back pain, chronic   . Colon polyp   . Cyst of left kidney   . GERD (gastroesophageal reflux disease)   . Heart murmur   . Hyperlipemia   . Mitral valve  prolapse   . Neuromuscular disorder ([place])   . Neuropathy   . Osteoarthritis   . Pulmonary emboli ([place])   . Spinal stenosis   . Thyroid nodule   . Vertigo      SURGICAL HISTORY   Past Surgical History:  Procedure Laterality Date  . APPENDECTOMY    . BACK SURGERY    . COLONOSCOPY N/A 08/15/2014   Procedure: COLONOSCOPY;  Surgeon: [name], MD;  Location: [facility] ENDOSCOPY;  Service: Endoscopy;  Laterality: N/A;  . ESOPHAGOGASTRODUODENOSCOPY N/A 08/15/2014   Procedure: ESOPHAGOGASTRODUODENOSCOPY (EGD);  Surgeon: [name], MD;  Location: [facility] ENDOSCOPY;  Service: Endoscopy;  Laterality: N/A;  . TONSILLECTOMY       FAMILY HISTORY   No family history on file.   SOCIAL HISTORY   Social History   Tobacco Use  . Smoking status: Never Smoker  . Smokeless tobacco: Never Used  Substance Use Topics  . Alcohol use: Yes    Alcohol/week: 1.0 standard drinks    Types: 1 Glasses of wine per week  . Drug  use: Not on file     MEDICATIONS    Home Medication:  As per [facility]   Current Medication:  Current Facility-Administered Medications:  .  acetaminophen (TYLENOL) tablet 650 mg, 650 mg, Oral, Q6H PRN **OR** acetaminophen (TYLENOL) suppository 650 mg, 650 mg, Rectal, Q6H PRN, Amelia Jo, MD .  apixaban Arne [place]) tablet 2.5 mg, 2.5 mg, Oral, BID, Fritzi Mandes, MD, 2.5 mg at 03/02/18 1103 .  bisacodyl (DULCOLAX) EC tablet 5 mg, 5 mg, Oral, Daily PRN, Amelia Jo, MD .  docusate sodium (COLACE) capsule 100 mg, 100 mg, Oral, BID, Amelia Jo, MD, 100 mg at 03/02/18 0916 .  feeding supplement (ENSURE ENLIVE) (ENSURE ENLIVE) liquid 237 mL, 237 mL, Oral, BID BM, Fritzi Mandes, MD .  finasteride (PROSCAR) tablet 5 mg, 5 mg, Oral, Daily, Amelia Jo, MD, 5 mg at 03/02/18 0916 .  FLUoxetine (PROZAC) capsule 40 mg, 40 mg, Oral, Daily, Amelia Jo, MD, 40 mg at 03/02/18 1103 .  HYDROcodone-acetaminophen (NORCO/VICODIN) 5-325 MG per tablet 1-2 tablet, 1-2 tablet, Oral, Q4H  PRN, Amelia Jo, MD, 1 tablet at 03/02/18 [phone] .  multivitamin with minerals tablet 1 tablet, 1 tablet, Oral, Daily, Fritzi Mandes, MD .  ondansetron (ZOFRAN) tablet 4 mg, 4 mg, Oral, Q6H PRN **OR** ondansetron (ZOFRAN) injection 4 mg, 4 mg, Intravenous, Q6H PRN, Amelia Jo, MD .  pantoprazole (PROTONIX) EC tablet 40 mg, 40 mg, Oral, Daily, Amelia Jo, MD, 40 mg at 03/02/18 0916 .  simvastatin (ZOCOR) tablet 40 mg, 40 mg, Oral, QHS, Amelia Jo, MD .  tamsulosin [facility]) capsule 0.4 mg, 0.4 mg, Oral, Daily, Amelia Jo, MD, 0.4 mg at 03/02/18 0916 .  traZODone (DESYREL) tablet 25 mg, 25 mg, Oral, QHS PRN, Amelia Jo, MD .  vitamin B-12 (CYANOCOBALAMIN) tablet 5,000 mcg, 5,000 mcg, Oral, Daily, Amelia Jo, MD, 5,000 mcg at 03/02/18 0916    ALLERGIES   Erythromycin; Keflex [cephalexin]; and Penicillins     REVIEW OF SYSTEMS    Review of Systems:  Gen:  Denies  fever, sweats, chills weigh loss  HEENT: Denies blurred vision, double vision, ear pain, eye pain, hearing loss, nose bleeds, sore throat Cardiac:  No dizziness, chest pain or heaviness, chest tightness,edema Resp:   Denies cough or sputum porduction, shortness of breath,wheezing, hemoptysis,  Gi: Denies swallowing difficulty, stomach pain, nausea or vomiting, diarrhea, constipation, bowel incontinence Gu:  Denies bladder incontinence, burning urine Ext:   Denies Joint pain, stiffness or swelling Skin: Denies  skin rash, easy bruising or bleeding or hives Endoc:  Denies polyuria, polydipsia , polyphagia or weight change Psych:   Denies depression, insomnia or hallucinations   Other:  All other systems negative   VS: BP 125/66 (BP Location: Left Arm)   Pulse 75   Temp 97.8 F (36.6 C) (Oral)   Resp 16   Ht 6\' 4"  (1.93 m)   Wt 99.8 kg   SpO2 92%   BMI 26.78 kg/m      PHYSICAL EXAM  Physical Examination:   GENERAL:NAD, no fevers, chills, no weakness no fatigue HEAD: Normocephalic, atraumatic.    EYES: Pupils equal, round, reactive to light. Extraocular muscles intact. No scleral icterus.  MOUTH: Moist mucosal membrane. Dentition intact. No abscess noted.  EAR, NOSE, THROAT: Clear without exudates. No external lesions.  NECK: Supple. No thyromegaly. No nodules. No JVD.  PULMONARY: decreased breath sound bilaterally worse on right no wheezing no rhonchi CARDIOVASCULAR: S1 and S2. Regular rate and rhythm. No murmurs, rubs, or gallops. No edema.  Pedal pulses 2+ bilaterally.  GASTROINTESTINAL: Soft, nontender, nondistended. No masses. Positive bowel sounds. No hepatosplenomegaly.  MUSCULOSKELETAL: No swelling, clubbing, or edema. Range of motion full in all extremities.  NEUROLOGIC: Cranial nerves II through XII are intact. No gross focal neurological deficits. Sensation intact. Reflexes intact.  SKIN: No ulceration, lesions, rashes, or cyanosis. Skin warm and dry. Turgor intact.  PSYCHIATRIC: Mood, affect within normal limits. The patient is awake, alert and oriented x 3. Insight, judgment intact.  ALL OTHER ROS ARE NEGATIVE     IMAGING    Dg Chest 2 View  Result Date: 03/01/2018 CLINICAL DATA:  77 y/o  M; right rib pain after fall. EXAM: CHEST - 2 VIEW COMPARISON:  02/21/2018 chest radiograph FINDINGS: Stable heart size and mediastinal contours are within normal limits. Both lungs are clear. No acute osseous abnormality identified. IMPRESSION: 1. No active cardiopulmonary disease. 2. No acute osseous abnormality identified. Consider dedicated rib radiographs if there is focal tenderness. Electronically Signed   By: Kristine Garbe M.D.   On: 03/01/2018 22:33   Ct Chest Wo Contrast  Result Date: 03/02/2018 CLINICAL DATA:  Right lateral chest pain following a fall. No history of smoking. EXAM: CT CHEST WITHOUT CONTRAST TECHNIQUE: Multidetector CT imaging of the chest was performed following the standard protocol without IV contrast. COMPARISON:  Chest radiographs obtained  earlier this evening. Chest, abdomen and pelvis CT dated 01/09/2005. FINDINGS: Cardiovascular: Atheromatous calcifications, including the coronary arteries and aorta. Borderline enlarged heart. Mediastinum/Nodes: No enlarged lymph nodes. Enlarged lateral lobe scratch the enlarged left lobe of the thyroid gland containing small densely calcified nodules. Small to moderate-sized hiatal hernia. Lungs/Pleura: Interval small amount of linear scarring or subsegmental atelectasis in the left upper lobe. Interval 4 mm average diameter nodule in the superior segment of the right lower lobe on image number 65 series 3. Interval small to moderate-sized right pleural effusion with a medium density posterior component measuring 40 Hounsfield units in density and a lower density anterior component measuring 18 Hounsfield units in density. No pneumothorax. Upper Abdomen: Stable small cystic area in the lateral segment of the left lobe of the liver. Musculoskeletal: Thoracic and lower cervical spine degenerative changes. No fractures are seen. IMPRESSION: 1. Interval small to moderate-sized right pleural effusion with medium and lower density components. This could represent a hemothorax or effusion with proteinaceous fluid. 2. Interval 4 mm average diameter nodule in the superior segment of the right lower lobe. This is too small to characterize, but most likely benign in the absence of known clinical risk factors for lung cancer. No follow-up needed if patient is low-risk. Non-contrast chest CT can be considered in 12 months if patient is high-risk. This recommendation follows the consensus statement: Guidelines for Management of Incidental Pulmonary Nodules Detected on CT Images: From the Fleischner Society 2017; Radiology 2017; 284:228-243. 3. Interval small amount of linear scarring or subsegmental atelectasis in the left upper lobe. 4.  Calcific coronary artery and aortic atherosclerosis. 5. Small to moderate-sized hiatal  hernia. 6. Multinodular goiter. Aortic Atherosclerosis (ICD10-I70.0). Electronically Signed   By: Claudie Revering M.D.   On: 03/02/2018 00:58   Mr Brain Wo Contrast  Result Date: 02/21/2018 CLINICAL DATA:  Nausea, vomiting and weakness. Recent treated urinary tract infection. History of atrial fibrillation, hyperlipidemia, spinal stenosis, spine surgery. EXAM: MRI CERVICAL, THORACIC AND LUMBAR SPINE WITHOUT CONTRAST MRI HEAD WITHOUT CONTRAST TECHNIQUE: Multiplanar and multiecho pulse sequences of the cervical spine, to include the craniocervical junction and cervicothoracic junction, and thoracic  and lumbar spine, were obtained without intravenous contrast. Multiplanar, multiecho pulse sequences of the brain and surrounding structures were obtained without intravenous contrast. COMPARISON:  None. MRI head November 21, 2017 and MRI thoracic spine November 12, 2016 and MRI lumbar spine April 21, 2016. FINDINGS: MRI HEAD FINDINGS INTRACRANIAL CONTENTS: No reduced diffusion to suggest acute ischemia or typical infection. No susceptibility artifact to suggest hemorrhage. The ventricles and sulci are normal for patient's age. Patchy to confluent supratentorial white matter FLAIR T2 hyperintensities. No suspicious parenchymal signal, masses, mass effect. No abnormal extra-axial fluid collections. No extra-axial masses. VASCULAR: Normal major intracranial vascular flow voids present at skull base. SKULL AND UPPER CERVICAL SPINE: No abnormal sellar expansion. No suspicious calvarial bone marrow signal. Craniocervical junction maintained. SINUSES/ORBITS: The mastoid air-cells and included paranasal sinuses are well-aerated.The included ocular globes and orbital contents are non-suspicious. OTHER: None. MRI CERVICAL SPINE FINDINGS-moderately motion degraded sagittal sequences, axial sequences are moderate to severely motion degraded. ALIGNMENT: Straightened cervical lordosis. Grade 1 C3-4 anterolisthesis and C7-T1  anterolisthesis. VERTEBRAE/DISCS: Vertebral bodies are intact. Moderate C3-4 disc height loss with RIGHT T1, bright T2 signal within the disc, no STIR signal abnormality contiguous with LEFT facet, most compatible mineralization. Severe C5-6, C6-7 and moderate C7-T1 disc height loss, and disc desiccation compatible with degenerative discs. Moderate C5-6 and C6-7 subacute discogenic endplate changes. CORD: Linear bright STIR signal at C3-4 not localized on remaining sequences, potentially due to motion. POSTERIOR FOSSA, VERTEBRAL ARTERIES, PARASPINAL TISSUES: No MR findings of ligamentous injury. Vertebral artery flow voids present. Included posterior fossa and paraspinal soft tissues are normal. DISC LEVELS: Limited assessment due to motion. C2-3: Annular bulging, uncovertebral hypertrophy and severe RIGHT, moderate LEFT facet arthropathy. No canal stenosis or neural foraminal narrowing. C3-4: Anterolisthesis. Small broad-based disc bulge, uncovertebral hypertrophy in severe LEFT, mild RIGHT facet arthropathy. No canal stenosis. Moderate RIGHT and severe suspected LEFT neural foraminal narrowing. C4-5: Small broad-based disc osteophyte and vertebral hypertrophy and severe LEFT and moderate RIGHT facet arthropathy. No canal stenosis. Moderate suspected LEFT neural foraminal narrowing. C5-6: Small broad-based disc bulge, uncovertebral hypertrophy and moderate LEFT, mild RIGHT facet arthropathy. No canal stenosis. Moderate suspected bilateral neural foraminal narrowing. C6-7: Small broad-based disc bulge, uncovertebral hypertrophy and mild facet arthropathy. No canal stenosis. Severe suspected neural narrowing. C7-T1: Anterolisthesis. Annular bulging and severe RIGHT moderate LEFT facet arthropathy. No canal. Mild moderate RIGHT neural foraminal narrowing. MRI THORACIC SPINE FINDINGS-moderately motion degraded examination. ALIGNMENT: Maintenance of the thoracic kyphosis. No malalignment. VERTEBRAE/DISCS: Vertebral  bodies are intact. Advanced upper thoracic disc height loss with bridging bone marrow signal compatible with arthrodesis. Multilevel moderate to severe disc height loss with disc desiccation compatible with degenerative discs. T6 acute discogenic endplate changes versus Schmorl's node. No suspicious bone marrow signal. CORD: Limited assessment spinal cord due to motion, no syrinx or definite myelomalacia. PREVERTEBRAL AND PARASPINAL SOFT TISSUES:  Non suspicious. DISC LEVELS: Small T4-5 central disc protrusion. No canal stenosis. Moderate RIGHT T1-2 neural foraminal narrowing. MRI LUMBAR SPINE FINDINGS-moderately motion degraded axial sequences. SEGMENTATION: For the purposes of this report, the last well-formed intervertebral disc is reported as L5-S1. Transitional anatomy with partially sacralized L5 vertebral body, consistent with prior MRI. ALIGNMENT: Maintained lumbar lordosis. Minimal stable grade 1 L4-5 anterolisthesis and grade 1 L5-S1 anterolisthesis. VERTEBRAE:Vertebral bodies are intact. Similar moderate L2-3 disc height loss, mild at L4-5. Diffuse disc desiccation multilevel mild chronic discogenic endplate changes. L4 inferior endplate acute Schmorl's node. Focal fat versus hemangioma RIGHT L5 pedicle. No suspicious bone marrow  signal. CONUS MEDULLARIS AND CAUDA EQUINA: Conus medullaris terminates at L1 and demonstrates normal morphology and signal characteristics. Limited assessment of cauda equina due to motion. PARASPINAL AND OTHER SOFT TISSUES: Non suspicious. Severe paraspinal muscle atrophy and below the level surgical intervention. DISC LEVELS: L1-2: No disc bulge, canal stenosis nor neural foraminal narrowing. Mild facet arthropathy. L2-3: Small broad-based disc bulge. Moderate facet arthropathy and ligamentum flavum redundancy. No canal stenosis. Moderate RIGHT and mild LEFT neural foraminal narrowing. L3-4: Annular bulging. Severe facet arthropathy and ligamentum flavum redundancy. Mild canal  stenosis. No neural foraminal narrowing. L4-5: Anterolisthesis. Annular bulging. Moderate to severe facet arthropathy with trace facet effusions. 9 mm RIGHT facet synovial cyst within paraspinal soft tissues. Moderate canal stenosis. Mild bilateral neural foraminal narrowing. L5-S1: Posterior decompression. Anterolisthesis. Severe facet arthropathy without canal stenosis. Mild-to-moderate bilateral neural foraminal narrowing. IMPRESSION: MRI head: 1. No acute intracranial process. 2. Stable moderate chronic small vessel ischemic changes. MRI cervical spine: 1. Motion degraded examination. 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 2. Grade 1 C3-4 and C7-T1 anterolisthesis. No acute osseous process. 3. No canal stenosis. Neural foraminal narrowing C3-4 through C7-T1: Likely severe at C3-4 and C6-7. Given degree of motion, CT cervical spine may be of added value. MRI thoracic spine: 1. Motion degraded examination.  No fracture or malalignment. 2. Stable degenerative change of the thoracic spine. No canal. Moderate RIGHT T1-2 neural foraminal narrowing. MRI lumbar spine: 1. Motion degraded examination. 2. Stable grade 1 L4-5 and L5-S1 anterolisthesis. Status post L5 laminectomies. 3. 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 4. Stable neural foraminal narrowing L2-3, L4-5 and L5-S1: Moderate on the RIGHT at L2-3. Electronically Signed   By: Elon Alas M.D.   On: 02/21/2018 23:24   Mr Cervical Spine Wo Contrast  Result Date: 02/21/2018 CLINICAL DATA:  Nausea, vomiting and weakness. Recent treated urinary tract infection. History of atrial fibrillation, hyperlipidemia, spinal stenosis, spine surgery. EXAM: MRI CERVICAL, THORACIC AND LUMBAR SPINE WITHOUT CONTRAST MRI HEAD WITHOUT CONTRAST TECHNIQUE: Multiplanar and multiecho pulse sequences of the cervical spine, to include the craniocervical junction and cervicothoracic junction, and thoracic and lumbar spine, were obtained without intravenous  contrast. Multiplanar, multiecho pulse sequences of the brain and surrounding structures were obtained without intravenous contrast. COMPARISON:  None. MRI head November 21, 2017 and MRI thoracic spine November 12, 2016 and MRI lumbar spine April 21, 2016. FINDINGS: MRI HEAD FINDINGS INTRACRANIAL CONTENTS: No reduced diffusion to suggest acute ischemia or typical infection. No susceptibility artifact to suggest hemorrhage. The ventricles and sulci are normal for patient's age. Patchy to confluent supratentorial white matter FLAIR T2 hyperintensities. No suspicious parenchymal signal, masses, mass effect. No abnormal extra-axial fluid collections. No extra-axial masses. VASCULAR: Normal major intracranial vascular flow voids present at skull base. SKULL AND UPPER CERVICAL SPINE: No abnormal sellar expansion. No suspicious calvarial bone marrow signal. Craniocervical junction maintained. SINUSES/ORBITS: The mastoid air-cells and included paranasal sinuses are well-aerated.The included ocular globes and orbital contents are non-suspicious. OTHER: None. MRI CERVICAL SPINE FINDINGS-moderately motion degraded sagittal sequences, axial sequences are moderate to severely motion degraded. ALIGNMENT: Straightened cervical lordosis. Grade 1 C3-4 anterolisthesis and C7-T1 anterolisthesis. VERTEBRAE/DISCS: Vertebral bodies are intact. Moderate C3-4 disc height loss with RIGHT T1, bright T2 signal within the disc, no STIR signal abnormality contiguous with LEFT facet, most compatible mineralization. Severe C5-6, C6-7 and moderate C7-T1 disc height loss, and disc desiccation compatible with degenerative discs. Moderate C5-6 and C6-7 subacute discogenic endplate changes. CORD: Linear bright STIR signal at  C3-4 not localized on remaining sequences, potentially due to motion. POSTERIOR FOSSA, VERTEBRAL ARTERIES, PARASPINAL TISSUES: No MR findings of ligamentous injury. Vertebral artery flow voids present. Included posterior fossa and  paraspinal soft tissues are normal. DISC LEVELS: Limited assessment due to motion. C2-3: Annular bulging, uncovertebral hypertrophy and severe RIGHT, moderate LEFT facet arthropathy. No canal stenosis or neural foraminal narrowing. C3-4: Anterolisthesis. Small broad-based disc bulge, uncovertebral hypertrophy in severe LEFT, mild RIGHT facet arthropathy. No canal stenosis. Moderate RIGHT and severe suspected LEFT neural foraminal narrowing. C4-5: Small broad-based disc osteophyte and vertebral hypertrophy and severe LEFT and moderate RIGHT facet arthropathy. No canal stenosis. Moderate suspected LEFT neural foraminal narrowing. C5-6: Small broad-based disc bulge, uncovertebral hypertrophy and moderate LEFT, mild RIGHT facet arthropathy. No canal stenosis. Moderate suspected bilateral neural foraminal narrowing. C6-7: Small broad-based disc bulge, uncovertebral hypertrophy and mild facet arthropathy. No canal stenosis. Severe suspected neural narrowing. C7-T1: Anterolisthesis. Annular bulging and severe RIGHT moderate LEFT facet arthropathy. No canal. Mild moderate RIGHT neural foraminal narrowing. MRI THORACIC SPINE FINDINGS-moderately motion degraded examination. ALIGNMENT: Maintenance of the thoracic kyphosis. No malalignment. VERTEBRAE/DISCS: Vertebral bodies are intact. Advanced upper thoracic disc height loss with bridging bone marrow signal compatible with arthrodesis. Multilevel moderate to severe disc height loss with disc desiccation compatible with degenerative discs. T6 acute discogenic endplate changes versus Schmorl's node. No suspicious bone marrow signal. CORD: Limited assessment spinal cord due to motion, no syrinx or definite myelomalacia. PREVERTEBRAL AND PARASPINAL SOFT TISSUES:  Non suspicious. DISC LEVELS: Small T4-5 central disc protrusion. No canal stenosis. Moderate RIGHT T1-2 neural foraminal narrowing. MRI LUMBAR SPINE FINDINGS-moderately motion degraded axial sequences. SEGMENTATION: For  the purposes of this report, the last well-formed intervertebral disc is reported as L5-S1. Transitional anatomy with partially sacralized L5 vertebral body, consistent with prior MRI. ALIGNMENT: Maintained lumbar lordosis. Minimal stable grade 1 L4-5 anterolisthesis and grade 1 L5-S1 anterolisthesis. VERTEBRAE:Vertebral bodies are intact. Similar moderate L2-3 disc height loss, mild at L4-5. Diffuse disc desiccation multilevel mild chronic discogenic endplate changes. L4 inferior endplate acute Schmorl's node. Focal fat versus hemangioma RIGHT L5 pedicle. No suspicious bone marrow signal. CONUS MEDULLARIS AND CAUDA EQUINA: Conus medullaris terminates at L1 and demonstrates normal morphology and signal characteristics. Limited assessment of cauda equina due to motion. PARASPINAL AND OTHER SOFT TISSUES: Non suspicious. Severe paraspinal muscle atrophy and below the level surgical intervention. DISC LEVELS: L1-2: No disc bulge, canal stenosis nor neural foraminal narrowing. Mild facet arthropathy. L2-3: Small broad-based disc bulge. Moderate facet arthropathy and ligamentum flavum redundancy. No canal stenosis. Moderate RIGHT and mild LEFT neural foraminal narrowing. L3-4: Annular bulging. Severe facet arthropathy and ligamentum flavum redundancy. Mild canal stenosis. No neural foraminal narrowing. L4-5: Anterolisthesis. Annular bulging. Moderate to severe facet arthropathy with trace facet effusions. 9 mm RIGHT facet synovial cyst within paraspinal soft tissues. Moderate canal stenosis. Mild bilateral neural foraminal narrowing. L5-S1: Posterior decompression. Anterolisthesis. Severe facet arthropathy without canal stenosis. Mild-to-moderate bilateral neural foraminal narrowing. IMPRESSION: MRI head: 1. No acute intracranial process. 2. Stable moderate chronic small vessel ischemic changes. MRI cervical spine: 1. Motion degraded examination. 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 2. Grade 1 C3-4 and C7-T1  anterolisthesis. No acute osseous process. 3. No canal stenosis. Neural foraminal narrowing C3-4 through C7-T1: Likely severe at C3-4 and C6-7. Given degree of motion, CT cervical spine may be of added value. MRI thoracic spine: 1. Motion degraded examination.  No fracture or malalignment. 2. Stable degenerative change of the thoracic spine. No canal. Moderate  RIGHT T1-2 neural foraminal narrowing. MRI lumbar spine: 1. Motion degraded examination. 2. Stable grade 1 L4-5 and L5-S1 anterolisthesis. Status post L5 laminectomies. 3. 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 4. Stable neural foraminal narrowing L2-3, L4-5 and L5-S1: Moderate on the RIGHT at L2-3. Electronically Signed   By: Elon Alas M.D.   On: 02/21/2018 23:24   Mr Thoracic Spine Wo Contrast  Result Date: 02/21/2018 CLINICAL DATA:  Nausea, vomiting and weakness. Recent treated urinary tract infection. History of atrial fibrillation, hyperlipidemia, spinal stenosis, spine surgery. EXAM: MRI CERVICAL, THORACIC AND LUMBAR SPINE WITHOUT CONTRAST MRI HEAD WITHOUT CONTRAST TECHNIQUE: Multiplanar and multiecho pulse sequences of the cervical spine, to include the craniocervical junction and cervicothoracic junction, and thoracic and lumbar spine, were obtained without intravenous contrast. Multiplanar, multiecho pulse sequences of the brain and surrounding structures were obtained without intravenous contrast. COMPARISON:  None. MRI head November 21, 2017 and MRI thoracic spine November 12, 2016 and MRI lumbar spine April 21, 2016. FINDINGS: MRI HEAD FINDINGS INTRACRANIAL CONTENTS: No reduced diffusion to suggest acute ischemia or typical infection. No susceptibility artifact to suggest hemorrhage. The ventricles and sulci are normal for patient's age. Patchy to confluent supratentorial white matter FLAIR T2 hyperintensities. No suspicious parenchymal signal, masses, mass effect. No abnormal extra-axial fluid collections. No extra-axial  masses. VASCULAR: Normal major intracranial vascular flow voids present at skull base. SKULL AND UPPER CERVICAL SPINE: No abnormal sellar expansion. No suspicious calvarial bone marrow signal. Craniocervical junction maintained. SINUSES/ORBITS: The mastoid air-cells and included paranasal sinuses are well-aerated.The included ocular globes and orbital contents are non-suspicious. OTHER: None. MRI CERVICAL SPINE FINDINGS-moderately motion degraded sagittal sequences, axial sequences are moderate to severely motion degraded. ALIGNMENT: Straightened cervical lordosis. Grade 1 C3-4 anterolisthesis and C7-T1 anterolisthesis. VERTEBRAE/DISCS: Vertebral bodies are intact. Moderate C3-4 disc height loss with RIGHT T1, bright T2 signal within the disc, no STIR signal abnormality contiguous with LEFT facet, most compatible mineralization. Severe C5-6, C6-7 and moderate C7-T1 disc height loss, and disc desiccation compatible with degenerative discs. Moderate C5-6 and C6-7 subacute discogenic endplate changes. CORD: Linear bright STIR signal at C3-4 not localized on remaining sequences, potentially due to motion. POSTERIOR FOSSA, VERTEBRAL ARTERIES, PARASPINAL TISSUES: No MR findings of ligamentous injury. Vertebral artery flow voids present. Included posterior fossa and paraspinal soft tissues are normal. DISC LEVELS: Limited assessment due to motion. C2-3: Annular bulging, uncovertebral hypertrophy and severe RIGHT, moderate LEFT facet arthropathy. No canal stenosis or neural foraminal narrowing. C3-4: Anterolisthesis. Small broad-based disc bulge, uncovertebral hypertrophy in severe LEFT, mild RIGHT facet arthropathy. No canal stenosis. Moderate RIGHT and severe suspected LEFT neural foraminal narrowing. C4-5: Small broad-based disc osteophyte and vertebral hypertrophy and severe LEFT and moderate RIGHT facet arthropathy. No canal stenosis. Moderate suspected LEFT neural foraminal narrowing. C5-6: Small broad-based disc  bulge, uncovertebral hypertrophy and moderate LEFT, mild RIGHT facet arthropathy. No canal stenosis. Moderate suspected bilateral neural foraminal narrowing. C6-7: Small broad-based disc bulge, uncovertebral hypertrophy and mild facet arthropathy. No canal stenosis. Severe suspected neural narrowing. C7-T1: Anterolisthesis. Annular bulging and severe RIGHT moderate LEFT facet arthropathy. No canal. Mild moderate RIGHT neural foraminal narrowing. MRI THORACIC SPINE FINDINGS-moderately motion degraded examination. ALIGNMENT: Maintenance of the thoracic kyphosis. No malalignment. VERTEBRAE/DISCS: Vertebral bodies are intact. Advanced upper thoracic disc height loss with bridging bone marrow signal compatible with arthrodesis. Multilevel moderate to severe disc height loss with disc desiccation compatible with degenerative discs. T6 acute discogenic endplate changes versus Schmorl's node. No suspicious bone marrow signal.  CORD: Limited assessment spinal cord due to motion, no syrinx or definite myelomalacia. PREVERTEBRAL AND PARASPINAL SOFT TISSUES:  Non suspicious. DISC LEVELS: Small T4-5 central disc protrusion. No canal stenosis. Moderate RIGHT T1-2 neural foraminal narrowing. MRI LUMBAR SPINE FINDINGS-moderately motion degraded axial sequences. SEGMENTATION: For the purposes of this report, the last well-formed intervertebral disc is reported as L5-S1. Transitional anatomy with partially sacralized L5 vertebral body, consistent with prior MRI. ALIGNMENT: Maintained lumbar lordosis. Minimal stable grade 1 L4-5 anterolisthesis and grade 1 L5-S1 anterolisthesis. VERTEBRAE:Vertebral bodies are intact. Similar moderate L2-3 disc height loss, mild at L4-5. Diffuse disc desiccation multilevel mild chronic discogenic endplate changes. L4 inferior endplate acute Schmorl's node. Focal fat versus hemangioma RIGHT L5 pedicle. No suspicious bone marrow signal. CONUS MEDULLARIS AND CAUDA EQUINA: Conus medullaris terminates at L1  and demonstrates normal morphology and signal characteristics. Limited assessment of cauda equina due to motion. PARASPINAL AND OTHER SOFT TISSUES: Non suspicious. Severe paraspinal muscle atrophy and below the level surgical intervention. DISC LEVELS: L1-2: No disc bulge, canal stenosis nor neural foraminal narrowing. Mild facet arthropathy. L2-3: Small broad-based disc bulge. Moderate facet arthropathy and ligamentum flavum redundancy. No canal stenosis. Moderate RIGHT and mild LEFT neural foraminal narrowing. L3-4: Annular bulging. Severe facet arthropathy and ligamentum flavum redundancy. Mild canal stenosis. No neural foraminal narrowing. L4-5: Anterolisthesis. Annular bulging. Moderate to severe facet arthropathy with trace facet effusions. 9 mm RIGHT facet synovial cyst within paraspinal soft tissues. Moderate canal stenosis. Mild bilateral neural foraminal narrowing. L5-S1: Posterior decompression. Anterolisthesis. Severe facet arthropathy without canal stenosis. Mild-to-moderate bilateral neural foraminal narrowing. IMPRESSION: MRI head: 1. No acute intracranial process. 2. Stable moderate chronic small vessel ischemic changes. MRI cervical spine: 1. Motion degraded examination. 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 2. Grade 1 C3-4 and C7-T1 anterolisthesis. No acute osseous process. 3. No canal stenosis. Neural foraminal narrowing C3-4 through C7-T1: Likely severe at C3-4 and C6-7. Given degree of motion, CT cervical spine may be of added value. MRI thoracic spine: 1. Motion degraded examination.  No fracture or malalignment. 2. Stable degenerative change of the thoracic spine. No canal. Moderate RIGHT T1-2 neural foraminal narrowing. MRI lumbar spine: 1. Motion degraded examination. 2. Stable grade 1 L4-5 and L5-S1 anterolisthesis. Status post L5 laminectomies. 3. 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 4. Stable neural foraminal narrowing L2-3, L4-5 and L5-S1: Moderate on the RIGHT at  L2-3. Electronically Signed   By: Elon Alas M.D.   On: 02/21/2018 23:24   Mr Lumbar Spine Wo Contrast  Result Date: 02/21/2018 CLINICAL DATA:  Nausea, vomiting and weakness. Recent treated urinary tract infection. History of atrial fibrillation, hyperlipidemia, spinal stenosis, spine surgery. EXAM: MRI CERVICAL, THORACIC AND LUMBAR SPINE WITHOUT CONTRAST MRI HEAD WITHOUT CONTRAST TECHNIQUE: Multiplanar and multiecho pulse sequences of the cervical spine, to include the craniocervical junction and cervicothoracic junction, and thoracic and lumbar spine, were obtained without intravenous contrast. Multiplanar, multiecho pulse sequences of the brain and surrounding structures were obtained without intravenous contrast. COMPARISON:  None. MRI head November 21, 2017 and MRI thoracic spine November 12, 2016 and MRI lumbar spine April 21, 2016. FINDINGS: MRI HEAD FINDINGS INTRACRANIAL CONTENTS: No reduced diffusion to suggest acute ischemia or typical infection. No susceptibility artifact to suggest hemorrhage. The ventricles and sulci are normal for patient's age. Patchy to confluent supratentorial white matter FLAIR T2 hyperintensities. No suspicious parenchymal signal, masses, mass effect. No abnormal extra-axial fluid collections. No extra-axial masses. VASCULAR: Normal major intracranial vascular flow voids present at  skull base. SKULL AND UPPER CERVICAL SPINE: No abnormal sellar expansion. No suspicious calvarial bone marrow signal. Craniocervical junction maintained. SINUSES/ORBITS: The mastoid air-cells and included paranasal sinuses are well-aerated.The included ocular globes and orbital contents are non-suspicious. OTHER: None. MRI CERVICAL SPINE FINDINGS-moderately motion degraded sagittal sequences, axial sequences are moderate to severely motion degraded. ALIGNMENT: Straightened cervical lordosis. Grade 1 C3-4 anterolisthesis and C7-T1 anterolisthesis. VERTEBRAE/DISCS: Vertebral bodies are intact.  Moderate C3-4 disc height loss with RIGHT T1, bright T2 signal within the disc, no STIR signal abnormality contiguous with LEFT facet, most compatible mineralization. Severe C5-6, C6-7 and moderate C7-T1 disc height loss, and disc desiccation compatible with degenerative discs. Moderate C5-6 and C6-7 subacute discogenic endplate changes. CORD: Linear bright STIR signal at C3-4 not localized on remaining sequences, potentially due to motion. POSTERIOR FOSSA, VERTEBRAL ARTERIES, PARASPINAL TISSUES: No MR findings of ligamentous injury. Vertebral artery flow voids present. Included posterior fossa and paraspinal soft tissues are normal. DISC LEVELS: Limited assessment due to motion. C2-3: Annular bulging, uncovertebral hypertrophy and severe RIGHT, moderate LEFT facet arthropathy. No canal stenosis or neural foraminal narrowing. C3-4: Anterolisthesis. Small broad-based disc bulge, uncovertebral hypertrophy in severe LEFT, mild RIGHT facet arthropathy. No canal stenosis. Moderate RIGHT and severe suspected LEFT neural foraminal narrowing. C4-5: Small broad-based disc osteophyte and vertebral hypertrophy and severe LEFT and moderate RIGHT facet arthropathy. No canal stenosis. Moderate suspected LEFT neural foraminal narrowing. C5-6: Small broad-based disc bulge, uncovertebral hypertrophy and moderate LEFT, mild RIGHT facet arthropathy. No canal stenosis. Moderate suspected bilateral neural foraminal narrowing. C6-7: Small broad-based disc bulge, uncovertebral hypertrophy and mild facet arthropathy. No canal stenosis. Severe suspected neural narrowing. C7-T1: Anterolisthesis. Annular bulging and severe RIGHT moderate LEFT facet arthropathy. No canal. Mild moderate RIGHT neural foraminal narrowing. MRI THORACIC SPINE FINDINGS-moderately motion degraded examination. ALIGNMENT: Maintenance of the thoracic kyphosis. No malalignment. VERTEBRAE/DISCS: Vertebral bodies are intact. Advanced upper thoracic disc height loss with  bridging bone marrow signal compatible with arthrodesis. Multilevel moderate to severe disc height loss with disc desiccation compatible with degenerative discs. T6 acute discogenic endplate changes versus Schmorl's node. No suspicious bone marrow signal. CORD: Limited assessment spinal cord due to motion, no syrinx or definite myelomalacia. PREVERTEBRAL AND PARASPINAL SOFT TISSUES:  Non suspicious. DISC LEVELS: Small T4-5 central disc protrusion. No canal stenosis. Moderate RIGHT T1-2 neural foraminal narrowing. MRI LUMBAR SPINE FINDINGS-moderately motion degraded axial sequences. SEGMENTATION: For the purposes of this report, the last well-formed intervertebral disc is reported as L5-S1. Transitional anatomy with partially sacralized L5 vertebral body, consistent with prior MRI. ALIGNMENT: Maintained lumbar lordosis. Minimal stable grade 1 L4-5 anterolisthesis and grade 1 L5-S1 anterolisthesis. VERTEBRAE:Vertebral bodies are intact. Similar moderate L2-3 disc height loss, mild at L4-5. Diffuse disc desiccation multilevel mild chronic discogenic endplate changes. L4 inferior endplate acute Schmorl's node. Focal fat versus hemangioma RIGHT L5 pedicle. No suspicious bone marrow signal. CONUS MEDULLARIS AND CAUDA EQUINA: Conus medullaris terminates at L1 and demonstrates normal morphology and signal characteristics. Limited assessment of cauda equina due to motion. PARASPINAL AND OTHER SOFT TISSUES: Non suspicious. Severe paraspinal muscle atrophy and below the level surgical intervention. DISC LEVELS: L1-2: No disc bulge, canal stenosis nor neural foraminal narrowing. Mild facet arthropathy. L2-3: Small broad-based disc bulge. Moderate facet arthropathy and ligamentum flavum redundancy. No canal stenosis. Moderate RIGHT and mild LEFT neural foraminal narrowing. L3-4: Annular bulging. Severe facet arthropathy and ligamentum flavum redundancy. Mild canal stenosis. No neural foraminal narrowing. L4-5: Anterolisthesis.  Annular bulging. Moderate to severe facet arthropathy with  trace facet effusions. 9 mm RIGHT facet synovial cyst within paraspinal soft tissues. Moderate canal stenosis. Mild bilateral neural foraminal narrowing. L5-S1: Posterior decompression. Anterolisthesis. Severe facet arthropathy without canal stenosis. Mild-to-moderate bilateral neural foraminal narrowing. IMPRESSION: MRI head: 1. No acute intracranial process. 2. Stable moderate chronic small vessel ischemic changes. MRI cervical spine: 1. Motion degraded examination. 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 2. Grade 1 C3-4 and C7-T1 anterolisthesis. No acute osseous process. 3. No canal stenosis. Neural foraminal narrowing C3-4 through C7-T1: Likely severe at C3-4 and C6-7. Given degree of motion, CT cervical spine may be of added value. MRI thoracic spine: 1. Motion degraded examination.  No fracture or malalignment. 2. Stable degenerative change of the thoracic spine. No canal. Moderate RIGHT T1-2 neural foraminal narrowing. MRI lumbar spine: 1. Motion degraded examination. 2. Stable grade 1 L4-5 and L5-S1 anterolisthesis. Status post L5 laminectomies. 3. 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 4. Stable neural foraminal narrowing L2-3, L4-5 and L5-S1: Moderate on the RIGHT at L2-3. Electronically Signed   By: Elon Alas M.D.   On: 02/21/2018 23:24   US Abdomen Complete  Result Date: 02/16/2018 CLINICAL DATA:  Nausea. EXAM: ABDOMEN ULTRASOUND COMPLETE COMPARISON:  None. FINDINGS: Gallbladder: No gallstones or wall thickening visualized. No sonographic Murphy sign noted by sonographer. Common bile duct: Diameter: 3 mm which is within normal limits. Liver: No focal lesion identified. Increased echogenicity of hepatic parenchyma is noted. Portal vein is patent on color Doppler imaging with normal direction of blood flow towards the liver. IVC: No abnormality visualized. Pancreas: Not well visualized due to overlying bowel gas.  Spleen: Size and appearance within normal limits. Right Kidney: Length: 11.1 cm. Echogenicity within normal limits. No mass or hydronephrosis visualized. Left Kidney: Length: 12.2 cm. Two simple cysts are noted with the largest measuring 3.2 cm in lower pole. Echogenicity within normal limits. No mass or hydronephrosis visualized. Abdominal aorta: 3.1 cm infrarenal abdominal aortic aneurysm is noted. Other findings: None. IMPRESSION: Increased echogenicity of hepatic parenchyma is noted suggesting fatty infiltration or other diffuse hepatocellular disease. Two simple left renal cysts. 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 Recommend followup by ultrasound in 3 years. This recommendation follows ACR consensus guidelines: White Paper of the ACR Incidental Findings Committee II on Vascular Findings. J Am Coll Radiol 2013; 10:789-794. Electronically Signed   By: Marijo Conception, M.D.   On: 02/16/2018 11:43   Nm Hepato W/eject Fract  Result Date: 02/19/2018 CLINICAL DATA:  Nausea and vomiting 1 month. EXAM: NUCLEAR MEDICINE HEPATOBILIARY IMAGING WITH GALLBLADDER EF TECHNIQUE: Sequential images of the abdomen were obtained out to 60 minutes following intravenous administration of radiopharmaceutical. After oral ingestion of Ensure, gallbladder ejection fraction was determined. At 60 min, normal ejection fraction is greater than 33%. RADIOPHARMACEUTICALS:  5.9 mCi Tc-62m  Choletec IV COMPARISON:  Abdominal ultrasound 02/16/2018 FINDINGS: Prompt uptake and biliary excretion of activity by the liver is seen. Gallbladder activity is visualized at 20-25 minutes, consistent with patency of cystic duct. Biliary activity passes into small bowel at 20 minutes, consistent with patent common bile duct. Calculated gallbladder ejection fraction is 56%%. (Normal gallbladder ejection fraction with Ensure is greater than 33%.) IMPRESSION: Normal HIDA scan without evidence of cholecystitis. Normal gallbladder ejection  fraction. Electronically Signed   By: Marin Olp M.D.   On: 02/19/2018 14:29   Dg Chest Portable 1 View  Result Date: 02/21/2018 CLINICAL DATA:  Nausea, vomiting and depression after being treated for urinary tract infection with  Cipro. EXAM: PORTABLE CHEST 1 VIEW COMPARISON:  10/23/2017 FINDINGS: The heart size and mediastinal contours are within normal limits. Both lungs are clear. Stable mild elevation or eventration of the right hemidiaphragm. 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 IMPRESSION: No active disease. Electronically Signed   By: Ashley Royalty M.D.   On: 02/21/2018 18:15          Ref Range & Units 03:29 1d ago 9d ago 22mo ago  WBC 4.0 - 10.5 K/uL 11.2High   10.2  7.8  12.7High  R  RBC 4.22 - 5.81 MIL/uL 5.20  5.46  5.23  3.27Low  R  Hemoglobin 13.0 - 17.0 g/dL 15.6  16.3  15.8  10.6Low  R  HCT 39.0 - 52.0 % 47.7  48.9  47.8  30.2Low  R  MCV 80.0 - 100.0 fL 91.7  89.6  91.4  92.4   MCH 26.0 - 34.0 pg 30.0  29.9  30.2  32.3   MCHC 30.0 - 36.0 g/dL 32.7  33.3  33.1  35.0 R  RDW 11.5 - 15.5 % 13.2  13.2  13.2  13.5 R  Platelets 150 - 400 K/uL 237  237  255  164 R, CM  nRBC 0.0 - 0.2 % 0.0           ASSESSMENT/PLAN   Acute Hypoxic Respiratory Failure - possible due to hemothorax from injury secondary to fall while on anticoagulation vs transudative effusion with atelectasis -will need diagnostic thoracentesis and if hemothorax will need large bore chest tube to avoid fibrothorax -improved clinically with oxygen therapy - CT surgery consultation for both diagnostic tap and therapeutic large bore tube -Discussed IVC filter - I dont feel this is necessary currently -would continue with Eliquis 12h  post procedure    Right sided Pleural effusion  -as above         Patient/Family are satisfied with Plan of action and management. All questions answered    Thank you for consultation , will continue to follow with you.   Will be glad to  see patient in clinic post hospitalization     Ottie Glazier, MD Division of Pulmonary and Critical Care Medicine 2:36 PM 03/02/2018

## 2018-03-02 NOTE — Progress Notes (Signed)
Patient ID: Mark [name]., male   DOB: 10/23/1940, 77 y.o.   MRN: [id number] patient seen chart reviewed.  And has been having issues with gait instability. He was discharged on 12 three 2019 with gait instability physical therapy was arranged at home patient was seen one time after discharge came in after he had a mechanical fall yesterday has a bruise over the left rib cage area. 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  CT chest shows possible hemothorax. Eliquis  on hold. Pulmonary consultation obtained. Discussed with Dr. Genevive Bi to see if patient is a candidate for chest placement.  Discussed with patient and wife

## 2018-03-02 NOTE — Evaluation (Signed)
Occupational Therapy Evaluation Patient Details Name: Mark [name]. MRN: [id number] DOB: 05-15-1940 Today's Date: 03/02/2018    History of Present Illness Patient admitted from home with reported fall and subsequent back pain. PMH to include anxiety, afib, gerd, HLD, OA, vertigo.   Clinical Impression   Pt seen for OT evaluation this date. Prior to hospital admission, pt was independent with mobility and required assistance with showering, dressing, and grooming tasks 2/2 balance issues.  Pt lives with spouse in a house with 2 steps to enter. Pt  Presented supine in bed with wife and son in room. Pt on 2L of O2 at start of session and removel of nasal cannula during mobility. Pt HR ranged from 88 (EOB) to 113 (after exertion) throughout sesson. O2 levels ranged from 90-94% throughout session. Encouraged pursed lip breathing throughout.  Pt not on O2 at home; did not place nasal cannula back on pt with approval from RN. Pt noted feeling a bit dizzy upon standing and walking but recovered quickly upon sitting. Coordination impairments noted in BUE when assessed. Currently pt demonstrates impairments in (see OT Problem List below) requiring  MIN assist for LB dressing and bathing tasks with cues for safety.  Pt would benefit from skilled OT to address noted impairments and functional limitations (see below for any additional details) in order to maximize safety and independence while minimizing falls risk and caregiver burden.  Upon hospital discharge, recommend pt discharge to [facility] with 24/7 supervision.    Follow Up Recommendations  Home health OT;Supervision/Assistance - 24 hour    Equipment Recommendations  (reacher, sock aid for LB dressing)    Recommendations for Other Services       Precautions / Restrictions Precautions Precautions: Fall Precaution Comments: leaning to the right in standing Restrictions Weight Bearing Restrictions: No      Mobility Bed Mobility Overal bed  mobility: Needs Assistance Bed Mobility: Supine to Sit     Supine to sit: HOB elevated;Min assist     General bed mobility comments: Pt required hand held assist to get to EOB 2/2 pain.  Transfers Overall transfer level: Needs assistance Equipment used: Rolling walker (2 wheeled) Transfers: Sit to/from Stand Sit to Stand: Min assist         General transfer comment: Pt needed two attempts to stand with cues for  hand/foot  placement and widening BOS.     Balance Overall balance assessment: Needs assistance Sitting-balance support: No upper extremity supported;Feet supported Sitting balance-Leahy Scale: Good     Standing balance support: Bilateral upper extremity supported Standing balance-Leahy Scale: Fair Standing balance comment: statically pt is able to maintain balance relatively well with walker, heavy use of BUE in RW.  Leaning to the R during activity. Narrow BOS in standing.                           ADL either performed or assessed with clinical judgement   ADL Overall ADL's : Needs assistance/impaired Eating/Feeding: Supervision/ safety;Sitting   Grooming: Sitting;Supervision/safety   Upper Body Bathing: Sitting;Supervision/ safety   Lower Body Bathing: Sitting/lateral leans;Cueing for safety;Minimal assistance       Lower Body Dressing: Minimal assistance;Sit to/from stand;Cueing for safety   Toilet Transfer: RW;Ambulation;Regular Toilet;Min guard;Cueing for safety           Functional mobility during ADLs: Min guard;Rolling walker;Cueing for safety       Vision Baseline Vision/History: Wears glasses Wears Glasses: At all times  Patient Visual Report: No change from baseline       Perception     Praxis      Pertinent Vitals/Pain Pain Assessment: Faces  Faces Pain Scale: Hurts little more Pain Location: back, right side Pain Descriptors / Indicators: Grimacing Pain Intervention(s): Monitored during  session;Repositioned;Limited activity within patient's tolerance     Hand Dominance Right   Extremity/Trunk Assessment Upper Extremity Assessment Upper Extremity Assessment: (Pt slow to complete coordination tests. BUE overshoots target; impaired pronation/supination)   Lower Extremity Assessment Lower Extremity Assessment: Overall WFL for tasks assessed       Communication Communication Communication: HOH   Cognition Arousal/Alertness: Awake/alert Behavior During Therapy: WFL for tasks assessed/performed Overall Cognitive Status: Within Functional Limits for tasks assessed(Occassionally pt's wife needed to correct PLOF questions. ) Area of Impairment: Safety/judgement                         Safety/Judgement: Decreased awareness of safety;Decreased awareness of deficits         General Comments  HR ranged from 88 (EOB) to 113 (after exertion) throughout sesison. O2 levels ranged from 90-94% throughout session. Pt not on O2 at home; did not place nasal cannula back on pt with approval from RN.      Exercises Other Exercises Other Exercises: Pt/family educated in energy conservation techniques: pursed lip breathing  Other Exercises: Pt educated in correct use of RW with hand/foot placement   Shoulder Instructions      Home Living Family/patient expects to be discharged to:: Private residence Living Arrangements: Spouse/significant other Available Help at Discharge: Family Type of Home: House Home Access: Stairs to enter Technical brewer of Steps: 2 Entrance Stairs-Rails: None Home Layout: One level     Bathroom Shower/Tub: Occupational psychologist: Handicapped height Bathroom Accessibility: Yes   Home Equipment: Environmental consultant - 2 wheels;Cane - quad;Shower seat - built in;Grab bars - tub/shower;Grab bars - toilet   Additional Comments: family planning to install handrails at steps to enter home.       Prior Functioning/Environment Level of  Independence: Independent with assistive device(s)        Comments: Pt was independent ambulating and getting dressed with min assist from his wife for pants and socks recently.  Pt requires assistance to dry off and needs to sit to complete routine grooming tasks. She does not leave him at home unsupervised.        OT Problem List: Decreased strength;Impaired balance (sitting and/or standing);Decreased coordination;Decreased safety awareness;Pain;Cardiopulmonary status limiting activity;Decreased knowledge of use of DME or AE;Decreased activity tolerance      OT Treatment/Interventions: Self-care/ADL training;Therapeutic exercise;Energy conservation;Patient/family education;Balance training;Therapeutic activities;DME and/or AE instruction    OT Goals(Current goals can be found in the care plan section) Acute Rehab OT Goals Patient Stated Goal: to decrease falls  OT Goal Formulation: With patient/family Time For Goal Achievement: 03/16/18 Potential to Achieve Goals: Good ADL Goals Pt Will Perform Lower Body Dressing: with adaptive equipment;sit to/from stand;with supervision Additional ADL Goal #1: pt will independently utilize energy conservation techniques including purse lip breathing while completing ADL task. Additional ADL Goal #2: Pt will utilize at least 2 falls prevention strategies with VC while completing ADL tasks.  OT Frequency: Min 1X/week   Barriers to D/C:            Co-evaluation              AM-PAC OT "6 Clicks" Daily Activity  Outcome Measure Help from another person eating meals?: None Help from another person taking care of personal grooming?: None Help from another person toileting, which includes using toliet, bedpan, or urinal?: A Little Help from another person bathing (including washing, rinsing, drying)?: A Little Help from another person to put on and taking off regular upper body clothing?: None Help from another person to put on and taking  off regular lower body clothing?: A Little 6 Click Score: 21   End of Session Equipment Utilized During Treatment: Gait belt;Rolling walker  Activity Tolerance: Patient tolerated treatment well Patient left: in bed(Pt left EOB to eat lunch with family at his side. )  OT Visit Diagnosis: Unsteadiness on feet (R26.81);History of falling (Z91.81);Pain Pain - Right/Left: Right Pain - part of body: (back, R side)                Time: 2836-6294 OT Time Calculation (min): 26 min Charges:     Jadene Pierini OTS  03/02/2018, 1:24 PM

## 2018-03-02 NOTE — Care Management Obs Status (Signed)
[place] NOTIFICATION   Patient Details  Name: Mark [name]. MRN: [id number] Date of Birth: 16-Feb-1941   Medicare Observation Status Notification Given:  No(Attempted to review with patient and family.  MD is currently meeting with patient.  Will attempt at a later time )    Beverly Sessions, RN 03/02/2018, 3:29 PM

## 2018-03-02 NOTE — Progress Notes (Signed)
Initial Nutrition Assessment  DOCUMENTATION CODES:   Not applicable  INTERVENTION:   Liberalize diet  Ensure Enlive po BID, each supplement provides 350 kcal and 20 grams of protein  MVI daily  NUTRITION DIAGNOSIS:   Inadequate oral intake related to acute illness as evidenced by per patient/family report.  GOAL:   Patient will meet greater than or equal to 90% of their needs   MONITOR:   PO intake, Supplement acceptance, Labs, Weight trends, Skin, I & O's  REASON FOR ASSESSMENT:   Malnutrition Screening Tool    ASSESSMENT:   77 y.o. male with past medical history of atrial fibrillation on anticoagulation, depression and anxiety, hyperlipidemia, GERD, lumbar stenosis with neurogenic claudication, pulmonary embolism, sensory neuropathy, vertigo, and recurrent UTI admitted with gait difficulty and a fall  Met with pt in room today. Pt reports poor appetite and oral intake for 1 month pta r/t UTI. Pt reports his appetite is improving; pt ate 100% of his breakfast today. Pt does enjoy vanilla Ensure and would like to have this while in hospital. Per chart, pt appears to have lost ~10lbs(4%) over the past month; this is not significant. RD will add supplements and MVI to help pt meet his estimated needs. RD will also liberalize diet as a heart healthy diet is restrictive of protein.   Medications reviewed and include: colace, MVI, protonix, B12  Labs reviewed:   NUTRITION - FOCUSED PHYSICAL EXAM:    Most Recent Value  Orbital Region  No depletion  Upper Arm Region  No depletion  Thoracic and Lumbar Region  No depletion  Buccal Region  No depletion  Temple Region  No depletion  Clavicle Bone Region  No depletion  Clavicle and Acromion Bone Region  No depletion  Scapular Bone Region  No depletion  Dorsal Hand  No depletion  Patellar Region  No depletion  Anterior Thigh Region  No depletion  Posterior Calf Region  No depletion  Edema (RD Assessment)  None  Hair   Reviewed  Eyes  Reviewed  Mouth  Reviewed  Skin  Reviewed  Nails  Reviewed     Diet Order:   Diet Order            Diet regular Room service appropriate? Yes; Fluid consistency: Thin  Diet effective now             EDUCATION NEEDS:   Education needs have been addressed  Skin:  Skin Assessment: Reviewed RN Assessment  Last BM:  12/10  Height:   Ht Readings from Last 1 Encounters:  03/01/18 6' 4"  (1.93 m)    Weight:   Wt Readings from Last 1 Encounters:  03/01/18 99.8 kg    Ideal Body Weight:  91.8 kg  BMI:  Body mass index is 26.78 kg/m.  Estimated Nutritional Needs:   Kcal:  2200-2500kcal/day   Protein:  100-110g/day   Fluid:  >2.3L/day   Koleen Distance MS, RD, LDN Pager #- [phone] Office#- [phone] After Hours Pager: [phone]

## 2018-03-02 NOTE — Care Management Note (Signed)
Case Management Note  Patient Details  Name: Mark [name]. MRN: [id number] Date of Birth: [birth year]   Patient admitted for hemothorax.  Patient lives at home with wife. PCP [facility].  Patient has RW, cane, shower seat in the home.  Patient currently open with [facility] for RN, PT, and OT.  MD is requesting increased services in the home.  Corene Cornea with [facility] aware of admission and request.  Subjective/Objective:                    Action/Plan:   Expected Discharge Date:                  Expected Discharge Plan:  [place]  In-House Referral:     Discharge planning Services  CM Consult  Post Acute Care Choice:  Resumption of Svcs/PTA Provider Choice offered to:     DME Arranged:    DME Agency:     HH Arranged:  RN, PT, OT [place] Agency:  [facility]  Status of Service:  In process, will continue to follow  If discussed at Long Length of Stay Meetings, dates discussed:    Additional Comments:  Beverly Sessions, RN 03/02/2018, 3:34 PM

## 2018-03-02 NOTE — Evaluation (Signed)
Physical Therapy Evaluation Patient Details Name: Mark [name]. MRN: [id number] DOB: [birth year] Today's Date: 03/02/2018   History of Present Illness  Patient admitted from home with reported fall and subsequent back pain. PMH to include anxiety, afib, gerd, HLD, OA, vertigo.    Clinical Impression  Patient received in the bed with family present. Patient nodding off during evaluation. Agrees to attempt mobility. Patient reports 10/10 pain in back/right side due to fall at home. Has been medicated prior to my arrival. Patient performed supine><sit with supervision and set up assist. Cues for safety. Transfers with min assist and cues for safety/hand placement/impuslivity. Patient is able to ambulate 100 feet with rolling walker and cga. Leaning to right side with standing activities and demonstrates poor balance, very narrow bos. Patient will benefit from skilled PT while at [facility] to address his functional limitations, weakness and difficulty with walking.     Follow Up Recommendations Home health PT    Equipment Recommendations  None recommended by PT    Recommendations for Other Services       Precautions / Restrictions Precautions Precautions: Fall Precaution Comments: leaning to the right in standing Restrictions Weight Bearing Restrictions: No      Mobility  Bed Mobility Overal bed mobility: Modified Independent             General bed mobility comments: Pt able to get to sitting w/o direct assist, needed rails and cuing  Transfers Overall transfer level: Modified independent Equipment used: Rolling walker (2 wheeled)             General transfer comment: Pt needed multiple attempts to stand independently with cues for U&LE set up, hand placement.   Ambulation/Gait Ambulation/Gait assistance: Min guard Gait Distance (Feet): 100 Feet Assistive device: Rolling walker (2 wheeled) Gait Pattern/deviations: Decreased step length - right;Decreased step length -  left;Narrow base of support Gait velocity: decreased   General Gait Details: Pt reliant on the walker t/o the effort and showed decreased R LE quality of motion and foot placement.  He consistently leaned to the R and with turning to the R was outside BOS.  Pt with decreased awareness despite consistent cuing.  Patient sat down on bed without reaching back for bed despite cues.   Stairs            Wheelchair Mobility    Modified Rankin (Stroke Patients Only)       Balance Overall balance assessment: Modified Independent;Needs assistance;History of Falls Sitting-balance support: Single extremity supported;Feet supported Sitting balance-Leahy Scale: Good     Standing balance support: Bilateral upper extremity supported Standing balance-Leahy Scale: Fair Standing balance comment: statically pt is able to maintain balance relatively well with walker, however he gets leaning to the R during activity. Narrow BOS in standing.                             Pertinent Vitals/Pain Pain Assessment: 0-10 Pain Score: 10-Worst pain ever(patient reporting 10/10 pain however nodding off while im present in room. ) Pain Location: back, right side Pain Descriptors / Indicators: Aching;Discomfort;Grimacing Pain Intervention(s): Monitored during session;Premedicated before session    [name] expects to be discharged to:: Private residence Living Arrangements: Spouse/significant other Available Help at Discharge: Family Type of Home: House Home Access: Stairs to enter Entrance Stairs-Rails: None Entrance Stairs-Number of Steps: 2 Home Layout: One level Home Equipment: Environmental consultant - 2 wheels;Cane - quad;Shower seat - built in;Grab  bars - tub/shower;Grab bars - toilet Additional Comments: family planning to install handrails at steps to enter home.     Prior Function Level of Independence: Independent with assistive device(s)         Comments: Pt was independent  ambulating and getting dressed with min assist from his wife for pants and socks recently.  She does not leave him at home unsupervised.     Hand Dominance   Dominant Hand: Right    Extremity/Trunk Assessment   Upper Extremity Assessment Upper Extremity Assessment: Overall WFL for tasks assessed    Lower Extremity Assessment Lower Extremity Assessment: Overall WFL for tasks assessed       Communication   Communication: HOH  Cognition Arousal/Alertness: Awake/alert;Lethargic;Suspect due to medications Behavior During Therapy: [facility] for tasks assessed/performed Overall Cognitive Status: History of cognitive impairments - at baseline Area of Impairment: Safety/judgement;Awareness                         Safety/Judgement: Decreased awareness of safety            General Comments      Exercises     Assessment/Plan    PT Assessment Patient needs continued PT services  PT Problem List Decreased strength;Decreased mobility;Decreased safety awareness;Decreased coordination;Decreased activity tolerance;Decreased cognition;Decreased knowledge of precautions;Decreased balance;Pain       PT Treatment Interventions Gait training;DME instruction;Therapeutic activities;Therapeutic exercise;Patient/family education;Stair training;Balance training;Functional mobility training    PT Goals (Current goals can be found in the Care Plan section)  Acute Rehab PT Goals Patient Stated Goal: to decrease falls  PT Goal Formulation: With patient/family Time For Goal Achievement: 03/16/18 Potential to Achieve Goals: Fair    Frequency Min 2X/week   Barriers to discharge        Co-evaluation               AM-PAC PT "6 Clicks" Mobility  Outcome Measure Help needed turning from your back to your side while in a flat bed without using bedrails?: A Little Help needed moving from lying on your back to sitting on the side of a flat bed without using bedrails?: A Little Help  needed moving to and from a bed to a chair (including a wheelchair)?: A Little Help needed standing up from a chair using your arms (e.g., wheelchair or bedside chair)?: A Little Help needed to walk in hospital room?: A Little Help needed climbing 3-5 steps with a railing? : A Lot 6 Click Score: 17    End of Session Equipment Utilized During Treatment: Gait belt Activity Tolerance: Patient tolerated treatment well;Patient limited by pain Patient left: in bed;with bed alarm set;with family/visitor present;with call bell/phone within reach Nurse Communication: Mobility status PT Visit Diagnosis: Other abnormalities of gait and mobility (R26.89);Repeated falls (R29.6);Muscle weakness (generalized) (M62.81);Unsteadiness on feet (R26.81);Pain Pain - Right/Left: Right Pain - part of body: (Right side due to fall)    Time: 1100-1120 PT Time Calculation (min) (ACUTE ONLY): 20 min   Charges:   PT Evaluation $PT Eval Low Complexity: 1 Low PT Treatments $Gait Training: 8-22 mins        Josy Peaden, PT, GCS 03/02/18,12:37 PM

## 2018-03-02 NOTE — Progress Notes (Signed)

## 2018-03-02 NOTE — Progress Notes (Signed)
Patient ID: Mark Tumbleson., male   DOB: February 22, 1941, 77 y.o.   MRN: [id number]  Chief Complaint  Patient presents with  . Fall    Referred By Dr. Wray Kearns Reason for Referral right pleural effusion  HPI Location, Quality, Duration, Severity, Timing, Context, Modifying Factors, Associated Signs and Symptoms.  Mark Decandia. is a 77 y.o. male.  This patient carries a diagnosis of factor V Leiden deficiency and has been on chronic anticoagulation for the last 20 years.  In the summer 2019 he suffered a fall injuring his left hip and had a significant soft tissue hematoma and bruising.  He was ultimately placed on Eliquis and over the last several weeks he has begun experiencing increasing problems with gait and instability.  Yesterday evening he fell at home suffering a significant fall to his right lateral chest and came into the emergency room department for further evaluation.  He had a chest x-ray made which revealed no acute abnormalities but a subsequent CT scan showed a small right-sided pleural effusion without any obvious rib fractures or other pathology.  The patient states that he has had significant problems walking over the last month and this is now progressed to where he is very unsteady and is unable to walk even with a walker.  He has come plaints of pain with deep inspiration on the right.  He denies any hemoptysis or current shortness of breath.   Past Medical History:  Diagnosis Date  . Anxiety   . Atrial fibrillation ([place])   . Back pain, chronic   . Colon polyp   . Cyst of left kidney   . GERD (gastroesophageal reflux disease)   . Heart murmur   . Hyperlipemia   . Mitral valve prolapse   . Neuromuscular disorder ([place])   . Neuropathy   . Osteoarthritis   . Pulmonary emboli ([place])   . Spinal stenosis   . Thyroid nodule   . Vertigo     Past Surgical History:  Procedure Laterality Date  . APPENDECTOMY    . BACK SURGERY    . COLONOSCOPY N/A 08/15/2014   Procedure: COLONOSCOPY;  Surgeon: [name], MD;  Location[facility] ENDOSCOPY;  Service: Endoscopy;  Laterality: N/A;  . ESOPHAGOGASTRODUODENOSCOPY N/A 08/15/2014   Procedure: ESOPHAGOGASTRODUODENOSCOPY (EGD);  Surgeon: [name], MD;  Location: [facility] ENDOSCOPY;  Service: Endoscopy;  Laterality: N/A;  . TONSILLECTOMY      No family history on file.  Social History Social History   Tobacco Use  . Smoking status: Never Smoker  . Smokeless tobacco: Never Used  Substance Use Topics  . Alcohol use: Yes    Alcohol/week: 1.0 standard drinks    Types: 1 Glasses of wine per week  . Drug use: Not on file    Allergies  Allergen Reactions  . Erythromycin Swelling  . Keflex [Cephalexin] Swelling  . Penicillins Swelling    Has patient had a PCN reaction causing immediate rash, facial/tongue/throat swelling, SOB or lightheadedness with hypotension: Yes Has patient had a PCN reaction causing severe rash involving mucus membranes or skin necrosis: No Has patient had a PCN reaction that required hospitalization: Unknown Has patient had a PCN reaction occurring within the last 10 years: No If all of the above answers are "NO", then may proceed with Cephalosporin use.     Current Facility-Administered Medications  Medication Dose Route Frequency Provider Last Rate Last Dose  . acetaminophen (TYLENOL) tablet 650 mg  650 mg Oral Q6H  PRN Amelia Jo, MD       Or  . acetaminophen (TYLENOL) suppository 650 mg  650 mg Rectal Q6H PRN Amelia Jo, MD      . bisacodyl (DULCOLAX) EC tablet 5 mg  5 mg Oral Daily PRN Amelia Jo, MD      . docusate sodium (COLACE) capsule 100 mg  100 mg Oral BID Amelia Jo, MD   100 mg at 03/02/18 0916  . feeding supplement (ENSURE ENLIVE) (ENSURE ENLIVE) liquid 237 mL  237 mL Oral BID BM Fritzi Mandes, MD   237 mL at 03/02/18 1540  . finasteride (PROSCAR) tablet 5 mg  5 mg Oral Daily Amelia Jo, MD   5 mg at 03/02/18 0916  . FLUoxetine (PROZAC) capsule  40 mg  40 mg Oral Daily Amelia Jo, MD   40 mg at 03/02/18 1103  . HYDROcodone-acetaminophen (NORCO/VICODIN) 5-325 MG per tablet 1-2 tablet  1-2 tablet Oral Q4H PRN Amelia Jo, MD   1 tablet at 03/02/18 0916  . multivitamin with minerals tablet 1 tablet  1 tablet Oral Daily Fritzi Mandes, MD   1 tablet at 03/02/18 1540  . ondansetron (ZOFRAN) tablet 4 mg  4 mg Oral Q6H PRN Amelia Jo, MD       Or  . ondansetron [facility]) injection 4 mg  4 mg Intravenous Q6H PRN Amelia Jo, MD      . pantoprazole (PROTONIX) EC tablet 40 mg  40 mg Oral Daily Amelia Jo, MD   40 mg at 03/02/18 0916  . simvastatin (ZOCOR) tablet 40 mg  40 mg Oral QHS Amelia Jo, MD      . tamsulosin [facility]) capsule 0.4 mg  0.4 mg Oral Daily Amelia Jo, MD   0.4 mg at 03/02/18 0916  . traZODone (DESYREL) tablet 25 mg  25 mg Oral QHS PRN Amelia Jo, MD      . vitamin B-12 (CYANOCOBALAMIN) tablet 5,000 mcg  5,000 mcg Oral Daily Amelia Jo, MD   5,000 mcg at 03/02/18 3474      Review of Systems A complete review of systems was asked and was negative except for the following positive findings chest pain with deep inspiration.  Blood pressure 125/66, pulse 75, temperature 97.8 F (36.6 C), temperature source Oral, resp. rate 16, height 6\' 4"  (1.93 m), weight 99.8 kg, SpO2 92 %.  Physical Exam CONSTITUTIONAL:  Pleasant, well-developed, well-nourished, and in no acute distress. EYES: Pupils equal and reactive to light, Sclera non-icteric EARS, NOSE, MOUTH AND THROAT:  The oropharynx was clear.  Dentition is good repair.  Oral mucosa pink and moist. LYMPH NODES:  Lymph nodes in the neck and axillae were normal RESPIRATORY:  Lungs were clear.  Normal respiratory effort without pathologic use of accessory muscles of respiration CARDIOVASCULAR: Heart was regular without murmurs.  There were no carotid bruits. GI: The abdomen was soft, nontender, and nondistended. There were no palpable masses. There was no  hepatosplenomegaly. There were normal bowel sounds in all quadrants. GU:  Rectal deferred.   MUSCULOSKELETAL:  Normal muscle strength and tone.  No clubbing or cyanosis.  There is a 6 to 7 cm area of bruising on the posterior right chest wall. SKIN:  There were no pathologic skin lesions.  There were no nodules on palpation. NEUROLOGIC:  Sensation is normal.  Cranial nerves are grossly intact. PSYCH:  Oriented to person, place and time.  Mood and affect are normal.  Data Reviewed CT scan of the chest  I have personally  reviewed the patient's imaging, laboratory findings and medical records.    Assessment    Right pleural effusion status post fall.    Plan    I have independently reviewed the patient's chest CT.  There is a small pleural effusion.  The Hounsfield units suggest a proteinaceous fluid density which may be some blood.  However the patient currently has minimal symptoms and at this point I would follow him clinically.  I would defer on tapping or chest drainage.       Nestor Lewandowsky, MD 03/02/2018, 3:53 PM

## 2018-03-03 ENCOUNTER — Observation Stay: Payer: Medicare Other

## 2018-03-03 DIAGNOSIS — R269 Unspecified abnormalities of gait and mobility: Secondary | ICD-10-CM | POA: Diagnosis present

## 2018-03-03 DIAGNOSIS — E785 Hyperlipidemia, unspecified: Secondary | ICD-10-CM | POA: Diagnosis present

## 2018-03-03 DIAGNOSIS — W01190A Fall on same level from slipping, tripping and stumbling with subsequent striking against furniture, initial encounter: Secondary | ICD-10-CM | POA: Diagnosis present

## 2018-03-03 DIAGNOSIS — F419 Anxiety disorder, unspecified: Secondary | ICD-10-CM | POA: Diagnosis present

## 2018-03-03 DIAGNOSIS — Z86711 Personal history of pulmonary embolism: Secondary | ICD-10-CM | POA: Diagnosis not present

## 2018-03-03 DIAGNOSIS — Z8719 Personal history of other diseases of the digestive system: Secondary | ICD-10-CM | POA: Diagnosis not present

## 2018-03-03 DIAGNOSIS — Z7901 Long term (current) use of anticoagulants: Secondary | ICD-10-CM | POA: Diagnosis not present

## 2018-03-03 DIAGNOSIS — G8929 Other chronic pain: Secondary | ICD-10-CM | POA: Diagnosis present

## 2018-03-03 DIAGNOSIS — J90 Pleural effusion, not elsewhere classified: Secondary | ICD-10-CM | POA: Diagnosis present

## 2018-03-03 DIAGNOSIS — Z888 Allergy status to other drugs, medicaments and biological substances status: Secondary | ICD-10-CM | POA: Diagnosis not present

## 2018-03-03 DIAGNOSIS — K219 Gastro-esophageal reflux disease without esophagitis: Secondary | ICD-10-CM | POA: Diagnosis present

## 2018-03-03 DIAGNOSIS — J9601 Acute respiratory failure with hypoxia: Secondary | ICD-10-CM | POA: Diagnosis not present

## 2018-03-03 DIAGNOSIS — Y92009 Unspecified place in unspecified non-institutional (private) residence as the place of occurrence of the external cause: Secondary | ICD-10-CM | POA: Diagnosis not present

## 2018-03-03 DIAGNOSIS — M549 Dorsalgia, unspecified: Secondary | ICD-10-CM | POA: Diagnosis present

## 2018-03-03 DIAGNOSIS — J9811 Atelectasis: Secondary | ICD-10-CM | POA: Diagnosis not present

## 2018-03-03 DIAGNOSIS — I482 Chronic atrial fibrillation, unspecified: Secondary | ICD-10-CM | POA: Diagnosis present

## 2018-03-03 DIAGNOSIS — Z880 Allergy status to penicillin: Secondary | ICD-10-CM | POA: Diagnosis not present

## 2018-03-03 DIAGNOSIS — S270XXA Traumatic pneumothorax, initial encounter: Secondary | ICD-10-CM | POA: Diagnosis present

## 2018-03-03 LAB — GLUCOSE, CAPILLARY: Glucose-Capillary: 87 mg/dL (ref 70–99)

## 2018-03-03 MED ORDER — ENSURE ENLIVE PO LIQD: 237.0000 mL | Freq: Two times a day (BID) | ORAL | 12 refills | Status: DC

## 2018-03-03 MED ORDER — ADULT MULTIVITAMIN W/MINERALS CH: 1.0000 | ORAL_TABLET | Freq: Every day | ORAL | 0 refills | Status: DC

## 2018-03-03 MED ORDER — HYDROCODONE-ACETAMINOPHEN 5-325 MG PO TABS: 1.0000 | ORAL_TABLET | ORAL | 0 refills | Status: DC | PRN

## 2018-03-03 MED ORDER — APIXABAN 2.5 MG PO TABS
2.5000 mg | ORAL_TABLET | Freq: Two times a day (BID) | ORAL | Status: DC
  Administered 2018-03-03 (×2): 2.5 mg via ORAL
  Filled 2018-03-03 (×2): qty 1

## 2018-03-03 NOTE — Progress Notes (Signed)
Pulmonary Medicine Consultation                  PROGRESS NOTE        Date: 03/03/2018,   MRN# [id number] Mark [name]. 79/24/1942 Code Status:     Code Status Orders  (From admission, onward)      SUBJECTIVE     Patient resting comfortably in chair in no distress.  Wife present in room.   Reports improvement clinically with mild diaphoresis overnight.      PAST MEDICAL HISTORY   Past Medical History:  Diagnosis Date  . Anxiety   . Atrial fibrillation ([place])   . Back pain, chronic   . Colon polyp   . Cyst of left kidney   . GERD (gastroesophageal reflux disease)   . Heart murmur   . Hyperlipemia   . Mitral valve prolapse   . Neuromuscular disorder ([place])   . Neuropathy   . Osteoarthritis   . Pulmonary emboli ([place])   . Spinal stenosis   . Thyroid nodule   . Vertigo      SURGICAL HISTORY   Past Surgical History:  Procedure Laterality Date  . APPENDECTOMY    . BACK SURGERY    . COLONOSCOPY N/A 08/15/2014   Procedure: COLONOSCOPY;  Surgeon: [name], MD;  Location: [facility] ENDOSCOPY;  Service: Endoscopy;  Laterality: N/A;  . ESOPHAGOGASTRODUODENOSCOPY N/A 08/15/2014   Procedure: ESOPHAGOGASTRODUODENOSCOPY (EGD);  Surgeon: [name], MD;  Location: [facility] ENDOSCOPY;  Service: Endoscopy;  Laterality: N/A;  . TONSILLECTOMY       FAMILY HISTORY   No family history on file.   SOCIAL HISTORY   Social History   Tobacco Use  . Smoking status: Never Smoker  . Smokeless tobacco: Never Used  Substance Use Topics  . Alcohol use: Yes    Alcohol/week: 1.0 standard drinks    Types: 1 Glasses of wine per week  . Drug use: Not on file     MEDICATIONS    Home Medication:  Current Outpatient Rx  . Order #: [id number] Class: Print  . Order #: [id number] Class: Print  . [START ON 03/04/2018] Order #: [id number] Class: Print    Current Medication:  Current Facility-Administered Medications:  .  acetaminophen (TYLENOL) tablet 650 mg, 650 mg,  Oral, Q6H PRN **OR** acetaminophen (TYLENOL) suppository 650 mg, 650 mg, Rectal, Q6H PRN, Amelia Jo, MD .  apixaban Arne [place]) tablet 2.5 mg, 2.5 mg, Oral, BID, Epifanio Lesches, MD, 2.5 mg at 03/03/18 1255 .  bisacodyl (DULCOLAX) EC tablet 5 mg, 5 mg, Oral, Daily PRN, Amelia Jo, MD, 5 mg at 03/03/18 1256 .  docusate sodium (COLACE) capsule 100 mg, 100 mg, Oral, BID, Amelia Jo, MD, 100 mg at 03/03/18 0942 .  feeding supplement (ENSURE ENLIVE) (ENSURE ENLIVE) liquid 237 mL, 237 mL, Oral, BID BM, Fritzi Mandes, MD, 237 mL at 03/03/18 0942 .  finasteride (PROSCAR) tablet 5 mg, 5 mg, Oral, Daily, Amelia Jo, MD, 5 mg at 03/03/18 0942 .  FLUoxetine (PROZAC) capsule 40 mg, 40 mg, Oral, Daily, Amelia Jo, MD, 40 mg at 03/03/18 1256 .  HYDROcodone-acetaminophen (NORCO/VICODIN) 5-325 MG per tablet 1-2 tablet, 1-2 tablet, Oral, Q4H PRN, Amelia Jo, MD, 1 tablet at 03/02/18 1927 .  multivitamin with minerals tablet 1 tablet, 1 tablet, Oral, Daily, Fritzi Mandes, MD, 1 tablet at 03/03/18 [id number] .  ondansetron (ZOFRAN) tablet 4 mg, 4 mg, Oral, Q6H PRN **OR** ondansetron (ZOFRAN) injection 4 mg, 4 mg, Intravenous, Q6H PRN,  Amelia Jo, MD .  pantoprazole (PROTONIX) EC tablet 40 mg, 40 mg, Oral, Daily, Amelia Jo, MD, 40 mg at 03/03/18 0942 .  simvastatin (ZOCOR) tablet 40 mg, 40 mg, Oral, QHS, Amelia Jo, MD, 40 mg at 03/02/18 2241 .  tamsulosin (FLOMAX) capsule 0.4 mg, 0.4 mg, Oral, Daily, Amelia Jo, MD, 0.4 mg at 03/03/18 0942 .  traZODone (DESYREL) tablet 25 mg, 25 mg, Oral, QHS PRN, Amelia Jo, MD .  vitamin B-12 (CYANOCOBALAMIN) tablet 5,000 mcg, 5,000 mcg, Oral, Daily, Amelia Jo, MD, 5,000 mcg at 03/03/18 0941    ALLERGIES   Erythromycin; Keflex [cephalexin]; and Penicillins     REVIEW OF SYSTEMS    Review of Systems:  Gen:  Reports low grade fever,  Mild sweats,  HEENT: Denies blurred vision, double vision, ear pain, eye pain, hearing loss, nose bleeds,  sore throat Cardiac:  No dizziness, chest pain or heaviness, chest tightness,edema Resp:   Denies cough or sputum porduction, shortness of breath,wheezing, hemoptysis,      VS: BP (!) 150/82 (BP Location: Left Arm)   Pulse 87   Temp 97.6 F (36.4 C) (Oral)   Resp 20   Ht 6\' 4"  (1.93 m)   Wt 99.8 kg   SpO2 93%   BMI 26.78 kg/m      PHYSICAL EXAM  Physical Examination:   GENERAL:NAD, no fevers, chills, no weakness no fatigue HEAD: Normocephalic, atraumatic.  EYES: Pupils equal, round, reactive to light. Extraocular muscles intact. No scleral icterus.  MOUTH: Moist mucosal membrane. Dentition intact. No abscess noted.  EAR, NOSE, THROAT: Clear without exudates. No external lesions.  NECK: Supple. No thyromegaly. No nodules. No JVD.  PULMONARY: Diffuse coarse rhonchi right sided +wheezes CARDIOVASCULAR: S1 and S2. Regular rate and rhythm. No murmurs, rubs, or gallops. No edema. Pedal pulses 2+ bilaterally.  GASTROINTESTINAL: Soft, nontender, nondistended. No masses. Positive bowel sounds. No hepatosplenomegaly.  MUSCULOSKELETAL: No swelling, clubbing, or edema. Range of motion full in all extremities.  NEUROLOGIC: Cranial nerves II through XII are intact. No gross focal neurological deficits. Sensation intact. Reflexes intact.  SKIN: No ulceration, lesions, rashes, or cyanosis. Skin warm and dry. Turgor intact.  PSYCHIATRIC: Mood, affect within normal limits. The patient is awake, alert and oriented x 3. Insight, judgment intact.       IMAGING    Dg Chest 2 View  Result Date: 03/01/2018 CLINICAL DATA:  77 y/o  M; right rib pain after fall. EXAM: CHEST - 2 VIEW COMPARISON:  02/21/2018 chest radiograph FINDINGS: Stable heart size and mediastinal contours are within normal limits. Both lungs are clear. No acute osseous abnormality identified. IMPRESSION: 1. No active cardiopulmonary disease. 2. No acute osseous abnormality identified. Consider dedicated rib radiographs if there  is focal tenderness. Electronically Signed   By: Kristine Garbe M.D.   On: 03/01/2018 22:33   Ct Chest Wo Contrast  Result Date: 03/02/2018 CLINICAL DATA:  Right lateral chest pain following a fall. No history of smoking. EXAM: CT CHEST WITHOUT CONTRAST TECHNIQUE: Multidetector CT imaging of the chest was performed following the standard protocol without IV contrast. COMPARISON:  Chest radiographs obtained earlier this evening. Chest, abdomen and pelvis CT dated 01/09/2005. FINDINGS: Cardiovascular: Atheromatous calcifications, including the coronary arteries and aorta. Borderline enlarged heart. Mediastinum/Nodes: No enlarged lymph nodes. Enlarged lateral lobe scratch the enlarged left lobe of the thyroid gland containing small densely calcified nodules. Small to moderate-sized hiatal hernia. Lungs/Pleura: Interval small amount of linear scarring or subsegmental atelectasis in the left upper  lobe. Interval 4 mm average diameter nodule in the superior segment of the right lower lobe on image number 65 series 3. Interval small to moderate-sized right pleural effusion with a medium density posterior component measuring 40 Hounsfield units in density and a lower density anterior component measuring 18 Hounsfield units in density. No pneumothorax. Upper Abdomen: Stable small cystic area in the lateral segment of the left lobe of the liver. Musculoskeletal: Thoracic and lower cervical spine degenerative changes. No fractures are seen. IMPRESSION: 1. Interval small to moderate-sized right pleural effusion with medium and lower density components. This could represent a hemothorax or effusion with proteinaceous fluid. 2. Interval 4 mm average diameter nodule in the superior segment of the right lower lobe. This is too small to characterize, but most likely benign in the absence of known clinical risk factors for lung cancer. No follow-up needed if patient is low-risk. Non-contrast chest CT can be considered  in 12 months if patient is high-risk. This recommendation follows the consensus statement: Guidelines for Management of Incidental Pulmonary Nodules Detected on CT Images: From the Fleischner Society 2017; Radiology 2017; 284:228-243. 3. Interval small amount of linear scarring or subsegmental atelectasis in the left upper lobe. 4.  Calcific coronary artery and aortic atherosclerosis. 5. Small to moderate-sized hiatal hernia. 6. Multinodular goiter. Aortic Atherosclerosis (ICD10-I70.0). Electronically Signed   By: Claudie Revering M.D.   On: 03/02/2018 00:58   Mr Brain Wo Contrast  Result Date: 02/21/2018 CLINICAL DATA:  Nausea, vomiting and weakness. Recent treated urinary tract infection. History of atrial fibrillation, hyperlipidemia, spinal stenosis, spine surgery. EXAM: MRI CERVICAL, THORACIC AND LUMBAR SPINE WITHOUT CONTRAST MRI HEAD WITHOUT CONTRAST TECHNIQUE: Multiplanar and multiecho pulse sequences of the cervical spine, to include the craniocervical junction and cervicothoracic junction, and thoracic and lumbar spine, were obtained without intravenous contrast. Multiplanar, multiecho pulse sequences of the brain and surrounding structures were obtained without intravenous contrast. COMPARISON:  None. MRI head November 21, 2017 and MRI thoracic spine November 12, 2016 and MRI lumbar spine April 21, 2016. FINDINGS: MRI HEAD FINDINGS INTRACRANIAL CONTENTS: No reduced diffusion to suggest acute ischemia or typical infection. No susceptibility artifact to suggest hemorrhage. The ventricles and sulci are normal for patient's age. Patchy to confluent supratentorial white matter FLAIR T2 hyperintensities. No suspicious parenchymal signal, masses, mass effect. No abnormal extra-axial fluid collections. No extra-axial masses. VASCULAR: Normal major intracranial vascular flow voids present at skull base. SKULL AND UPPER CERVICAL SPINE: No abnormal sellar expansion. No suspicious calvarial bone marrow signal.  Craniocervical junction maintained. SINUSES/ORBITS: The mastoid air-cells and included paranasal sinuses are well-aerated.The included ocular globes and orbital contents are non-suspicious. OTHER: None. MRI CERVICAL SPINE FINDINGS-moderately motion degraded sagittal sequences, axial sequences are moderate to severely motion degraded. ALIGNMENT: Straightened cervical lordosis. Grade 1 C3-4 anterolisthesis and C7-T1 anterolisthesis. VERTEBRAE/DISCS: Vertebral bodies are intact. Moderate C3-4 disc height loss with RIGHT T1, bright T2 signal within the disc, no STIR signal abnormality contiguous with LEFT facet, most compatible mineralization. Severe C5-6, C6-7 and moderate C7-T1 disc height loss, and disc desiccation compatible with degenerative discs. Moderate C5-6 and C6-7 subacute discogenic endplate changes. CORD: Linear bright STIR signal at C3-4 not localized on remaining sequences, potentially due to motion. POSTERIOR FOSSA, VERTEBRAL ARTERIES, PARASPINAL TISSUES: No MR findings of ligamentous injury. Vertebral artery flow voids present. Included posterior fossa and paraspinal soft tissues are normal. DISC LEVELS: Limited assessment due to motion. C2-3: Annular bulging, uncovertebral hypertrophy and severe RIGHT, moderate LEFT facet arthropathy. No canal stenosis  or neural foraminal narrowing. C3-4: Anterolisthesis. Small broad-based disc bulge, uncovertebral hypertrophy in severe LEFT, mild RIGHT facet arthropathy. No canal stenosis. Moderate RIGHT and severe suspected LEFT neural foraminal narrowing. C4-5: Small broad-based disc osteophyte and vertebral hypertrophy and severe LEFT and moderate RIGHT facet arthropathy. No canal stenosis. Moderate suspected LEFT neural foraminal narrowing. C5-6: Small broad-based disc bulge, uncovertebral hypertrophy and moderate LEFT, mild RIGHT facet arthropathy. No canal stenosis. Moderate suspected bilateral neural foraminal narrowing. C6-7: Small broad-based disc bulge,  uncovertebral hypertrophy and mild facet arthropathy. No canal stenosis. Severe suspected neural narrowing. C7-T1: Anterolisthesis. Annular bulging and severe RIGHT moderate LEFT facet arthropathy. No canal. Mild moderate RIGHT neural foraminal narrowing. MRI THORACIC SPINE FINDINGS-moderately motion degraded examination. ALIGNMENT: Maintenance of the thoracic kyphosis. No malalignment. VERTEBRAE/DISCS: Vertebral bodies are intact. Advanced upper thoracic disc height loss with bridging bone marrow signal compatible with arthrodesis. Multilevel moderate to severe disc height loss with disc desiccation compatible with degenerative discs. T6 acute discogenic endplate changes versus Schmorl's node. No suspicious bone marrow signal. CORD: Limited assessment spinal cord due to motion, no syrinx or definite myelomalacia. PREVERTEBRAL AND PARASPINAL SOFT TISSUES:  Non suspicious. DISC LEVELS: Small T4-5 central disc protrusion. No canal stenosis. Moderate RIGHT T1-2 neural foraminal narrowing. MRI LUMBAR SPINE FINDINGS-moderately motion degraded axial sequences. SEGMENTATION: For the purposes of this report, the last well-formed intervertebral disc is reported as L5-S1. Transitional anatomy with partially sacralized L5 vertebral body, consistent with prior MRI. ALIGNMENT: Maintained lumbar lordosis. Minimal stable grade 1 L4-5 anterolisthesis and grade 1 L5-S1 anterolisthesis. VERTEBRAE:Vertebral bodies are intact. Similar moderate L2-3 disc height loss, mild at L4-5. Diffuse disc desiccation multilevel mild chronic discogenic endplate changes. L4 inferior endplate acute Schmorl's node. Focal fat versus hemangioma RIGHT L5 pedicle. No suspicious bone marrow signal. CONUS MEDULLARIS AND CAUDA EQUINA: Conus medullaris terminates at L1 and demonstrates normal morphology and signal characteristics. Limited assessment of cauda equina due to motion. PARASPINAL AND OTHER SOFT TISSUES: Non suspicious. Severe paraspinal muscle  atrophy and below the level surgical intervention. DISC LEVELS: L1-2: No disc bulge, canal stenosis nor neural foraminal narrowing. Mild facet arthropathy. L2-3: Small broad-based disc bulge. Moderate facet arthropathy and ligamentum flavum redundancy. No canal stenosis. Moderate RIGHT and mild LEFT neural foraminal narrowing. L3-4: Annular bulging. Severe facet arthropathy and ligamentum flavum redundancy. Mild canal stenosis. No neural foraminal narrowing. L4-5: Anterolisthesis. Annular bulging. Moderate to severe facet arthropathy with trace facet effusions. 9 mm RIGHT facet synovial cyst within paraspinal soft tissues. Moderate canal stenosis. Mild bilateral neural foraminal narrowing. L5-S1: Posterior decompression. Anterolisthesis. Severe facet arthropathy without canal stenosis. Mild-to-moderate bilateral neural foraminal narrowing. IMPRESSION: MRI head: 1. No acute intracranial process. 2. Stable moderate chronic small vessel ischemic changes. MRI cervical spine: 1. Motion degraded examination. 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 2. Grade 1 C3-4 and C7-T1 anterolisthesis. No acute osseous process. 3. No canal stenosis. Neural foraminal narrowing C3-4 through C7-T1: Likely severe at C3-4 and C6-7. Given degree of motion, CT cervical spine may be of added value. MRI thoracic spine: 1. Motion degraded examination.  No fracture or malalignment. 2. Stable degenerative change of the thoracic spine. No canal. Moderate RIGHT T1-2 neural foraminal narrowing. MRI lumbar spine: 1. Motion degraded examination. 2. Stable grade 1 L4-5 and L5-S1 anterolisthesis. Status post L5 laminectomies. 3. 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 4. Stable neural foraminal narrowing L2-3, L4-5 and L5-S1: Moderate on the RIGHT at L2-3. Electronically Signed   By: Thana Farr.D.  On: 02/21/2018 23:24   Mr Cervical Spine Wo Contrast  Result Date: 02/21/2018 CLINICAL DATA:  Nausea, vomiting and weakness.  Recent treated urinary tract infection. History of atrial fibrillation, hyperlipidemia, spinal stenosis, spine surgery. EXAM: MRI CERVICAL, THORACIC AND LUMBAR SPINE WITHOUT CONTRAST MRI HEAD WITHOUT CONTRAST TECHNIQUE: Multiplanar and multiecho pulse sequences of the cervical spine, to include the craniocervical junction and cervicothoracic junction, and thoracic and lumbar spine, were obtained without intravenous contrast. Multiplanar, multiecho pulse sequences of the brain and surrounding structures were obtained without intravenous contrast. COMPARISON:  None. MRI head November 21, 2017 and MRI thoracic spine November 12, 2016 and MRI lumbar spine April 21, 2016. FINDINGS: MRI HEAD FINDINGS INTRACRANIAL CONTENTS: No reduced diffusion to suggest acute ischemia or typical infection. No susceptibility artifact to suggest hemorrhage. The ventricles and sulci are normal for patient's age. Patchy to confluent supratentorial white matter FLAIR T2 hyperintensities. No suspicious parenchymal signal, masses, mass effect. No abnormal extra-axial fluid collections. No extra-axial masses. VASCULAR: Normal major intracranial vascular flow voids present at skull base. SKULL AND UPPER CERVICAL SPINE: No abnormal sellar expansion. No suspicious calvarial bone marrow signal. Craniocervical junction maintained. SINUSES/ORBITS: The mastoid air-cells and included paranasal sinuses are well-aerated.The included ocular globes and orbital contents are non-suspicious. OTHER: None. MRI CERVICAL SPINE FINDINGS-moderately motion degraded sagittal sequences, axial sequences are moderate to severely motion degraded. ALIGNMENT: Straightened cervical lordosis. Grade 1 C3-4 anterolisthesis and C7-T1 anterolisthesis. VERTEBRAE/DISCS: Vertebral bodies are intact. Moderate C3-4 disc height loss with RIGHT T1, bright T2 signal within the disc, no STIR signal abnormality contiguous with LEFT facet, most compatible mineralization. Severe C5-6, C6-7  and moderate C7-T1 disc height loss, and disc desiccation compatible with degenerative discs. Moderate C5-6 and C6-7 subacute discogenic endplate changes. CORD: Linear bright STIR signal at C3-4 not localized on remaining sequences, potentially due to motion. POSTERIOR FOSSA, VERTEBRAL ARTERIES, PARASPINAL TISSUES: No MR findings of ligamentous injury. Vertebral artery flow voids present. Included posterior fossa and paraspinal soft tissues are normal. DISC LEVELS: Limited assessment due to motion. C2-3: Annular bulging, uncovertebral hypertrophy and severe RIGHT, moderate LEFT facet arthropathy. No canal stenosis or neural foraminal narrowing. C3-4: Anterolisthesis. Small broad-based disc bulge, uncovertebral hypertrophy in severe LEFT, mild RIGHT facet arthropathy. No canal stenosis. Moderate RIGHT and severe suspected LEFT neural foraminal narrowing. C4-5: Small broad-based disc osteophyte and vertebral hypertrophy and severe LEFT and moderate RIGHT facet arthropathy. No canal stenosis. Moderate suspected LEFT neural foraminal narrowing. C5-6: Small broad-based disc bulge, uncovertebral hypertrophy and moderate LEFT, mild RIGHT facet arthropathy. No canal stenosis. Moderate suspected bilateral neural foraminal narrowing. C6-7: Small broad-based disc bulge, uncovertebral hypertrophy and mild facet arthropathy. No canal stenosis. Severe suspected neural narrowing. C7-T1: Anterolisthesis. Annular bulging and severe RIGHT moderate LEFT facet arthropathy. No canal. Mild moderate RIGHT neural foraminal narrowing. MRI THORACIC SPINE FINDINGS-moderately motion degraded examination. ALIGNMENT: Maintenance of the thoracic kyphosis. No malalignment. VERTEBRAE/DISCS: Vertebral bodies are intact. Advanced upper thoracic disc height loss with bridging bone marrow signal compatible with arthrodesis. Multilevel moderate to severe disc height loss with disc desiccation compatible with degenerative discs. T6 acute discogenic  endplate changes versus Schmorl's node. No suspicious bone marrow signal. CORD: Limited assessment spinal cord due to motion, no syrinx or definite myelomalacia. PREVERTEBRAL AND PARASPINAL SOFT TISSUES:  Non suspicious. DISC LEVELS: Small T4-5 central disc protrusion. No canal stenosis. Moderate RIGHT T1-2 neural foraminal narrowing. MRI LUMBAR SPINE FINDINGS-moderately motion degraded axial sequences. SEGMENTATION: For the purposes of this report, the last well-formed intervertebral disc is  reported as L5-S1. Transitional anatomy with partially sacralized L5 vertebral body, consistent with prior MRI. ALIGNMENT: Maintained lumbar lordosis. Minimal stable grade 1 L4-5 anterolisthesis and grade 1 L5-S1 anterolisthesis. VERTEBRAE:Vertebral bodies are intact. Similar moderate L2-3 disc height loss, mild at L4-5. Diffuse disc desiccation multilevel mild chronic discogenic endplate changes. L4 inferior endplate acute Schmorl's node. Focal fat versus hemangioma RIGHT L5 pedicle. No suspicious bone marrow signal. CONUS MEDULLARIS AND CAUDA EQUINA: Conus medullaris terminates at L1 and demonstrates normal morphology and signal characteristics. Limited assessment of cauda equina due to motion. PARASPINAL AND OTHER SOFT TISSUES: Non suspicious. Severe paraspinal muscle atrophy and below the level surgical intervention. DISC LEVELS: L1-2: No disc bulge, canal stenosis nor neural foraminal narrowing. Mild facet arthropathy. L2-3: Small broad-based disc bulge. Moderate facet arthropathy and ligamentum flavum redundancy. No canal stenosis. Moderate RIGHT and mild LEFT neural foraminal narrowing. L3-4: Annular bulging. Severe facet arthropathy and ligamentum flavum redundancy. Mild canal stenosis. No neural foraminal narrowing. L4-5: Anterolisthesis. Annular bulging. Moderate to severe facet arthropathy with trace facet effusions. 9 mm RIGHT facet synovial cyst within paraspinal soft tissues. Moderate canal stenosis. Mild  bilateral neural foraminal narrowing. L5-S1: Posterior decompression. Anterolisthesis. Severe facet arthropathy without canal stenosis. Mild-to-moderate bilateral neural foraminal narrowing. IMPRESSION: MRI head: 1. No acute intracranial process. 2. Stable moderate chronic small vessel ischemic changes. MRI cervical spine: 1. Motion degraded examination. 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 2. Grade 1 C3-4 and C7-T1 anterolisthesis. No acute osseous process. 3. No canal stenosis. Neural foraminal narrowing C3-4 through C7-T1: Likely severe at C3-4 and C6-7. Given degree of motion, CT cervical spine may be of added value. MRI thoracic spine: 1. Motion degraded examination.  No fracture or malalignment. 2. Stable degenerative change of the thoracic spine. No canal. Moderate RIGHT T1-2 neural foraminal narrowing. MRI lumbar spine: 1. Motion degraded examination. 2. Stable grade 1 L4-5 and L5-S1 anterolisthesis. Status post L5 laminectomies. 3. 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 4. Stable neural foraminal narrowing L2-3, L4-5 and L5-S1: Moderate on the RIGHT at L2-3. Electronically Signed   By: Elon Alas M.D.   On: 02/21/2018 23:24   Mr Thoracic Spine Wo Contrast  Result Date: 02/21/2018 CLINICAL DATA:  Nausea, vomiting and weakness. Recent treated urinary tract infection. History of atrial fibrillation, hyperlipidemia, spinal stenosis, spine surgery. EXAM: MRI CERVICAL, THORACIC AND LUMBAR SPINE WITHOUT CONTRAST MRI HEAD WITHOUT CONTRAST TECHNIQUE: Multiplanar and multiecho pulse sequences of the cervical spine, to include the craniocervical junction and cervicothoracic junction, and thoracic and lumbar spine, were obtained without intravenous contrast. Multiplanar, multiecho pulse sequences of the brain and surrounding structures were obtained without intravenous contrast. COMPARISON:  None. MRI head November 21, 2017 and MRI thoracic spine November 12, 2016 and MRI lumbar spine  April 21, 2016. FINDINGS: MRI HEAD FINDINGS INTRACRANIAL CONTENTS: No reduced diffusion to suggest acute ischemia or typical infection. No susceptibility artifact to suggest hemorrhage. The ventricles and sulci are normal for patient's age. Patchy to confluent supratentorial white matter FLAIR T2 hyperintensities. No suspicious parenchymal signal, masses, mass effect. No abnormal extra-axial fluid collections. No extra-axial masses. VASCULAR: Normal major intracranial vascular flow voids present at skull base. SKULL AND UPPER CERVICAL SPINE: No abnormal sellar expansion. No suspicious calvarial bone marrow signal. Craniocervical junction maintained. SINUSES/ORBITS: The mastoid air-cells and included paranasal sinuses are well-aerated.The included ocular globes and orbital contents are non-suspicious. OTHER: None. MRI CERVICAL SPINE FINDINGS-moderately motion degraded sagittal sequences, axial sequences are moderate to severely motion degraded. ALIGNMENT: Straightened  cervical lordosis. Grade 1 C3-4 anterolisthesis and C7-T1 anterolisthesis. VERTEBRAE/DISCS: Vertebral bodies are intact. Moderate C3-4 disc height loss with RIGHT T1, bright T2 signal within the disc, no STIR signal abnormality contiguous with LEFT facet, most compatible mineralization. Severe C5-6, C6-7 and moderate C7-T1 disc height loss, and disc desiccation compatible with degenerative discs. Moderate C5-6 and C6-7 subacute discogenic endplate changes. CORD: Linear bright STIR signal at C3-4 not localized on remaining sequences, potentially due to motion. POSTERIOR FOSSA, VERTEBRAL ARTERIES, PARASPINAL TISSUES: No MR findings of ligamentous injury. Vertebral artery flow voids present. Included posterior fossa and paraspinal soft tissues are normal. DISC LEVELS: Limited assessment due to motion. C2-3: Annular bulging, uncovertebral hypertrophy and severe RIGHT, moderate LEFT facet arthropathy. No canal stenosis or neural foraminal narrowing. C3-4:  Anterolisthesis. Small broad-based disc bulge, uncovertebral hypertrophy in severe LEFT, mild RIGHT facet arthropathy. No canal stenosis. Moderate RIGHT and severe suspected LEFT neural foraminal narrowing. C4-5: Small broad-based disc osteophyte and vertebral hypertrophy and severe LEFT and moderate RIGHT facet arthropathy. No canal stenosis. Moderate suspected LEFT neural foraminal narrowing. C5-6: Small broad-based disc bulge, uncovertebral hypertrophy and moderate LEFT, mild RIGHT facet arthropathy. No canal stenosis. Moderate suspected bilateral neural foraminal narrowing. C6-7: Small broad-based disc bulge, uncovertebral hypertrophy and mild facet arthropathy. No canal stenosis. Severe suspected neural narrowing. C7-T1: Anterolisthesis. Annular bulging and severe RIGHT moderate LEFT facet arthropathy. No canal. Mild moderate RIGHT neural foraminal narrowing. MRI THORACIC SPINE FINDINGS-moderately motion degraded examination. ALIGNMENT: Maintenance of the thoracic kyphosis. No malalignment. VERTEBRAE/DISCS: Vertebral bodies are intact. Advanced upper thoracic disc height loss with bridging bone marrow signal compatible with arthrodesis. Multilevel moderate to severe disc height loss with disc desiccation compatible with degenerative discs. T6 acute discogenic endplate changes versus Schmorl's node. No suspicious bone marrow signal. CORD: Limited assessment spinal cord due to motion, no syrinx or definite myelomalacia. PREVERTEBRAL AND PARASPINAL SOFT TISSUES:  Non suspicious. DISC LEVELS: Small T4-5 central disc protrusion. No canal stenosis. Moderate RIGHT T1-2 neural foraminal narrowing. MRI LUMBAR SPINE FINDINGS-moderately motion degraded axial sequences. SEGMENTATION: For the purposes of this report, the last well-formed intervertebral disc is reported as L5-S1. Transitional anatomy with partially sacralized L5 vertebral body, consistent with prior MRI. ALIGNMENT: Maintained lumbar lordosis. Minimal stable  grade 1 L4-5 anterolisthesis and grade 1 L5-S1 anterolisthesis. VERTEBRAE:Vertebral bodies are intact. Similar moderate L2-3 disc height loss, mild at L4-5. Diffuse disc desiccation multilevel mild chronic discogenic endplate changes. L4 inferior endplate acute Schmorl's node. Focal fat versus hemangioma RIGHT L5 pedicle. No suspicious bone marrow signal. CONUS MEDULLARIS AND CAUDA EQUINA: Conus medullaris terminates at L1 and demonstrates normal morphology and signal characteristics. Limited assessment of cauda equina due to motion. PARASPINAL AND OTHER SOFT TISSUES: Non suspicious. Severe paraspinal muscle atrophy and below the level surgical intervention. DISC LEVELS: L1-2: No disc bulge, canal stenosis nor neural foraminal narrowing. Mild facet arthropathy. L2-3: Small broad-based disc bulge. Moderate facet arthropathy and ligamentum flavum redundancy. No canal stenosis. Moderate RIGHT and mild LEFT neural foraminal narrowing. L3-4: Annular bulging. Severe facet arthropathy and ligamentum flavum redundancy. Mild canal stenosis. No neural foraminal narrowing. L4-5: Anterolisthesis. Annular bulging. Moderate to severe facet arthropathy with trace facet effusions. 9 mm RIGHT facet synovial cyst within paraspinal soft tissues. Moderate canal stenosis. Mild bilateral neural foraminal narrowing. L5-S1: Posterior decompression. Anterolisthesis. Severe facet arthropathy without canal stenosis. Mild-to-moderate bilateral neural foraminal narrowing. IMPRESSION: MRI head: 1. No acute intracranial process. 2. Stable moderate chronic small vessel ischemic changes. MRI cervical spine: 1. Motion degraded examination.  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 2. Grade 1 C3-4 and C7-T1 anterolisthesis. No acute osseous process. 3. No canal stenosis. Neural foraminal narrowing C3-4 through C7-T1: Likely severe at C3-4 and C6-7. Given degree of motion, CT cervical spine may be of added value. MRI thoracic spine: 1. Motion  degraded examination.  No fracture or malalignment. 2. Stable degenerative change of the thoracic spine. No canal. Moderate RIGHT T1-2 neural foraminal narrowing. MRI lumbar spine: 1. Motion degraded examination. 2. Stable grade 1 L4-5 and L5-S1 anterolisthesis. Status post L5 laminectomies. 3. 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 4. Stable neural foraminal narrowing L2-3, L4-5 and L5-S1: Moderate on the RIGHT at L2-3. Electronically Signed   By: Elon Alas M.D.   On: 02/21/2018 23:24   Mr Lumbar Spine Wo Contrast  Result Date: 02/21/2018 CLINICAL DATA:  Nausea, vomiting and weakness. Recent treated urinary tract infection. History of atrial fibrillation, hyperlipidemia, spinal stenosis, spine surgery. EXAM: MRI CERVICAL, THORACIC AND LUMBAR SPINE WITHOUT CONTRAST MRI HEAD WITHOUT CONTRAST TECHNIQUE: Multiplanar and multiecho pulse sequences of the cervical spine, to include the craniocervical junction and cervicothoracic junction, and thoracic and lumbar spine, were obtained without intravenous contrast. Multiplanar, multiecho pulse sequences of the brain and surrounding structures were obtained without intravenous contrast. COMPARISON:  None. MRI head November 21, 2017 and MRI thoracic spine November 12, 2016 and MRI lumbar spine April 21, 2016. FINDINGS: MRI HEAD FINDINGS INTRACRANIAL CONTENTS: No reduced diffusion to suggest acute ischemia or typical infection. No susceptibility artifact to suggest hemorrhage. The ventricles and sulci are normal for patient's age. Patchy to confluent supratentorial white matter FLAIR T2 hyperintensities. No suspicious parenchymal signal, masses, mass effect. No abnormal extra-axial fluid collections. No extra-axial masses. VASCULAR: Normal major intracranial vascular flow voids present at skull base. SKULL AND UPPER CERVICAL SPINE: No abnormal sellar expansion. No suspicious calvarial bone marrow signal. Craniocervical junction maintained.  SINUSES/ORBITS: The mastoid air-cells and included paranasal sinuses are well-aerated.The included ocular globes and orbital contents are non-suspicious. OTHER: None. MRI CERVICAL SPINE FINDINGS-moderately motion degraded sagittal sequences, axial sequences are moderate to severely motion degraded. ALIGNMENT: Straightened cervical lordosis. Grade 1 C3-4 anterolisthesis and C7-T1 anterolisthesis. VERTEBRAE/DISCS: Vertebral bodies are intact. Moderate C3-4 disc height loss with RIGHT T1, bright T2 signal within the disc, no STIR signal abnormality contiguous with LEFT facet, most compatible mineralization. Severe C5-6, C6-7 and moderate C7-T1 disc height loss, and disc desiccation compatible with degenerative discs. Moderate C5-6 and C6-7 subacute discogenic endplate changes. CORD: Linear bright STIR signal at C3-4 not localized on remaining sequences, potentially due to motion. POSTERIOR FOSSA, VERTEBRAL ARTERIES, PARASPINAL TISSUES: No MR findings of ligamentous injury. Vertebral artery flow voids present. Included posterior fossa and paraspinal soft tissues are normal. DISC LEVELS: Limited assessment due to motion. C2-3: Annular bulging, uncovertebral hypertrophy and severe RIGHT, moderate LEFT facet arthropathy. No canal stenosis or neural foraminal narrowing. C3-4: Anterolisthesis. Small broad-based disc bulge, uncovertebral hypertrophy in severe LEFT, mild RIGHT facet arthropathy. No canal stenosis. Moderate RIGHT and severe suspected LEFT neural foraminal narrowing. C4-5: Small broad-based disc osteophyte and vertebral hypertrophy and severe LEFT and moderate RIGHT facet arthropathy. No canal stenosis. Moderate suspected LEFT neural foraminal narrowing. C5-6: Small broad-based disc bulge, uncovertebral hypertrophy and moderate LEFT, mild RIGHT facet arthropathy. No canal stenosis. Moderate suspected bilateral neural foraminal narrowing. C6-7: Small broad-based disc bulge, uncovertebral hypertrophy and mild  facet arthropathy. No canal stenosis. Severe suspected neural narrowing. C7-T1: Anterolisthesis. Annular bulging and severe RIGHT moderate LEFT facet arthropathy.  No canal. Mild moderate RIGHT neural foraminal narrowing. MRI THORACIC SPINE FINDINGS-moderately motion degraded examination. ALIGNMENT: Maintenance of the thoracic kyphosis. No malalignment. VERTEBRAE/DISCS: Vertebral bodies are intact. Advanced upper thoracic disc height loss with bridging bone marrow signal compatible with arthrodesis. Multilevel moderate to severe disc height loss with disc desiccation compatible with degenerative discs. T6 acute discogenic endplate changes versus Schmorl's node. No suspicious bone marrow signal. CORD: Limited assessment spinal cord due to motion, no syrinx or definite myelomalacia. PREVERTEBRAL AND PARASPINAL SOFT TISSUES:  Non suspicious. DISC LEVELS: Small T4-5 central disc protrusion. No canal stenosis. Moderate RIGHT T1-2 neural foraminal narrowing. MRI LUMBAR SPINE FINDINGS-moderately motion degraded axial sequences. SEGMENTATION: For the purposes of this report, the last well-formed intervertebral disc is reported as L5-S1. Transitional anatomy with partially sacralized L5 vertebral body, consistent with prior MRI. ALIGNMENT: Maintained lumbar lordosis. Minimal stable grade 1 L4-5 anterolisthesis and grade 1 L5-S1 anterolisthesis. VERTEBRAE:Vertebral bodies are intact. Similar moderate L2-3 disc height loss, mild at L4-5. Diffuse disc desiccation multilevel mild chronic discogenic endplate changes. L4 inferior endplate acute Schmorl's node. Focal fat versus hemangioma RIGHT L5 pedicle. No suspicious bone marrow signal. CONUS MEDULLARIS AND CAUDA EQUINA: Conus medullaris terminates at L1 and demonstrates normal morphology and signal characteristics. Limited assessment of cauda equina due to motion. PARASPINAL AND OTHER SOFT TISSUES: Non suspicious. Severe paraspinal muscle atrophy and below the level surgical  intervention. DISC LEVELS: L1-2: No disc bulge, canal stenosis nor neural foraminal narrowing. Mild facet arthropathy. L2-3: Small broad-based disc bulge. Moderate facet arthropathy and ligamentum flavum redundancy. No canal stenosis. Moderate RIGHT and mild LEFT neural foraminal narrowing. L3-4: Annular bulging. Severe facet arthropathy and ligamentum flavum redundancy. Mild canal stenosis. No neural foraminal narrowing. L4-5: Anterolisthesis. Annular bulging. Moderate to severe facet arthropathy with trace facet effusions. 9 mm RIGHT facet synovial cyst within paraspinal soft tissues. Moderate canal stenosis. Mild bilateral neural foraminal narrowing. L5-S1: Posterior decompression. Anterolisthesis. Severe facet arthropathy without canal stenosis. Mild-to-moderate bilateral neural foraminal narrowing. IMPRESSION: MRI head: 1. No acute intracranial process. 2. Stable moderate chronic small vessel ischemic changes. MRI cervical spine: 1. Motion degraded examination. 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 2. Grade 1 C3-4 and C7-T1 anterolisthesis. No acute osseous process. 3. No canal stenosis. Neural foraminal narrowing C3-4 through C7-T1: Likely severe at C3-4 and C6-7. Given degree of motion, CT cervical spine may be of added value. MRI thoracic spine: 1. Motion degraded examination.  No fracture or malalignment. 2. Stable degenerative change of the thoracic spine. No canal. Moderate RIGHT T1-2 neural foraminal narrowing. MRI lumbar spine: 1. Motion degraded examination. 2. Stable grade 1 L4-5 and L5-S1 anterolisthesis. Status post L5 laminectomies. 3. 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 4. Stable neural foraminal narrowing L2-3, L4-5 and L5-S1: Moderate on the RIGHT at L2-3. Electronically Signed   By: Elon Alas M.D.   On: 02/21/2018 23:24   US Abdomen Complete  Result Date: 02/16/2018 CLINICAL DATA:  Nausea. EXAM: ABDOMEN ULTRASOUND COMPLETE COMPARISON:  None. FINDINGS:  Gallbladder: No gallstones or wall thickening visualized. No sonographic Murphy sign noted by sonographer. Common bile duct: Diameter: 3 mm which is within normal limits. Liver: No focal lesion identified. Increased echogenicity of hepatic parenchyma is noted. Portal vein is patent on color Doppler imaging with normal direction of blood flow towards the liver. IVC: No abnormality visualized. Pancreas: Not well visualized due to overlying bowel gas. Spleen: Size and appearance within normal limits. Right Kidney: Length: 11.1 cm. Echogenicity within normal limits. No  mass or hydronephrosis visualized. Left Kidney: Length: 12.2 cm. Two simple cysts are noted with the largest measuring 3.2 cm in lower pole. Echogenicity within normal limits. No mass or hydronephrosis visualized. Abdominal aorta: 3.1 cm infrarenal abdominal aortic aneurysm is noted. Other findings: None. IMPRESSION: Increased echogenicity of hepatic parenchyma is noted suggesting fatty infiltration or other diffuse hepatocellular disease. Two simple left renal cysts. 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 Recommend followup by ultrasound in 3 years. This recommendation follows ACR consensus guidelines: White Paper of the ACR Incidental Findings Committee II on Vascular Findings. J Am Coll Radiol 2013; 10:789-794. Electronically Signed   By: Marijo Conception, M.D.   On: 02/16/2018 11:43   Nm Hepato W/eject Fract  Result Date: 02/19/2018 CLINICAL DATA:  Nausea and vomiting 1 month. EXAM: NUCLEAR MEDICINE HEPATOBILIARY IMAGING WITH GALLBLADDER EF TECHNIQUE: Sequential images of the abdomen were obtained out to 60 minutes following intravenous administration of radiopharmaceutical. After oral ingestion of Ensure, gallbladder ejection fraction was determined. At 60 min, normal ejection fraction is greater than 33%. RADIOPHARMACEUTICALS:  5.9 mCi Tc-63m  Choletec IV COMPARISON:  Abdominal ultrasound 02/16/2018 FINDINGS: Prompt uptake and  biliary excretion of activity by the liver is seen. Gallbladder activity is visualized at 20-25 minutes, consistent with patency of cystic duct. Biliary activity passes into small bowel at 20 minutes, consistent with patent common bile duct. Calculated gallbladder ejection fraction is 56%%. (Normal gallbladder ejection fraction with Ensure is greater than 33%.) IMPRESSION: Normal HIDA scan without evidence of cholecystitis. Normal gallbladder ejection fraction. Electronically Signed   By: Marin Olp M.D.   On: 02/19/2018 14:29   Dg Chest Port 1 View  Result Date: 03/03/2018 CLINICAL DATA:  History of ablation, back pain. 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  Chest x-ray 03/01/2018. FINDINGS: Mediastinum and hilar structures are normal. Cardiomegaly. No pulmonary venous congestion. Low lung volumes with bibasilar atelectasis/infiltrates. Small left pleural effusion. No pneumothorax. IMPRESSION: 1.  Low lung volumes with mild bibasilar atelectasis. 2.  Cardiomegaly.  No pulmonary venous congestion Electronically Signed   By: Marcello Moores  Register   On: 03/03/2018 07:33   Dg Chest Portable 1 View  Result Date: 02/21/2018 CLINICAL DATA:  Nausea, vomiting and depression after being treated for urinary tract infection with Cipro. EXAM: PORTABLE CHEST 1 VIEW COMPARISON:  10/23/2017 FINDINGS: The heart size and mediastinal contours are within normal limits. Both lungs are clear. Stable mild elevation or eventration of the right hemidiaphragm. 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 IMPRESSION: No active disease. Electronically Signed   By: Ashley Royalty M.D.   On: 02/21/2018 18:15      ASSESSMENT/PLAN   Acute hypoxemic respiratory failure - likely due to atelectasis, now resolved patient on room air    Pleural effusion   -  Reviewed CXR today - improved overnight  - initial concern for hemothorax due to fall while on Eliquis  -  H/H stable no plans for intervention as per CT surgery     Bilateral atelectasis  -  Patient has incentive spirometer at bedside   - would encourage to use several times each hour  - may provide nebulizer therapy as well   Patient/Family are satisfied with Plan of action and management. All questions answered   Thank you for consultation, will sign off at this time.  Would be glad to follow up with patient at [facility] pulmonology post hospitalization , discussed with patient  he is agreeable   Ottie Glazier, MD Division of Pulmonary and Critical Care Medicine 1:49 PM 03/03/2018

## 2018-03-03 NOTE — Progress Notes (Signed)
   03/03/18 1100  Clinical Encounter Type  Visited With Patient;Family (Wife)  Visit Type Initial;Spiritual support  Recommendations Follow-up if needed  Spiritual Encounters  Spiritual Needs Emotional  Stress Factors  Patient Stress Factors Health changes   Initial visit to introduce the patient to pastoral care. Patient and wife were receptive, but expressed no pressing spiritual or emotional needs at this time. Patient discussed his recent history of falls, but did not seem unduely concerned. Chaplain offered to return if needed.

## 2018-03-03 NOTE — Progress Notes (Signed)
[facility] NAME: Mark [name]    MR#:  [id number]  DATE OF BIRTH:  1940/04/25  SUBJECTIVE: Admitted for fall and found to have pleural effusion, seen by Dr. CT surgery Dr. Graciela Husbands, this morning, patient not hypoxic on room air, complains of pleuritic chest pain but otherwise denies any complaints.  Chest x-ray this morning showed small pleural effusion, recommended no further intervention.  CHIEF COMPLAINT:   Chief Complaint  Patient presents with  . Fall   Patient main complaint is pleuritic chest pain.  Unable to use in incentive spirometry because of pain. REVIEW OF SYSTEMS:   ROS CONSTITUTIONAL: Low-grade temperature this morning.Marland Kitchen  EYES: No blurred or double vision.  EARS, NOSE, AND THROAT: No tinnitus or ear pain.  RESPIRATORY: No cough, shortness of breath, wheezing or hemoptysis.  CARDIOVASCULAR:  pleuritic chest pain, mild shortness of breath GASTROINTESTINAL: No nausea, vomiting, diarrhea or abdominal pain.  GENITOURINARY: No dysuria, hematuria.  ENDOCRINE: No polyuria, nocturia,  HEMATOLOGY: No anemia, easy bruising or bleeding SKIN: No rash or lesion. MUSCULOSKELETAL: No joint pain or arthritis.   NEUROLOGIC: No tingling, numbness, weakness.  PSYCHIATRY: No anxiety or depression.   DRUG ALLERGIES:   Allergies  Allergen Reactions  . Erythromycin Swelling  . Keflex [Cephalexin] Swelling  . Penicillins Swelling    Has patient had a PCN reaction causing immediate rash, facial/tongue/throat swelling, SOB or lightheadedness with hypotension: Yes Has patient had a PCN reaction causing severe rash involving mucus membranes or skin necrosis: No Has patient had a PCN reaction that required hospitalization: Unknown Has patient had a PCN reaction occurring within the last 10 years: No If all of the above answers are "NO", then may proceed with Cephalosporin use.     VITALS:  Blood pressure (!) 141/76, pulse  74, temperature 99.5 F (37.5 C), temperature source Oral, resp. rate 18, height 6\' 4"  (1.93 m), weight 99.8 kg, SpO2 92 %.  PHYSICAL EXAMINATION:  GENERAL:  77 y.o.-year-old patient lying in the bed with no acute distress.  EYES: Pupils equal, round, reactive to light and accommodation. No scleral icterus. Extraocular muscles intact.  HEENT: Head atraumatic, normocephalic. Oropharynx and nasopharynx clear.  NECK:  Supple, no jugular venous distention. No thyroid enlargement, no tenderness.  LUNGS: Diminished air entry at bases.  CARDIOVASCULAR: S1, S2 normal. No murmurs, rubs, or gallops.  ABDOMEN: Soft, nontender, nondistended. Bowel sounds present. No organomegaly or mass.  EXTREMITIES: No pedal edema, cyanosis, or clubbing.  NEUROLOGIC: Cranial nerves II through XII are intact. Muscle strength 5/5 in all extremities. Sensation intact. Gait not checked.  PSYCHIATRIC: The patient is alert and oriented x 3.  SKIN: No obvious rash, lesion, or ulcer.    LABORATORY PANEL:   CBC Recent Labs  Lab 03/02/18 0329  WBC 11.2*  HGB 15.6  HCT 47.7  PLT 237   ------------------------------------------------------------------------------------------------------------------  Chemistries  Recent Labs  Lab 03/01/18 2311 03/02/18 0329  NA 137 137  K 4.1 4.0  CL 106 107  CO2 22 22  GLUCOSE 143* 135*  BUN 20 21  CREATININE 0.95 1.03  CALCIUM 9.2 9.0  AST 31  --   ALT 37  --   ALKPHOS 62  --   BILITOT 0.7  --    ------------------------------------------------------------------------------------------------------------------  Cardiac Enzymes No results for input(s): TROPONINI in the last 168 hours. ------------------------------------------------------------------------------------------------------------------  RADIOLOGY:  Dg Chest 2 View  Result Date: 03/01/2018 CLINICAL DATA:  77 y/o  M;  right rib pain after fall. EXAM: CHEST - 2 VIEW COMPARISON:  02/21/2018 chest radiograph  FINDINGS: Stable heart size and mediastinal contours are within normal limits. Both lungs are clear. No acute osseous abnormality identified. IMPRESSION: 1. No active cardiopulmonary disease. 2. No acute osseous abnormality identified. Consider dedicated rib radiographs if there is focal tenderness. Electronically Signed   By: Kristine Garbe M.D.   On: 03/01/2018 22:33   Ct Chest Wo Contrast  Result Date: 03/02/2018 CLINICAL DATA:  Right lateral chest pain following a fall. No history of smoking. EXAM: CT CHEST WITHOUT CONTRAST TECHNIQUE: Multidetector CT imaging of the chest was performed following the standard protocol without IV contrast. COMPARISON:  Chest radiographs obtained earlier this evening. Chest, abdomen and pelvis CT dated 01/09/2005. FINDINGS: Cardiovascular: Atheromatous calcifications, including the coronary arteries and aorta. Borderline enlarged heart. Mediastinum/Nodes: No enlarged lymph nodes. Enlarged lateral lobe scratch the enlarged left lobe of the thyroid gland containing small densely calcified nodules. Small to moderate-sized hiatal hernia. Lungs/Pleura: Interval small amount of linear scarring or subsegmental atelectasis in the left upper lobe. Interval 4 mm average diameter nodule in the superior segment of the right lower lobe on image number 65 series 3. Interval small to moderate-sized right pleural effusion with a medium density posterior component measuring 40 Hounsfield units in density and a lower density anterior component measuring 18 Hounsfield units in density. No pneumothorax. Upper Abdomen: Stable small cystic area in the lateral segment of the left lobe of the liver. Musculoskeletal: Thoracic and lower cervical spine degenerative changes. No fractures are seen. IMPRESSION: 1. Interval small to moderate-sized right pleural effusion with medium and lower density components. This could represent a hemothorax or effusion with proteinaceous fluid. 2. Interval 4  mm average diameter nodule in the superior segment of the right lower lobe. This is too small to characterize, but most likely benign in the absence of known clinical risk factors for lung cancer. No follow-up needed if patient is low-risk. Non-contrast chest CT can be considered in 12 months if patient is high-risk. This recommendation follows the consensus statement: Guidelines for Management of Incidental Pulmonary Nodules Detected on CT Images: From the Fleischner Society 2017; Radiology 2017; 284:228-243. 3. Interval small amount of linear scarring or subsegmental atelectasis in the left upper lobe. 4.  Calcific coronary artery and aortic atherosclerosis. 5. Small to moderate-sized hiatal hernia. 6. Multinodular goiter. Aortic Atherosclerosis (ICD10-I70.0). Electronically Signed   By: Claudie Revering M.D.   On: 03/02/2018 00:58   Dg Chest Port 1 View  Result Date: 03/03/2018 CLINICAL DATA:  History of ablation, back pain. 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  Chest x-ray 03/01/2018. FINDINGS: Mediastinum and hilar structures are normal. Cardiomegaly. No pulmonary venous congestion. Low lung volumes with bibasilar atelectasis/infiltrates. Small left pleural effusion. No pneumothorax. IMPRESSION: 1.  Low lung volumes with mild bibasilar atelectasis. 2.  Cardiomegaly.  No pulmonary venous congestion Electronically Signed   By: Marcello Moores  Register   On: 03/03/2018 07:33    EKG:   Orders placed or performed during the hospital encounter of 02/21/18  . EKG 12-Lead  . EKG 12-Lead  . ED EKG  . ED EKG  . EKG    ASSESSMENT AND PLAN:   77 year old male patient with history of spinal stenosis, chronic A. fib, hyperlipidemia had been on 10 December because of fall, found to have right pleural effusion. #1. right sided hemothorax: Secondary to mechanical fall at home, initial oxygen  saturation 92% on 2 L, improved now he is maintaining 92% on room air, seen  by pulmonology, CT surgery, Dr. Graciela Husbands reviewed, CT chest results, because there is a small pleural effusion, did not recommend any further intervention like chest tube drainage.  Patient has right pleural effusion due to fall did not recommend any chest tube.  He feels like he has pleuritic chest pain but otherwise feels okay.  Chest x-ray this morning, reviewed with Dr. Graciela Husbands, patient has no further pleural effusion has bibasilar atelectasis, recommended symptom spirometry.  Patient has low-grade temperature so monitor for fever curve, check ambulatory oxygen saturation to see if he qualifies for home oxygen, wife wanted him to stay today because of his fever and also concern for possible need for home oxygen.  Advised patient to use incentive spirometer as much as possible. #2. fall at home, patient is getting evaluated for Parkinson disease, does have appointment with neurologist tomorrow at around 8:30 AM.  Possible discharge early tomorrow and see if wife can take him for that appointment otherwise she told me that he has to wait till January.  #3 chronic A. Fib,.  Restart apixaban today. 4.  Factor V Leiden mutation,;, history of PE before: Continue apixaban. 5.  History of severe spinal stenosis.  Status post laminectomy, seen by physical therapy recommended home health physical therapy, possible discharge home tomorrow. All the records are reviewed and case discussed with Care Management/Social Workerr. Management plans discussed with the patient, family and they are in agreement.  CODE STATUS: full  TOTAL TIME TAKING CARE OF THIS PATIENT: 40  minutes.   POSSIBLE D/C IN 1-2 DAYS, DEPENDING ON CLINICAL CONDITION. Than 50% time spent in counseling, coordination of care  Epifanio Lesches M.D on 03/03/2018 at 10:23 AM  Between 7am to 6pm - Pager - [phone]  After 6pm go to [email or web address] - password [email or web address] [facility]  Office   [phone]  CC: Primary care physician; Tracie Harrier, MD   Note: This dictation was prepared with Dragon dictation along with smaller phrase technology. Any transcriptional errors that result from this process are unintentional.

## 2018-03-04 NOTE — Progress Notes (Signed)
Discharge order received. Patient is alert and oriented. Vital signs stable . No signs of acute distress. Discharge instructions given. Patient verbalized understanding. No other issues noted at this time.   

## 2018-03-04 NOTE — Care Management Note (Signed)
Case Management Note  Patient Details  Name: Mark [name]. MRN: [id number] Date of Birth: 23-Jul-1940   Patient discharged home.  Corene Cornea with [facility] notified of discharge and resumption orders.   Subjective/Objective:                    Action/Plan:   Expected Discharge Date:  03/04/18               Expected Discharge Plan:  [place]  In-House Referral:     Discharge planning Services  CM Consult  Post Acute Care Choice:  Resumption of Svcs/PTA Provider Choice offered to:     DME Arranged:    DME Agency:     HH Arranged:  RN, PT, OT HH Agency:  [facility]  Status of Service:  Completed, signed off  If discussed at [place] of Stay Meetings, dates discussed:    Additional Comments:  Beverly Sessions, RN 03/04/2018, 10:21 AM

## 2018-03-07 NOTE — Discharge Summary (Signed)
Mark Mancinas., is a 77 y.o. male  DOB Mar 16, 1941  MRN [id number].  Admission date:  03/01/2018  Admitting Physician  Amelia Jo, MD  Discharge Date:  03/04/2018   Primary MD  Tracie Harrier, MD  Recommendations for primary care physician for things to follow:   Follow-up with PCP in 1 week Patient has appointment with neurologist at 8:30 AM with Dr. Melrose Nakayama  Admission Diagnosis  fall   Discharge Diagnosis  fall   Active Problems:   Fall   Pleural effusion      Past Medical History:  Diagnosis Date  . Anxiety   . Atrial fibrillation ([place])   . Back pain, chronic   . Colon polyp   . Cyst of left kidney   . GERD (gastroesophageal reflux disease)   . Heart murmur   . Hyperlipemia   . Mitral valve prolapse   . Neuromuscular disorder ([place])   . Neuropathy   . Osteoarthritis   . Pulmonary emboli ([place])   . Spinal stenosis   . Thyroid nodule   . Vertigo     Past Surgical History:  Procedure Laterality Date  . APPENDECTOMY    . BACK SURGERY    . COLONOSCOPY N/A 08/15/2014   Procedure: COLONOSCOPY;  Surgeon: [name], MD;  Location: [facility] ENDOSCOPY;  Service: Endoscopy;  Laterality: N/A;  . ESOPHAGOGASTRODUODENOSCOPY N/A 08/15/2014   Procedure: ESOPHAGOGASTRODUODENOSCOPY (EGD);  Surgeon: [name], MD;  Location: [facility] ENDOSCOPY;  Service: Endoscopy;  Laterality: N/A;  . 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   Hospital Course   #1. right-sided pneumothorax secondary to fall, patient initial O2 sat 92% on 2 L but improved.  Incentive spirometer, pain medicines, patient seen by pulmonary, CT surgery, patient CT chest showed only minimal hemothorax without any other hypoxia so Dr. Faith Rogue said patient does not need chest tube.  Patient hypoxia resolved, on room air sats were more than 90%, still has right-sided pleuritic chest pain at discharge, so we gave him prescription for Vicodin. 2.  Weakness, loss of balance: According to wife patient has been having weakness, loss of balance for almost 2 to 3 weeks, patient lost her balance and fell this time.  Medications have been changed to depression including Wellbutrin., , patient is advised to follow-up with neurologist Dr. Melrose Nakayama regarding primary diagnosis of Parkinson disease as per patient's wife. 3.  BPH: Continue Flomax, finasteride.    Discharge Condition: Stable   Follow UP  Follow-up Information    Tracie Harrier, MD. Go on 03/10/2018.   Specialty:  Internal Medicine Why:  Grayland Ormond, PA, Thursday, 12/19 at 11 a.m.  [phone] Contact information: [address] [place] [address] [phone]             Discharge Instructions  and  Discharge Medications      Allergies as of 03/04/2018      Reactions   Erythromycin Swelling  Keflex [cephalexin] Swelling   Penicillins Swelling   Has patient had a PCN reaction causing immediate rash, facial/tongue/throat swelling, SOB or lightheadedness with hypotension: Yes Has patient had a PCN reaction causing severe rash involving mucus membranes or skin necrosis: No Has patient had a PCN reaction that required hospitalization: Unknown Has patient had a PCN reaction occurring within the last 10 years: No If all  of the above answers are "NO", then may proceed with Cephalosporin use.      Medication List    STOP taking these medications   buPROPion 75 MG tablet Commonly known as:  WELLBUTRIN     TAKE these medications   B-12 5000 MCG Caps Take 5,000 mg by mouth daily. What changed:  how much to take   clotrimazole-betamethasone cream Commonly known as:  LOTRISONE Apply 1 application topically 2 (two) times daily.   CO Q 10 PO Take 1 Dose by mouth daily.   ELIQUIS 2.5 MG Tabs tablet Generic drug:  apixaban Take 2.5 mg by mouth 2 (two) times daily.   esomeprazole 40 MG capsule Commonly known as:  NEXIUM Take 40 mg by mouth daily.   feeding supplement (ENSURE ENLIVE) Liqd Take 237 mLs by mouth 2 (two) times daily between meals.   finasteride 5 MG tablet Commonly known as:  PROSCAR Take 5 mg by mouth daily.   HYDROcodone-acetaminophen 5-325 MG tablet Commonly known as:  NORCO/VICODIN Take 1-2 tablets by mouth every 4 (four) hours as needed for moderate pain.   multivitamin with minerals Tabs tablet Take 1 tablet by mouth daily.   ondansetron 4 MG disintegrating tablet Commonly known as:  ZOFRAN-ODT Take 4 mg by mouth every 8 (eight) hours as needed.   PROZAC 20 MG capsule Generic drug:  FLUoxetine Take 20 mg by mouth daily. What changed:  Another medication with the same name was removed. Continue taking this medication, and follow the directions you see here.   rOPINIRole 0.5 MG tablet Commonly known as:  REQUIP Take 1 tablet by mouth at bedtime.   simvastatin 40 MG tablet Commonly known as:  ZOCOR Take 40 mg by mouth at bedtime.   tamsulosin 0.4 MG Caps capsule Commonly known as:  FLOMAX Take 0.4 mg by mouth daily.         Diet and Activity recommendation: See Discharge Instructions above   Consults obtained -pulmonary, CT surgery   Major procedures and Radiology Reports - PLEASE review detailed and final reports for all details, in brief -     Dg  Chest 2 View  Result Date: 03/01/2018 CLINICAL DATA:  77 y/o  M; right rib pain after fall. EXAM: CHEST - 2 VIEW COMPARISON:  02/21/2018 chest radiograph FINDINGS: Stable heart size and mediastinal contours are within normal limits. Both lungs are clear. No acute osseous abnormality identified. IMPRESSION: 1. No active cardiopulmonary disease. 2. No acute osseous abnormality identified. Consider dedicated rib radiographs if there is focal tenderness. Electronically Signed   By: Kristine Garbe M.D.   On: 03/01/2018 22:33   Ct Chest Wo Contrast  Result Date: 03/02/2018 CLINICAL DATA:  Right lateral chest pain following a fall. No history of smoking. EXAM: CT CHEST WITHOUT CONTRAST TECHNIQUE: Multidetector CT imaging of the chest was performed following the standard protocol without IV contrast. COMPARISON:  Chest radiographs obtained earlier this evening. Chest, abdomen and pelvis CT dated 01/09/2005. FINDINGS: Cardiovascular: Atheromatous calcifications, including the coronary arteries and aorta. Borderline enlarged heart. Mediastinum/Nodes: No enlarged lymph nodes. Enlarged lateral lobe scratch  the enlarged left lobe of the thyroid gland containing small densely calcified nodules. Small to moderate-sized hiatal hernia. Lungs/Pleura: Interval small amount of linear scarring or subsegmental atelectasis in the left upper lobe. Interval 4 mm average diameter nodule in the superior segment of the right lower lobe on image number 65 series 3. Interval small to moderate-sized right pleural effusion with a medium density posterior component measuring 40 Hounsfield units in density and a lower density anterior component measuring 18 Hounsfield units in density. No pneumothorax. Upper Abdomen: Stable small cystic area in the lateral segment of the left lobe of the liver. Musculoskeletal: Thoracic and lower cervical spine degenerative changes. No fractures are seen. IMPRESSION: 1. Interval small to  moderate-sized right pleural effusion with medium and lower density components. This could represent a hemothorax or effusion with proteinaceous fluid. 2. Interval 4 mm average diameter nodule in the superior segment of the right lower lobe. This is too small to characterize, but most likely benign in the absence of known clinical risk factors for lung cancer. No follow-up needed if patient is low-risk. Non-contrast chest CT can be considered in 12 months if patient is high-risk. This recommendation follows the consensus statement: Guidelines for Management of Incidental Pulmonary Nodules Detected on CT Images: From the Fleischner Society 2017; Radiology 2017; 284:228-243. 3. Interval small amount of linear scarring or subsegmental atelectasis in the left upper lobe. 4.  Calcific coronary artery and aortic atherosclerosis. 5. Small to moderate-sized hiatal hernia. 6. Multinodular goiter. Aortic Atherosclerosis (ICD10-I70.0). Electronically Signed   By: Claudie Revering M.D.   On: 03/02/2018 00:58   Mr Brain Wo Contrast  Result Date: 02/21/2018 CLINICAL DATA:  Nausea, vomiting and weakness. Recent treated urinary tract infection. History of atrial fibrillation, hyperlipidemia, spinal stenosis, spine surgery. EXAM: MRI CERVICAL, THORACIC AND LUMBAR SPINE WITHOUT CONTRAST MRI HEAD WITHOUT CONTRAST TECHNIQUE: Multiplanar and multiecho pulse sequences of the cervical spine, to include the craniocervical junction and cervicothoracic junction, and thoracic and lumbar spine, were obtained without intravenous contrast. Multiplanar, multiecho pulse sequences of the brain and surrounding structures were obtained without intravenous contrast. COMPARISON:  None. MRI head November 21, 2017 and MRI thoracic spine November 12, 2016 and MRI lumbar spine April 21, 2016. FINDINGS: MRI HEAD FINDINGS INTRACRANIAL CONTENTS: No reduced diffusion to suggest acute ischemia or typical infection. No susceptibility artifact to suggest  hemorrhage. The ventricles and sulci are normal for patient's age. Patchy to confluent supratentorial white matter FLAIR T2 hyperintensities. No suspicious parenchymal signal, masses, mass effect. No abnormal extra-axial fluid collections. No extra-axial masses. VASCULAR: Normal major intracranial vascular flow voids present at skull base. SKULL AND UPPER CERVICAL SPINE: No abnormal sellar expansion. No suspicious calvarial bone marrow signal. Craniocervical junction maintained. SINUSES/ORBITS: The mastoid air-cells and included paranasal sinuses are well-aerated.The included ocular globes and orbital contents are non-suspicious. OTHER: None. MRI CERVICAL SPINE FINDINGS-moderately motion degraded sagittal sequences, axial sequences are moderate to severely motion degraded. ALIGNMENT: Straightened cervical lordosis. Grade 1 C3-4 anterolisthesis and C7-T1 anterolisthesis. VERTEBRAE/DISCS: Vertebral bodies are intact. Moderate C3-4 disc height loss with RIGHT T1, bright T2 signal within the disc, no STIR signal abnormality contiguous with LEFT facet, most compatible mineralization. Severe C5-6, C6-7 and moderate C7-T1 disc height loss, and disc desiccation compatible with degenerative discs. Moderate C5-6 and C6-7 subacute discogenic endplate changes. CORD: Linear bright STIR signal at C3-4 not localized on remaining sequences, potentially due to motion. POSTERIOR FOSSA, VERTEBRAL ARTERIES, PARASPINAL TISSUES: No MR findings of ligamentous injury. Vertebral artery flow voids  present. Included posterior fossa and paraspinal soft tissues are normal. DISC LEVELS: Limited assessment due to motion. C2-3: Annular bulging, uncovertebral hypertrophy and severe RIGHT, moderate LEFT facet arthropathy. No canal stenosis or neural foraminal narrowing. C3-4: Anterolisthesis. Small broad-based disc bulge, uncovertebral hypertrophy in severe LEFT, mild RIGHT facet arthropathy. No canal stenosis. Moderate RIGHT and severe suspected  LEFT neural foraminal narrowing. C4-5: Small broad-based disc osteophyte and vertebral hypertrophy and severe LEFT and moderate RIGHT facet arthropathy. No canal stenosis. Moderate suspected LEFT neural foraminal narrowing. C5-6: Small broad-based disc bulge, uncovertebral hypertrophy and moderate LEFT, mild RIGHT facet arthropathy. No canal stenosis. Moderate suspected bilateral neural foraminal narrowing. C6-7: Small broad-based disc bulge, uncovertebral hypertrophy and mild facet arthropathy. No canal stenosis. Severe suspected neural narrowing. C7-T1: Anterolisthesis. Annular bulging and severe RIGHT moderate LEFT facet arthropathy. No canal. Mild moderate RIGHT neural foraminal narrowing. MRI THORACIC SPINE FINDINGS-moderately motion degraded examination. ALIGNMENT: Maintenance of the thoracic kyphosis. No malalignment. VERTEBRAE/DISCS: Vertebral bodies are intact. Advanced upper thoracic disc height loss with bridging bone marrow signal compatible with arthrodesis. Multilevel moderate to severe disc height loss with disc desiccation compatible with degenerative discs. T6 acute discogenic endplate changes versus Schmorl's node. No suspicious bone marrow signal. CORD: Limited assessment spinal cord due to motion, no syrinx or definite myelomalacia. PREVERTEBRAL AND PARASPINAL SOFT TISSUES:  Non suspicious. DISC LEVELS: Small T4-5 central disc protrusion. No canal stenosis. Moderate RIGHT T1-2 neural foraminal narrowing. MRI LUMBAR SPINE FINDINGS-moderately motion degraded axial sequences. SEGMENTATION: For the purposes of this report, the last well-formed intervertebral disc is reported as L5-S1. Transitional anatomy with partially sacralized L5 vertebral body, consistent with prior MRI. ALIGNMENT: Maintained lumbar lordosis. Minimal stable grade 1 L4-5 anterolisthesis and grade 1 L5-S1 anterolisthesis. VERTEBRAE:Vertebral bodies are intact. Similar moderate L2-3 disc height loss, mild at L4-5. Diffuse disc  desiccation multilevel mild chronic discogenic endplate changes. L4 inferior endplate acute Schmorl's node. Focal fat versus hemangioma RIGHT L5 pedicle. No suspicious bone marrow signal. CONUS MEDULLARIS AND CAUDA EQUINA: Conus medullaris terminates at L1 and demonstrates normal morphology and signal characteristics. Limited assessment of cauda equina due to motion. PARASPINAL AND OTHER SOFT TISSUES: Non suspicious. Severe paraspinal muscle atrophy and below the level surgical intervention. DISC LEVELS: L1-2: No disc bulge, canal stenosis nor neural foraminal narrowing. Mild facet arthropathy. L2-3: Small broad-based disc bulge. Moderate facet arthropathy and ligamentum flavum redundancy. No canal stenosis. Moderate RIGHT and mild LEFT neural foraminal narrowing. L3-4: Annular bulging. Severe facet arthropathy and ligamentum flavum redundancy. Mild canal stenosis. No neural foraminal narrowing. L4-5: Anterolisthesis. Annular bulging. Moderate to severe facet arthropathy with trace facet effusions. 9 mm RIGHT facet synovial cyst within paraspinal soft tissues. Moderate canal stenosis. Mild bilateral neural foraminal narrowing. L5-S1: Posterior decompression. Anterolisthesis. Severe facet arthropathy without canal stenosis. Mild-to-moderate bilateral neural foraminal narrowing. IMPRESSION: MRI head: 1. No acute intracranial process. 2. Stable moderate chronic small vessel ischemic changes. MRI cervical spine: 1. Motion degraded examination. 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 2. Grade 1 C3-4 and C7-T1 anterolisthesis. No acute osseous process. 3. No canal stenosis. Neural foraminal narrowing C3-4 through C7-T1: Likely severe at C3-4 and C6-7. Given degree of motion, CT cervical spine may be of added value. MRI thoracic spine: 1. Motion degraded examination.  No fracture or malalignment. 2. Stable degenerative change of the thoracic spine. No canal. Moderate RIGHT T1-2 neural foraminal narrowing. MRI lumbar  spine: 1. Motion degraded examination. 2. Stable grade 1 L4-5 and L5-S1 anterolisthesis. Status post L5 laminectomies. 3. Stable  moderate canal stenosis L4-5, mild at L3-4. 4. Stable neural foraminal narrowing L2-3, L4-5 and L5-S1: Moderate on the RIGHT at L2-3. Electronically Signed   By: Elon Alas M.D.   On: 02/21/2018 23:24   Mr Cervical Spine Wo Contrast  Result Date: 02/21/2018 CLINICAL DATA:  Nausea, vomiting and weakness. Recent treated urinary tract infection. History of atrial fibrillation, hyperlipidemia, spinal stenosis, spine surgery. EXAM: MRI CERVICAL, THORACIC AND LUMBAR SPINE WITHOUT CONTRAST MRI HEAD WITHOUT CONTRAST TECHNIQUE: Multiplanar and multiecho pulse sequences of the cervical spine, to include the craniocervical junction and cervicothoracic junction, and thoracic and lumbar spine, were obtained without intravenous contrast. Multiplanar, multiecho pulse sequences of the brain and surrounding structures were obtained without intravenous contrast. COMPARISON:  None. MRI head November 21, 2017 and MRI thoracic spine November 12, 2016 and MRI lumbar spine April 21, 2016. FINDINGS: MRI HEAD FINDINGS INTRACRANIAL CONTENTS: No reduced diffusion to suggest acute ischemia or typical infection. No susceptibility artifact to suggest hemorrhage. The ventricles and sulci are normal for patient's age. Patchy to confluent supratentorial white matter FLAIR T2 hyperintensities. No suspicious parenchymal signal, masses, mass effect. No abnormal extra-axial fluid collections. No extra-axial masses. VASCULAR: Normal major intracranial vascular flow voids present at skull base. SKULL AND UPPER CERVICAL SPINE: No abnormal sellar expansion. No suspicious calvarial bone marrow signal. Craniocervical junction maintained. SINUSES/ORBITS: The mastoid air-cells and included paranasal sinuses are well-aerated.The included ocular globes and orbital contents are non-suspicious. OTHER: None. MRI CERVICAL SPINE  FINDINGS-moderately motion degraded sagittal sequences, axial sequences are moderate to severely motion degraded. ALIGNMENT: Straightened cervical lordosis. Grade 1 C3-4 anterolisthesis and C7-T1 anterolisthesis. VERTEBRAE/DISCS: Vertebral bodies are intact. Moderate C3-4 disc height loss with RIGHT T1, bright T2 signal within the disc, no STIR signal abnormality contiguous with LEFT facet, most compatible mineralization. Severe C5-6, C6-7 and moderate C7-T1 disc height loss, and disc desiccation compatible with degenerative discs. Moderate C5-6 and C6-7 subacute discogenic endplate changes. CORD: Linear bright STIR signal at C3-4 not localized on remaining sequences, potentially due to motion. POSTERIOR FOSSA, VERTEBRAL ARTERIES, PARASPINAL TISSUES: No MR findings of ligamentous injury. Vertebral artery flow voids present. Included posterior fossa and paraspinal soft tissues are normal. DISC LEVELS: Limited assessment due to motion. C2-3: Annular bulging, uncovertebral hypertrophy and severe RIGHT, moderate LEFT facet arthropathy. No canal stenosis or neural foraminal narrowing. C3-4: Anterolisthesis. Small broad-based disc bulge, uncovertebral hypertrophy in severe LEFT, mild RIGHT facet arthropathy. No canal stenosis. Moderate RIGHT and severe suspected LEFT neural foraminal narrowing. C4-5: Small broad-based disc osteophyte and vertebral hypertrophy and severe LEFT and moderate RIGHT facet arthropathy. No canal stenosis. Moderate suspected LEFT neural foraminal narrowing. C5-6: Small broad-based disc bulge, uncovertebral hypertrophy and moderate LEFT, mild RIGHT facet arthropathy. No canal stenosis. Moderate suspected bilateral neural foraminal narrowing. C6-7: Small broad-based disc bulge, uncovertebral hypertrophy and mild facet arthropathy. No canal stenosis. Severe suspected neural narrowing. C7-T1: Anterolisthesis. Annular bulging and severe RIGHT moderate LEFT facet arthropathy. No canal. Mild moderate  RIGHT neural foraminal narrowing. MRI THORACIC SPINE FINDINGS-moderately motion degraded examination. ALIGNMENT: Maintenance of the thoracic kyphosis. No malalignment. VERTEBRAE/DISCS: Vertebral bodies are intact. Advanced upper thoracic disc height loss with bridging bone marrow signal compatible with arthrodesis. Multilevel moderate to severe disc height loss with disc desiccation compatible with degenerative discs. T6 acute discogenic endplate changes versus Schmorl's node. No suspicious bone marrow signal. CORD: Limited assessment spinal cord due to motion, no syrinx or definite myelomalacia. PREVERTEBRAL AND PARASPINAL SOFT TISSUES:  Non suspicious. DISC LEVELS: Small T4-5 central  disc protrusion. No canal stenosis. Moderate RIGHT T1-2 neural foraminal narrowing. MRI LUMBAR SPINE FINDINGS-moderately motion degraded axial sequences. SEGMENTATION: For the purposes of this report, the last well-formed intervertebral disc is reported as L5-S1. Transitional anatomy with partially sacralized L5 vertebral body, consistent with prior MRI. ALIGNMENT: Maintained lumbar lordosis. Minimal stable grade 1 L4-5 anterolisthesis and grade 1 L5-S1 anterolisthesis. VERTEBRAE:Vertebral bodies are intact. Similar moderate L2-3 disc height loss, mild at L4-5. Diffuse disc desiccation multilevel mild chronic discogenic endplate changes. L4 inferior endplate acute Schmorl's node. Focal fat versus hemangioma RIGHT L5 pedicle. No suspicious bone marrow signal. CONUS MEDULLARIS AND CAUDA EQUINA: Conus medullaris terminates at L1 and demonstrates normal morphology and signal characteristics. Limited assessment of cauda equina due to motion. PARASPINAL AND OTHER SOFT TISSUES: Non suspicious. Severe paraspinal muscle atrophy and below the level surgical intervention. DISC LEVELS: L1-2: No disc bulge, canal stenosis nor neural foraminal narrowing. Mild facet arthropathy. L2-3: Small broad-based disc bulge. Moderate facet arthropathy and  ligamentum flavum redundancy. No canal stenosis. Moderate RIGHT and mild LEFT neural foraminal narrowing. L3-4: Annular bulging. Severe facet arthropathy and ligamentum flavum redundancy. Mild canal stenosis. No neural foraminal narrowing. L4-5: Anterolisthesis. Annular bulging. Moderate to severe facet arthropathy with trace facet effusions. 9 mm RIGHT facet synovial cyst within paraspinal soft tissues. Moderate canal stenosis. Mild bilateral neural foraminal narrowing. L5-S1: Posterior decompression. Anterolisthesis. Severe facet arthropathy without canal stenosis. Mild-to-moderate bilateral neural foraminal narrowing. IMPRESSION: MRI head: 1. No acute intracranial process. 2. Stable moderate chronic small vessel ischemic changes. MRI cervical spine: 1. Motion degraded examination. 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 2. Grade 1 C3-4 and C7-T1 anterolisthesis. No acute osseous process. 3. No canal stenosis. Neural foraminal narrowing C3-4 through C7-T1: Likely severe at C3-4 and C6-7. Given degree of motion, CT cervical spine may be of added value. MRI thoracic spine: 1. Motion degraded examination.  No fracture or malalignment. 2. Stable degenerative change of the thoracic spine. No canal. Moderate RIGHT T1-2 neural foraminal narrowing. MRI lumbar spine: 1. Motion degraded examination. 2. Stable grade 1 L4-5 and L5-S1 anterolisthesis. Status post L5 laminectomies. 3. 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 4. Stable neural foraminal narrowing L2-3, L4-5 and L5-S1: Moderate on the RIGHT at L2-3. Electronically Signed   By: Elon Alas M.D.   On: 02/21/2018 23:24   Mr Thoracic Spine Wo Contrast  Result Date: 02/21/2018 CLINICAL DATA:  Nausea, vomiting and weakness. Recent treated urinary tract infection. History of atrial fibrillation, hyperlipidemia, spinal stenosis, spine surgery. EXAM: MRI CERVICAL, THORACIC AND LUMBAR SPINE WITHOUT CONTRAST MRI HEAD WITHOUT CONTRAST TECHNIQUE:  Multiplanar and multiecho pulse sequences of the cervical spine, to include the craniocervical junction and cervicothoracic junction, and thoracic and lumbar spine, were obtained without intravenous contrast. Multiplanar, multiecho pulse sequences of the brain and surrounding structures were obtained without intravenous contrast. COMPARISON:  None. MRI head November 21, 2017 and MRI thoracic spine November 12, 2016 and MRI lumbar spine April 21, 2016. FINDINGS: MRI HEAD FINDINGS INTRACRANIAL CONTENTS: No reduced diffusion to suggest acute ischemia or typical infection. No susceptibility artifact to suggest hemorrhage. The ventricles and sulci are normal for patient's age. Patchy to confluent supratentorial white matter FLAIR T2 hyperintensities. No suspicious parenchymal signal, masses, mass effect. No abnormal extra-axial fluid collections. No extra-axial masses. VASCULAR: Normal major intracranial vascular flow voids present at skull base. SKULL AND UPPER CERVICAL SPINE: No abnormal sellar expansion. No suspicious calvarial bone marrow signal. Craniocervical junction maintained. SINUSES/ORBITS: The mastoid air-cells and included  paranasal sinuses are well-aerated.The included ocular globes and orbital contents are non-suspicious. OTHER: None. MRI CERVICAL SPINE FINDINGS-moderately motion degraded sagittal sequences, axial sequences are moderate to severely motion degraded. ALIGNMENT: Straightened cervical lordosis. Grade 1 C3-4 anterolisthesis and C7-T1 anterolisthesis. VERTEBRAE/DISCS: Vertebral bodies are intact. Moderate C3-4 disc height loss with RIGHT T1, bright T2 signal within the disc, no STIR signal abnormality contiguous with LEFT facet, most compatible mineralization. Severe C5-6, C6-7 and moderate C7-T1 disc height loss, and disc desiccation compatible with degenerative discs. Moderate C5-6 and C6-7 subacute discogenic endplate changes. CORD: Linear bright STIR signal at C3-4 not localized on remaining  sequences, potentially due to motion. POSTERIOR FOSSA, VERTEBRAL ARTERIES, PARASPINAL TISSUES: No MR findings of ligamentous injury. Vertebral artery flow voids present. Included posterior fossa and paraspinal soft tissues are normal. DISC LEVELS: Limited assessment due to motion. C2-3: Annular bulging, uncovertebral hypertrophy and severe RIGHT, moderate LEFT facet arthropathy. No canal stenosis or neural foraminal narrowing. C3-4: Anterolisthesis. Small broad-based disc bulge, uncovertebral hypertrophy in severe LEFT, mild RIGHT facet arthropathy. No canal stenosis. Moderate RIGHT and severe suspected LEFT neural foraminal narrowing. C4-5: Small broad-based disc osteophyte and vertebral hypertrophy and severe LEFT and moderate RIGHT facet arthropathy. No canal stenosis. Moderate suspected LEFT neural foraminal narrowing. C5-6: Small broad-based disc bulge, uncovertebral hypertrophy and moderate LEFT, mild RIGHT facet arthropathy. No canal stenosis. Moderate suspected bilateral neural foraminal narrowing. C6-7: Small broad-based disc bulge, uncovertebral hypertrophy and mild facet arthropathy. No canal stenosis. Severe suspected neural narrowing. C7-T1: Anterolisthesis. Annular bulging and severe RIGHT moderate LEFT facet arthropathy. No canal. Mild moderate RIGHT neural foraminal narrowing. MRI THORACIC SPINE FINDINGS-moderately motion degraded examination. ALIGNMENT: Maintenance of the thoracic kyphosis. No malalignment. VERTEBRAE/DISCS: Vertebral bodies are intact. Advanced upper thoracic disc height loss with bridging bone marrow signal compatible with arthrodesis. Multilevel moderate to severe disc height loss with disc desiccation compatible with degenerative discs. T6 acute discogenic endplate changes versus Schmorl's node. No suspicious bone marrow signal. CORD: Limited assessment spinal cord due to motion, no syrinx or definite myelomalacia. PREVERTEBRAL AND PARASPINAL SOFT TISSUES:  Non suspicious. DISC  LEVELS: Small T4-5 central disc protrusion. No canal stenosis. Moderate RIGHT T1-2 neural foraminal narrowing. MRI LUMBAR SPINE FINDINGS-moderately motion degraded axial sequences. SEGMENTATION: For the purposes of this report, the last well-formed intervertebral disc is reported as L5-S1. Transitional anatomy with partially sacralized L5 vertebral body, consistent with prior MRI. ALIGNMENT: Maintained lumbar lordosis. Minimal stable grade 1 L4-5 anterolisthesis and grade 1 L5-S1 anterolisthesis. VERTEBRAE:Vertebral bodies are intact. Similar moderate L2-3 disc height loss, mild at L4-5. Diffuse disc desiccation multilevel mild chronic discogenic endplate changes. L4 inferior endplate acute Schmorl's node. Focal fat versus hemangioma RIGHT L5 pedicle. No suspicious bone marrow signal. CONUS MEDULLARIS AND CAUDA EQUINA: Conus medullaris terminates at L1 and demonstrates normal morphology and signal characteristics. Limited assessment of cauda equina due to motion. PARASPINAL AND OTHER SOFT TISSUES: Non suspicious. Severe paraspinal muscle atrophy and below the level surgical intervention. DISC LEVELS: L1-2: No disc bulge, canal stenosis nor neural foraminal narrowing. Mild facet arthropathy. L2-3: Small broad-based disc bulge. Moderate facet arthropathy and ligamentum flavum redundancy. No canal stenosis. Moderate RIGHT and mild LEFT neural foraminal narrowing. L3-4: Annular bulging. Severe facet arthropathy and ligamentum flavum redundancy. Mild canal stenosis. No neural foraminal narrowing. L4-5: Anterolisthesis. Annular bulging. Moderate to severe facet arthropathy with trace facet effusions. 9 mm RIGHT facet synovial cyst within paraspinal soft tissues. Moderate canal stenosis. Mild bilateral neural foraminal narrowing. L5-S1: Posterior decompression. Anterolisthesis. Severe facet  arthropathy without canal stenosis. Mild-to-moderate bilateral neural foraminal narrowing. IMPRESSION: MRI head: 1. No acute  intracranial process. 2. Stable moderate chronic small vessel ischemic changes. MRI cervical spine: 1. Motion degraded examination. 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 2. Grade 1 C3-4 and C7-T1 anterolisthesis. No acute osseous process. 3. No canal stenosis. Neural foraminal narrowing C3-4 through C7-T1: Likely severe at C3-4 and C6-7. Given degree of motion, CT cervical spine may be of added value. MRI thoracic spine: 1. Motion degraded examination.  No fracture or malalignment. 2. Stable degenerative change of the thoracic spine. No canal. Moderate RIGHT T1-2 neural foraminal narrowing. MRI lumbar spine: 1. Motion degraded examination. 2. Stable grade 1 L4-5 and L5-S1 anterolisthesis. Status post L5 laminectomies. 3. 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 4. Stable neural foraminal narrowing L2-3, L4-5 and L5-S1: Moderate on the RIGHT at L2-3. Electronically Signed   By: Elon Alas M.D.   On: 02/21/2018 23:24   Mr Lumbar Spine Wo Contrast  Result Date: 02/21/2018 CLINICAL DATA:  Nausea, vomiting and weakness. Recent treated urinary tract infection. History of atrial fibrillation, hyperlipidemia, spinal stenosis, spine surgery. EXAM: MRI CERVICAL, THORACIC AND LUMBAR SPINE WITHOUT CONTRAST MRI HEAD WITHOUT CONTRAST TECHNIQUE: Multiplanar and multiecho pulse sequences of the cervical spine, to include the craniocervical junction and cervicothoracic junction, and thoracic and lumbar spine, were obtained without intravenous contrast. Multiplanar, multiecho pulse sequences of the brain and surrounding structures were obtained without intravenous contrast. COMPARISON:  None. MRI head November 21, 2017 and MRI thoracic spine November 12, 2016 and MRI lumbar spine April 21, 2016. FINDINGS: MRI HEAD FINDINGS INTRACRANIAL CONTENTS: No reduced diffusion to suggest acute ischemia or typical infection. No susceptibility artifact to suggest hemorrhage. The ventricles and sulci are normal for  patient's age. Patchy to confluent supratentorial white matter FLAIR T2 hyperintensities. No suspicious parenchymal signal, masses, mass effect. No abnormal extra-axial fluid collections. No extra-axial masses. VASCULAR: Normal major intracranial vascular flow voids present at skull base. SKULL AND UPPER CERVICAL SPINE: No abnormal sellar expansion. No suspicious calvarial bone marrow signal. Craniocervical junction maintained. SINUSES/ORBITS: The mastoid air-cells and included paranasal sinuses are well-aerated.The included ocular globes and orbital contents are non-suspicious. OTHER: None. MRI CERVICAL SPINE FINDINGS-moderately motion degraded sagittal sequences, axial sequences are moderate to severely motion degraded. ALIGNMENT: Straightened cervical lordosis. Grade 1 C3-4 anterolisthesis and C7-T1 anterolisthesis. VERTEBRAE/DISCS: Vertebral bodies are intact. Moderate C3-4 disc height loss with RIGHT T1, bright T2 signal within the disc, no STIR signal abnormality contiguous with LEFT facet, most compatible mineralization. Severe C5-6, C6-7 and moderate C7-T1 disc height loss, and disc desiccation compatible with degenerative discs. Moderate C5-6 and C6-7 subacute discogenic endplate changes. CORD: Linear bright STIR signal at C3-4 not localized on remaining sequences, potentially due to motion. POSTERIOR FOSSA, VERTEBRAL ARTERIES, PARASPINAL TISSUES: No MR findings of ligamentous injury. Vertebral artery flow voids present. Included posterior fossa and paraspinal soft tissues are normal. DISC LEVELS: Limited assessment due to motion. C2-3: Annular bulging, uncovertebral hypertrophy and severe RIGHT, moderate LEFT facet arthropathy. No canal stenosis or neural foraminal narrowing. C3-4: Anterolisthesis. Small broad-based disc bulge, uncovertebral hypertrophy in severe LEFT, mild RIGHT facet arthropathy. No canal stenosis. Moderate RIGHT and severe suspected LEFT neural foraminal narrowing. C4-5: Small  broad-based disc osteophyte and vertebral hypertrophy and severe LEFT and moderate RIGHT facet arthropathy. No canal stenosis. Moderate suspected LEFT neural foraminal narrowing. C5-6: Small broad-based disc bulge, uncovertebral hypertrophy and moderate LEFT, mild RIGHT facet arthropathy. No canal stenosis. Moderate suspected bilateral  neural foraminal narrowing. C6-7: Small broad-based disc bulge, uncovertebral hypertrophy and mild facet arthropathy. No canal stenosis. Severe suspected neural narrowing. C7-T1: Anterolisthesis. Annular bulging and severe RIGHT moderate LEFT facet arthropathy. No canal. Mild moderate RIGHT neural foraminal narrowing. MRI THORACIC SPINE FINDINGS-moderately motion degraded examination. ALIGNMENT: Maintenance of the thoracic kyphosis. No malalignment. VERTEBRAE/DISCS: Vertebral bodies are intact. Advanced upper thoracic disc height loss with bridging bone marrow signal compatible with arthrodesis. Multilevel moderate to severe disc height loss with disc desiccation compatible with degenerative discs. T6 acute discogenic endplate changes versus Schmorl's node. No suspicious bone marrow signal. CORD: Limited assessment spinal cord due to motion, no syrinx or definite myelomalacia. PREVERTEBRAL AND PARASPINAL SOFT TISSUES:  Non suspicious. DISC LEVELS: Small T4-5 central disc protrusion. No canal stenosis. Moderate RIGHT T1-2 neural foraminal narrowing. MRI LUMBAR SPINE FINDINGS-moderately motion degraded axial sequences. SEGMENTATION: For the purposes of this report, the last well-formed intervertebral disc is reported as L5-S1. Transitional anatomy with partially sacralized L5 vertebral body, consistent with prior MRI. ALIGNMENT: Maintained lumbar lordosis. Minimal stable grade 1 L4-5 anterolisthesis and grade 1 L5-S1 anterolisthesis. VERTEBRAE:Vertebral bodies are intact. Similar moderate L2-3 disc height loss, mild at L4-5. Diffuse disc desiccation multilevel mild chronic discogenic  endplate changes. L4 inferior endplate acute Schmorl's node. Focal fat versus hemangioma RIGHT L5 pedicle. No suspicious bone marrow signal. CONUS MEDULLARIS AND CAUDA EQUINA: Conus medullaris terminates at L1 and demonstrates normal morphology and signal characteristics. Limited assessment of cauda equina due to motion. PARASPINAL AND OTHER SOFT TISSUES: Non suspicious. Severe paraspinal muscle atrophy and below the level surgical intervention. DISC LEVELS: L1-2: No disc bulge, canal stenosis nor neural foraminal narrowing. Mild facet arthropathy. L2-3: Small broad-based disc bulge. Moderate facet arthropathy and ligamentum flavum redundancy. No canal stenosis. Moderate RIGHT and mild LEFT neural foraminal narrowing. L3-4: Annular bulging. Severe facet arthropathy and ligamentum flavum redundancy. Mild canal stenosis. No neural foraminal narrowing. L4-5: Anterolisthesis. Annular bulging. Moderate to severe facet arthropathy with trace facet effusions. 9 mm RIGHT facet synovial cyst within paraspinal soft tissues. Moderate canal stenosis. Mild bilateral neural foraminal narrowing. L5-S1: Posterior decompression. Anterolisthesis. Severe facet arthropathy without canal stenosis. Mild-to-moderate bilateral neural foraminal narrowing. IMPRESSION: MRI head: 1. No acute intracranial process. 2. Stable moderate chronic small vessel ischemic changes. MRI cervical spine: 1. Motion degraded examination. 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 2. Grade 1 C3-4 and C7-T1 anterolisthesis. No acute osseous process. 3. No canal stenosis. Neural foraminal narrowing C3-4 through C7-T1: Likely severe at C3-4 and C6-7. Given degree of motion, CT cervical spine may be of added value. MRI thoracic spine: 1. Motion degraded examination.  No fracture or malalignment. 2. Stable degenerative change of the thoracic spine. No canal. Moderate RIGHT T1-2 neural foraminal narrowing. MRI lumbar spine: 1. Motion degraded examination. 2.  Stable grade 1 L4-5 and L5-S1 anterolisthesis. Status post L5 laminectomies. 3. 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 4. Stable neural foraminal narrowing L2-3, L4-5 and L5-S1: Moderate on the RIGHT at L2-3. Electronically Signed   By: Elon Alas M.D.   On: 02/21/2018 23:24   US Abdomen Complete  Result Date: 02/16/2018 CLINICAL DATA:  Nausea. EXAM: ABDOMEN ULTRASOUND COMPLETE COMPARISON:  None. FINDINGS: Gallbladder: No gallstones or wall thickening visualized. No sonographic Murphy sign noted by sonographer. Common bile duct: Diameter: 3 mm which is within normal limits. Liver: No focal lesion identified. Increased echogenicity of hepatic parenchyma is noted. Portal vein is patent on color Doppler imaging with normal direction of blood flow towards  the liver. IVC: No abnormality visualized. Pancreas: Not well visualized due to overlying bowel gas. Spleen: Size and appearance within normal limits. Right Kidney: Length: 11.1 cm. Echogenicity within normal limits. No mass or hydronephrosis visualized. Left Kidney: Length: 12.2 cm. Two simple cysts are noted with the largest measuring 3.2 cm in lower pole. Echogenicity within normal limits. No mass or hydronephrosis visualized. Abdominal aorta: 3.1 cm infrarenal abdominal aortic aneurysm is noted. Other findings: None. IMPRESSION: Increased echogenicity of hepatic parenchyma is noted suggesting fatty infiltration or other diffuse hepatocellular disease. Two simple left renal cysts. 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 Recommend followup by ultrasound in 3 years. This recommendation follows ACR consensus guidelines: White Paper of the ACR Incidental Findings Committee II on Vascular Findings. J Am Coll Radiol 2013; 10:789-794. Electronically Signed   By: Marijo Conception, M.D.   On: 02/16/2018 11:43   Nm Hepato W/eject Fract  Result Date: 02/19/2018 CLINICAL DATA:  Nausea and vomiting 1 month. EXAM: NUCLEAR MEDICINE  HEPATOBILIARY IMAGING WITH GALLBLADDER EF TECHNIQUE: Sequential images of the abdomen were obtained out to 60 minutes following intravenous administration of radiopharmaceutical. After oral ingestion of Ensure, gallbladder ejection fraction was determined. At 60 min, normal ejection fraction is greater than 33%. RADIOPHARMACEUTICALS:  5.9 mCi Tc-6m  Choletec IV COMPARISON:  Abdominal ultrasound 02/16/2018 FINDINGS: Prompt uptake and biliary excretion of activity by the liver is seen. Gallbladder activity is visualized at 20-25 minutes, consistent with patency of cystic duct. Biliary activity passes into small bowel at 20 minutes, consistent with patent common bile duct. Calculated gallbladder ejection fraction is 56%%. (Normal gallbladder ejection fraction with Ensure is greater than 33%.) IMPRESSION: Normal HIDA scan without evidence of cholecystitis. Normal gallbladder ejection fraction. Electronically Signed   By: Marin Olp M.D.   On: 02/19/2018 14:29   Dg Chest Port 1 View  Result Date: 03/03/2018 CLINICAL DATA:  History of ablation, back pain. 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  Chest x-ray 03/01/2018. FINDINGS: Mediastinum and hilar structures are normal. Cardiomegaly. No pulmonary venous congestion. Low lung volumes with bibasilar atelectasis/infiltrates. Small left pleural effusion. No pneumothorax. IMPRESSION: 1.  Low lung volumes with mild bibasilar atelectasis. 2.  Cardiomegaly.  No pulmonary venous congestion Electronically Signed   By: Marcello Moores  Register   On: 03/03/2018 07:33   Dg Chest Portable 1 View  Result Date: 02/21/2018 CLINICAL DATA:  Nausea, vomiting and depression after being treated for urinary tract infection with Cipro. EXAM: PORTABLE CHEST 1 VIEW COMPARISON:  10/23/2017 FINDINGS: The heart size and mediastinal contours are within normal limits. Both lungs are clear. Stable mild elevation or eventration of the right  hemidiaphragm. 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 IMPRESSION: No active disease. Electronically Signed   By: Ashley Royalty M.D.   On: 02/21/2018 18:15    Micro Results    No results found for this or any previous visit (from the past 240 hour(s)).     Today   Subjective:   Mark [name] today has no headache,no chest abdominal pain,no new weakness tingling or numbness, feels much better wants to go home today.   Objective:   Blood pressure 121/72, pulse 83, temperature 98 F (36.7 C), temperature source Oral, resp. rate 18, height 6\' 4"  (1.93 m), weight 99.8 kg, SpO2 91 %.  No intake or output data in the 24 hours ending 03/07/18 2003  Exam Awake Alert, Oriented x 3, No new F.N  deficits, Normal affect [place].AT,PERRAL Supple Neck,No JVD, No cervical lymphadenopathy appriciated.  Symmetrical Chest wall movement, Good air movement bilaterally, CTAB RRR,No Gallops,Rubs or new Murmurs, No Parasternal Heave +ve B.Sounds, Abd Soft, Non tender, No organomegaly appriciated, No rebound -guarding or rigidity. No Cyanosis, Clubbing or edema, No new Rash or bruise  Data Review   CBC w Diff:  Lab Results  Component Value Date   WBC 11.2 (H) 03/02/2018   HGB 15.6 03/02/2018   HGB 15.7 07/04/2013   HCT 47.7 03/02/2018   HCT 46.9 07/04/2013   PLT 237 03/02/2018   PLT 187 07/04/2013   LYMPHOPCT 11 03/01/2018   LYMPHOPCT 29.8 07/04/2013   MONOPCT 9 03/01/2018   MONOPCT 10.9 07/04/2013   EOSPCT 3 03/01/2018   EOSPCT 4.2 07/04/2013   BASOPCT 1 03/01/2018   BASOPCT 1.2 07/04/2013    CMP:  Lab Results  Component Value Date   NA 137 03/02/2018   K 4.0 03/02/2018   CL 107 03/02/2018   CO2 22 03/02/2018   BUN 21 03/02/2018   CREATININE 1.03 03/02/2018   PROT 7.3 03/01/2018   ALBUMIN 4.1 03/01/2018   BILITOT 0.7 03/01/2018   ALKPHOS 62 03/01/2018   AST 31 03/01/2018   ALT 37 03/01/2018  .   Total Time in preparing paper work, data evaluation and  todays exam - 35 minutes  Epifanio Lesches M.D on 03/04/2018 at 8:03 PM    Note: This dictation was prepared with Dragon dictation along with smaller phrase technology. Any transcriptional errors that result from this process are unintentional.

## 2018-04-14 ENCOUNTER — Other Ambulatory Visit: Payer: Self-pay

## 2018-04-14 DIAGNOSIS — J90 Pleural effusion, not elsewhere classified: Secondary | ICD-10-CM

## 2018-04-15 ENCOUNTER — Other Ambulatory Visit: Payer: Self-pay

## 2018-04-15 ENCOUNTER — Ambulatory Visit
Admission: RE | Admit: 2018-04-15 | Discharge: 2018-04-15 | Disposition: A | Discharge status: Home / Self Care | Payer: Medicare Other | Attending: Cardiothoracic Surgery | Admitting: Cardiothoracic Surgery

## 2018-04-15 ENCOUNTER — Ambulatory Visit
Admission: RE | Admit: 2018-04-15 | Discharge: 2018-04-15 | Disposition: A | Discharge status: Home / Self Care | Payer: Medicare Other | Source: Ambulatory Visit | Attending: Cardiothoracic Surgery | Admitting: Cardiothoracic Surgery

## 2018-04-15 ENCOUNTER — Encounter: Payer: Self-pay | Admitting: Cardiothoracic Surgery

## 2018-04-15 ENCOUNTER — Telehealth: Payer: Self-pay

## 2018-04-15 ENCOUNTER — Ambulatory Visit (INDEPENDENT_AMBULATORY_CARE_PROVIDER_SITE_OTHER): Payer: Medicare Other | Admitting: Cardiothoracic Surgery

## 2018-04-15 ENCOUNTER — Other Ambulatory Visit: Payer: Self-pay | Admitting: Cardiothoracic Surgery

## 2018-04-15 DIAGNOSIS — J942 Hemothorax: Secondary | ICD-10-CM | POA: Diagnosis not present

## 2018-04-15 DIAGNOSIS — J90 Pleural effusion, not elsewhere classified: Secondary | ICD-10-CM | POA: Diagnosis present

## 2018-04-15 LAB — POCT I-STAT CREATININE: Creatinine, Ser: 1 mg/dL (ref 0.61–1.24)

## 2018-04-15 MED ORDER — IOPAMIDOL (ISOVUE-300) INJECTION 61%
75.0000 mL | Freq: Once | INTRAVENOUS | Status: AC | PRN
  Administered 2018-04-15: 75 mL via INTRAVENOUS

## 2018-04-15 NOTE — Telephone Encounter (Signed)
Recieved call from [facility] regarding results.  Placed call to Dr.Oaks and read the below to him.  He stated he will call patient this afternoon to discuss.     IMPRESSION: 1. Moderate to large partially loculated right pleural fluid collection is increased in volume when compared with 03/02/2018. 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 If clinically indicated this may be amendable to percutaneous drainage. 2. Right posterior rib fractures appears subacute to chronic with interval healing since 03/02/2018. 3. Aortic Atherosclerosis (ICD10-I70.0). Coronary artery atherosclerotic calcifications noted.

## 2018-04-15 NOTE — Progress Notes (Signed)
Patient ID: Mark [name]., male   DOB: 1940/12/28, 78 y.o.   MRN: [id number]  Chief Complaint  Patient presents with  . Follow-up    Right Hemothorax    Referred By Dr. Roque Lias Reason for Referral right hemothorax  HPI Location, Quality, Duration, Severity, Timing, Context, Modifying Factors, Associated Signs and Symptoms.  [name]. is a 78 y.o. male.  His problems actually began back in November when he was initially treated with ciprofloxacin for urinary tract infection.  He developed significant problems with his balance and was unable to walk a distance without significant risk of fall.  He began using a walker and was then admitted to the hospital with a diagnosis of possible spinal stenosis.  It was ultimately decided that he did not have spinal stenosis but he was discharged home with some physical therapy and home health.  Several days later in early December he fell striking his right side and he was readmitted to the hospital for just 1 evening.  He again was discharged to home and he began improving as far as his balance and his ability to walk.  He now uses a walker but this is improving and is much improved since December.  He had a follow-up appointment with Dr. [name] day and his physical exam was significant for right pleural effusion.  Patient saw me today and had a chest x-ray made prior to the visit.  He does have an enlarging right pleural effusion on the basis of his chest x-ray.  He had a CT scan made back in early December which revealed a small right pleural effusion which was felt to represent blood or other proteinaceous material.  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  He states that overall he is been feeling significantly better relative to his balance.  He does not complain of much shortness of breath except with exertion.   Past Medical History:   Diagnosis Date  . Anxiety   . Atrial fibrillation ([place])   . Back pain, chronic   . Colon polyp   . Cyst of left kidney   . GERD (gastroesophageal reflux disease)   . Heart murmur   . Hyperlipemia   . Mitral valve prolapse   . Neuromuscular disorder ([place])   . Neuropathy   . Osteoarthritis   . Pulmonary emboli ([place])   . Spinal stenosis   . Thyroid nodule   . Vertigo     Past Surgical History:  Procedure Laterality Date  . APPENDECTOMY    . BACK SURGERY    . COLONOSCOPY N/A 08/15/2014   Procedure: COLONOSCOPY;  Surgeon: [name], MD;  Location: [facility] ENDOSCOPY;  Service: Endoscopy;  Laterality: N/A;  . ESOPHAGOGASTRODUODENOSCOPY N/A 08/15/2014   Procedure: ESOPHAGOGASTRODUODENOSCOPY (EGD);  Surgeon: [name], MD;  Location: [facility] ENDOSCOPY;  Service: Endoscopy;  Laterality: N/A;  . TONSILLECTOMY      Family History  Problem Relation Age of Onset  . Stroke Mother   . COPD Father     Social History Social History   Tobacco Use  . Smoking status: Never Smoker  . Smokeless tobacco: Never Used  Substance Use Topics  . Alcohol use: Yes    Alcohol/week: 1.0 standard drinks    Types: 1 Glasses of wine per week  . Drug use: Never    Allergies  Allergen Reactions  .  Ciprofloxacin Other (See Comments)  . Erythromycin Swelling  . Keflex [Cephalexin] Swelling  . Penicillins Swelling    Has patient had a PCN reaction causing immediate rash, facial/tongue/throat swelling, SOB or lightheadedness with hypotension: Yes Has patient had a PCN reaction causing severe rash involving mucus membranes or skin necrosis: No Has patient had a PCN reaction that required hospitalization: Unknown Has patient had a PCN reaction occurring within the last 10 years: No If all of the above answers are "NO", then may proceed with Cephalosporin use.     Current Outpatient Medications  Medication Sig Dispense Refill  . apixaban (ELIQUIS) 2.5 MG TABS tablet Take 2.5 mg by mouth 2  (two) times daily.    . clotrimazole-betamethasone (LOTRISONE) cream Apply 1 application topically 2 (two) times daily.     . Coenzyme Q10 (CO Q 10 PO) Take 1 Dose by mouth daily.     . Cyanocobalamin (B-12) 5000 MCG CAPS Take 5,000 mg by mouth daily. (Patient taking differently: Take 5,000 mcg by mouth daily. ) 30 capsule 0  . esomeprazole (NEXIUM) 40 MG capsule Take 40 mg by mouth daily.     . feeding supplement, ENSURE ENLIVE, (ENSURE ENLIVE) LIQD Take 237 mLs by mouth 2 (two) times daily between meals. 237 mL 12  . finasteride (PROSCAR) 5 MG tablet Take 5 mg by mouth daily.     Marland Kitchen FLUoxetine (PROZAC) 20 MG capsule Take 20 mg by mouth daily.    . Multiple Vitamin (MULTIVITAMIN WITH MINERALS) TABS tablet Take 1 tablet by mouth daily. 30 tablet 0  . rOPINIRole (REQUIP) 0.5 MG tablet Take 1 tablet by mouth at bedtime.    . simvastatin (ZOCOR) 40 MG tablet Take 40 mg by mouth at bedtime.     . tamsulosin (FLOMAX) 0.4 MG CAPS capsule Take 0.4 mg by mouth daily.      Current Facility-Administered Medications  Medication Dose Route Frequency Provider Last Rate Last Dose  . nystatin (MYCOSTATIN/NYSTOP) topical powder   Topical BID Hollice Espy, MD          Review of Systems A complete review of systems was asked and was negative except for the following positive findings none  Blood pressure 133/78, pulse (!) 108, temperature (!) 97 F (36.1 C), temperature source Temporal, resp. rate 18, height 6\' 4"  (1.93 m), weight 230 lb (104.3 kg), SpO2 95 %.  Physical Exam CONSTITUTIONAL:  Pleasant, well-developed, well-nourished, and in no acute distress. EYES: Pupils equal and reactive to light, Sclera non-icteric EARS, NOSE, MOUTH AND THROAT:  The oropharynx was clear.  Dentition is good repair.  Oral mucosa pink and moist. LYMPH NODES:  Lymph nodes in the neck and axillae were normal RESPIRATORY:  Lungs were clear on the left but diminished at the right base.  Normal respiratory effort without  pathologic use of accessory muscles of respiration CARDIOVASCULAR: Heart was regular without murmurs.  There were no carotid bruits. GI: The abdomen was soft, nontender, and nondistended. There were no palpable masses. There was no hepatosplenomegaly. There were normal bowel sounds in all quadrants. GU:  Rectal deferred.   MUSCULOSKELETAL:  Normal muscle strength and tone.  No clubbing or cyanosis.   SKIN:  There were no pathologic skin lesions.  There were no nodules on palpation. NEUROLOGIC:  Sensation is normal.  Cranial nerves are grossly intact. PSYCH:  Oriented to person, place and time.  Mood and affect are normal.  Data Reviewed Chest x-rays and CT scans  I have personally reviewed  the patient's imaging, laboratory findings and medical records.    Assessment    I have independently reviewed his chest x-ray from today.  There is an increasing right-sided pleural effusion.  The lateral views suggest that this might be a loculated pleural effusion.    Plan    I explained to the patient the options for management of his enlarging pleural effusion.  These may involve things from a thoracentesis to percutaneous drainage with intrapleural thrombolytics to thoracoscopy or thoracotomy with decortication.  However I would like to obtain a CT scan of the chest first.  We will do that and I will be back in touch with the patient.  He will call me as soon as he has his scans performed so that I can review those.  Obviously any procedure will have to take into account his anticoagulation status.       Nestor Lewandowsky, MD 04/15/2018, 11:01 AM

## 2018-04-15 NOTE — Patient Instructions (Addendum)
We will order CT chest with contrast today. May need to be ordered STAT.  Dr.Oaks will discuss the next treatment plan with you after the CT scan. They will call Dr Genevive Bi with the results.

## 2018-04-20 ENCOUNTER — Encounter: Payer: Self-pay | Admitting: Psychiatry

## 2018-04-20 ENCOUNTER — Telehealth: Payer: Self-pay | Admitting: *Deleted

## 2018-04-20 ENCOUNTER — Ambulatory Visit (HOSPITAL_BASED_OUTPATIENT_CLINIC_OR_DEPARTMENT_OTHER): Payer: Medicare Other | Admitting: Psychiatry

## 2018-04-20 ENCOUNTER — Other Ambulatory Visit: Payer: Self-pay

## 2018-04-20 ENCOUNTER — Other Ambulatory Visit: Payer: Self-pay | Admitting: *Deleted

## 2018-04-20 VITALS — BP 121/75 | HR 86 | Temp 97.6°F

## 2018-04-20 DIAGNOSIS — F330 Major depressive disorder, recurrent, mild: Secondary | ICD-10-CM

## 2018-04-20 DIAGNOSIS — J942 Hemothorax: Secondary | ICD-10-CM

## 2018-04-20 MED ORDER — BUPROPION HCL 75 MG PO TABS: 75.0000 mg | ORAL_TABLET | Freq: Every morning | ORAL | 1 refills | Status: DC

## 2018-04-20 NOTE — Telephone Encounter (Signed)
Message left on home and cell numbers for patient to call the office.   We received clearance from Dr. Ginette Pitman for procedure-low risk. Dr. Ginette Pitman had instructed patient to hold Eliquis 48 hours prior but per the ultrasound tech at [facility] he does not have to hold this prior.   Patient is scheduled for a thoracentesis at [facility] for tomorrow, 04-21-18 arrive at 2:30 pm. The patient will need to check in at the [facility] registration desk.   The patient also needs an appointment to follow up with Dr. Genevive Bi next week.

## 2018-04-20 NOTE — Telephone Encounter (Signed)
Patient's wife called back and was notified per previous note.   Patient has been scheduled for an appointment with Dr. Genevive Bi for 04-27-18 at 2 pm. Wife aware.

## 2018-04-20 NOTE — Progress Notes (Signed)
Psychiatric Initial Adult Assessment   Patient Identification: Mark [name]. MRN:  [id number] Date of Evaluation:  04/20/2018 Referral Source: Tracie Harrier MD Chief Complaint:  ' I am here to establish care." Chief Complaint    Establish Care; Anxiety; Depression     Visit Diagnosis:    ICD-10-CM   1. MDD (major depressive disorder), recurrent episode, mild (HCC) F33.0     History of Present Illness: Mark [name] is a 78 year old Caucasian male, retired, lives in [place], has a history of depression, recent pneumothorax after a fall, history of syncopal episodes, BPH, history of basal cell carcinoma, hyperlipidemia, vitamin B12 deficiency on replacement, restless leg syndrome, history of pulmonary embolism, presented to clinic today to establish care.  Patient today presented along with his wife who provided collateral information.  Patient reports that his mood symptoms started years ago when he was in college.  He reports while he was in college he had an episode where he fell and passed out.  Patient reports he was diagnosed with seizure disorder and was on Dilantin for years.  Patient reports he never had a passing out episode after that.  Patient reports he went to a psychiatrist at [facility], who ran testing on him and told him that he does not have seizure disorder.  He was taken off of Dilantin and was started on Prozac.  He stayed on Prozac ever since.  He reports most recently he started having several health problems.  He reports he fell few months ago and had hematoma of his side of his chest, which had to be drained.  Patient also reports he started having several falls, instability walking.  He currently uses a wheelchair and has physical therapy.  Patient reports this made him depressed to the point that he began to have lack of motivation, anhedonia on a regular basis.  Patient denies any sleep problems.  Patient reports appetite is fair.  Patient denies suicidality, homicidality or  perceptual disturbances.  Patient denies any anxiety issues.  Patient denies history of trauma.  Patient reports his Prozac was recently increased in December 2019 by Dr. Ginette Pitman from 40 mg to 60 mg.  He reports he has not noticed much difference from the Prozac increase.  Collateral information per wife-wife reports patient has been struggling with depressive symptoms since the past several months more so because of his multiple health problems.  Per wife patient had recurrent UTIs and was started on ciprofloxacin few months ago.  This worsened his gait and made him more unstable.  Wife reports after that patient had a fall which resulted in pneumothorax and he was admitted to the hospital.  He currently continues to need a walker to walk.  He is also getting physical therapy.  Patient is more depressed than anxious.  Wife reports family is very supportive.          Associated Signs/Symptoms: Depression Symptoms:  depressed mood, anhedonia, fatigue, (Hypo) Manic Symptoms:  denies Anxiety Symptoms:  denies Psychotic Symptoms:  denies PTSD Symptoms: Negative  Past Psychiatric History: Patient with history of depression used to be under the care of Dr. Rise Patience.  Denies any inpatient mental health admissions.  Patient denies any suicide attempts.  Patient's antidepressant was recently readjusted by his primary medical doctor Dr.Hande.  Previous Psychotropic Medications: Prozac, wellbutrin ( tried it for 1 week and stopped it )   Substance Abuse History in the last 12 months:  No.  Consequences of Substance Abuse: Negative  Past Medical  History:  Past Medical History:  Diagnosis Date  . Anxiety   . Atrial fibrillation ([place])   . Back pain, chronic   . Colon polyp   . Cyst of left kidney   . GERD (gastroesophageal reflux disease)   . Heart murmur   . Hyperlipemia   . Mitral valve prolapse   . Neuromuscular disorder ([place])   . Neuropathy   . Osteoarthritis   .  Pulmonary emboli ([place])   . Spinal stenosis   . Thyroid nodule   . Vertigo     Past Surgical History:  Procedure Laterality Date  . APPENDECTOMY    . BACK SURGERY    . COLONOSCOPY N/A 08/15/2014   Procedure: COLONOSCOPY;  Surgeon: [name], MD;  Location: [facility] ENDOSCOPY;  Service: Endoscopy;  Laterality: N/A;  . ESOPHAGOGASTRODUODENOSCOPY N/A 08/15/2014   Procedure: ESOPHAGOGASTRODUODENOSCOPY (EGD);  Surgeon: [name], MD;  Location: [facility] ENDOSCOPY;  Service: Endoscopy;  Laterality: N/A;  . TONSILLECTOMY      Family Psychiatric History: Denies family history of mental health problems.  Family History:  Family History  Problem Relation Age of Onset  . Stroke Mother   . COPD Father     Social History:   Social History   Socioeconomic History  . Marital status: Married    Spouse name: gail  . Number of children: 2  . Years of education: Not on file  . Highest education level: Master's degree (e.g., MA, MS, MEng, MEd, MSW, MBA)  Occupational History  . Not on file  Social Needs  . Financial resource strain: Not hard at all  . Food insecurity:    Worry: Never true    Inability: Never true  . Transportation needs:    Medical: No    Non-medical: No  Tobacco Use  . Smoking status: Former Smoker    Last attempt to quit: 04/20/1958    Years since quitting: 60.0  . Smokeless tobacco: Never Used  Substance and Sexual Activity  . Alcohol use: Yes    Alcohol/week: 1.0 standard drinks    Types: 1 Glasses of wine per week  . Drug use: Never  . Sexual activity: Not Currently  Lifestyle  . Physical activity:    Days per week: 2 days    Minutes per session: 60 min  . Stress: Not at all  Relationships  . Social connections:    Talks on phone: Not on file    Gets together: Not on file    Attends religious service: More than 4 times per year    Active member of club or organization: Yes    Attends meetings of clubs or organizations: More than 4 times per year     Relationship status: Married  Other Topics Concern  . Not on file  Social History Narrative  . Not on file    Additional Social History: Patient lives in [place].  Patient is married.  Patient has 2 children who are adults.  Patient has 4 grandchildren.  Patient has a college degree in accounting.  Pt is retired ,used to work as a Engineer, maintenance (IT).  Patient has good relationship with his family  Allergies:   Allergies  Allergen Reactions  . Ciprofloxacin Other (See Comments)  . Erythromycin Swelling  . Keflex [Cephalexin] Swelling  . Penicillins Swelling    Has patient had a PCN reaction causing immediate rash, facial/tongue/throat swelling, SOB or lightheadedness with hypotension: Yes Has patient had a PCN reaction causing severe rash involving mucus membranes  or skin necrosis: No Has patient had a PCN reaction that required hospitalization: Unknown Has patient had a PCN reaction occurring within the last 10 years: No If all of the above answers are "NO", then may proceed with Cephalosporin use.     Metabolic Disorder Labs: No results found for: HGBA1C, MPG No results found for: PROLACTIN No results found for: CHOL, TRIG, HDL, CHOLHDL, VLDL, LDLCALC Lab Results  Component Value Date   TSH 0.866 02/21/2018    Therapeutic Level Labs: No results found for: LITHIUM No results found for: CBMZ No results found for: VALPROATE  Current Medications: Current Outpatient Medications  Medication Sig Dispense Refill  . apixaban (ELIQUIS) 2.5 MG TABS tablet Take 2.5 mg by mouth 2 (two) times daily.    . clotrimazole-betamethasone (LOTRISONE) cream Apply 1 application topically 2 (two) times daily.     . Coenzyme Q10 (CO Q 10 PO) Take 1 Dose by mouth daily.     . Cyanocobalamin (B-12) 5000 MCG CAPS Take 5,000 mg by mouth daily. (Patient taking differently: Take 5,000 mcg by mouth daily. ) 30 capsule 0  . esomeprazole (NEXIUM) 40 MG capsule Take 40 mg by mouth daily.     . feeding supplement,  ENSURE ENLIVE, (ENSURE ENLIVE) LIQD Take 237 mLs by mouth 2 (two) times daily between meals. 237 mL 12  . finasteride (PROSCAR) 5 MG tablet Take 5 mg by mouth daily.     Marland Kitchen FLUoxetine (PROZAC) 20 MG capsule Take 20 mg by mouth daily.    Marland Kitchen rOPINIRole (REQUIP) 0.5 MG tablet Take 1 tablet by mouth at bedtime.    . simvastatin (ZOCOR) 40 MG tablet Take 40 mg by mouth at bedtime.     . tamsulosin (FLOMAX) 0.4 MG CAPS capsule Take 0.4 mg by mouth daily.     Marland Kitchen buPROPion (WELLBUTRIN) 75 MG tablet Take 1 tablet (75 mg total) by mouth every morning. For mood 30 tablet 1   Current Facility-Administered Medications  Medication Dose Route Frequency Provider Last Rate Last Dose  . nystatin (MYCOSTATIN/NYSTOP) topical powder   Topical BID Hollice Espy, MD        Musculoskeletal: Strength & Muscle Tone: within normal limits Gait & Station: wheel chair bound Patient leans: N/A  Psychiatric Specialty Exam: Review of Systems  Psychiatric/Behavioral: Positive for depression.  All other systems reviewed and are negative.   Blood pressure 121/75, pulse 86, temperature 97.6 F (36.4 C), temperature source Oral.There is no height or weight on file to calculate BMI.  General Appearance: Casual  Eye Contact:  Fair  Speech:  Normal Rate  Volume:  Normal  Mood:  Depressed  Affect:  Congruent  Thought Process:  Goal Directed and Descriptions of Associations: Intact  Orientation:  Full (Time, Place, and Person)  Thought Content:  Logical  Suicidal Thoughts:  No  Homicidal Thoughts:  No  Memory:  Immediate;   Fair Recent;   Fair Remote;   Fair  Judgement:  Fair  Insight:  Fair  Psychomotor Activity:  Normal  Concentration:  Concentration: Fair and Attention Span: Fair  Recall:  [employer] of Knowledge:Fair  Language: Fair  Akathisia:  No  Handed:  Right  AIMS (if indicated): Denies tremors, rigidity, stiffness.  Assets:  Communication Skills Desire for Improvement Social Support  ADL's:   Intact  Cognition: WNL  Sleep:  Fair   Screenings:   Assessment and Plan: Khris is a 78 year old Caucasian male who has a history of depression, lives in [place], married,  retired, history of depression, BPH, recent pneumothorax after a fall, syncopal episodes, RLS, basal cell carcinoma, hyperlipidemia, presented to the clinic today to establish care.  Patient is biologically predisposed given his multiple health problems.  Patient also has psychosocial stressors of physical limitations, which is making him more depressed.  Patient will benefit from psychotherapy visits as well as medication management.  Patient does not want his Prozac changed even though he does not think it is helpful.  Patient reports he would rather want to add another medication and does not want to be completely taken off of the Prozac.  Patient denies any substance abuse problems.  Patient denies any suicidality.  Discussed plan as noted below.  Plan MDD PHQ 9 equals 5 Continue Prozac 60 mg p.o. daily Add Wellbutrin 75 mg daily in the morning. We will refer patient for psychotherapy with therapist here in clinic.  I have reviewed medical records in [facility] per Dr. Ginette Pitman - dated 04/14/2018- patient for possible syncopal episode was referred for cardiac cath with cardiologist, advised to continue PT for lower extremity weakness, continued on Proscar for BPH.'   Collateral information was obtained from wife as summarized above.  Will refer patient for psychotherapy sessions with therapist here in clinic.  I have reviewed TSH in [facility] dated 03/11/2018-within normal limits I have reviewed EKG dated 02/21/2018-[id number], pulmonary disorder pattern.  Follow-up in clinic in 2 weeks or sooner if needed.  I have spent atleast 45 minutes  face to face with patient today. More than 50 % of the time was spent for psychoeducation and supportive psychotherapy and care coordination.  This note was generated in part or whole with  voice recognition software. Voice recognition is usually quite accurate but there are transcription errors that can and very often do occur. I apologize for any typographical errors that were not detected and corrected.          Ursula Alert, MD 1/29/20201:55 PM

## 2018-04-20 NOTE — Patient Instructions (Signed)
Bupropion tablets (Depression/Mood Disorders)  What is this medicine?  BUPROPION (byoo PROE pee on) is used to treat depression.  This medicine may be used for other purposes; ask your health care provider or pharmacist if you have questions.  COMMON BRAND NAME(S): Wellbutrin  What should I tell my health care provider before I take this medicine?  They need to know if you have any of these conditions:  -an eating disorder, such as anorexia or bulimia  -bipolar disorder or psychosis  -diabetes or high blood sugar, treated with medication  -glaucoma  -heart disease, previous heart attack, or irregular heart beat  -head injury or brain tumor  -high blood pressure  -kidney or liver disease  -seizures  -suicidal thoughts or a previous suicide attempt  -Tourette's syndrome  -weight loss  -an unusual or allergic reaction to bupropion, other medicines, foods, dyes, or preservatives  -breast-feeding  -pregnant or trying to become pregnant  How should I use this medicine?  Take this medicine by mouth with a glass of water. Follow the directions on the prescription label. You can take it with or without food. If it upsets your stomach, take it with food. Take your medicine at regular intervals. Do not take your medicine more often than directed. Do not stop taking this medicine suddenly except upon the advice of your doctor. Stopping this medicine too quickly may cause serious side effects or your condition may worsen.  A special MedGuide will be given to you by the pharmacist with each prescription and refill. Be sure to read this information carefully each time.  Talk to your pediatrician regarding the use of this medicine in children. Special care may be needed.  Overdosage: If you think you have taken too much of this medicine contact a poison control center or emergency room at once.  NOTE: This medicine is only for you. Do not share this medicine with others.  What if I miss a dose?  If you miss a dose, take it as soon  as you can. If it is less than four hours to your next dose, take only that dose and skip the missed dose. Do not take double or extra doses.  What may interact with this medicine?  Do not take this medicine with any of the following medications:  -linezolid  -MAOIs like Azilect, Carbex, Eldepryl, Marplan, Nardil, and Parnate  -methylene blue (injected into a vein)  -other medicines that contain bupropion like Zyban  This medicine may also interact with the following medications:  -alcohol  -certain medicines for anxiety or sleep  -certain medicines for blood pressure like metoprolol, propranolol  -certain medicines for depression or psychotic disturbances  -certain medicines for HIV or AIDS like efavirenz, lopinavir, nelfinavir, ritonavir  -certain medicines for irregular heart beat like propafenone, flecainide  -certain medicines for Parkinson's disease like amantadine, levodopa  -certain medicines for seizures like carbamazepine, phenytoin, phenobarbital  -cimetidine  -clopidogrel  -cyclophosphamide  -digoxin  -furazolidone  -isoniazid  -nicotine  -orphenadrine  -procarbazine  -steroid medicines like prednisone or cortisone  -stimulant medicines for attention disorders, weight loss, or to stay awake  -tamoxifen  -theophylline  -thiotepa  -ticlopidine  -tramadol  -warfarin  This list may not describe all possible interactions. Give your health care provider a list of all the medicines, herbs, non-prescription drugs, or dietary supplements you use. Also tell them if you smoke, drink alcohol, or use illegal drugs. Some items may interact with your medicine.  What should I watch for   while using this medicine?  Tell your doctor if your symptoms do not get better or if they get worse. Visit your doctor or health care professional for regular checks on your progress. Because it may take several weeks to see the full effects of this medicine, it is important to continue your treatment as prescribed by your  doctor.  Patients and their families should watch out for new or worsening thoughts of suicide or depression. Also watch out for sudden changes in feelings such as feeling anxious, agitated, panicky, irritable, hostile, aggressive, impulsive, severely restless, overly excited and hyperactive, or not being able to sleep. If this happens, especially at the beginning of treatment or after a change in dose, call your health care professional.  Avoid alcoholic drinks while taking this medicine. Drinking excessive alcoholic beverages, using sleeping or anxiety medicines, or quickly stopping the use of these agents while taking this medicine may increase your risk for a seizure.  Do not drive or use heavy machinery until you know how this medicine affects you. This medicine can impair your ability to perform these tasks.  Do not take this medicine close to bedtime. It may prevent you from sleeping.  Your mouth may get dry. Chewing sugarless gum or sucking hard candy, and drinking plenty of water may help. Contact your doctor if the problem does not go away or is severe.  What side effects may I notice from receiving this medicine?  Side effects that you should report to your doctor or health care professional as soon as possible:  -allergic reactions like skin rash, itching or hives, swelling of the face, lips, or tongue  -breathing problems  -changes in vision  -confusion  -elevated mood, decreased need for sleep, racing thoughts, impulsive behavior  -fast or irregular heartbeat  -hallucinations, loss of contact with reality  -increased blood pressure  -redness, blistering, peeling or loosening of the skin, including inside the mouth  -seizures  -suicidal thoughts or other mood changes  -unusually weak or tired  -vomiting  Side effects that usually do not require medical attention (report to your doctor or health care professional if they continue or are bothersome):  -constipation  -headache  -loss of  appetite  -nausea  -tremors  -weight loss  This list may not describe all possible side effects. Call your doctor for medical advice about side effects. You may report side effects to FDA at 1-800-FDA-1088.  Where should I keep my medicine?  Keep out of the reach of children.  Store at room temperature between 20 and 25 degrees C (68 and 77 degrees F), away from direct sunlight and moisture. Keep tightly closed. Throw away any unused medicine after the expiration date.  NOTE: This sheet is a summary. It may not cover all possible information. If you have questions about this medicine, talk to your doctor, pharmacist, or health care provider.  © 2019 [employer]vier/Gold Standard (2015-08-30 13:44:21)

## 2018-04-21 ENCOUNTER — Ambulatory Visit
Admission: RE | Admit: 2018-04-21 | Discharge: 2018-04-21 | Disposition: A | Discharge status: Home / Self Care | Payer: Medicare Other | Source: Ambulatory Visit | Attending: Interventional Radiology | Admitting: Interventional Radiology

## 2018-04-21 ENCOUNTER — Ambulatory Visit
Admission: RE | Admit: 2018-04-21 | Discharge: 2018-04-21 | Disposition: A | Discharge status: Home / Self Care | Payer: Medicare Other | Source: Ambulatory Visit | Attending: Cardiothoracic Surgery | Admitting: Cardiothoracic Surgery

## 2018-04-21 DIAGNOSIS — R918 Other nonspecific abnormal finding of lung field: Secondary | ICD-10-CM | POA: Diagnosis not present

## 2018-04-21 DIAGNOSIS — Z9889 Other specified postprocedural states: Secondary | ICD-10-CM | POA: Diagnosis not present

## 2018-04-21 DIAGNOSIS — J942 Hemothorax: Secondary | ICD-10-CM | POA: Diagnosis not present

## 2018-04-21 LAB — GLUCOSE, PLEURAL OR PERITONEAL FLUID: Glucose, Fluid: 105 mg/dL

## 2018-04-21 LAB — LACTATE DEHYDROGENASE, PLEURAL OR PERITONEAL FLUID: LD, Fluid: 394 U/L — ABNORMAL HIGH (ref 3–23)

## 2018-04-21 LAB — PROTEIN, PLEURAL OR PERITONEAL FLUID: Total protein, fluid: 4.1 g/dL

## 2018-04-21 LAB — AMYLASE, PLEURAL OR PERITONEAL FLUID: Amylase, Fluid: 28 U/L

## 2018-04-21 NOTE — Procedures (Signed)
Pre procedural Dx: Symptomatic Pleural effusion Post procedural Dx: Same  Successful US guided right sided thoracentesis yielding 1.65 L of dark brown pleural fluid.   Samples sent to lab for analysis.  EBL: None  Complications: None immediate.  Ronny Bacon, MD Pager #: [phone]

## 2018-04-22 LAB — BODY FLUID CELL COUNT WITH DIFFERENTIAL
Eos, Fluid: 34 %
Lymphs, Fluid: 61 %
Monocyte-Macrophage-Serous Fluid: 5 %
Neutrophil Count, Fluid: 0 %
Total Nucleated Cell Count, Fluid: 1073 cu mm

## 2018-04-25 LAB — CYTOLOGY - NON PAP

## 2018-04-26 ENCOUNTER — Ambulatory Visit
Admission: RE | Admit: 2018-04-26 | Discharge: 2018-04-26 | Disposition: A | Discharge status: Home / Self Care | Payer: Self-pay | Source: Ambulatory Visit | Attending: Cardiothoracic Surgery | Admitting: Cardiothoracic Surgery

## 2018-04-26 ENCOUNTER — Other Ambulatory Visit: Payer: Self-pay

## 2018-04-26 ENCOUNTER — Other Ambulatory Visit: Payer: Self-pay | Admitting: Cardiothoracic Surgery

## 2018-04-26 DIAGNOSIS — R918 Other nonspecific abnormal finding of lung field: Secondary | ICD-10-CM

## 2018-04-26 DIAGNOSIS — J90 Pleural effusion, not elsewhere classified: Secondary | ICD-10-CM

## 2018-04-27 ENCOUNTER — Ambulatory Visit
Admission: RE | Admit: 2018-04-27 | Discharge: 2018-04-27 | Disposition: A | Discharge status: Home / Self Care | Payer: Medicare Other | Attending: Cardiothoracic Surgery | Admitting: Cardiothoracic Surgery

## 2018-04-27 ENCOUNTER — Ambulatory Visit
Admission: RE | Admit: 2018-04-27 | Discharge: 2018-04-27 | Disposition: A | Discharge status: Home / Self Care | Payer: Medicare Other | Source: Ambulatory Visit | Attending: Cardiothoracic Surgery | Admitting: Cardiothoracic Surgery

## 2018-04-27 ENCOUNTER — Other Ambulatory Visit: Payer: Self-pay | Admitting: *Deleted

## 2018-04-27 ENCOUNTER — Other Ambulatory Visit: Payer: Self-pay

## 2018-04-27 ENCOUNTER — Encounter: Payer: Self-pay | Admitting: Cardiothoracic Surgery

## 2018-04-27 ENCOUNTER — Ambulatory Visit (INDEPENDENT_AMBULATORY_CARE_PROVIDER_SITE_OTHER): Payer: Medicare Other | Admitting: Cardiothoracic Surgery

## 2018-04-27 VITALS — BP 115/66 | HR 106 | Temp 97.3°F | Ht 75.0 in | Wt 206.8 lb

## 2018-04-27 DIAGNOSIS — J942 Hemothorax: Secondary | ICD-10-CM

## 2018-04-27 DIAGNOSIS — J90 Pleural effusion, not elsewhere classified: Secondary | ICD-10-CM

## 2018-04-27 NOTE — Progress Notes (Signed)
  Patient ID: Mark [name]., male   DOB: [birth year], 78 y.o.   MRN: [id number]  HISTORY: He comes in today for the follow-up of his right pleural effusion.  He underwent successful drainage of 1.65 L of dark brown fluid about a week ago.  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  There is no evidence of malignancy or evidence of infection.  He states that he did not feel any significant improvements in his overall symptoms after the thoracentesis.  He does not complain of any shortness of breath now or chest pain.   Vitals:   04/27/18 1344  BP: 115/66  Pulse: (!) 106  Temp: (!) 97.3 F (36.3 C)  SpO2: 91%     EXAM:    Resp: Lungs are clear bilaterally.  No respiratory distress, normal effort. Heart:  Regular without murmurs Abd:  Abdomen is soft, non distended and non tender. No masses are palpable.  There is no rebound and no guarding.  Neurological: Alert and oriented to person, place, and time. Coordination normal.  Skin: Skin is warm and dry. No rash noted. No diaphoretic. No erythema. No pallor.  Psychiatric: Normal mood and affect. Normal behavior. Judgment and thought content normal.    ASSESSMENT: I have independently reviewed his chest x-ray.  There is a small right pleural effusion which is increased slightly from his post thoracentesis film.  I did review with him the results of all the studies.  There is no sign of malignancy.   PLAN:   I will bring the patient back again in 1 week's time with another PA and lateral chest x-ray.  We did discuss the possibility that he may require another thoracentesis.  Obviously we are trying to avoid a thoracoscopy if that can be helped.    Nestor Lewandowsky, MD

## 2018-05-02 ENCOUNTER — Other Ambulatory Visit: Payer: Self-pay

## 2018-05-02 DIAGNOSIS — Z9889 Other specified postprocedural states: Secondary | ICD-10-CM

## 2018-05-04 ENCOUNTER — Ambulatory Visit: Payer: Medicare Other | Admitting: Psychiatry

## 2018-05-05 ENCOUNTER — Ambulatory Visit (INDEPENDENT_AMBULATORY_CARE_PROVIDER_SITE_OTHER): Payer: Medicare Other | Admitting: Urology

## 2018-05-05 ENCOUNTER — Encounter: Payer: Self-pay | Admitting: Urology

## 2018-05-05 VITALS — BP 91/57 | HR 101 | Ht 76.0 in | Wt 208.0 lb

## 2018-05-05 DIAGNOSIS — N139 Obstructive and reflux uropathy, unspecified: Secondary | ICD-10-CM

## 2018-05-05 DIAGNOSIS — N471 Phimosis: Secondary | ICD-10-CM | POA: Diagnosis not present

## 2018-05-05 DIAGNOSIS — N481 Balanitis: Secondary | ICD-10-CM | POA: Diagnosis not present

## 2018-05-05 DIAGNOSIS — N401 Enlarged prostate with lower urinary tract symptoms: Secondary | ICD-10-CM | POA: Diagnosis not present

## 2018-05-05 DIAGNOSIS — R350 Frequency of micturition: Secondary | ICD-10-CM

## 2018-05-05 LAB — MICROSCOPIC EXAMINATION: RBC, UA: NONE SEEN /hpf (ref 0–2)

## 2018-05-05 LAB — URINALYSIS, COMPLETE
Bilirubin, UA: NEGATIVE
Glucose, UA: NEGATIVE
Ketones, UA: NEGATIVE
NITRITE UA: NEGATIVE
Protein, UA: NEGATIVE
Specific Gravity, UA: 1.02 (ref 1.005–1.030)
Urobilinogen, Ur: 0.2 mg/dL (ref 0.2–1.0)
pH, UA: 5 (ref 5.0–7.5)

## 2018-05-05 LAB — BLADDER SCAN AMB NON-IMAGING

## 2018-05-05 MED ORDER — NYSTATIN-TRIAMCINOLONE 100000-0.1 UNIT/GM-% EX OINT: 1.0000 "application " | TOPICAL_OINTMENT | Freq: Two times a day (BID) | CUTANEOUS | 0 refills | Status: DC

## 2018-05-05 NOTE — Progress Notes (Signed)
05/05/2018  11:16 AM   Mark [name]. 1941-03-14 [id number]  Referring provider: Tracie Harrier, MD [address] [facility] [place], [place] [address]  Chief Complaint  Patient presents with  . Benign Prostatic Hypertrophy    follow up    HPI: Mark [name]. is a 78 yo M who returns today for the evaluation and management of BPH.  He in fact was accompanying his wife today to our clinic as she is establishing care as a new patient.  He mentioned that he has been having increasing difficulty emptying his bladder concerning for possible retention and requested a same-day appointment.  Notably since last visit, he was hospitalized with a fall and developed a pleural effusion which is been drained.  He is following up with Dr. Genevive Bi again and will likely need a repeat drainage per his report.  Around this time.,  He is felt like he has had been having more trouble emptying his bladder, primarily in the morning time.  He also feels like his prostate "swelling". He is not retaining urine. He feels that he emptied today but during the night he makes frequent trips to the bathroom in a short time period.  No dysuria or gross hematuria.  No concern for UTI per his report.  He is currently on Flomax and finasteride. He is on maximal medical therapy.   He also was unable to retract the foreskin of his penis for the last 2-3 months.  He reports that he was never circumcised.  He has been using antifungal/steroid cream intermittently over the past several years.  His PVR today is 60 mL.   PSA Trend: 01/01/2017 PSA was 0.42 01/01/2016 PSA was 0.64 12/26/2014 PSA was 0.74 12/22/2013 PSA was 0.52   PMH: Past Medical History:  Diagnosis Date  . Anxiety   . Atrial fibrillation ([place])   . Back pain, chronic   . Colon polyp   . Cyst of left kidney   . GERD (gastroesophageal reflux disease)   . Heart murmur   . Hyperlipemia   . Mitral valve prolapse   . Neuromuscular  disorder ([place])   . Neuropathy   . Osteoarthritis   . Pulmonary emboli ([place])   . Spinal stenosis   . Thyroid nodule   . Vertigo     Surgical History: Past Surgical History:  Procedure Laterality Date  . APPENDECTOMY    . BACK SURGERY    . COLONOSCOPY N/A 08/15/2014   Procedure: COLONOSCOPY;  Surgeon: [name], MD;  Location[facility] ENDOSCOPY;  Service: Endoscopy;  Laterality: N/A;  . ESOPHAGOGASTRODUODENOSCOPY N/A 08/15/2014   Procedure: ESOPHAGOGASTRODUODENOSCOPY (EGD);  Surgeon: [name], MD;  Location: [facility] ENDOSCOPY;  Service: Endoscopy;  Laterality: N/A;  . TONSILLECTOMY      Home Medications:  Allergies as of 05/05/2018      Reactions   Ciprofloxacin Other (See Comments)   Erythromycin Swelling   Keflex [cephalexin] Swelling   Penicillins Swelling   Has patient had a PCN reaction causing immediate rash, facial/tongue/throat swelling, SOB or lightheadedness with hypotension: Yes Has patient had a PCN reaction causing severe rash involving mucus membranes or skin necrosis: No Has patient had a PCN reaction that required hospitalization: Unknown Has patient had a PCN reaction occurring within the last 10 years: No If all of the above answers are "NO", then may proceed with Cephalosporin use.      Medication List       Accurate as of  May 05, 2018 11:16 AM. Always use your most recent med list.        B-12 5000 MCG Caps Take 5,000 mg by mouth daily.   buPROPion 75 MG tablet Commonly known as:  WELLBUTRIN Take 1 tablet (75 mg total) by mouth every morning. For mood   clotrimazole-betamethasone cream Commonly known as:  LOTRISONE Apply 1 application topically 2 (two) times daily.   CO Q 10 PO Take 1 Dose by mouth daily.   ELIQUIS 2.5 MG Tabs tablet Generic drug:  apixaban Take 2.5 mg by mouth 2 (two) times daily.   esomeprazole 40 MG capsule Commonly known as:  NEXIUM Take 40 mg by mouth daily.   feeding supplement (ENSURE ENLIVE)  Liqd Take 237 mLs by mouth 2 (two) times daily between meals.   finasteride 5 MG tablet Commonly known as:  PROSCAR Take 5 mg by mouth daily.   nystatin-triamcinolone ointment Commonly known as:  MYCOLOG Apply 1 application topically 2 (two) times daily.   PROZAC PO Take 60 mg by mouth daily.   rOPINIRole 0.5 MG tablet Commonly known as:  REQUIP Take 1 tablet by mouth at bedtime.   simvastatin 40 MG tablet Commonly known as:  ZOCOR Take 40 mg by mouth at bedtime.   tamsulosin 0.4 MG Caps capsule Commonly known as:  FLOMAX Take 0.4 mg by mouth daily.       Allergies:  Allergies  Allergen Reactions  . Ciprofloxacin Other (See Comments)  . Erythromycin Swelling  . Keflex [Cephalexin] Swelling  . Penicillins Swelling    Has patient had a PCN reaction causing immediate rash, facial/tongue/throat swelling, SOB or lightheadedness with hypotension: Yes Has patient had a PCN reaction causing severe rash involving mucus membranes or skin necrosis: No Has patient had a PCN reaction that required hospitalization: Unknown Has patient had a PCN reaction occurring within the last 10 years: No If all of the above answers are "NO", then may proceed with Cephalosporin use.     Family History: Family History  Problem Relation Age of Onset  . Stroke Mother   . COPD Father     Social History:  reports that he quit smoking about 60 years ago. He has never used smokeless tobacco. He reports current alcohol use of about 1.0 standard drinks of alcohol per week. He reports that he does not use drugs.  ROS: UROLOGY Frequent Urination?: Yes Hard to postpone urination?: No Burning/pain with urination?: No Get up at night to urinate?: Yes Leakage of urine?: No Urine stream starts and stops?: Yes Trouble starting stream?: Yes Do you have to strain to urinate?: No Blood in urine?: No Urinary tract infection?: No Sexually transmitted disease?: No Injury to kidneys or bladder?:  No Painful intercourse?: No Weak stream?: No Erection problems?: No Penile pain?: No  Gastrointestinal Nausea?: No Vomiting?: No Indigestion/heartburn?: No Diarrhea?: No Constipation?: No  Constitutional Fever: No Night sweats?: No Weight loss?: No Fatigue?: No  Skin Skin rash/lesions?: No Itching?: No  Eyes Blurred vision?: No Double vision?: No  Ears/Nose/Throat Sore throat?: No Sinus problems?: No  Hematologic/Lymphatic Swollen glands?: No Easy bruising?: No  Cardiovascular Leg swelling?: No Chest pain?: No  Respiratory Cough?: No Shortness of breath?: No  Endocrine Excessive thirst?: No  Musculoskeletal Back pain?: Yes Joint pain?: No  Neurological Headaches?: No Dizziness?: No  Psychologic Depression?: Yes Anxiety?: No  Physical Exam: BP (!) 91/57   Pulse (!) 101   Ht 6\' 4"  (1.93 m)   Wt 208 lb (  94.3 kg)   BMI 25.32 kg/m   Constitutional:  Alert and oriented, No acute distress.  In wheelchair, accompanied by wife today. HEENT: [place] AT, moist mucus membranes.  Trachea midline, no masses. Cardiovascular: No clubbing, cyanosis, or edema. Respiratory: Normal respiratory effort, no increased work of breathing. GU: No CVA tenderness, significant phimosis, foreskin unable to retract and visualize glans  Skin: No rashes, bruises or suspicious lesions. Neurologic: Grossly intact, no focal deficits, moving all 4 extremities. Psychiatric: Normal mood and affect.  Laboratory Data:   Urinalysis Results for orders placed or performed in visit on 05/05/18  Microscopic Examination  Result Value Ref Range   WBC, UA 0-5 0 - 5 /hpf   RBC, UA None seen 0 - 2 /hpf   Epithelial Cells (non renal) 0-10 0 - 10 /hpf   Casts Present (A) None seen /lpf   Cast Type Hyaline casts N/A   Mucus, UA Present (A) Not Estab.   Bacteria, UA Moderate (A) None seen/Few  Urinalysis, Complete  Result Value Ref Range   Specific Gravity, UA 1.020 1.005 - 1.030   pH,  UA 5.0 5.0 - 7.5   Color, UA Yellow Yellow   Appearance Ur Clear Clear   Leukocytes, UA 1+ (A) Negative   Protein, UA Negative Negative/Trace   Glucose, UA Negative Negative   Ketones, UA Negative Negative   RBC, UA Trace (A) Negative   Bilirubin, UA Negative Negative   Urobilinogen, Ur 0.2 0.2 - 1.0 mg/dL   Nitrite, UA Negative Negative   Microscopic Examination See below:   BLADDER SCAN AMB NON-IMAGING  Result Value Ref Range   Scan Result 39ml     Pertinent Imaging: Results for orders placed or performed in visit on 05/05/18  BLADDER SCAN AMB NON-IMAGING  Result Value Ref Range   Scan Result 1ml     Assessment & Plan:    1. BPH with urinary frequency / obstruction PVR 60 mL, he is emptying which is reassuring  UA negative for infection Continue Flomax and finasteride  Given pt is on maximum medical therapy, alternatives include surgical intervention which the patient is not interested in pursuing SPECT exacerbation of symptoms related to recent medical illness, hospitalization and fluid shifts  2. Phimosis / Balanitis -Uncircumcised and is unable to retract foreskin  -Discussed the surgical procedure of circumcision along with the pros and cons -Rx of Mycolog given to be applied two times daily topically -Keep f/u appt on 02/02/2019 for IPSS / PVR/ DRE / PSA   Return for f/u as scheduled.  [facility] [address] [place], [place] [address] [phone]  I, Lucas Mallow, am acting as a scribe for Dr. Hollice Espy,  I have reviewed the above documentation for accuracy and completeness, and I agree with the above.   Hollice Espy, MD

## 2018-05-06 ENCOUNTER — Ambulatory Visit (INDEPENDENT_AMBULATORY_CARE_PROVIDER_SITE_OTHER): Payer: Medicare Other | Admitting: Cardiothoracic Surgery

## 2018-05-06 ENCOUNTER — Encounter: Payer: Self-pay | Admitting: Cardiothoracic Surgery

## 2018-05-06 ENCOUNTER — Ambulatory Visit
Admission: RE | Admit: 2018-05-06 | Discharge: 2018-05-06 | Disposition: A | Discharge status: Home / Self Care | Payer: Medicare Other | Source: Ambulatory Visit | Attending: Cardiothoracic Surgery | Admitting: Cardiothoracic Surgery

## 2018-05-06 ENCOUNTER — Ambulatory Visit
Admission: RE | Admit: 2018-05-06 | Discharge: 2018-05-06 | Disposition: A | Discharge status: Home / Self Care | Payer: Medicare Other | Attending: Cardiothoracic Surgery | Admitting: Cardiothoracic Surgery

## 2018-05-06 ENCOUNTER — Other Ambulatory Visit: Payer: Self-pay

## 2018-05-06 VITALS — BP 133/78 | HR 96 | Temp 97.1°F | Ht 76.0 in | Wt 206.0 lb

## 2018-05-06 DIAGNOSIS — J942 Hemothorax: Secondary | ICD-10-CM | POA: Diagnosis not present

## 2018-05-06 DIAGNOSIS — Z9889 Other specified postprocedural states: Secondary | ICD-10-CM | POA: Insufficient documentation

## 2018-05-06 NOTE — Patient Instructions (Signed)
Return as needed.The patient is aware to call back for any questions or concerns.  

## 2018-05-06 NOTE — Progress Notes (Signed)
  Patient ID: Mark Kovar., male   DOB: 09/01/1940, 78 y.o.   MRN: [id number]  HISTORY: He returns today in follow-up.  He has no pain associated with his right flank.  He is not short of breath.  He has no cough or fevers.   Vitals:   05/06/18 1037  BP: 133/78  Pulse: 96  Temp: (!) 97.1 F (36.2 C)  SpO2: 100%     EXAM:    Resp: Lungs are clear bilaterally.  No respiratory distress, normal effort. Heart:  Regular without murmurs Abd:  Abdomen is soft, non distended and non tender. No masses are palpable.  There is no rebound and no guarding.  Neurological: Alert and oriented to person, place, and time. Coordination normal.  Skin: Skin is warm and dry. No rash noted. No diaphoretic. No erythema. No pallor.  Psychiatric: Normal mood and affect. Normal behavior. Judgment and thought content normal.    ASSESSMENT: He did have a chest x-ray made today.  This shows only a trace right-sided pleural effusion.  I do not appreciate any obvious rib fractures.   PLAN:   Since he is essentially asymptomatic at this point I did not recommend any further follow-up with me.  He will come back to see me if he has any problems with his recurrent pleural effusion.    Nestor Lewandowsky, MD

## 2018-05-09 ENCOUNTER — Other Ambulatory Visit: Payer: Self-pay

## 2018-05-09 ENCOUNTER — Encounter: Payer: Self-pay | Admitting: Psychiatry

## 2018-05-09 ENCOUNTER — Ambulatory Visit (INDEPENDENT_AMBULATORY_CARE_PROVIDER_SITE_OTHER): Payer: Medicare Other | Admitting: Psychiatry

## 2018-05-09 ENCOUNTER — Ambulatory Visit: Payer: Medicare Other | Admitting: Licensed Clinical Social Worker

## 2018-05-09 VITALS — BP 116/71 | HR 88 | Temp 99.0°F

## 2018-05-09 DIAGNOSIS — G4701 Insomnia due to medical condition: Secondary | ICD-10-CM

## 2018-05-09 DIAGNOSIS — F330 Major depressive disorder, recurrent, mild: Secondary | ICD-10-CM

## 2018-05-09 MED ORDER — BUPROPION HCL ER (XL) 150 MG PO TB24: 150.0000 mg | ORAL_TABLET | Freq: Every day | ORAL | 1 refills | Status: DC

## 2018-05-09 NOTE — Progress Notes (Signed)
[place] MD OP Progress Note  05/09/2018 2:37 PM Mark [name].  MRN:  [id number]  Chief Complaint: ' I am here for follow up.' Chief Complaint    Follow-up     HPI: Mark [name] is a 78 yr old Caucasian male, retired, lives in [place], has a history of depression, recent pneumothorax after a fall, history of syncopal episode, BPH, history of basal cell carcinoma, hyperlipidemia, vitamin B12 deficiency on replacement, restless leg syndrome, history of pulmonary embolism, presented to clinic today for a follow-up visit.  Patient's wife presented along with him and provided collateral information.  Patient today reports he continues to struggle with sadness, being withdrawn, sleep problems.  He reports he is tolerating the Wellbutrin okay.  He is interested in a dosage increase today.  He reports it is difficult for him to get used to his limited mobility and being a burden on others.  [place] and he reports there is a lot of things that needs to be taken care of and that also makes him anxious.  He does have children however they are busy and he hence does not want to burden them too much.  Patient reports he has to wake up every 1 hour to urinate due to his prostate problems.  He had appointment with urology and they cannot do anything more for him.  He hence continues to have sleep problems.  He however reports he does not want a sleep aid.  Discussed sleep hygiene techniques.  Patient denies any suicidality, reports she will not do anything like that because of his wife.  Patient denies any other concerns today. Visit Diagnosis:    ICD-10-CM   1. MDD (major depressive disorder), recurrent episode, mild (HCC) F33.0 buPROPion (WELLBUTRIN XL) 150 MG 24 hr tablet  2. Insomnia due to medical condition G47.01     Past Psychiatric History: I have reviewed past psychiatric history from my progress note on 04/20/2018.  Past trials of Prozac, Wellbutrin  Past  Medical History:  Past Medical History:  Diagnosis Date  . Anxiety   . Atrial fibrillation ([place])   . Back pain, chronic   . Colon polyp   . Cyst of left kidney   . GERD (gastroesophageal reflux disease)   . Heart murmur   . Hyperlipemia   . Mitral valve prolapse   . Neuromuscular disorder ([place])   . Neuropathy   . Osteoarthritis   . Pulmonary emboli ([employer])   . Spinal stenosis   . Thyroid nodule   . Vertigo     Past Surgical History:  Procedure Laterality Date  . APPENDECTOMY    . BACK SURGERY    . COLONOSCOPY N/A 08/15/2014   Procedure: COLONOSCOPY;  Surgeon: [name], MD;  Location: [facility] ENDOSCOPY;  Service: Endoscopy;  Laterality: N/A;  . ESOPHAGOGASTRODUODENOSCOPY N/A 08/15/2014   Procedure: ESOPHAGOGASTRODUODENOSCOPY (EGD);  Surgeon: [name], MD;  Location: [facility] ENDOSCOPY;  Service: Endoscopy;  Laterality: N/A;  . TONSILLECTOMY      Family Psychiatric History: I have reviewed family psychiatric history from my progress note on 04/20/2018  Family History:  Family History  Problem Relation Age of Onset  . Stroke Mother   . COPD Father     Social History: Reviewed social history from my progress note on 04/20/2018. Social History   Socioeconomic History  . Marital status: Married    Spouse name: gail  . Number of children: 2  . Years of  education: Not on file  . Highest education level: Master's degree (e.g., MA, MS, MEng, MEd, MSW, MBA)  Occupational History  . Not on file  Social Needs  . Financial resource strain: Not hard at all  . Food insecurity:    Worry: Never true    Inability: Never true  . Transportation needs:    Medical: No    Non-medical: No  Tobacco Use  . Smoking status: Former Smoker    Last attempt to quit: 04/20/1958    Years since quitting: 60.0  . Smokeless tobacco: Never Used  Substance and Sexual Activity  . Alcohol use: Yes    Alcohol/week: 1.0 standard drinks    Types: 1 Glasses of wine per week  . Drug use: Never   . Sexual activity: Not Currently  Lifestyle  . Physical activity:    Days per week: 2 days    Minutes per session: 60 min  . Stress: Not at all  Relationships  . Social connections:    Talks on phone: Not on file    Gets together: Not on file    Attends religious service: More than 4 times per year    Active member of club or organization: Yes    Attends meetings of clubs or organizations: More than 4 times per year    Relationship status: Married  Other Topics Concern  . Not on file  Social History Narrative  . Not on file    Allergies:  Allergies  Allergen Reactions  . Ciprofloxacin Other (See Comments)  . Erythromycin Swelling  . Keflex [Cephalexin] Swelling  . Penicillins Swelling    Has patient had a PCN reaction causing immediate rash, facial/tongue/throat swelling, SOB or lightheadedness with hypotension: Yes Has patient had a PCN reaction causing severe rash involving mucus membranes or skin necrosis: No Has patient had a PCN reaction that required hospitalization: Unknown Has patient had a PCN reaction occurring within the last 10 years: No If all of the above answers are "NO", then may proceed with Cephalosporin use.     Metabolic Disorder Labs: No results found for: HGBA1C, MPG No results found for: PROLACTIN No results found for: CHOL, TRIG, HDL, CHOLHDL, VLDL, LDLCALC Lab Results  Component Value Date   TSH 0.866 02/21/2018    Therapeutic Level Labs: No results found for: LITHIUM No results found for: VALPROATE No components found for:  CBMZ  Current Medications: Current Outpatient Medications  Medication Sig Dispense Refill  . apixaban (ELIQUIS) 2.5 MG TABS tablet Take 2.5 mg by mouth 2 (two) times daily.    . clotrimazole-betamethasone (LOTRISONE) cream Apply 1 application topically 2 (two) times daily.     . Coenzyme Q10 (CO Q 10 PO) Take 1 Dose by mouth daily.     . Cyanocobalamin (B-12) 5000 MCG CAPS Take 5,000 mg by mouth daily. (Patient  taking differently: Take 5,000 mcg by mouth daily. ) 30 capsule 0  . esomeprazole (NEXIUM) 40 MG capsule Take 40 mg by mouth daily.     . feeding supplement, ENSURE ENLIVE, (ENSURE ENLIVE) LIQD Take 237 mLs by mouth 2 (two) times daily between meals. 237 mL 12  . finasteride (PROSCAR) 5 MG tablet Take 5 mg by mouth daily.     Marland Kitchen FLUoxetine HCl (PROZAC PO) Take 60 mg by mouth daily.     Marland Kitchen nystatin-triamcinolone ointment (MYCOLOG) Apply 1 application topically 2 (two) times daily. 30 g 0  . rOPINIRole (REQUIP) 0.5 MG tablet Take 1 tablet by mouth at  bedtime.    . simvastatin (ZOCOR) 40 MG tablet Take 40 mg by mouth at bedtime.     . tamsulosin (FLOMAX) 0.4 MG CAPS capsule Take 0.4 mg by mouth daily.     Marland Kitchen buPROPion (WELLBUTRIN XL) 150 MG 24 hr tablet Take 1 tablet (150 mg total) by mouth daily. 30 tablet 1   Current Facility-Administered Medications  Medication Dose Route Frequency Provider Last Rate Last Dose  . nystatin (MYCOSTATIN/NYSTOP) topical powder   Topical BID Hollice Espy, MD         Musculoskeletal: Strength & Muscle Tone: within normal limits Gait & Station: in a wheelchair Patient leans: N/A  Psychiatric Specialty Exam: Review of Systems  Genitourinary: Positive for frequency.  Psychiatric/Behavioral: Positive for depression. The patient has insomnia.   All other systems reviewed and are negative.   Blood pressure 116/71, pulse 88, temperature 99 F (37.2 C), temperature source Oral.There is no height or weight on file to calculate BMI.  General Appearance: Casual  Eye Contact:  Fair  Speech:  Normal Rate  Volume:  Normal  Mood:  Depressed  Affect:  Appropriate  Thought Process:  Goal Directed and Descriptions of Associations: Intact  Orientation:  Full (Time, Place, and Person)  Thought Content: Logical   Suicidal Thoughts:  No  Homicidal Thoughts:  No  Memory:  Immediate;   Fair Recent;   Fair Remote;   Fair  Judgement:  Fair  Insight:  Fair  Psychomotor  Activity:  Normal  Concentration:  Concentration: Fair and Attention Span: Fair  Recall:  [employer] of Knowledge: Fair  Language: Fair  Akathisia:  No  Handed:  Right  AIMS (if indicated): denies tremors, rigidity,stiffness  Assets:  Communication Skills Desire for Improvement Social Support  ADL's:  Intact  Cognition: WNL  Sleep:  Poor   Screenings:   Assessment and Plan: Mark [name] is a 78 year old Caucasian male who lives in [place], married, retired, has a history of depression ,history of BPH, recent pneumothorax after a fall, syncopal episode, RLS, basal cell carcinoma, hyperlipidemia, presented to clinic today for a follow-up visit.  Patient is biologically predisposed given his multiple health problems.  Patient has psychosocial stressors with physical limitations which is making him even more depressed.  Patient does have good social support system.  Patient however will benefit from medication changes as well as psychotherapy sessions.  Plan as noted below.  Plan MDD- unstable Increase Wellbutrin to extended release 150 mg p.o. daily in the morning Prozac 60 mg p.o. daily Will refer patient to Ms. Merleen Nicely Craig-therapist here in clinic.  Patient requested a sooner appointment for therapy and hence scheduled him for this Thursday.  Insomnia-unstable Patient declines medications. Discussed sleep hygiene techniques. Patient with BPH, frequent urination which is affecting his sleep.  Collateral information was obtained from wife-who reports patient continues to struggle with depressive symptoms and is seen as being withdrawn.  Follow up in clinic in 4 weeks or sooner if needed.  I have spent atleast 15 minutes face to face with patient today. More than 50 % of the time was spent for psychoeducation and supportive psychotherapy and care coordination.  This note was generated in part or whole with voice recognition software. Voice recognition is usually quite accurate but there  are transcription errors that can and very often do occur. I apologize for any typographical errors that were not detected and corrected.      Ursula Alert, MD 05/09/2018, 2:37 PM

## 2018-05-12 ENCOUNTER — Encounter: Payer: Self-pay | Admitting: Licensed Clinical Social Worker

## 2018-05-12 ENCOUNTER — Ambulatory Visit (INDEPENDENT_AMBULATORY_CARE_PROVIDER_SITE_OTHER): Payer: Medicare Other | Admitting: Licensed Clinical Social Worker

## 2018-05-12 DIAGNOSIS — F330 Major depressive disorder, recurrent, mild: Secondary | ICD-10-CM

## 2018-05-12 NOTE — Progress Notes (Signed)
Comprehensive Clinical Assessment (CCA) Note  05/12/2018 Mark [name]. [id number]  Visit Diagnosis:      ICD-10-CM   1. MDD (major depressive disorder), recurrent episode, mild (HCC) F33.0       CCA Part One  Part One has been completed on paper by the patient.  (See scanned document in Chart Review)  CCA Part Two A  Intake/Chief Complaint:  CCA Intake With Chief Complaint CCA Part Two Date: 05/12/18 CCA Part Two Time: 45 Chief Complaint/Presenting Problem: "I'm depressed. I fell a couple of times about six months ago. I've been taken out of doing a lot of stuff, and I'm not able to a lot of stuff."  Patients Currently Reported Symptoms/Problems: Depression, "I really don't care to leave home. A lost of interest in things that I like. 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   Mental Health Symptoms Depression:  Depression: Change in energy/activity, Difficulty Concentrating, Fatigue, Hopelessness, Increase/decrease in appetite, Weight gain/loss, Sleep (too much or little), Worthlessness  Mania:  Mania: N/A  Anxiety:   Anxiety: Difficulty concentrating, Fatigue, Sleep, Tension, Worrying  Psychosis:  Psychosis: N/A  Trauma:  Trauma: N/A  Obsessions:  Obsessions: N/A  Compulsions:  Compulsions: N/A  Inattention:  Inattention: N/A  Hyperactivity/Impulsivity:  Hyperactivity/Impulsivity: N/A  Oppositional/Defiant Behaviors:  Oppositional/Defiant Behaviors: N/A  Borderline Personality:  Emotional Irregularity: N/A  Other Mood/Personality Symptoms:  Other Mood/Personality Symtpoms: N/A   Mental Status Exam Appearance and self-care   Stature:  Stature: Average  Weight:  Weight: Average weight  Clothing:  Clothing: Neat/clean  Grooming:  Grooming: Normal  Cosmetic use:  Cosmetic Use: None  Posture/gait:  Posture/Gait: Normal  Motor activity:  Motor Activity: Not Remarkable  Sensorium  Attention:  Attention: Normal  Concentration:  Concentration: Normal  Orientation:  Orientation: X5  Recall/memory:  Recall/Memory: Normal  Affect and Mood  Affect:  Affect: Anxious  Mood:  Mood: Anxious  Relating  Eye contact:  Eye Contact: Normal  Facial expression:  Facial Expression: Anxious  Attitude toward examiner:  Attitude Toward Examiner: Cooperative  Thought and Language  Speech flow: Speech Flow: Normal  Thought content:  Thought Content: Appropriate to mood and circumstances  Preoccupation:  Preoccupations: (N/A)  Hallucinations:  Hallucinations: (N/A)  Organization:     Transport planner of Knowledge:  Fund of Knowledge: Average  Intelligence:  Intelligence: Average  Abstraction:  Abstraction: Normal  Judgement:  Judgement: Normal  Reality Testing:  Reality Testing: Realistic  Insight:  Insight: Good  Decision Making:  Decision Making: Normal  Social Functioning  Social Maturity:  Social Maturity: Isolates  Social Judgement:  Social Judgement: Normal  Stress  Stressors:  Stressors: Transitions  Coping Ability:  Coping Ability: Normal  Skill Deficits:     Supports:      Family and Psychosocial History: Family history Marital status: Married Number of Years Married: 38 What types of issues is patient dealing with in the relationship?: None reported  Additional relationship information: N/A Are you sexually active?: No What is your sexual orientation?: Heterosexual  Has your sexual activity been affected by drugs, alcohol, medication, or emotional stress?: N/A Does patient have children?: Yes How many children?: 2 How is patient's  relationship with their children?: "It's good."   Childhood  History:  Childhood History By whom was/is the patient raised?: Both parents Additional childhood history information: N/A Description of patient's relationship with caregiver when they were a child: Mom: "fine." Dad: "Same thing."  Patient's description of current relationship with people who raised him/her: Deceased  How were you disciplined when you got in trouble as a child/adolescent?: "Switches."  Does patient have siblings?: Yes Number of Siblings: 1 Description of patient's current relationship with siblings: one brother, "we talk, but he lives in [place] and we don't talk that often."  Did patient suffer any verbal/emotional/physical/sexual abuse as a child?: No Did patient suffer from severe childhood neglect?: No Has patient ever been sexually abused/assaulted/raped as an adolescent or adult?: No Was the patient ever a victim of a crime or a disaster?: No Witnessed domestic violence?: No Has patient been effected by domestic violence as an adult?: No  CCA Part Two B  Employment/Work Situation: Employment / Work Copywriter, advertising Employment situation: Retired Archivist job has been impacted by current illness: No What is the longest time patient has a held a job?: 35 years Where was the patient employed at that time?: Engineer, maintenance (IT) Did You Receive Any Psychiatric Treatment/Services While in Passenger transport manager?: (N/A) Are There Guns or Other Weapons in [place]?: Yes Types of Guns/Weapons: firearms Are These Psychologist, educational?: Yes Who Could Verify You Are Able To Have These Secured:: N/A  Education: Museum/gallery curator Currently Attending: N/A Last Grade Completed: 16 Name of [employer]: [employer]  Did [employer] Graduate From [employer]?: Yes Did Physicist, medical?: Yes What Type of College Degree Do you Have?: CPA Did You Attend Graduate School?: No What Was Your Major?: N/A Did You Have Any Special Interests In School?: N/A Did You Have An Individualized Education Program  (IIEP): No Did You Have Any Difficulty At School?: No  Religion: Religion/Spirituality Are You A Religious Person?: Yes What is Your Religious Affiliation?: Lutheran How Might This Affect Treatment?: N/A  Leisure/Recreation: Leisure / Recreation Leisure and Hobbies: "I don't know that I like to do anything for fun."   Exercise/Diet: Exercise/Diet Do You Exercise?: No Have You Gained or Lost A Significant Amount of Weight in the Past Six Months?: Yes-Lost Number of Pounds Lost?: 30 Do You Follow a Special Diet?: No Do You Have Any Trouble Sleeping?: Yes Explanation of Sleeping Difficulties: trouble sleeping  CCA Part Two C  Alcohol/Drug Use: Alcohol / Drug Use Pain Medications: SEE MAR Prescriptions: SEE MAR Over the Counter: SEE MAR History of alcohol / drug use?: No history of alcohol / drug abuse                      CCA Part Three  ASAM's:  Six Dimensions of Multidimensional Assessment  Dimension 1:  Acute Intoxication and/or Withdrawal Potential:     Dimension 2:  Biomedical Conditions and Complications:     Dimension 3:  Emotional, Behavioral, or Cognitive Conditions and Complications:     Dimension 4:  Readiness to Change:     Dimension 5:  Relapse, Continued use, or Continued Problem Potential:     Dimension 6:  Recovery/Living Environment:      Substance use Disorder (SUD)    Social Function:  Social Functioning Social Maturity: Isolates Social Judgement: Normal  Stress:  Stress Stressors: Transitions Coping Ability: Normal Patient Takes Medications The Way The Doctor Instructed?: Yes Priority Risk: Low Acuity  Risk Assessment- Self-Harm  Potential: Risk Assessment For Self-Harm Potential Thoughts of Self-Harm: No current thoughts Method: No plan Availability of Means: No access/NA Additional Comments for Self-Harm Potential: N/A  Risk Assessment -Dangerous to Others Potential: Risk Assessment For Dangerous to Others Potential Method:  No Plan Availability of Means: No access or NA Intent: Vague intent or NA Notification Required: No need or identified person Additional Comments for Danger to Others Potential: N/A  DSM5 Diagnoses: Patient Active Problem List   Diagnosis Date Noted  . Fall 03/02/2018  . Pleural effusion   . Weakness 02/22/2018  . Nausea 02/21/2018  . Near syncope 10/24/2017  . Orthostatic hypotension 10/24/2017  . History of pulmonary embolus (PE) 08/12/2017  . PAF (paroxysmal atrial fibrillation) ([place]) 08/12/2017  . 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 Abnormal cardiovascular function study 01/07/2017  . Lightheadedness 12/23/2016  . Lower extremity numbness 12/23/2016  . Orthostatic dizziness 12/23/2016  . Weakness of both lower extremities 12/23/2016  . Arthritis 11/17/2016  . SOB (shortness of breath) 11/16/2016  . DDD (degenerative disc disease), lumbar 07/01/2016  . Lumbosacral spondylosis without myelopathy 01/07/2016  . Chronic bilateral low back pain without sciatica 10/10/2015  . Primary osteoarthritis of right shoulder 06/25/2015  . H/O adenomatous polyp of colon 07/20/2014  . Chronic anticoagulation 10/04/2012  . Anxiety 09/11/2011  . Cyst of left kidney 09/11/2011  . GERD (gastroesophageal reflux disease) 09/11/2011  . Heart murmur 09/11/2011  . Hyperlipidemia 09/11/2011  . Neuropathy 09/11/2011  . Pulmonary embolus ([place]) 09/11/2011    Patient Centered Plan: Patient is on the following Treatment Plan(s):  Depression  Recommendations for Services/Supports/Treatments: Recommendations for Services/Supports/Treatments Recommendations For Services/Supports/Treatments: Individual Therapy, Medication Management  Treatment Plan Summary:  Aryon was able to speak openly about his depression symptoms. We will utilize CBT moving forward to assist in managing mental health symptoms.   Referrals to Alternative Service(s): Referred to Alternative Service(s):    Place:   Date:   Time:    Referred to Alternative Service(s):   Place:   Date:   Time:    Referred to Alternative Service(s):   Place:   Date:   Time:    Referred to Alternative Service(s):   Place:   Date:   Time:     Alden Hipp, LCSW

## 2018-05-25 ENCOUNTER — Encounter: Payer: Self-pay | Admitting: Licensed Clinical Social Worker

## 2018-05-25 ENCOUNTER — Ambulatory Visit (INDEPENDENT_AMBULATORY_CARE_PROVIDER_SITE_OTHER): Payer: Medicare Other | Admitting: Licensed Clinical Social Worker

## 2018-05-25 DIAGNOSIS — F330 Major depressive disorder, recurrent, mild: Secondary | ICD-10-CM

## 2018-05-25 NOTE — Progress Notes (Signed)
   THERAPIST PROGRESS NOTE  Session Time: 1230-1300  Participation Level: Minimal  Behavioral Response: Well GroomedAlertDepressed  Type of Therapy: Individual Therapy  Treatment Goals addressed: Coping  Interventions: CBT  Summary: Mark [name]. is a 78 y.o. male who presents with continued symptoms of his diagnosis. Mark [name] was joined by his wife, Mark [name], for his session. Mark [name] reports he has been doing better since our last session, and believes the increase of Wellbutrin has contributed to the decrease in his depression symptoms. He reports he is still feeling down at times about not being able to "get around as much as I used to. I wake up, brush my teeth and shave, and then I sit down in my chair. That's my whole day." Mark [name] asked Mark [name] what he would like to do with his time--he reported he was unsure but would like to have the option to do things were they to come up. Mark [name] reported she feels her husband focuses on what he cannot do, versus what he is able to do. Mark [name] introduced the concepts of CBT, and how thoughts, feelings, and behaviors are all related. Mark [name] was able to understand the concepts presented. Mark [name] and Mark [name] discussed several examples of CBT and how changing the thought could change the emotions and behavior. Mark [name] expressed understanding and agreement with this idea. Mark [name] provided Mark [name] with a thought record, a list of thought distortions, and a handout discussing CBT to review at home. Mark [name] reviewed how to best utilize a thought record and discussed why it would be helpful for managing Mark [name]'s depression. Mark [name] expressed understanding and agreement with this idea.   Suicidal/Homicidal: No  Therapist Response: Mark [name] continues to work towards his tx goals but has not yet reached them. He is able to understand concepts presented in session, and can express understanding of how to utilize them to manage emotions. We will continue to utilize CBT moving forward to assist Mark [name] in managing his  depression symptoms.   Plan: Return again in 2 weeks.  Diagnosis: Axis I: MDD (recurrent episode, mild)    Axis II: No diagnosis    Mark [name], Mark [name] 05/25/2018

## 2018-05-30 ENCOUNTER — Ambulatory Visit: Payer: Medicare Other | Admitting: Licensed Clinical Social Worker

## 2018-06-02 ENCOUNTER — Other Ambulatory Visit: Payer: Self-pay | Admitting: Psychiatry

## 2018-06-02 DIAGNOSIS — F330 Major depressive disorder, recurrent, mild: Secondary | ICD-10-CM

## 2018-06-07 ENCOUNTER — Encounter: Payer: Self-pay | Admitting: Psychiatry

## 2018-06-07 ENCOUNTER — Other Ambulatory Visit: Payer: Self-pay

## 2018-06-07 ENCOUNTER — Ambulatory Visit (INDEPENDENT_AMBULATORY_CARE_PROVIDER_SITE_OTHER): Payer: Medicare Other | Admitting: Psychiatry

## 2018-06-07 VITALS — BP 119/76 | HR 77 | Temp 97.7°F

## 2018-06-07 DIAGNOSIS — F330 Major depressive disorder, recurrent, mild: Secondary | ICD-10-CM | POA: Diagnosis not present

## 2018-06-07 DIAGNOSIS — G4701 Insomnia due to medical condition: Secondary | ICD-10-CM | POA: Diagnosis not present

## 2018-06-07 NOTE — Progress Notes (Signed)
[facility] MD OP Progress Note  06/07/2018 5:42 PM Mark [name].  MRN:  [id number]  Chief Complaint: ' I am here for follow up." Chief Complaint    Follow-up; Medication Refill     HPI: Mark [name] is a 78 year old Caucasian male, retired, lives in [place], has a history of depression, pneumothorax recently after a fall, history of syncopal episodes, BPH, history of basal cell carcinoma, hyperlipidemia, vitamin B12 deficiency on replacement, restless leg syndrome, history of pulmonary embolism, presented to clinic today for a follow-up visit.  Patient's wife presented along with him and provided collateral information.  Patient today reports he is making progress on the current medication regimen.  He does not feel as depressed as he used to before.  He reports he is tolerating the medications well.  He reports sleep is good.  He continues to be in physical therapy which is going well.  He continues to be able to walk with a walker.  Patient reports therapy sessions with Merleen Nicely is going well.  P wife patient seems to be more engaged and less depressed. Visit Diagnosis:    ICD-10-CM   1. MDD (major depressive disorder), recurrent episode, mild ([place]) F33.0   2. Insomnia due to medical condition G47.01     Past Psychiatric History: Reviewed past psychiatric history from my progress note on 04/20/2018.  Past trials of Prozac, Wellbutrin.  Past Medical History:  Past Medical History:  Diagnosis Date  . Anxiety   . Atrial fibrillation ([place])   . Back pain, chronic   . Colon polyp   . Cyst of left kidney   . GERD (gastroesophageal reflux disease)   . Heart murmur   . Hyperlipemia   . Mitral valve prolapse   . Neuromuscular disorder ([place])   . Neuropathy   . Osteoarthritis   . Pulmonary emboli ([place])   . Spinal stenosis   . Thyroid nodule   . Vertigo     Past Surgical History:  Procedure Laterality Date  . APPENDECTOMY    . BACK SURGERY    . COLONOSCOPY N/A 08/15/2014   Procedure:  COLONOSCOPY;  Surgeon: [name], MD;  Location: [facility] ENDOSCOPY;  Service: Endoscopy;  Laterality: N/A;  . ESOPHAGOGASTRODUODENOSCOPY N/A 08/15/2014   Procedure: ESOPHAGOGASTRODUODENOSCOPY (EGD);  Surgeon: [name], MD;  Location: [facility] ENDOSCOPY;  Service: Endoscopy;  Laterality: N/A;  . TONSILLECTOMY      Family Psychiatric History: Reviewed family psychiatric history from my progress note on 04/20/2018.  Family History:  Family History  Problem Relation Age of Onset  . Stroke Mother   . COPD Father     Social History: Reviewed social history from my progress note on 04/20/2018. Social History   Socioeconomic History  . Marital status: Married    Spouse name: gail  . Number of children: 2  . Years of education: Not on file  . Highest education level: Master's degree (e.g., MA, MS, MEng, MEd, MSW, MBA)  Occupational History  . Not on file  Social Needs  . Financial resource strain: Not hard at all  . Food insecurity:    Worry: Never true    Inability: Never true  . Transportation needs:    Medical: No    Non-medical: No  Tobacco Use  . Smoking status: Former Smoker    Last attempt to quit: 04/20/1958    Years since quitting: 60.1  . Smokeless tobacco: Never Used  Substance and Sexual Activity  . Alcohol use: Yes  Alcohol/week: 1.0 standard drinks    Types: 1 Glasses of wine per week  . Drug use: Never  . Sexual activity: Not Currently  Lifestyle  . Physical activity:    Days per week: 2 days    Minutes per session: 60 min  . Stress: Not at all  Relationships  . Social connections:    Talks on phone: Not on file    Gets together: Not on file    Attends religious service: More than 4 times per year    Active member of club or organization: Yes    Attends meetings of clubs or organizations: More than 4 times per year    Relationship status: Married  Other Topics Concern  . Not on file  Social History Narrative  . Not on file    Allergies:   Allergies  Allergen Reactions  . Ciprofloxacin Other (See Comments)  . Erythromycin Swelling  . Keflex [Cephalexin] Swelling  . Penicillins Swelling    Has patient had a PCN reaction causing immediate rash, facial/tongue/throat swelling, SOB or lightheadedness with hypotension: Yes Has patient had a PCN reaction causing severe rash involving mucus membranes or skin necrosis: No Has patient had a PCN reaction that required hospitalization: Unknown Has patient had a PCN reaction occurring within the last 10 years: No If all of the above answers are "NO", then may proceed with Cephalosporin use.     Metabolic Disorder Labs: No results found for: HGBA1C, MPG No results found for: PROLACTIN No results found for: CHOL, TRIG, HDL, CHOLHDL, VLDL, LDLCALC Lab Results  Component Value Date   TSH 0.866 02/21/2018    Therapeutic Level Labs: No results found for: LITHIUM No results found for: VALPROATE No components found for:  CBMZ  Current Medications: Current Outpatient Medications  Medication Sig Dispense Refill  . apixaban (ELIQUIS) 2.5 MG TABS tablet Take 2.5 mg by mouth 2 (two) times daily.    Marland Kitchen buPROPion (WELLBUTRIN XL) 150 MG 24 hr tablet TAKE ONE TABLET BY MOUTH EVERY DAY 30 tablet 1  . clotrimazole-betamethasone (LOTRISONE) cream Apply 1 application topically 2 (two) times daily.     . Coenzyme Q10 (CO Q 10 PO) Take 1 Dose by mouth daily.     . Cyanocobalamin (B-12) 5000 MCG CAPS Take 5,000 mg by mouth daily. (Patient taking differently: Take 5,000 mcg by mouth daily. ) 30 capsule 0  . esomeprazole (NEXIUM) 40 MG capsule Take 40 mg by mouth daily.     . feeding supplement, ENSURE ENLIVE, (ENSURE ENLIVE) LIQD Take 237 mLs by mouth 2 (two) times daily between meals. 237 mL 12  . finasteride (PROSCAR) 5 MG tablet Take 5 mg by mouth daily.     Marland Kitchen FLUoxetine HCl (PROZAC PO) Take 60 mg by mouth daily.     Marland Kitchen nystatin-triamcinolone ointment (MYCOLOG) Apply 1 application topically 2  (two) times daily. 30 g 0  . rOPINIRole (REQUIP) 0.5 MG tablet Take 1 tablet by mouth at bedtime.    . simvastatin (ZOCOR) 40 MG tablet Take 40 mg by mouth at bedtime.     . tamsulosin (FLOMAX) 0.4 MG CAPS capsule Take 0.4 mg by mouth daily.      Current Facility-Administered Medications  Medication Dose Route Frequency Provider Last Rate Last Dose  . nystatin (MYCOSTATIN/NYSTOP) topical powder   Topical BID Hollice Espy, MD         Musculoskeletal: Strength & Muscle Tone: within normal limits Gait & Station: in a wheelchair Patient leans: N/A  Psychiatric Specialty Exam: Review of Systems  Psychiatric/Behavioral: Positive for depression (improving).  All other systems reviewed and are negative.   Blood pressure 119/76, pulse 77, temperature 97.7 F (36.5 C), temperature source Oral.There is no height or weight on file to calculate BMI.  General Appearance: Casual  Eye Contact:  Good  Speech:  Clear and Coherent  Volume:  Normal  Mood:  Dysphoric improving  Affect:  Congruent  Thought Process:  Goal Directed and Descriptions of Associations: Intact  Orientation:  Full (Time, Place, and Person)  Thought Content: Logical   Suicidal Thoughts:  No  Homicidal Thoughts:  No  Memory:  Immediate;   Fair Recent;   Fair Remote;   Fair  Judgement:  Fair  Insight:  Fair  Psychomotor Activity:  Normal  Concentration:  Concentration: Fair and Attention Span: Fair  Recall:  [employer] of Knowledge: Fair  Language: Fair  Akathisia:  No  Handed:  Right  AIMS (if indicated): na  Assets:  Communication Skills Desire for Improvement Social Support  ADL's:  Intact  Cognition: WNL  Sleep:  Fair   Screenings:   Assessment and Plan: Clyde is a 78 yr old male who lives in [place], married, retired, has a history of depression, history of BPH, history of falls, syncopal episodes, RLS, basal cell carcinoma, hyperlipidemia, presented to clinic today for a follow-up visit.  Patient  is biologically predisposed given his multiple health problems.  He also has psychosocial stressors with physical limitation.  Patient however is currently making progress on the current medication regimen.  Plan as noted below.  Plan MDD-improving Wellbutrin extended release 150 mg p.o. daily in the morning Prozac 60 mg p.o. daily. Continue psychotherapy sessions with Ms. Alden Hipp.  For insomnia-improving Patient will continue sleep hygiene techniques.  He however has BPH, frequent urination which is affecting his sleep.  Follow-up in clinic in 2 months or sooner if needed.  I have spent atleast 15 minutes face to face with patient today. More than 50 % of the time was spent for psychoeducation and supportive psychotherapy and care coordination.  This note was generated in part or whole with voice recognition software. Voice recognition is usually quite accurate but there are transcription errors that can and very often do occur. I apologize for any typographical errors that were not detected and corrected.        Ursula Alert, MD 06/07/2018, 5:42 PM

## 2018-06-17 ENCOUNTER — Ambulatory Visit: Payer: Medicare Other | Admitting: Licensed Clinical Social Worker

## 2018-06-23 ENCOUNTER — Other Ambulatory Visit: Payer: Self-pay | Admitting: Urology

## 2018-06-23 DIAGNOSIS — N401 Enlarged prostate with lower urinary tract symptoms: Secondary | ICD-10-CM

## 2018-06-23 DIAGNOSIS — R350 Frequency of micturition: Principal | ICD-10-CM

## 2018-06-30 ENCOUNTER — Encounter: Payer: Self-pay | Admitting: Licensed Clinical Social Worker

## 2018-06-30 ENCOUNTER — Ambulatory Visit (INDEPENDENT_AMBULATORY_CARE_PROVIDER_SITE_OTHER): Payer: Medicare Other | Admitting: Licensed Clinical Social Worker

## 2018-06-30 ENCOUNTER — Other Ambulatory Visit: Payer: Self-pay

## 2018-06-30 DIAGNOSIS — F330 Major depressive disorder, recurrent, mild: Secondary | ICD-10-CM | POA: Diagnosis not present

## 2018-06-30 NOTE — Progress Notes (Signed)
   THERAPIST PROGRESS NOTE Virtual Visit via Telephone Note  I connected with [name]. on 06/30/18 at 10:00 AM EDT by telephone and verified that I am speaking with the correct person using two identifiers.   I discussed the limitations, risks, security and privacy concerns of performing an evaluation and management service by telephone and the availability of in person appointments. I also discussed with the patient that there may be a patient responsible charge related to this service. The patient expressed understanding and agreed to proceed.  I discussed the assessment and treatment plan with the patient. The patient was provided an opportunity to ask questions and all were answered. The patient agreed with the plan and demonstrated an understanding of the instructions.   The patient was advised to call back or seek an in-person evaluation if the symptoms worsen or if the condition fails to improve as anticipated.   Alden Hipp, LCSW     Session Time: 1000  Participation Level: Active  Behavioral Response: NAAlertNA  Type of Therapy: Individual Therapy  Treatment Goals addressed: Anxiety  Interventions: CBT  Summary: Zarian Colpitts. is a 78 y.o. male who presents with continued symptoms of his diagnosis. Kamil reports things have been going well since our last session. He reports his anxiety has been more manageable over the last two weeks and cites the medication increase as assisting in elevating his mood and decreasing anxiety symptoms. Pacen reports he completed the though record assigned by LCSW at our last session, and determined his primary source of anxiety is "not being able to do everything I used to do. I'm immobile." LCSW reviewed the connections between thoughts and emotions, and encouraged Akshay to reframe his negative thoughts when he finds himself thinking them. LCSW gave the following example: Instead of thinking, "I'm old and can't do anything anymore,"  thinking, "I worked hard my whole life and now I get a break." LCSW reviewed how shifting our thoughts can change our emotions, and Ryett expressed understanding with this idea and how it can apply to him. Javis was in agreement with continuing CBT and challenging negative thoughts moving forward. Ziaire's wife joined him for his call. She reported agreeing with Arby's assessment of decreased anxiety and improved mood. They reported quarantine has not effected them negatively, and they have been getting along very well while in the house.  Suicidal/Homicidal: No  Therapist Response: Wilferd continues to work towards his tx goals but has not yet reached them. He reports a decrease in anxiety since our last session and is able to stay safe/calm in the moment. We will continue to utilize CBT to assist Fiore in addressing his negative thoughts in the moment.   Plan: Return again in 4 weeks.  Diagnosis: Axis I: MDD    Axis II: No diagnosis    Alden Hipp, LCSW 06/30/2018

## 2018-07-25 ENCOUNTER — Other Ambulatory Visit: Payer: Self-pay | Admitting: Psychiatry

## 2018-07-25 DIAGNOSIS — F330 Major depressive disorder, recurrent, mild: Secondary | ICD-10-CM

## 2018-08-01 ENCOUNTER — Ambulatory Visit (INDEPENDENT_AMBULATORY_CARE_PROVIDER_SITE_OTHER): Payer: Medicare Other | Admitting: Licensed Clinical Social Worker

## 2018-08-01 ENCOUNTER — Encounter: Payer: Self-pay | Admitting: Licensed Clinical Social Worker

## 2018-08-01 ENCOUNTER — Other Ambulatory Visit: Payer: Self-pay

## 2018-08-01 DIAGNOSIS — F330 Major depressive disorder, recurrent, mild: Secondary | ICD-10-CM | POA: Diagnosis not present

## 2018-08-01 NOTE — Progress Notes (Signed)
Virtual Visit via Telephone Note  I connected with Mark [name]. on 08/01/18 at  2:30 PM EDT by telephone and verified that I am speaking with the correct person using two identifiers.   I discussed the limitations, risks, security and privacy concerns of performing an evaluation and management service by telephone and the availability of in person appointments. I also discussed with the patient that there may be a patient responsible charge related to this service. The patient expressed understanding and agreed to proceed.  I discussed the assessment and treatment plan with the patient. The patient was provided an opportunity to ask questions and all were answered. The patient agreed with the plan and demonstrated an understanding of the instructions.   The patient was advised to call back or seek an in-person evaluation if the symptoms worsen or if the condition fails to improve as anticipated.  I provided 20 minutes of non-face-to-face time during this encounter.   Mark [name], Mark [name]    THERAPIST PROGRESS NOTE  Session Time: 1430  Participation Level: Minimal  Behavioral Response: NAAlertNA  Type of Therapy: Individual Therapy  Treatment Goals addressed: Anxiety  Interventions: CBT  Summary: Mark [name]. is a 78 y.o. male who presents with continued symptoms of his diagnosis. Mark [name] was joined by his wife, Mark [name], for his appointment. Mark [name] reports a decrease in anxiety and depression symptoms, and reports he has come to accept the situation (of needing to use a walker when moving around). Mark [name] reports he has contacted his physical therapist to begin working on exercises that could allow him more mobility, and hopes to return to a cane instead of a walker. Mark [name] validated Mark [name]'s feelings and highlighted the ways he is willing to put in work to reach his goal. Mark [name] also asked Mark [name] what he would do/how he would feel if/when he is told he cannot return to using a cane. Mark [name] reported he  would then fully accept the use of a walker. He agreed the physical therapist would be a last effort to return to more mobility. Additionally, he reports he and his wife have been going on walks (where his wife holds on to him to ensure no falls) to work on increasing movement in that way. Mark [name] encouraged Mark [name] to continue doing what he is able to do until he can return to physical therapy. Mark [name] agrees Mark [name] has demonstrated an improved mood since being on medications. Mark [name] requested moving therapy sessions to an as needed basis. Mark [name] was in agreement with this plan as Ariston has demonstrated significant improvement over the last few sessions.   Suicidal/Homicidal: No  Therapist Response: Mark [name] has met his treatment goals at this time, and will return to therapy when/if he feels it is needed.   Plan: Mark [name] will return to therapy when/if he feels it is needed.    Diagnosis: Axis I: MDD    Axis II: No diagnosis    Mark [name], Mark [name] 08/01/2018

## 2018-08-04 ENCOUNTER — Encounter: Payer: Self-pay | Admitting: Psychiatry

## 2018-08-04 ENCOUNTER — Ambulatory Visit (INDEPENDENT_AMBULATORY_CARE_PROVIDER_SITE_OTHER): Payer: Medicare Other | Admitting: Psychiatry

## 2018-08-04 ENCOUNTER — Other Ambulatory Visit: Payer: Self-pay

## 2018-08-04 DIAGNOSIS — F330 Major depressive disorder, recurrent, mild: Secondary | ICD-10-CM

## 2018-08-04 DIAGNOSIS — G4701 Insomnia due to medical condition: Secondary | ICD-10-CM

## 2018-08-04 MED ORDER — FLUOXETINE HCL 40 MG PO CAPS: 40.0000 mg | ORAL_CAPSULE | Freq: Every day | ORAL | 1 refills | Status: DC

## 2018-08-04 MED ORDER — BUPROPION HCL ER (XL) 150 MG PO TB24: 150.0000 mg | ORAL_TABLET | Freq: Every day | ORAL | 1 refills | Status: DC

## 2018-08-04 MED ORDER — FLUOXETINE HCL 20 MG PO CAPS: 20.0000 mg | ORAL_CAPSULE | Freq: Every day | ORAL | 1 refills | Status: DC

## 2018-08-04 NOTE — Progress Notes (Signed)
Virtual Visit via Telephone Note  I connected with Mark [name]. on 08/04/18 at  2:00 PM EDT by telephone and verified that I am speaking with the correct person using two identifiers.   I discussed the limitations, risks, security and privacy concerns of performing an evaluation and management service by telephone and the availability of in person appointments. I also discussed with the patient that there may be a patient responsible charge related to this service. The patient expressed understanding and agreed to proceed.    I discussed the assessment and treatment plan with the patient. The patient was provided an opportunity to ask questions and all were answered. The patient agreed with the plan and demonstrated an understanding of the instructions.   The patient was advised to call back or seek an in-person evaluation if the symptoms worsen or if the condition fails to improve as anticipated.   [place] MD  OP Progress Note  08/04/2018 2:14 PM Mark [name].  MRN:  [id number]  Chief Complaint:  Chief Complaint    Follow-up     HPI: Mark [name] is a 78 year old Caucasian male, retired, lives in [place], has a history of depression, pneumothorax recently after a fall, history of syncopal episodes, BPH, history of basal cell carcinoma, hyperlipidemia, vitamin B12 deficiency on replacement, restless leg syndrome, history of pulmonary embolism was evaluated today.   Patient today reports he is doing well on the medications.  He denies any significant anxiety or depressive symptoms.  He reports sleep is good.  He appeared to be alert, oriented and pleasant.  Collateral information was obtained from his wife-Mark [name] who reported patient is doing well.  He continues to take his medications daily.  He met with Ms. Mark [name] recently for an appointment and it went well.  Patient denies any suicidality, homicidality or perceptual disturbances.  Visit Diagnosis:    ICD-10-CM   1. MDD (major  depressive disorder), recurrent episode, mild (HCC) F33.0 FLUoxetine (PROZAC) 40 MG capsule    FLUoxetine (PROZAC) 20 MG capsule    buPROPion (WELLBUTRIN XL) 150 MG 24 hr tablet  2. Insomnia due to medical condition G47.01     Past Psychiatric History: Reviewed past psychiatric history from my progress note on 04/20/2018.  Past trials of Prozac, Wellbutrin.  Past Medical History:  Past Medical History:  Diagnosis Date  . Anxiety   . Atrial fibrillation ([place])   . Back pain, chronic   . Colon polyp   . Cyst of left kidney   . GERD (gastroesophageal reflux disease)   . Heart murmur   . Hyperlipemia   . Mitral valve prolapse   . Neuromuscular disorder ([place])   . Neuropathy   . Osteoarthritis   . Pulmonary emboli ([place])   . Spinal stenosis   . Thyroid nodule   . Vertigo     Past Surgical History:  Procedure Laterality Date  . APPENDECTOMY    . BACK SURGERY    . COLONOSCOPY N/A 08/15/2014   Procedure: COLONOSCOPY;  Surgeon: [name], MD;  Location[facility] ENDOSCOPY;  Service: Endoscopy;  Laterality: N/A;  . ESOPHAGOGASTRODUODENOSCOPY N/A 08/15/2014   Procedure: ESOPHAGOGASTRODUODENOSCOPY (EGD);  Surgeon: [name], MD;  Location: [facility] ENDOSCOPY;  Service: Endoscopy;  Laterality: N/A;  . TONSILLECTOMY      Family Psychiatric History: Reviewed family psychiatric history from my progress note on 04/20/2018.  Family History:  Family History  Problem Relation Age of Onset  . Stroke Mother   . COPD  Father     Social History: Reviewed social history from my progress note on 04/20/2018. Social History   Socioeconomic History  . Marital status: Married    Spouse name: Mark [name]  . Number of children: 2  . Years of education: Not on file  . Highest education level: Master's degree (e.g., MA, MS, MEng, MEd, MSW, MBA)  Occupational History  . Not on file  Social Needs  . Financial resource strain: Not hard at all  . Food insecurity:    Worry: Never true    Inability:  Never true  . Transportation needs:    Medical: No    Non-medical: No  Tobacco Use  . Smoking status: Former Smoker    Last attempt to quit: 04/20/1958    Years since quitting: 60.3  . Smokeless tobacco: Never Used  Substance and Sexual Activity  . Alcohol use: Yes    Alcohol/week: 1.0 standard drinks    Types: 1 Glasses of wine per week  . Drug use: Never  . Sexual activity: Not Currently  Lifestyle  . Physical activity:    Days per week: 2 days    Minutes per session: 60 min  . Stress: Not at all  Relationships  . Social connections:    Talks on phone: Not on file    Gets together: Not on file    Attends religious service: More than 4 times per year    Active member of club or organization: Yes    Attends meetings of clubs or organizations: More than 4 times per year    Relationship status: Married  Other Topics Concern  . Not on file  Social History Narrative  . Not on file    Allergies:  Allergies  Allergen Reactions  . Ciprofloxacin Other (See Comments)  . Erythromycin Swelling  . Keflex [Cephalexin] Swelling  . Penicillins Swelling    Has patient had a PCN reaction causing immediate rash, facial/tongue/throat swelling, SOB or lightheadedness with hypotension: Yes Has patient had a PCN reaction causing severe rash involving mucus membranes or skin necrosis: No Has patient had a PCN reaction that required hospitalization: Unknown Has patient had a PCN reaction occurring within the last 10 years: No If all of the above answers are "NO", then may proceed with Cephalosporin use.     Metabolic Disorder Labs: No results found for: HGBA1C, MPG No results found for: PROLACTIN No results found for: CHOL, TRIG, HDL, CHOLHDL, VLDL, LDLCALC Lab Results  Component Value Date   TSH 0.866 02/21/2018    Therapeutic Level Labs: No results found for: LITHIUM No results found for: VALPROATE No components found for:  CBMZ  Current Medications: Current Outpatient  Medications  Medication Sig Dispense Refill  . apixaban (ELIQUIS) 2.5 MG TABS tablet Take 2.5 mg by mouth 2 (two) times daily.    Marland Kitchen buPROPion (WELLBUTRIN XL) 150 MG 24 hr tablet Take 1 tablet (150 mg total) by mouth daily. 90 tablet 1  . clotrimazole-betamethasone (LOTRISONE) cream Apply 1 application topically 2 (two) times daily.     . Coenzyme Q10 (CO Q 10 PO) Take 1 Dose by mouth daily.     . Cyanocobalamin (B-12) 5000 MCG CAPS Take 5,000 mg by mouth daily. (Patient taking differently: Take 5,000 mcg by mouth daily. ) 30 capsule 0  . esomeprazole (NEXIUM) 40 MG capsule Take 40 mg by mouth daily.     . feeding supplement, ENSURE ENLIVE, (ENSURE ENLIVE) LIQD Take 237 mLs by mouth 2 (two) times daily  between meals. 237 mL 12  . finasteride (PROSCAR) 5 MG tablet Take 5 mg by mouth daily.     Marland Kitchen FLUoxetine (PROZAC) 20 MG capsule Take 1 capsule (20 mg total) by mouth daily. To be combined with 40 mg 90 capsule 1  . FLUoxetine (PROZAC) 40 MG capsule Take 1 capsule (40 mg total) by mouth daily. To be combined with 20 mg 90 capsule 1  . FLUoxetine HCl (PROZAC PO) Take 60 mg by mouth daily.     Marland Kitchen nystatin-triamcinolone ointment (MYCOLOG) APPLY TOPICALLY TWICE DAILY 30 g 0  . rOPINIRole (REQUIP) 0.5 MG tablet Take 1 tablet by mouth at bedtime.    . simvastatin (ZOCOR) 40 MG tablet Take 40 mg by mouth at bedtime.     . tamsulosin (FLOMAX) 0.4 MG CAPS capsule Take 0.4 mg by mouth daily.      Current Facility-Administered Medications  Medication Dose Route Frequency Provider Last Rate Last Dose  . nystatin (MYCOSTATIN/NYSTOP) topical powder   Topical BID Hollice Espy, MD         Musculoskeletal: Strength & Muscle Tone: UTA Gait & Station: UTA Patient leans: N/A  Psychiatric Specialty Exam: Review of Systems  Psychiatric/Behavioral: The patient is not nervous/anxious.   All other systems reviewed and are negative.   There were no vitals taken for this visit.There is no height or weight on  file to calculate BMI.  General Appearance: UTA  Eye Contact:  UTA  Speech:  Normal Rate  Volume:  Normal  Mood:  Euthymic  Affect:  UTA  Thought Process:  Goal Directed and Descriptions of Associations: Intact  Orientation:  Full (Time, Place, and Person)  Thought Content: Logical   Suicidal Thoughts:  No  Homicidal Thoughts:  No  Memory:  Immediate;   Fair Recent;   Fair Remote;   Fair  Judgement:  Fair  Insight:  Fair  Psychomotor Activity:  UTA  Concentration:  Concentration: Good and Attention Span: Fair  Recall:  [employer] of Knowledge: Good  Language: Good  Akathisia:  No  Handed:  Right  AIMS (if indicated): denies tremors, rigidity  Assets:  Communication Skills Desire for Improvement Housing  ADL's:  Intact  Cognition: WNL  Sleep:  Fair   Screenings:   Assessment and Plan: Gerhard is a 78 year old male, lives in [place], married, retired, has a history of depression, history of BPH, history of falls, syncopal episodes, RLS, basal cell carcinoma, hyperlipidemia was evaluated by phone today.  Patient is biologically predisposed given his multiple health problems.  He also has psychosocial struggles of physical limitations.  However is currently doing well on the current medication regimen.  Patient will continue to benefit from medications as noted below.  Plan MDD-improving Wellbutrin extended release 150 mg p.o. daily in the morning Prozac 60 mg p.o. daily Continue psychotherapy sessions with Ms. Mark [name].  Insomnia-improving Patient to continue to work on sleep hygiene techniques.  Patient with BPH, frequent urination which is also affecting his sleep.  Follow-up in clinic in 3 months or sooner if needed.  Appointment scheduled for August 14 at 10:30 AM  I have spent atleast 15 minutes non face to face with patient today. More than 50 % of the time was spent for psychoeducation and supportive psychotherapy and care coordination.  This note was  generated in part or whole with voice recognition software. Voice recognition is usually quite accurate but there are transcription errors that can and very often do occur. I apologize  for any typographical errors that were not detected and corrected.       Ursula Alert, MD 08/04/2018, 2:14 PM

## 2018-08-22 ENCOUNTER — Other Ambulatory Visit: Payer: Self-pay | Admitting: Urology

## 2018-08-22 DIAGNOSIS — N401 Enlarged prostate with lower urinary tract symptoms: Secondary | ICD-10-CM

## 2018-11-04 ENCOUNTER — Encounter: Payer: Self-pay | Admitting: Psychiatry

## 2018-11-04 ENCOUNTER — Ambulatory Visit (INDEPENDENT_AMBULATORY_CARE_PROVIDER_SITE_OTHER): Payer: Medicare Other | Admitting: Psychiatry

## 2018-11-04 ENCOUNTER — Other Ambulatory Visit: Payer: Self-pay

## 2018-11-04 DIAGNOSIS — F3341 Major depressive disorder, recurrent, in partial remission: Secondary | ICD-10-CM

## 2018-11-04 DIAGNOSIS — F330 Major depressive disorder, recurrent, mild: Secondary | ICD-10-CM | POA: Insufficient documentation

## 2018-11-04 DIAGNOSIS — G4701 Insomnia due to medical condition: Secondary | ICD-10-CM | POA: Diagnosis not present

## 2018-11-04 NOTE — Progress Notes (Signed)
Virtual Visit via Telephone Note  I connected with Mark [name]. on 11/04/18 at 10:30 AM EDT by telephone and verified that I am speaking with the correct person using two identifiers.   I discussed the limitations, risks, security and privacy concerns of performing an evaluation and management service by telephone and the availability of in person appointments. I also discussed with the patient that there may be a patient responsible charge related to this service. The patient expressed understanding and agreed to proceed.   I discussed the assessment and treatment plan with the patient. The patient was provided an opportunity to ask questions and all were answered. The patient agreed with the plan and demonstrated an understanding of the instructions.   The patient was advised to call back or seek an in-person evaluation if the symptoms worsen or if the condition fails to improve as anticipated.   [place] MD OP Progress Note  11/04/2018 12:23 PM Mark [name].  MRN:  [id number]  Chief Complaint:  Chief Complaint    Follow-up     HPI: Mark [name] is a 78 year old Caucasian male, retired, lives in [place], has a history of depression, pneumothorax, history of syncopal episodes, BPH, history of basal cell carcinoma, hyperlipidemia, vitamin B 12 deficiency, restless leg syndrome, history of pulmonary embolism was evaluated by phone today.  Collateral information was obtained from Reynolds who reports patient is currently doing well on the current medication regimen.  Patient today denies any significant depression or anxiety symptoms.  He reports he has been doing well on the current medication regimen.  He reports sleep and appetite is good.  He denies any suicidality, homicidality or perceptual disturbances.  He appeared to be alert and oriented and was able to answer questions appropriately.  He denies any other concerns today. Visit Diagnosis:    ICD-10-CM   1. MDD (major depressive  disorder), recurrent, in partial remission ([place])  F33.41   2. Insomnia due to medical condition  G47.01     Past Psychiatric History: I have reviewed past psychiatric history from my progress note on 04/20/2018.  Past trials of Prozac, Wellbutrin  Past Medical History:  Past Medical History:  Diagnosis Date  . Anxiety   . Atrial fibrillation ([place])   . Back pain, chronic   . Colon polyp   . Cyst of left kidney   . GERD (gastroesophageal reflux disease)   . Heart murmur   . Hyperlipemia   . Mitral valve prolapse   . Neuromuscular disorder ([place])   . Neuropathy   . Osteoarthritis   . Pulmonary emboli ([place])   . Spinal stenosis   . Thyroid nodule   . Vertigo     Past Surgical History:  Procedure Laterality Date  . APPENDECTOMY    . BACK SURGERY    . COLONOSCOPY N/A 08/15/2014   Procedure: COLONOSCOPY;  Surgeon: [name], MD;  Location: [facility] ENDOSCOPY;  Service: Endoscopy;  Laterality: N/A;  . ESOPHAGOGASTRODUODENOSCOPY N/A 08/15/2014   Procedure: ESOPHAGOGASTRODUODENOSCOPY (EGD);  Surgeon: [name], MD;  Location: [facility] ENDOSCOPY;  Service: Endoscopy;  Laterality: N/A;  . TONSILLECTOMY      Family Psychiatric History: I have reviewed family psychiatric history from my progress note on 04/20/2018  Family History:  Family History  Problem Relation Age of Onset  . Stroke Mother   . COPD Father     Social History: I have reviewed social history from my progress note on 04/20/2018 Social History   Socioeconomic History  .  Marital status: Married    Spouse name: gail  . Number of children: 2  . Years of education: Not on file  . Highest education level: Master's degree (e.g., MA, MS, MEng, MEd, MSW, MBA)  Occupational History  . Not on file  Social Needs  . Financial resource strain: Not hard at all  . Food insecurity    Worry: Never true    Inability: Never true  . Transportation needs    Medical: No    Non-medical: No  Tobacco Use  . Smoking status:  Former Smoker    Quit date: 04/20/1958    Years since quitting: 60.5  . Smokeless tobacco: Never Used  Substance and Sexual Activity  . Alcohol use: Yes    Alcohol/week: 1.0 standard drinks    Types: 1 Glasses of wine per week  . Drug use: Never  . Sexual activity: Not Currently  Lifestyle  . Physical activity    Days per week: 2 days    Minutes per session: 60 min  . Stress: Not at all  Relationships  . Social Herbalist on phone: Not on file    Gets together: Not on file    Attends religious service: More than 4 times per year    Active member of club or organization: Yes    Attends meetings of clubs or organizations: More than 4 times per year    Relationship status: Married  Other Topics Concern  . Not on file  Social History Narrative  . Not on file    Allergies:  Allergies  Allergen Reactions  . Ciprofloxacin Other (See Comments)  . Erythromycin Swelling  . Keflex [Cephalexin] Swelling  . Penicillins Swelling    Has patient had a PCN reaction causing immediate rash, facial/tongue/throat swelling, SOB or lightheadedness with hypotension: Yes Has patient had a PCN reaction causing severe rash involving mucus membranes or skin necrosis: No Has patient had a PCN reaction that required hospitalization: Unknown Has patient had a PCN reaction occurring within the last 10 years: No If all of the above answers are "NO", then may proceed with Cephalosporin use.     Metabolic Disorder Labs: No results found for: HGBA1C, MPG No results found for: PROLACTIN No results found for: CHOL, TRIG, HDL, CHOLHDL, VLDL, LDLCALC Lab Results  Component Value Date   TSH 0.866 02/21/2018    Therapeutic Level Labs: No results found for: LITHIUM No results found for: VALPROATE No components found for:  CBMZ  Current Medications: Current Outpatient Medications  Medication Sig Dispense Refill  . apixaban (ELIQUIS) 2.5 MG TABS tablet Take 2.5 mg by mouth 2 (two) times  daily.    Marland Kitchen buPROPion (WELLBUTRIN XL) 150 MG 24 hr tablet Take 1 tablet (150 mg total) by mouth daily. 90 tablet 1  . clotrimazole-betamethasone (LOTRISONE) cream Apply 1 application topically 2 (two) times daily.     . Coenzyme Q10 (CO Q 10 PO) Take 1 Dose by mouth daily.     . Cyanocobalamin (B-12) 5000 MCG CAPS Take 5,000 mg by mouth daily. (Patient taking differently: Take 5,000 mcg by mouth daily. ) 30 capsule 0  . esomeprazole (NEXIUM) 40 MG capsule Take 40 mg by mouth daily.     . feeding supplement, ENSURE ENLIVE, (ENSURE ENLIVE) LIQD Take 237 mLs by mouth 2 (two) times daily between meals. 237 mL 12  . finasteride (PROSCAR) 5 MG tablet Take 5 mg by mouth daily.     Marland Kitchen FLUoxetine (PROZAC) 20  MG capsule Take 1 capsule (20 mg total) by mouth daily. To be combined with 40 mg 90 capsule 1  . FLUoxetine (PROZAC) 40 MG capsule Take 1 capsule (40 mg total) by mouth daily. To be combined with 20 mg 90 capsule 1  . Multiple Vitamin (MULTI-VITAMIN) tablet Take by mouth.    . nystatin (MYCOSTATIN/NYSTOP) powder     . nystatin-triamcinolone (MYCOLOG II) cream APPLY TO AFFECTED AREAS TOPICALLY TWICE DAILY 30 g 3  . rOPINIRole (REQUIP) 0.5 MG tablet Take 1 tablet by mouth at bedtime.    . simvastatin (ZOCOR) 40 MG tablet Take 40 mg by mouth at bedtime.     . tamsulosin (FLOMAX) 0.4 MG CAPS capsule Take 0.4 mg by mouth daily.      Current Facility-Administered Medications  Medication Dose Route Frequency Provider Last Rate Last Dose  . nystatin (MYCOSTATIN/NYSTOP) topical powder   Topical BID Hollice Espy, MD         Musculoskeletal: Strength & Muscle Tone: UTA Gait & Station: UTA Patient leans: N/A  Psychiatric Specialty Exam: Review of Systems  Psychiatric/Behavioral: Negative for depression, hallucinations, substance abuse and suicidal ideas. The patient is not nervous/anxious and does not have insomnia.   All other systems reviewed and are negative.   There were no vitals taken for  this visit.There is no height or weight on file to calculate BMI.  General Appearance: UTA  Eye Contact:  UTA  Speech:  Normal Rate  Volume:  Normal  Mood:  Euthymic  Affect:  UTA  Thought Process:  Goal Directed and Descriptions of Associations: Intact  Orientation:  Full (Time, Place, and Person)  Thought Content: Logical   Suicidal Thoughts:  No  Homicidal Thoughts:  No  Memory:  Immediate;   Fair Recent;   Fair Remote;   Fair  Judgement:  Fair  Insight:  Fair  Psychomotor Activity:  UTA  Concentration:  Concentration: Fair and Attention Span: Fair  Recall:  [employer] of Knowledge: Fair  Language: Fair  Akathisia:  No  Handed:  Right  AIMS (if indicated): Denies  Tremors, rigidity  Assets:  Communication Skills Desire for Improvement Social Support  ADL's:  Intact  Cognition: WNL  Sleep:  Fair   Screenings:   Assessment and Plan: Mavric is a 78 year old Caucasian male, lives in [place], has a history of depression, BPH, history of falls, syncopal episodes, RLS, basal cell carcinoma, hyperlipidemia was evaluated by phone today.  He is biologically predisposed given his multiple health problems.  He also has psychosocial stressors of physical limitations.  He however is making progress on current medication regimen and has good social support system.  Plan For MDD-in partial remission Wellbutrin extended release 150 mg p.o. daily in the morning Prozac 60 mg p.o. daily Continue CBT with Ms. Alden Hipp as needed  For insomnia-stable Patient to continue to work on sleep hygiene techniques.  He does have BPH-frequent urination which also affects his sleep.  Follow-up in clinic in 4 to 5 months or sooner if needed.  December 30 at 10:30 AM  I have spent atleast 15 minutes non face to face with patient today. More than 50 % of the time was spent for psychoeducation and supportive psychotherapy and care coordination.  This note was generated in part or whole with voice  recognition software. Voice recognition is usually quite accurate but there are transcription errors that can and very often do occur. I apologize for any typographical errors that were not detected  and corrected.       Ursula Alert, MD 11/04/2018, 12:23 PM

## 2018-11-08 ENCOUNTER — Telehealth: Payer: Self-pay | Admitting: Urology

## 2018-11-08 NOTE — Telephone Encounter (Signed)
Results were reviewed.  His urinalysis was completely negative.  His culture grew Pseudomonas which is likely contaminant from his foreskin.  I would definately NOT treat this if he is having no symptoms.  Hollice Espy, MD

## 2018-11-08 NOTE — Telephone Encounter (Signed)
Patient called and stated that he went for a check up at Dr. Linton Ham office and was told he had a UTI but he does not have any symptoms. They ran a culture and it came back positive and he did not give him an ABX to treat it. The patient is concerned and feels that he should have been treated and wants to know if you would take a look at them and see if he does need an ABX or if he should come in and have another UA and culture? Again he has no symptoms, they results are in care everywhere. I wasn't sure about making an app without checking first. Please advise   Sharyn Lull

## 2018-11-09 NOTE — Telephone Encounter (Signed)
Returned call to patient. Advised of Dr Cherrie Gauze message. Patient verbalized understanding and satisfaction.

## 2019-01-25 ENCOUNTER — Other Ambulatory Visit: Payer: Self-pay

## 2019-01-25 DIAGNOSIS — R350 Frequency of micturition: Secondary | ICD-10-CM

## 2019-01-25 DIAGNOSIS — N401 Enlarged prostate with lower urinary tract symptoms: Secondary | ICD-10-CM

## 2019-01-26 ENCOUNTER — Other Ambulatory Visit: Payer: Medicare Other

## 2019-01-26 ENCOUNTER — Other Ambulatory Visit: Payer: Self-pay

## 2019-01-26 DIAGNOSIS — R350 Frequency of micturition: Secondary | ICD-10-CM

## 2019-01-26 DIAGNOSIS — N401 Enlarged prostate with lower urinary tract symptoms: Secondary | ICD-10-CM

## 2019-01-27 LAB — PSA: Prostate Specific Ag, Serum: 0.7 ng/mL (ref 0.0–4.0)

## 2019-01-31 ENCOUNTER — Encounter: Payer: Self-pay | Admitting: Urology

## 2019-01-31 ENCOUNTER — Ambulatory Visit (INDEPENDENT_AMBULATORY_CARE_PROVIDER_SITE_OTHER): Payer: Medicare Other | Admitting: Urology

## 2019-01-31 ENCOUNTER — Other Ambulatory Visit: Payer: Self-pay

## 2019-01-31 VITALS — BP 129/79 | HR 80 | Ht 76.0 in | Wt 200.0 lb

## 2019-01-31 DIAGNOSIS — R350 Frequency of micturition: Secondary | ICD-10-CM | POA: Diagnosis not present

## 2019-01-31 DIAGNOSIS — R351 Nocturia: Secondary | ICD-10-CM | POA: Diagnosis not present

## 2019-01-31 DIAGNOSIS — N401 Enlarged prostate with lower urinary tract symptoms: Secondary | ICD-10-CM

## 2019-01-31 LAB — BLADDER SCAN AMB NON-IMAGING: Scan Result: 23

## 2019-01-31 NOTE — Progress Notes (Signed)
01/31/2019 4:02 PM   Mark [name]. [birth year] [id number]  Referring provider: Tracie Harrier, MD [address] [facility] [place],  [place] [address]  Chief Complaint  Patient presents with  . Benign Prostatic Hypertrophy    HPI: 78 year old male with a personal history of BPH and nocturia who returns today for routine annual follow-up.  Overall, his daytime urinary symptoms are well controlled and stable.  He is on Flomax and finasteride.  His primary complaint is nocturia.  He gets up at times as frequently as every hour.  He believes that the volumes he voids are comparable to his daytime volumes.  He denies any lower extremity swelling.  He does snore.  He is never had a sleep study.  No recent infections dysuria or gross hematuria.  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  He reports he said no issues with his penis, does not desire circumcision, but does feel like his penis is shrinking.  Most recent PSA 0.7 on 01/2019  IPSS    Row Name 01/31/19 1500         International Prostate Symptom Score   How often have you had the sensation of not emptying your bladder?  About half the time     How often have you had to urinate less than every two hours?  About half the time     How often have you found you stopped and started again several times when you urinated?  About half the time     How often have you found it difficult to postpone urination?  Less than 1 in 5 times     How often have you had a weak urinary stream?  Not at All     How often have you had to strain to start urination?  Not at All     How many times did you typically get up at night to urinate?  5 Times     Total IPSS Score  15       Quality of Life due to urinary symptoms   If you were to spend the rest of your life with your urinary condition just the way it is now how would you feel about that?   Mixed        Score:  1-7 Mild 8-19 Moderate 20-35 Severe   PMH: Past Medical History:  Diagnosis Date  . Anxiety   . Atrial fibrillation ([place])   . Back pain, chronic   . Colon polyp   . Cyst of left kidney   . GERD (gastroesophageal reflux disease)   . Heart murmur   . Hyperlipemia   . Mitral valve prolapse   . Neuromuscular disorder ([place])   . Neuropathy   . Osteoarthritis   . Pulmonary emboli ([place])   . Spinal stenosis   . Thyroid nodule   . Vertigo     Surgical History: Past Surgical History:  Procedure Laterality Date  . APPENDECTOMY    . BACK SURGERY    . COLONOSCOPY N/A 08/15/2014   Procedure: COLONOSCOPY;  Surgeon: [name], MD;  Location[facility] ENDOSCOPY;  Service: Endoscopy;  Laterality: N/A;  . ESOPHAGOGASTRODUODENOSCOPY N/A 08/15/2014   Procedure: ESOPHAGOGASTRODUODENOSCOPY (EGD);  Surgeon: [name], MD;  Location[facility] ENDOSCOPY;  Service: Endoscopy;  Laterality: N/A;  . TONSILLECTOMY      Home Medications:  Allergies as of 01/31/2019  Reactions   Ciprofloxacin Other (See Comments)   Erythromycin Swelling   Keflex [cephalexin] Swelling   Penicillins Swelling   Has patient had a PCN reaction causing immediate rash, facial/tongue/throat swelling, SOB or lightheadedness with hypotension: Yes Has patient had a PCN reaction causing severe rash involving mucus membranes or skin necrosis: No Has patient had a PCN reaction that required hospitalization: Unknown Has patient had a PCN reaction occurring within the last 10 years: No If all of the above answers are "NO", then may proceed with Cephalosporin use.      Medication List       Accurate as of January 31, 2019  4:02 PM. If you have any questions, ask your nurse or doctor.        B-12 5000 MCG Caps Take 5,000 mg by mouth daily. What changed: how much to take   buPROPion 150 MG 24 hr tablet Commonly known as: WELLBUTRIN XL Take 1 tablet (150 mg total) by mouth daily.    clotrimazole-betamethasone cream Commonly known as: LOTRISONE Apply 1 application topically 2 (two) times daily.   CO Q 10 PO Take 1 Dose by mouth daily.   Eliquis 2.5 MG Tabs tablet Generic drug: apixaban Take 2.5 mg by mouth 2 (two) times daily.   esomeprazole 40 MG capsule Commonly known as: NEXIUM Take 40 mg by mouth daily.   feeding supplement (ENSURE ENLIVE) Liqd Take 237 mLs by mouth 2 (two) times daily between meals.   finasteride 5 MG tablet Commonly known as: PROSCAR Take 5 mg by mouth daily.   FLUoxetine 40 MG capsule Commonly known as: PROzac Take 1 capsule (40 mg total) by mouth daily. To be combined with 20 mg   FLUoxetine 20 MG capsule Commonly known as: PROzac Take 1 capsule (20 mg total) by mouth daily. To be combined with 40 mg   Multi-Vitamin tablet Take by mouth.   nystatin powder Commonly known as: MYCOSTATIN/NYSTOP   nystatin-triamcinolone cream Commonly known as: MYCOLOG II APPLY TO AFFECTED AREAS TOPICALLY TWICE DAILY   rOPINIRole 0.5 MG tablet Commonly known as: REQUIP Take 1 tablet by mouth at bedtime.   simvastatin 40 MG tablet Commonly known as: ZOCOR Take 40 mg by mouth at bedtime.   tamsulosin 0.4 MG Caps capsule Commonly known as: FLOMAX Take 0.4 mg by mouth daily.       Allergies:  Allergies  Allergen Reactions  . Ciprofloxacin Other (See Comments)  . Erythromycin Swelling  . Keflex [Cephalexin] Swelling  . Penicillins Swelling    Has patient had a PCN reaction causing immediate rash, facial/tongue/throat swelling, SOB or lightheadedness with hypotension: Yes Has patient had a PCN reaction causing severe rash involving mucus membranes or skin necrosis: No Has patient had a PCN reaction that required hospitalization: Unknown Has patient had a PCN reaction occurring within the last 10 years: No If all of the above answers are "NO", then may proceed with Cephalosporin use.     Family History: Family History  Problem  Relation Age of Onset  . Stroke Mother   . COPD Father     Social History:  reports that he quit smoking about 60 years ago. He has never used smokeless tobacco. He reports current alcohol use of about 1.0 standard drinks of alcohol per week. He reports that he does not use drugs.  ROS: UROLOGY Frequent Urination?: Yes Hard to postpone urination?: No Burning/pain with urination?: No Get up at night to urinate?: Yes Leakage of urine?: No Urine stream starts  and stops?: No Trouble starting stream?: No Do you have to strain to urinate?: No Blood in urine?: No Urinary tract infection?: No Sexually transmitted disease?: No Injury to kidneys or bladder?: No Painful intercourse?: No Weak stream?: No Erection problems?: No Penile pain?: No  Gastrointestinal Nausea?: No Vomiting?: No Indigestion/heartburn?: No Diarrhea?: No Constipation?: No  Constitutional Fever: No Night sweats?: No Weight loss?: No Fatigue?: No  Skin Skin rash/lesions?: No Itching?: No  Eyes Blurred vision?: No Double vision?: No  Ears/Nose/Throat Sore throat?: No Sinus problems?: No  Hematologic/Lymphatic Swollen glands?: No Easy bruising?: No  Cardiovascular Leg swelling?: No Chest pain?: No  Respiratory Cough?: No Shortness of breath?: No  Endocrine Excessive thirst?: No  Musculoskeletal Back pain?: No Joint pain?: No  Neurological Headaches?: No Dizziness?: No  Psychologic Depression?: No Anxiety?: No  Physical Exam: BP 129/79   Pulse 80   Ht 6\' 4"  (1.93 m)   Wt 200 lb (90.7 kg)   BMI 24.34 kg/m   Constitutional:  Alert and oriented, No acute distress.  Unsteady on feet today, in wheelchair. HEENT: [place] AT, moist mucus membranes.  Trachea midline, no masses. Cardiovascular: No clubbing, cyanosis, or edema. Respiratory: Normal respiratory effort, no increased work of breathing. Skin: No rashes, bruises or suspicious lesions. Neurologic: Grossly intact, no focal  deficits, moving all 4 extremities. Psychiatric: Normal mood and affect.  Laboratory Data: Lab Results  Component Value Date   WBC 11.2 (H) 03/02/2018   HGB 15.6 03/02/2018   HCT 47.7 03/02/2018   MCV 91.7 03/02/2018   PLT 237 03/02/2018    Lab Results  Component Value Date   CREATININE 1.00 04/15/2018   PSA as above  Pertinent Imaging: Results for orders placed or performed in visit on 01/31/19  Bladder Scan (Post Void Residual) in office  Result Value Ref Range   Scan Result 23     Assessment & Plan:    1. Benign prostatic hyperplasia with urinary frequency Symptoms stable on finasteride and Flomax during the daytime  Not interested in surgical intervention   Adequate bladder emptying - Bladder Scan (Post Void Residual) in office  2. Nocturia Significant worsening nighttime symptoms  Explained that this likely multifactorial.  Given his history of snoring, I do think he ought to consider sleep study.  He is seeing Dr. Gustavus Bryant next week and will discuss this with him further.  If he does have undiagnosed untreated sleep apnea, his symptoms will likely improve with initiation of CPAP at nighttime.  If he is not diagnosed with sleep apnea her symptoms fail to improve, and recommend that he follow-up with [place] possibly with a nighttime voiding diary and consideration of alternative interventions/therapies including possibly DDAVP.  All questions answered.   Return in about 1 year (around 01/31/2020) for PSA for IPSS/ PVR.  Hollice Espy, MD  [facility] [address], [place], [place] [address] [phone]

## 2019-03-06 ENCOUNTER — Other Ambulatory Visit: Payer: Self-pay | Admitting: Psychiatry

## 2019-03-06 DIAGNOSIS — F330 Major depressive disorder, recurrent, mild: Secondary | ICD-10-CM

## 2019-03-06 MED ORDER — BUPROPION HCL ER (XL) 150 MG PO TB24: 150.0000 mg | ORAL_TABLET | Freq: Every day | ORAL | 1 refills | Status: DC

## 2019-03-06 NOTE — Telephone Encounter (Signed)
Sent Bupropion XL 150 mg daily.

## 2019-03-22 ENCOUNTER — Ambulatory Visit (INDEPENDENT_AMBULATORY_CARE_PROVIDER_SITE_OTHER): Payer: Medicare Other | Admitting: Psychiatry

## 2019-03-22 ENCOUNTER — Encounter: Payer: Self-pay | Admitting: Psychiatry

## 2019-03-22 ENCOUNTER — Other Ambulatory Visit: Payer: Self-pay

## 2019-03-22 DIAGNOSIS — G4701 Insomnia due to medical condition: Secondary | ICD-10-CM | POA: Diagnosis not present

## 2019-03-22 DIAGNOSIS — F3342 Major depressive disorder, recurrent, in full remission: Secondary | ICD-10-CM | POA: Diagnosis not present

## 2019-03-22 MED ORDER — FLUOXETINE HCL 20 MG PO CAPS: 20.0000 mg | ORAL_CAPSULE | Freq: Every day | ORAL | 1 refills | Status: DC

## 2019-03-22 MED ORDER — FLUOXETINE HCL 40 MG PO CAPS: 40.0000 mg | ORAL_CAPSULE | Freq: Every day | ORAL | 1 refills | Status: DC

## 2019-03-22 NOTE — Progress Notes (Signed)
Virtual Visit via Video Note  I connected with Mark Foerster. on 03/22/19 at 10:30 AM EST by a video enabled telemedicine application and verified that I am speaking with the correct person using two identifiers.   I discussed the limitations of evaluation and management by telemedicine and the availability of in person appointments. The patient expressed understanding and agreed to proceed.     I discussed the assessment and treatment plan with the patient. The patient was provided an opportunity to ask questions and all were answered. The patient agreed with the plan and demonstrated an understanding of the instructions.   The patient was advised to call back or seek an in-person evaluation if the symptoms worsen or if the condition fails to improve as anticipated.   [place] MD OP Progress Note  03/22/2019 12:05 PM Mark Foerster.  MRN:  [id number]  Chief Complaint:  Chief Complaint    Follow-up     HPI: Geovonni is a 78 year old Caucasian male, retired, lives in [place], has a history of depression, pneumothorax, history of syncopal episode, BPH, history of basal cell carcinoma, hyperlipidemia, vitamin B12 deficiency, restless leg syndrome, history of pulmonary embolism was evaluated by telemedicine today.  Collateral information was obtained from [place].  Patient today reports he is currently sad due to his recent situational stressors.  He reports he had to put down his dog who was with him for 14 years.  Patient however reports he is coping with it okay.  Patient otherwise denies any significant mood symptoms.  He reports sleep continues to be restless and he did have a sleep study done recently and is waiting results.  Patient denies any suicidality, homicidality or perceptual disturbances.  Patient's wife provided collateral information ,reported he is doing well so far on the current medication regimen.   Visit Diagnosis:    ICD-10-CM   1. MDD (major depressive  disorder), recurrent, in full remission ([place])  F33.42 FLUoxetine (PROZAC) 40 MG capsule    FLUoxetine (PROZAC) 20 MG capsule  2. Insomnia due to medical condition  G47.01     Past Psychiatric History: Reviewed past psychiatric history from my progress note on 04/20/2018.  Past trials of Prozac, Wellbutrin  Past Medical History:  Past Medical History:  Diagnosis Date  . Anxiety   . Atrial fibrillation ([place])   . Back pain, chronic   . Colon polyp   . Cyst of left kidney   . GERD (gastroesophageal reflux disease)   . Heart murmur   . Hyperlipemia   . Mitral valve prolapse   . Neuromuscular disorder ([place])   . Neuropathy   . Osteoarthritis   . Pulmonary emboli ([place])   . Spinal stenosis   . Thyroid nodule   . Vertigo     Past Surgical History:  Procedure Laterality Date  . APPENDECTOMY    . BACK SURGERY    . COLONOSCOPY N/A 08/15/2014   Procedure: COLONOSCOPY;  Surgeon: [name], MD;  Location[facility] ENDOSCOPY;  Service: Endoscopy;  Laterality: N/A;  . ESOPHAGOGASTRODUODENOSCOPY N/A 08/15/2014   Procedure: ESOPHAGOGASTRODUODENOSCOPY (EGD);  Surgeon: [name], MD;  Location: [facility] ENDOSCOPY;  Service: Endoscopy;  Laterality: N/A;  . TONSILLECTOMY      Family Psychiatric History: Reviewed family psychiatric history from my progress note on 04/20/2018.  Family History:  Family History  Problem Relation Age of Onset  . Stroke Mother   . COPD Father     Social History: Reviewed social history from my  progress note on 04/20/2018. Social History   Socioeconomic History  . Marital status: Married    Spouse name: gail  . Number of children: 2  . Years of education: Not on file  . Highest education level: Master's degree (e.g., MA, MS, MEng, MEd, MSW, MBA)  Occupational History  . Not on file  Tobacco Use  . Smoking status: Former Smoker    Quit date: 04/20/1958    Years since quitting: 60.9  . Smokeless tobacco: Never Used  Substance and Sexual Activity  .  Alcohol use: Yes    Alcohol/week: 1.0 standard drinks    Types: 1 Glasses of wine per week  . Drug use: Never  . Sexual activity: Not Currently  Other Topics Concern  . Not on file  Social History Narrative  . Not on file   Social Determinants of Health   Financial Resource Strain: Low Risk   . Difficulty of Paying Living Expenses: Not hard at all  Food Insecurity: No Food Insecurity  . Worried About Charity fundraiser in the Last Year: Never true  . Ran Out of Food in the Last Year: Never true  Transportation Needs: No Transportation Needs  . Lack of Transportation (Medical): No  . Lack of Transportation (Non-Medical): No  Physical Activity: Insufficiently Active  . Days of Exercise per Week: 2 days  . Minutes of Exercise per Session: 60 min  Stress: No Stress Concern Present  . Feeling of Stress : Not at all  Social Connections: Unknown  . Frequency of Communication with Friends and Family: Not on file  . Frequency of Social Gatherings with Friends and Family: Not on file  . Attends Religious Services: More than 4 times per year  . Active Member of Clubs or Organizations: Yes  . Attends Archivist Meetings: More than 4 times per year  . Marital Status: Married    Allergies:  Allergies  Allergen Reactions  . Ciprofloxacin Other (See Comments)  . Erythromycin Swelling  . Keflex [Cephalexin] Swelling  . Penicillins Swelling    Has patient had a PCN reaction causing immediate rash, facial/tongue/throat swelling, SOB or lightheadedness with hypotension: Yes Has patient had a PCN reaction causing severe rash involving mucus membranes or skin necrosis: No Has patient had a PCN reaction that required hospitalization: Unknown Has patient had a PCN reaction occurring within the last 10 years: No If all of the above answers are "NO", then may proceed with Cephalosporin use.     Metabolic Disorder Labs: No results found for: HGBA1C, MPG No results found for:  PROLACTIN No results found for: CHOL, TRIG, HDL, CHOLHDL, VLDL, LDLCALC Lab Results  Component Value Date   TSH 0.866 02/21/2018    Therapeutic Level Labs: No results found for: LITHIUM No results found for: VALPROATE No components found for:  CBMZ  Current Medications: Current Outpatient Medications  Medication Sig Dispense Refill  . diclofenac Sodium (VOLTAREN) 1 % GEL Apply topically.    Marland Kitchen apixaban (ELIQUIS) 2.5 MG TABS tablet Take 2.5 mg by mouth 2 (two) times daily.    Marland Kitchen buPROPion (WELLBUTRIN XL) 150 MG 24 hr tablet Take 1 tablet (150 mg total) by mouth daily. 90 tablet 1  . clotrimazole-betamethasone (LOTRISONE) cream Apply 1 application topically 2 (two) times daily.     . Coenzyme Q10 (CO Q 10 PO) Take 1 Dose by mouth daily.     . Cyanocobalamin (B-12) 5000 MCG CAPS Take 5,000 mg by mouth daily. (Patient taking differently:  Take 5,000 mcg by mouth daily. ) 30 capsule 0  . esomeprazole (NEXIUM) 40 MG capsule Take 40 mg by mouth daily.    . feeding supplement, ENSURE ENLIVE, (ENSURE ENLIVE) LIQD Take 237 mLs by mouth 2 (two) times daily between meals. 237 mL 12  . finasteride (PROSCAR) 5 MG tablet Take 5 mg by mouth daily.     Marland Kitchen FLUoxetine (PROZAC) 20 MG capsule Take 1 capsule (20 mg total) by mouth daily. To be combined with 40 mg 90 capsule 1  . FLUoxetine (PROZAC) 40 MG capsule Take 1 capsule (40 mg total) by mouth daily. To be combined with 20 mg 90 capsule 1  . Multiple Vitamin (MULTI-VITAMIN) tablet Take by mouth.    . nystatin (MYCOSTATIN/NYSTOP) powder     . nystatin-triamcinolone (MYCOLOG II) cream APPLY TO AFFECTED AREAS TOPICALLY TWICE DAILY 30 g 3  . rOPINIRole (REQUIP) 0.5 MG tablet Take 1 tablet by mouth at bedtime.    . simvastatin (ZOCOR) 40 MG tablet Take 40 mg by mouth at bedtime.     . tamsulosin (FLOMAX) 0.4 MG CAPS capsule Take 0.4 mg by mouth daily.      Current Facility-Administered Medications  Medication Dose Route Frequency Provider Last Rate Last  Admin  . nystatin (MYCOSTATIN/NYSTOP) topical powder   Topical BID Hollice Espy, MD         Musculoskeletal: Strength & Muscle Tone: UTA Gait & Station: Observed as seated Patient leans: N/A  Psychiatric Specialty Exam: Review of Systems  Psychiatric/Behavioral: Positive for sleep disturbance.  All other systems reviewed and are negative.   There were no vitals taken for this visit.There is no height or weight on file to calculate BMI.  General Appearance: Casual  Eye Contact:  Fair  Speech:  Clear and Coherent  Volume:  Normal  Mood:  Euthymic  Affect:  Appropriate  Thought Process:  Goal Directed and Descriptions of Associations: Intact  Orientation:  Full (Time, Place, and Person)  Thought Content: Logical   Suicidal Thoughts:  No  Homicidal Thoughts:  No  Memory:  Immediate;   Fair Recent;   Fair Remote;   Fair  Judgement:  Fair  Insight:  Fair  Psychomotor Activity:  Normal  Concentration:  Concentration: Fair and Attention Span: Fair  Recall:  [employer] of Knowledge: Fair  Language: Fair  Akathisia:  No  Handed:  Right  AIMS (if indicated): denies tremors, rigidity  Assets:  Communication Skills Desire for Improvement Housing Social Support  ADL's:  Intact  Cognition: WNL  Sleep:  restless   Screenings:   Assessment and Plan: Morgon is a 78 year old Caucasian male, lives in [place], has a history of depression, BPH, history of falls, syncopal episodes, RLS, basal cell carcinoma, hyperlipidemia was evaluated by telemedicine today.  Patient is biologically predisposed given his multiple health problems.  He also has psychosocial stressors of physical limitations.  Patient is currently doing well on the current medication regimen.  Plan as noted below.  Plan MDD in full remission Wellbutrin extended release 150 mg p.o. daily in the morning Prozac 60 mg p.o. daily Continue CBT as needed  Insomnia-restless Patient had recent sleep study done.   Awaiting results. Patient with BPH-frequent urination also affects his sleep.  Follow-up in clinic in 4 to 5 months or sooner if needed.  April 30 at 10 AM  I have spent atleast 15 minutes non face to face with patient today. More than 50 % of the time was spent for  psychoeducation and supportive psychotherapy and care coordination. This note was generated in part or whole with voice recognition software. Voice recognition is usually quite accurate but there are transcription errors that can and very often do occur. I apologize for any typographical errors that were not detected and corrected.       Ursula Alert, MD 03/22/2019, 12:05 PM

## 2019-07-04 IMAGING — DX DG CHEST 1V PORT
2 series · 2 of 2 positions shown · non-contrast
Comparison: 10/23/2017

CLINICAL DATA: Nausea, vomiting and depression after being treated
for urinary tract infection with Cipro.

EXAM:
PORTABLE CHEST 1 VIEW

[chest ap (1 of 2)]
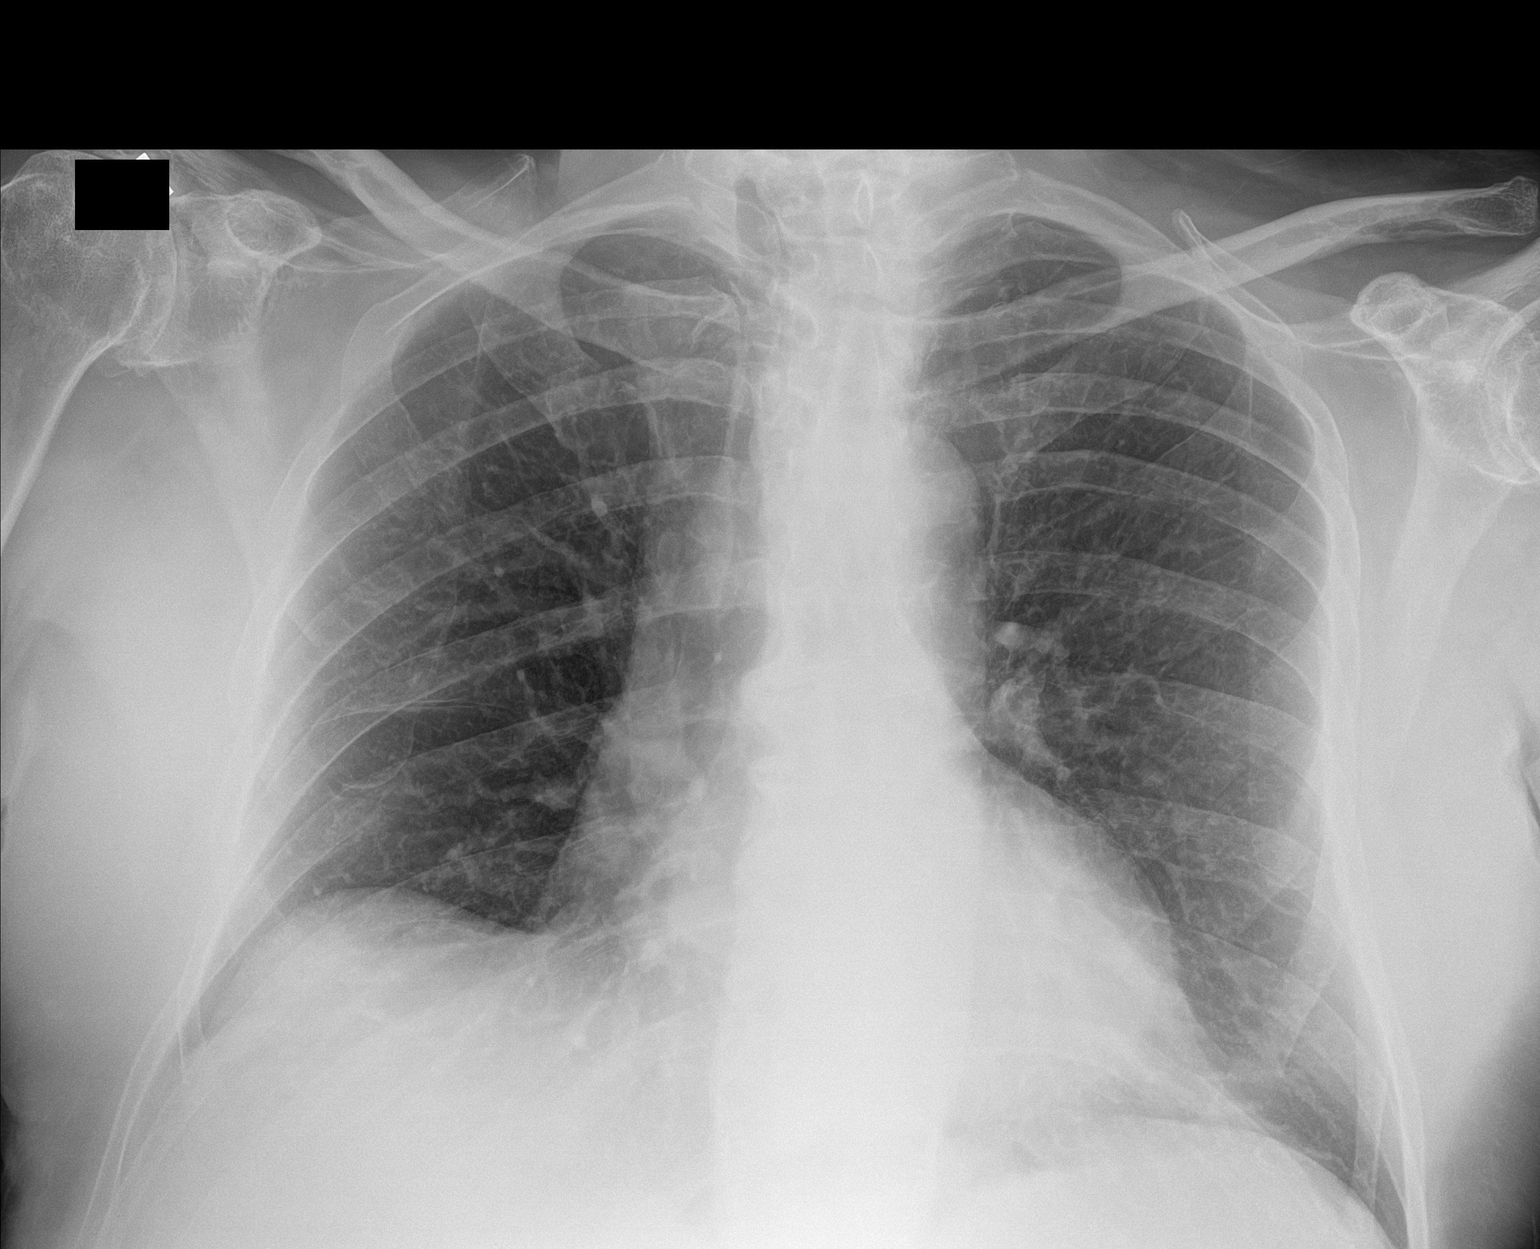

[chest ap (2 of 2)]
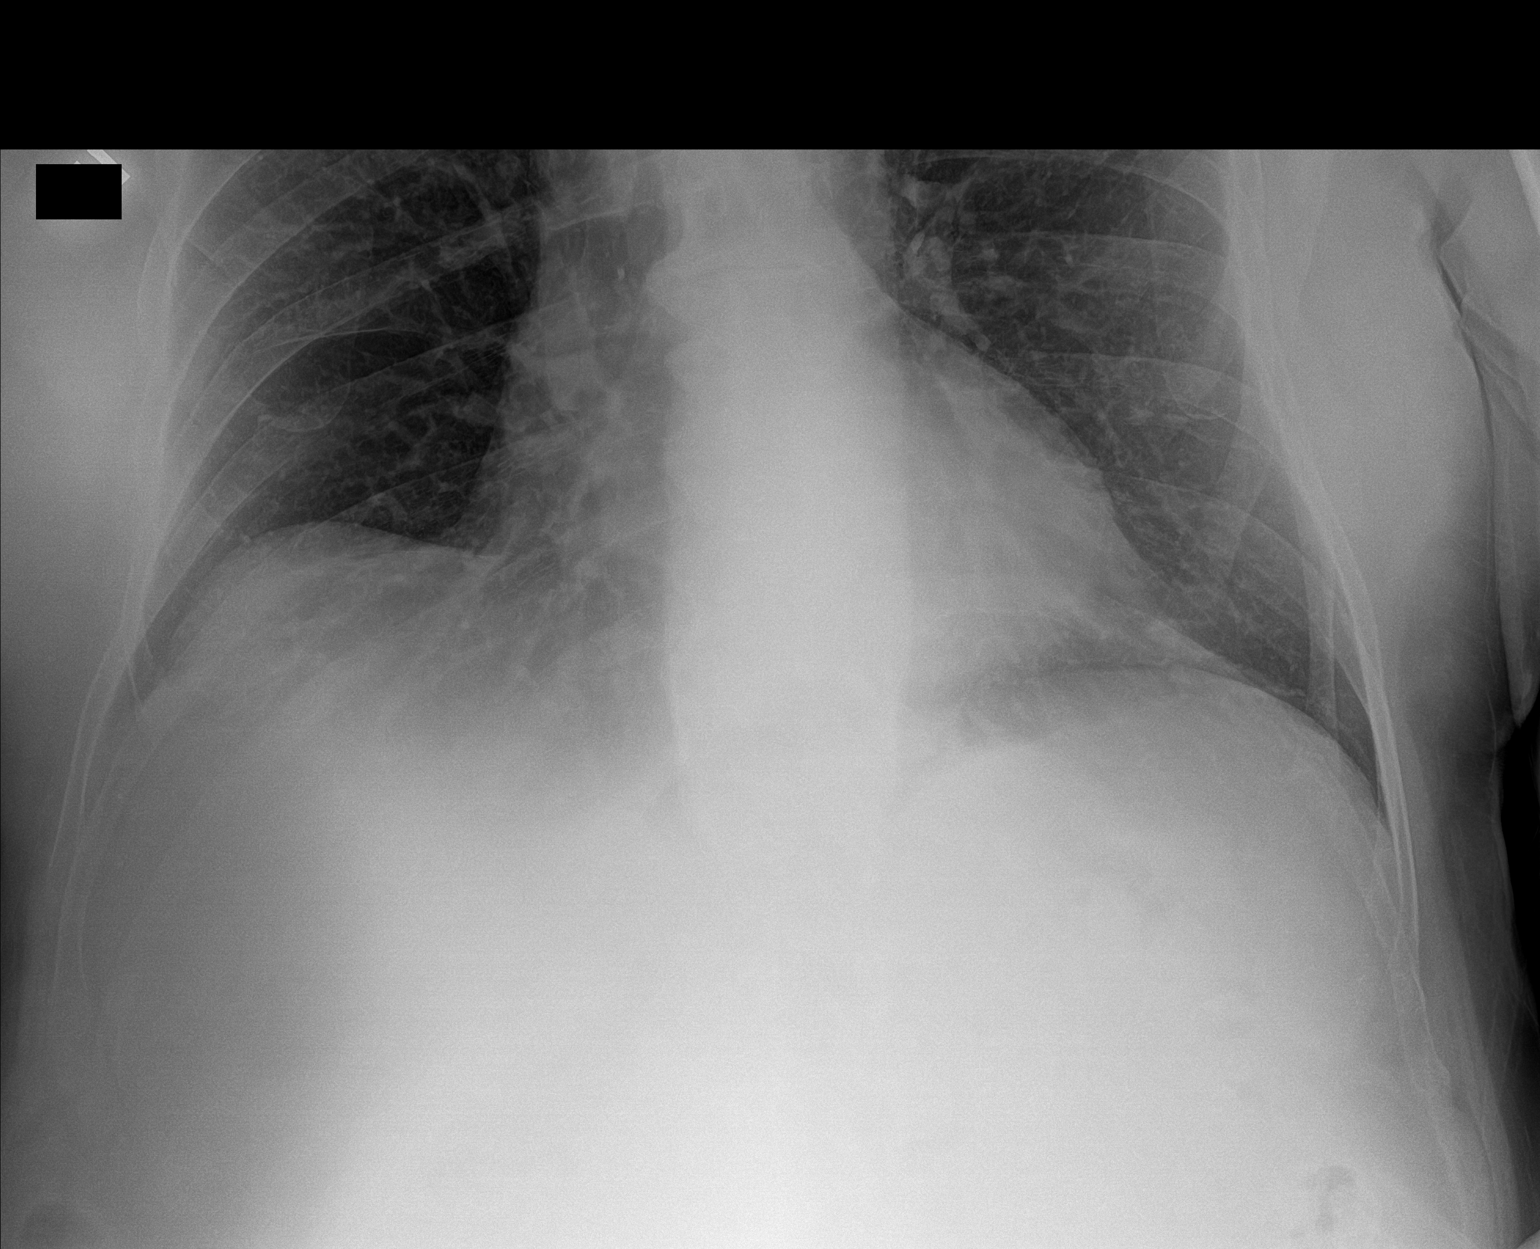

[2 of 2 positions shown; findings below may reference images not displayed]

FINDINGS: The heart size and mediastinal contours are within normal limits.
Both lungs are clear. Stable mild elevation or eventration of the
right hemidiaphragm. Osteoarthritis of the included glenohumeral and
right AC joints.
IMPRESSION: No active disease.

## 2019-07-04 IMAGING — MR MRI THORACIC SPINE WITHOUT CONTRAST
4 series · 24 of 48 positions shown · non-contrast
Comparison: None.

CLINICAL DATA: Nausea, vomiting and weakness. Recent treated
urinary tract infection. History of atrial fibrillation,
hyperlipidemia, spinal stenosis, spine surgery.

EXAM:
MRI CERVICAL, THORACIC AND LUMBAR SPINE WITHOUT CONTRAST
MRI HEAD WITHOUT CONTRAST
TECHNIQUE: Multiplanar and multiecho pulse sequences of the cervical spine, to
include the craniocervical junction and cervicothoracic junction,
and thoracic and lumbar spine, were obtained without intravenous
contrast. Multiplanar, multiecho pulse sequences of the brain and
surrounding structures were obtained without intravenous contrast.

[Series 19: T2 · sagittal · 3.0mm · 0.62mm/px · 7 of 15 slices shown (1 of 2)]
[im 1/15]
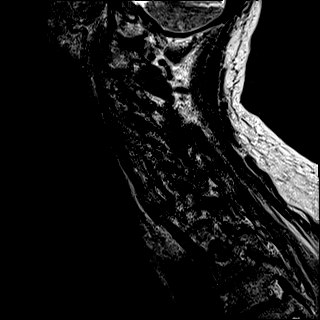
[im 3/15]
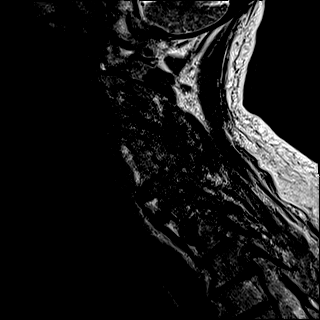
[im 5/15]
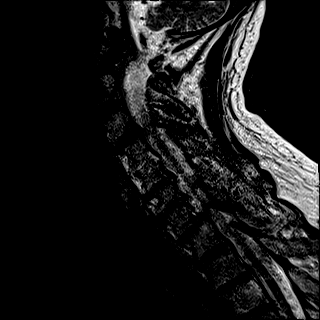
[im 8/15]
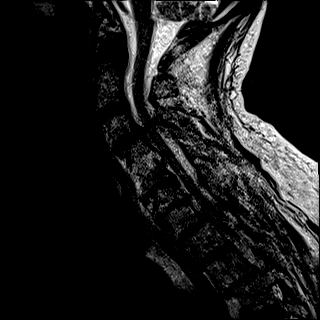
[im 10/15]
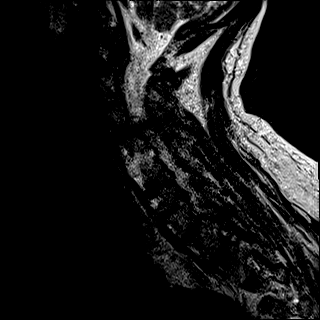
[im 12/15]
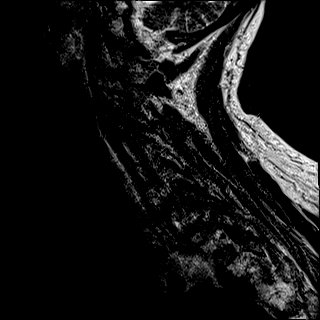
[im 15/15]
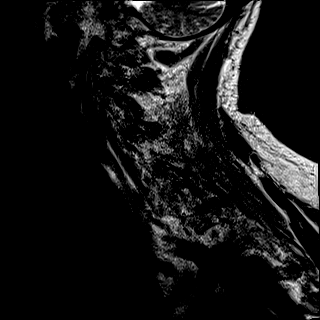

[Series 22: T2 · axial · 3.0mm · 0.70mm/px · z∈[-211,-126]mm · 9 of 31 slices shown (2 of 2)]
[im 1/31]
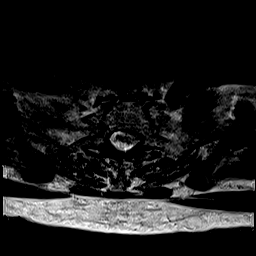
[im 5/31]
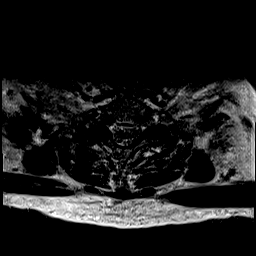
[im 9/31]
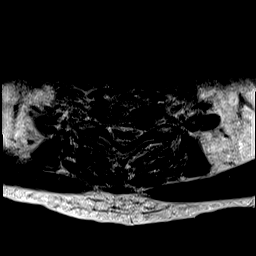
[im 13/31]
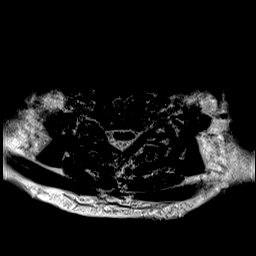
[im 16/31]
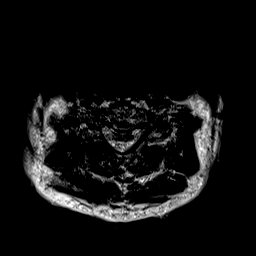
[im 18/31]
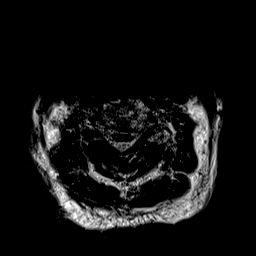
[im 22/31]
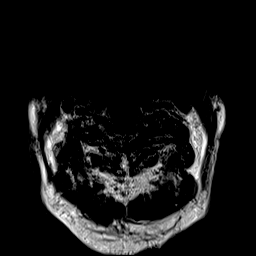
[im 26/31]
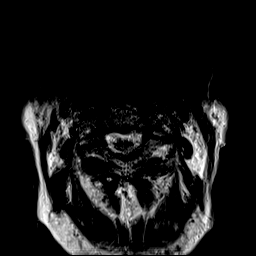
[im 31/31]
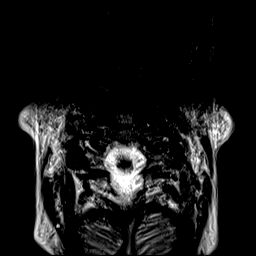

[Series 26: STIR · sagittal · 3.0mm · 0.66mm/px · 5 of 17 slices shown]
[im 1/17]
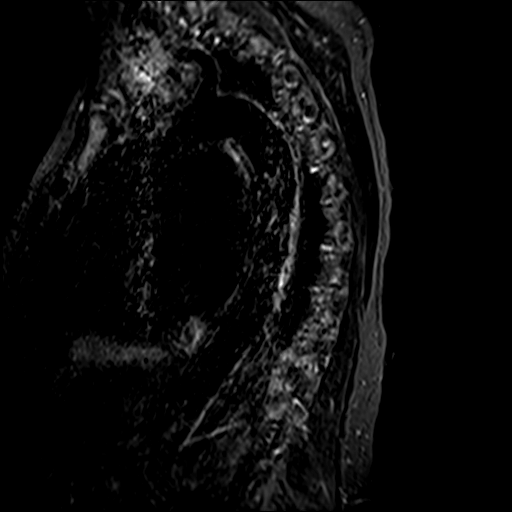
[im 3/17]
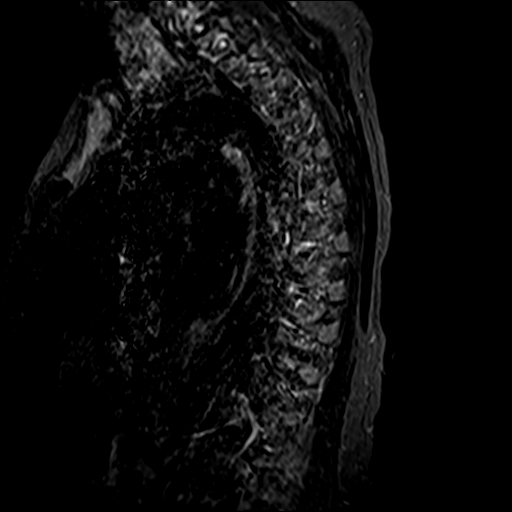
[im 5/17]
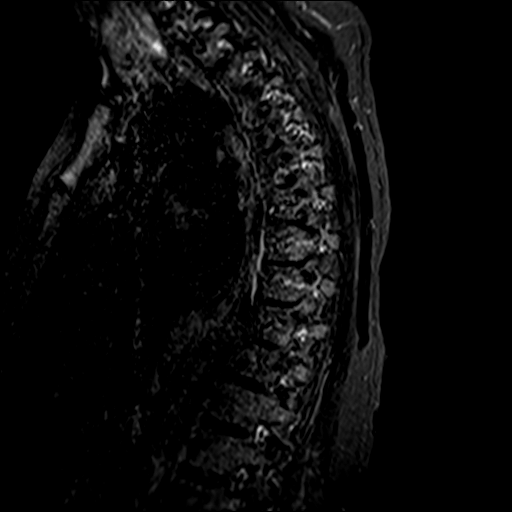
[im 10/17]
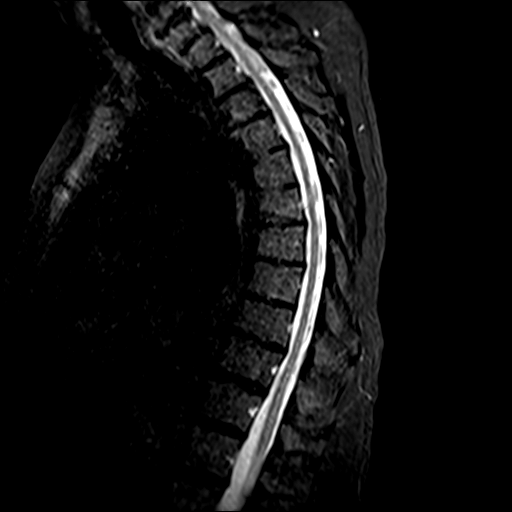
[im 14/17]
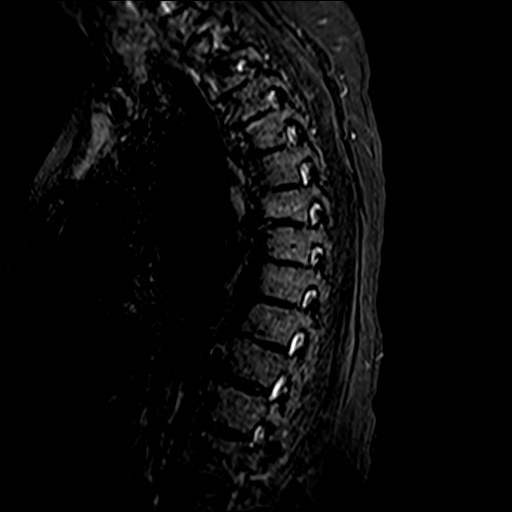

[Series 28: GRE · axial · 4.0mm · 0.37mm/px · z∈[-367,-225]mm · 3 of 39 slices shown]
[im 7/39]
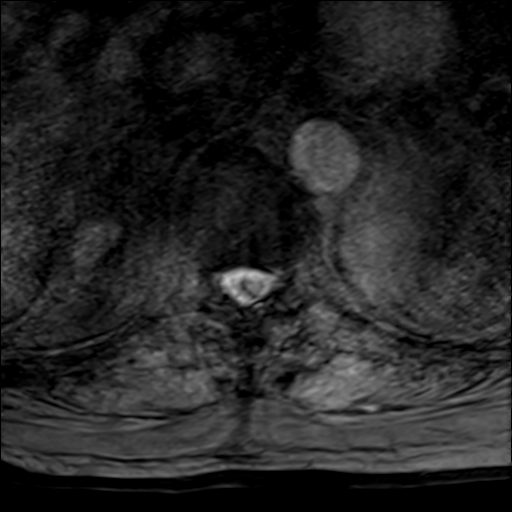
[im 21/39]
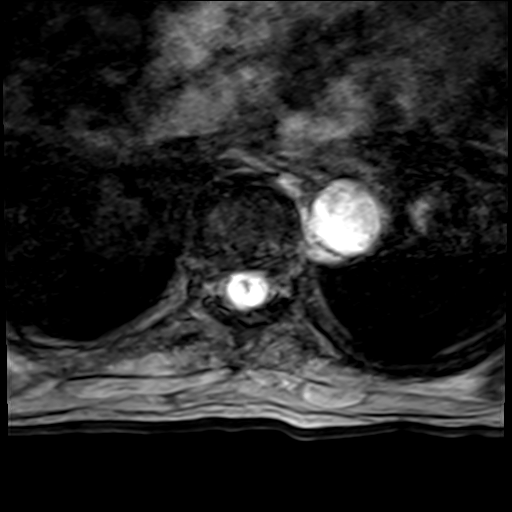
[im 34/39]
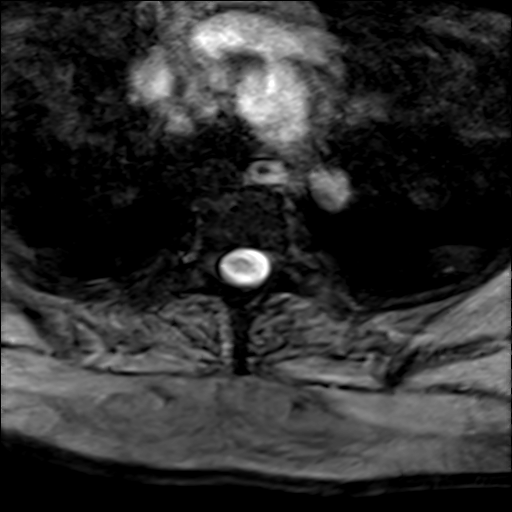

[24 of 48 positions shown; findings below may reference images not displayed]

MRI head November 21, 2017 and MRI thoracic spine November 12, 2016
and MRI lumbar spine April 21, 2016.
FINDINGS: MRI HEAD FINDINGS

INTRACRANIAL CONTENTS: No reduced diffusion to suggest acute
ischemia or typical infection. No susceptibility artifact to suggest
hemorrhage. The ventricles and sulci are normal for patient's age.
Patchy to confluent supratentorial white matter FLAIR T2
hyperintensities. No suspicious parenchymal signal, masses, mass
effect. No abnormal extra-axial fluid collections. No extra-axial
masses.

VASCULAR: Normal major intracranial vascular flow voids present at
skull base.

SKULL AND UPPER CERVICAL SPINE: No abnormal sellar expansion. No
suspicious calvarial bone marrow signal. Craniocervical junction
maintained.

SINUSES/ORBITS: The mastoid air-cells and included paranasal sinuses
are well-aerated.The included ocular globes and orbital contents are
non-suspicious.

OTHER: None.

MRI CERVICAL SPINE FINDINGS-moderately motion degraded sagittal
sequences, axial sequences are moderate to severely motion degraded.

ALIGNMENT: Straightened cervical lordosis. Grade 1 C3-4
anterolisthesis and C7-T1 anterolisthesis.

VERTEBRAE/DISCS: Vertebral bodies are intact. Moderate C3-4 disc
height loss with RIGHT T1, bright T2 signal within the disc, no STIR
signal abnormality contiguous with LEFT facet, most compatible
mineralization. Severe C5-6, C6-7 and moderate C7-T1 disc height
loss, and disc desiccation compatible with degenerative discs.
Moderate C5-6 and C6-7 subacute discogenic endplate changes.

CORD: Linear bright STIR signal at C3-4 not localized on remaining
sequences, potentially due to motion.

POSTERIOR FOSSA, VERTEBRAL ARTERIES, PARASPINAL TISSUES: No MR
findings of ligamentous injury. Vertebral artery flow voids present.
Included posterior fossa and paraspinal soft tissues are normal.

DISC LEVELS:

Limited assessment due to motion.

C2-3: Annular bulging, uncovertebral hypertrophy and severe RIGHT,
moderate LEFT facet arthropathy. No canal stenosis or neural
foraminal narrowing.

C3-4: Anterolisthesis. Small broad-based disc bulge, uncovertebral
hypertrophy in severe LEFT, mild RIGHT facet arthropathy. No canal
stenosis. Moderate RIGHT and severe suspected LEFT neural foraminal
narrowing.

C4-5: Small broad-based disc osteophyte and vertebral hypertrophy
and severe LEFT and moderate RIGHT facet arthropathy. No canal
stenosis. Moderate suspected LEFT neural foraminal narrowing.

C5-6: Small broad-based disc bulge, uncovertebral hypertrophy and
moderate LEFT, mild RIGHT facet arthropathy. No canal stenosis.
Moderate suspected bilateral neural foraminal narrowing.

C6-7: Small broad-based disc bulge, uncovertebral hypertrophy and
mild facet arthropathy. No canal stenosis. Severe suspected neural
narrowing.

C7-T1: Anterolisthesis. Annular bulging and severe RIGHT moderate
LEFT facet arthropathy. No canal. Mild moderate RIGHT neural
foraminal narrowing.

MRI THORACIC SPINE FINDINGS-moderately motion degraded examination.

ALIGNMENT: Maintenance of the thoracic kyphosis. No malalignment.

VERTEBRAE/DISCS: Vertebral bodies are intact. Advanced upper
thoracic disc height loss with bridging bone marrow signal
compatible with arthrodesis. Multilevel moderate to severe disc
height loss with disc desiccation compatible with degenerative
discs. T6 acute discogenic endplate changes versus Schmorl's node.
No suspicious bone marrow signal.

CORD: Limited assessment spinal cord due to motion, no syrinx or
definite myelomalacia.

PREVERTEBRAL AND PARASPINAL SOFT TISSUES:  Non suspicious.

DISC LEVELS:

Small T4-5 central disc protrusion. No canal stenosis. Moderate
RIGHT T1-2 neural foraminal narrowing.

MRI LUMBAR SPINE FINDINGS-moderately motion degraded axial
sequences.

SEGMENTATION: For the purposes of this report, the last well-formed
intervertebral disc is reported as L5-S1. Transitional anatomy with
partially sacralized L5 vertebral body, consistent with prior MRI.

ALIGNMENT: Maintained lumbar lordosis. Minimal stable grade 1 L4-5
anterolisthesis and grade 1 L5-S1 anterolisthesis.

VERTEBRAE:Vertebral bodies are intact. Similar moderate L2-3 disc
height loss, mild at L4-5. Diffuse disc desiccation multilevel mild
chronic discogenic endplate changes. L4 inferior endplate acute
Schmorl's node. Focal fat versus hemangioma RIGHT L5 pedicle. No
suspicious bone marrow signal.

CONUS MEDULLARIS AND CAUDA EQUINA: Conus medullaris terminates at L1
and demonstrates normal morphology and signal characteristics.
Limited assessment of cauda equina due to motion.

PARASPINAL AND OTHER SOFT TISSUES: Non suspicious. Severe paraspinal
muscle atrophy and below the level surgical intervention.

DISC LEVELS:

L1-2: No disc bulge, canal stenosis nor neural foraminal narrowing.
Mild facet arthropathy.

L2-3: Small broad-based disc bulge. Moderate facet arthropathy and
ligamentum flavum redundancy. No canal stenosis. Moderate RIGHT and
mild LEFT neural foraminal narrowing.

L3-4: Annular bulging. Severe facet arthropathy and ligamentum
flavum redundancy. Mild canal stenosis. No neural foraminal
narrowing.

L4-5: Anterolisthesis. Annular bulging. Moderate to severe facet
arthropathy with trace facet effusions. 9 mm RIGHT facet synovial
cyst within paraspinal soft tissues. Moderate canal stenosis. Mild
bilateral neural foraminal narrowing.

L5-S1: Posterior decompression. Anterolisthesis. Severe facet
arthropathy without canal stenosis. Mild-to-moderate bilateral
neural foraminal narrowing.
IMPRESSION: MRI head:

1. No acute intracranial process.
2. Stable moderate chronic small vessel ischemic changes.

MRI cervical spine:

1. Motion degraded examination. Short segment potential myelomalacia
at C3-4.
2. Grade 1 C3-4 and C7-T1 anterolisthesis. No acute osseous process.
3. No canal stenosis. Neural foraminal narrowing C3-4 through C7-T1:
Likely severe at C3-4 and C6-7. Given degree of motion, CT cervical
spine may be of added value.

MRI thoracic spine:

1. Motion degraded examination.  No fracture or malalignment.
2. Stable degenerative change of the thoracic spine. No canal.
Moderate RIGHT T1-2 neural foraminal narrowing.

MRI lumbar spine:

1. Motion degraded examination.
2. Stable grade 1 L4-5 and L5-S1 anterolisthesis. Status post L5
laminectomies.
3. Stable moderate canal stenosis L4-5, mild at L3-4.
4. Stable neural foraminal narrowing L2-3, L4-5 and L5-S1: Moderate
on the RIGHT at L2-3.

## 2019-07-21 ENCOUNTER — Telehealth (INDEPENDENT_AMBULATORY_CARE_PROVIDER_SITE_OTHER): Payer: Medicare Other | Admitting: Psychiatry

## 2019-07-21 ENCOUNTER — Encounter: Payer: Self-pay | Admitting: Psychiatry

## 2019-07-21 ENCOUNTER — Encounter: Payer: Self-pay | Admitting: Urology

## 2019-07-21 ENCOUNTER — Other Ambulatory Visit: Payer: Self-pay

## 2019-07-21 DIAGNOSIS — G4701 Insomnia due to medical condition: Secondary | ICD-10-CM

## 2019-07-21 DIAGNOSIS — F3342 Major depressive disorder, recurrent, in full remission: Secondary | ICD-10-CM

## 2019-07-21 MED ORDER — FLUOXETINE HCL 40 MG PO CAPS: 40.0000 mg | ORAL_CAPSULE | Freq: Every day | ORAL | 1 refills | Status: DC

## 2019-07-21 MED ORDER — FLUOXETINE HCL 20 MG PO CAPS: 20.0000 mg | ORAL_CAPSULE | Freq: Every day | ORAL | 1 refills | Status: DC

## 2019-07-21 MED ORDER — BUPROPION HCL ER (XL) 150 MG PO TB24: 150.0000 mg | ORAL_TABLET | Freq: Every day | ORAL | 1 refills | Status: DC

## 2019-07-21 NOTE — Progress Notes (Signed)
Provider Location : [facility] Patient Location : Home  Virtual Visit via Video Note  I connected with Mark [name]. on 07/21/19 at 10:00 AM EDT by a video enabled telemedicine application and verified that I am speaking with the correct person using two identifiers.   I discussed the limitations of evaluation and management by telemedicine and the availability of in person appointments. The patient expressed understanding and agreed to proceed.     I discussed the assessment and treatment plan with the patient. The patient was provided an opportunity to ask questions and all were answered. The patient agreed with the plan and demonstrated an understanding of the instructions.   The patient was advised to call back or seek an in-person evaluation if the symptoms worsen or if the condition fails to improve as anticipated.   [facility] MD OP Progress Note  07/21/2019 12:26 PM Mark [name].  MRN:  [id number]  Chief Complaint:  Chief Complaint    Follow-up     HPI: Mark [name] is an 79 year old Caucasian male, retired, lives in [place], has a history of depression, pneumothorax, history of syncopal episodes, BPH, history of basal cell carcinoma, hyperlipidemia, vitamin B12 deficiency, restless leg syndrome, recent diagnosis of obstructive sleep apnea, history of PE was evaluated by telemedicine today.  Collateral information was obtained from [place].  Patient today reports he is currently doing well on the current medication regimen.  He denies any significant depression or anxiety symptoms.  Patient appeared to be alert, oriented to person place time and situation and was able to answer questions appropriately.  He reports sleep overall as okay.  He does have to get up in the middle of the night to urinate.  That does interrupt his sleep.  He reports his sleep study result returned as having obstructive sleep apnea.  Patient however reports he did not tolerate the CPAP machine well.  He hence does  not use it.  Patient denies any suicidality, homicidality or perceptual disturbances.  Per collateral information from wife is compliant on medications.  She believes he is doing well.       Visit Diagnosis:    ICD-10-CM   1. MDD (major depressive disorder), recurrent, in full remission ([place])  F33.42 buPROPion (WELLBUTRIN XL) 150 MG 24 hr tablet  2. Insomnia due to medical condition  G47.01 FLUoxetine (PROZAC) 20 MG capsule    FLUoxetine (PROZAC) 40 MG capsule    Past Psychiatric History: I have reviewed past psychiatric history from my progress note on 04/20/2018.  Past trials of Prozac, Wellbutrin.  Past Medical History:  Past Medical History:  Diagnosis Date  . Anxiety   . Atrial fibrillation ([place])   . Back pain, chronic   . Colon polyp   . Cyst of left kidney   . GERD (gastroesophageal reflux disease)   . Heart murmur   . Hyperlipemia   . Mitral valve prolapse   . Neuromuscular disorder ([place])   . Neuropathy   . Osteoarthritis   . Pulmonary emboli ([place])   . Spinal stenosis   . Thyroid nodule   . Vertigo     Past Surgical History:  Procedure Laterality Date  . APPENDECTOMY    . BACK SURGERY    . COLONOSCOPY N/A 08/15/2014   Procedure: COLONOSCOPY;  Surgeon: [name], MD;  [facility] ENDOSCOPY;  Service: Endoscopy;  Laterality: N/A;  . ESOPHAGOGASTRODUODENOSCOPY N/A 08/15/2014   Procedure: ESOPHAGOGASTRODUODENOSCOPY (EGD);  Surgeon: [name], MD;  Location: [facility];  Service: Endoscopy;  Laterality: N/A;  . TONSILLECTOMY      Family Psychiatric History: I have reviewed family psychiatric history from my progress note on 04/20/2018.  Family History:  Family History  Problem Relation Age of Onset  . Stroke Mother   . COPD Father     Social History: Reviewed social history from my progress note on 04/20/2018. Social History   Socioeconomic History  . Marital status: Married    Spouse name: gail  . Number of children: 2  . Years of  education: Not on file  . Highest education level: Master's degree (e.g., MA, MS, MEng, MEd, MSW, MBA)  Occupational History  . Not on file  Tobacco Use  . Smoking status: Former Smoker    Quit date: 04/20/1958    Years since quitting: 61.2  . Smokeless tobacco: Never Used  Substance and Sexual Activity  . Alcohol use: Yes    Alcohol/week: 1.0 standard drinks    Types: 1 Glasses of wine per week  . Drug use: Never  . Sexual activity: Not Currently  Other Topics Concern  . Not on file  Social History Narrative  . Not on file   Social Determinants of Health   Financial Resource Strain:   . Difficulty of Paying Living Expenses:   Food Insecurity:   . Worried About Charity fundraiser in the Last Year:   . Arboriculturist in the Last Year:   Transportation Needs:   . Film/video editor (Medical):   Marland Kitchen Lack of Transportation (Non-Medical):   Physical Activity:   . Days of Exercise per Week:   . Minutes of Exercise per Session:   Stress:   . Feeling of Stress :   Social Connections:   . Frequency of Communication with Friends and Family:   . Frequency of Social Gatherings with Friends and Family:   . Attends Religious Services:   . Active Member of Clubs or Organizations:   . Attends Archivist Meetings:   Marland Kitchen Marital Status:     Allergies:  Allergies  Allergen Reactions  . Ciprofloxacin Other (See Comments)  . Erythromycin Swelling  . Keflex [Cephalexin] Swelling  . Penicillins Swelling    Has patient had a PCN reaction causing immediate rash, facial/tongue/throat swelling, SOB or lightheadedness with hypotension: Yes Has patient had a PCN reaction causing severe rash involving mucus membranes or skin necrosis: No Has patient had a PCN reaction that required hospitalization: Unknown Has patient had a PCN reaction occurring within the last 10 years: No If all of the above answers are "NO", then may proceed with Cephalosporin use.     Metabolic Disorder  Labs: No results found for: HGBA1C, MPG No results found for: PROLACTIN No results found for: CHOL, TRIG, HDL, CHOLHDL, VLDL, LDLCALC Lab Results  Component Value Date   TSH 0.866 02/21/2018    Therapeutic Level Labs: No results found for: LITHIUM No results found for: VALPROATE No components found for:  CBMZ  Current Medications: Current Outpatient Medications  Medication Sig Dispense Refill  . apixaban (ELIQUIS) 2.5 MG TABS tablet Take 2.5 mg by mouth 2 (two) times daily.    . B Complex Vitamins (VITAMIN B COMPLEX) TABS Take by mouth.    . budesonide-formoterol (SYMBICORT) 160-4.5 MCG/ACT inhaler Inhale into the lungs.    Marland Kitchen buPROPion (WELLBUTRIN XL) 150 MG 24 hr tablet Take 1 tablet (150 mg total) by mouth daily. 90 tablet 1  . clotrimazole-betamethasone (LOTRISONE) cream Apply 1  application topically 2 (two) times daily.     . Coenzyme Q10 (CO Q 10 PO) Take 1 Dose by mouth daily.     . Cyanocobalamin (B-12) 5000 MCG CAPS Take 5,000 mg by mouth daily. (Patient taking differently: Take 5,000 mcg by mouth daily. ) 30 capsule 0  . diclofenac Sodium (VOLTAREN) 1 % GEL Apply topically.    Marland Kitchen esomeprazole (NEXIUM) 40 MG capsule Take 40 mg by mouth daily.    . feeding supplement, ENSURE ENLIVE, (ENSURE ENLIVE) LIQD Take 237 mLs by mouth 2 (two) times daily between meals. 237 mL 12  . finasteride (PROSCAR) 5 MG tablet Take 5 mg by mouth daily.     Marland Kitchen FLUoxetine (PROZAC) 20 MG capsule Take 1 capsule (20 mg total) by mouth daily. To be combined with 40 mg 90 capsule 1  . FLUoxetine (PROZAC) 40 MG capsule Take 1 capsule (40 mg total) by mouth daily. To be combined with 20 mg 90 capsule 1  . Multiple Vitamin (MULTI-VITAMIN) tablet Take by mouth.    . nystatin (MYCOSTATIN/NYSTOP) powder     . nystatin-triamcinolone (MYCOLOG II) cream APPLY TO AFFECTED AREAS TOPICALLY TWICE DAILY 30 g 3  . simvastatin (ZOCOR) 40 MG tablet Take 40 mg by mouth at bedtime.     . tamsulosin (FLOMAX) 0.4 MG CAPS  capsule Take 0.4 mg by mouth daily.     Marland Kitchen rOPINIRole (REQUIP) 0.5 MG tablet Take 1 tablet by mouth at bedtime.     Current Facility-Administered Medications  Medication Dose Route Frequency Provider Last Rate Last Admin  . nystatin (MYCOSTATIN/NYSTOP) topical powder   Topical BID Hollice Espy, MD         Musculoskeletal: Strength & Muscle Tone: UTA Gait & Station: Observed as seated Patient leans: N/A  Psychiatric Specialty Exam: Review of Systems  Psychiatric/Behavioral: Positive for sleep disturbance. Negative for agitation, behavioral problems, confusion, decreased concentration, dysphoric mood, hallucinations, self-injury and suicidal ideas. The patient is not nervous/anxious and is not hyperactive.   All other systems reviewed and are negative.   There were no vitals taken for this visit.There is no height or weight on file to calculate BMI.  General Appearance: Casual  Eye Contact:  Fair  Speech:  Clear and Coherent  Volume:  Normal  Mood:  Euthymic  Affect:  Congruent  Thought Process:  Goal Directed and Descriptions of Associations: Intact  Orientation:  Full (Time, Place, and Person)  Thought Content: Logical   Suicidal Thoughts:  No  Homicidal Thoughts:  No  Memory:  Immediate;   Fair Recent;   Fair Remote;   Fair  Judgement:  Fair  Insight:  Fair  Psychomotor Activity:  Normal  Concentration:  Concentration: Fair and Attention Span: Fair  Recall:  [employer] of Knowledge: Fair  Language: Fair  Akathisia:  No  Handed:  Right  AIMS (if indicated): UTA  Assets:  Communication Skills Desire for Improvement Housing Social Support  ADL's:  Intact  Cognition: WNL  Sleep:  restless   Screenings:   Assessment and Plan: Iosefa is a 79 year old Caucasian male, lives in [place], has a history of depression, BPH, history of falls, syncopal episodes, RLS, basal cell carcinoma, hyperlipidemia was evaluated by telemedicine today.  Patient is biologically  predisposed given his multiple health problems.  Patient with psychosocial stressors with physical limitations.  Patient however is currently stable on current medication regimen.  Plan as noted below.  Plan MDD in full remission Wellbutrin extended release 150 mg p.o.  daily in the morning Prozac 60 mg p.o. daily Continue CBT as needed  Insomnia-restless Patient does have frequent urination due to BPH which does affect his sleep Patient was recently diagnosed with obstructive sleep apnea however he has been noncompliant with CPAP.  He reports he did not tolerate the CPAP device. Patient however declines any medication changes for sleep.  Follow-up in clinic in 6 months or sooner if needed.  Discussed with patient if he continues to remain stable he can be tapered off of his medications.  Collateral information was obtained from wife as summarized above.  I have spent atleast 20 minutes non face to face with patient today. More than 50 % of the time was spent for preparing to see the patient ( e.g., review of test, records ), ordering medications and test ,psychoeducation and supportive psychotherapy and care coordination,as well as documenting clinical information in electronic health record. This note was generated in part or whole with voice recognition software. Voice recognition is usually quite accurate but there are transcription errors that can and very often do occur. I apologize for any typographical errors that were not detected and corrected.       Ursula Alert, MD 07/21/2019, 12:26 PM

## 2019-10-11 ENCOUNTER — Other Ambulatory Visit: Payer: Self-pay | Admitting: Urology

## 2019-10-11 DIAGNOSIS — N401 Enlarged prostate with lower urinary tract symptoms: Secondary | ICD-10-CM

## 2019-10-25 ENCOUNTER — Other Ambulatory Visit: Payer: Self-pay

## 2019-10-25 ENCOUNTER — Ambulatory Visit
Admission: RE | Admit: 2019-10-25 | Discharge: 2019-10-25 | Disposition: A | Discharge status: Home / Self Care | Payer: Medicare Other | Source: Ambulatory Visit | Attending: Internal Medicine | Admitting: Internal Medicine

## 2019-10-25 ENCOUNTER — Other Ambulatory Visit: Payer: Self-pay | Admitting: Internal Medicine

## 2019-10-25 ENCOUNTER — Other Ambulatory Visit (HOSPITAL_COMMUNITY): Payer: Self-pay | Admitting: Internal Medicine

## 2019-10-25 DIAGNOSIS — R1031 Right lower quadrant pain: Secondary | ICD-10-CM | POA: Diagnosis present

## 2019-10-25 HISTORY — DX: Malignant (primary) neoplasm, unspecified: C80.1

## 2019-10-25 MED ORDER — IOHEXOL 300 MG/ML  SOLN
100.0000 mL | Freq: Once | INTRAMUSCULAR | Status: AC | PRN
  Administered 2019-10-25: 100 mL via INTRAVENOUS

## 2019-12-19 NOTE — Progress Notes (Signed)
12/20/2019 4:01 PM   Mark [name]. January 17, 1941 [id number]  Referring provider: Tracie Harrier, MD [address] [facility] [place],  [place] [address] Chief Complaint  Patient presents with  . Benign Prostatic Hypertrophy    HPI: Mark [name]. is a 79 y.o. male who returns for a 10 month follow up of BPH with urinary frequency and nocturia. Patient also wants to be evaluated for right lower quadrant abdominal pain, hematuria and abnormal urinalysis. Patient's wife is present for with him today.  Last time, his daytime urinary symptoms were well controlled and stable.  He was on Flomax and finasteride.  His primary complaint was nocturia.  He got up at times as frequently as every hour.  He believed that the volumes he voids were comparable to his daytime volumes.  He denied any lower extremity swelling.  He did snore.  He was never had a sleep study.  No recent infections dysuria or gross hematuria.  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  He reports he said no issues with his penis, does not desire circumcision, but does feel like his penis is shrinking.  Most recent PSA 0.7 on 01/2019  CT A/P w/ contrast on 10/25/2019 noted no acute intra-abdominal or pelvic pathology. No bowel obstruction. Small scattered sigmoid diverticula. Fatty liver.  UA at [facility] on 10/30/2019 showed a few bacteria. Culture Pseudomonas aeruginosa. Recent UA on 12/05/2019 showed large blood, >50 RBC and rare bacteria. Urine culture was negative.   Patient is on Eliquis. He reports no change in his urinary symptoms at the time of hamartia episode. He reports a mild red color in his urine.    PMH: Past Medical History:  Diagnosis Date  . Anxiety   . Atrial fibrillation ([place])   . Back pain, chronic   . Cancer ([place])    skin  . Colon polyp   . Cyst of left kidney   . GERD (gastroesophageal reflux  disease)   . Heart murmur   . Hyperlipemia   . Mitral valve prolapse   . Neuromuscular disorder ([place])   . Neuropathy   . Osteoarthritis   . Pulmonary emboli ([place])   . Spinal stenosis   . Thyroid nodule   . Vertigo     Surgical History: Past Surgical History:  Procedure Laterality Date  . APPENDECTOMY    . BACK SURGERY    . COLONOSCOPY N/A 08/15/2014   Procedure: COLONOSCOPY;  Surgeon: [name], MD;  Location: [facility] ENDOSCOPY;  Service: Endoscopy;  Laterality: N/A;  . ESOPHAGOGASTRODUODENOSCOPY N/A 08/15/2014   Procedure: ESOPHAGOGASTRODUODENOSCOPY (EGD);  Surgeon: [name], MD;  Location[facility] ENDOSCOPY;  Service: Endoscopy;  Laterality: N/A;  . TONSILLECTOMY      Home Medications:  Allergies as of 12/20/2019      Reactions   Ciprofloxacin Other (See Comments)   Erythromycin Swelling   Keflex [cephalexin] Swelling   Penicillins Swelling   Has patient had a PCN reaction causing immediate rash, facial/tongue/throat swelling, SOB or lightheadedness with hypotension: Yes Has patient had a PCN reaction causing severe rash involving mucus membranes or skin necrosis: No Has patient had a PCN reaction that required hospitalization: Unknown Has patient had a PCN reaction occurring within the last 10 years: No If all of the above answers are "NO", then may proceed with Cephalosporin use.      Medication List  Accurate as of December 20, 2019 11:59 PM. If you have any questions, ask your nurse or doctor.        STOP taking these medications   nystatin powder Commonly known as: MYCOSTATIN/NYSTOP Stopped by: Hollice Espy, MD     TAKE these medications   B-12 5000 MCG Caps Take 5,000 mg by mouth daily. What changed: how much to take   buPROPion 150 MG 24 hr tablet Commonly known as: WELLBUTRIN XL Take 1 tablet (150 mg total) by mouth daily.   clotrimazole-betamethasone cream Commonly known as: LOTRISONE Apply 1 application topically 2 (two) times  daily.   CO Q 10 PO Take 1 Dose by mouth daily.   diclofenac Sodium 1 % Gel Commonly known as: VOLTAREN Apply topically.   Eliquis 2.5 MG Tabs tablet Generic drug: apixaban Take 2.5 mg by mouth 2 (two) times daily.   esomeprazole 40 MG capsule Commonly known as: NEXIUM Take 40 mg by mouth daily.   feeding supplement (ENSURE ENLIVE) Liqd Take 237 mLs by mouth 2 (two) times daily between meals.   finasteride 5 MG tablet Commonly known as: PROSCAR Take 5 mg by mouth daily.   FLUoxetine 20 MG capsule Commonly known as: PROzac Take 1 capsule (20 mg total) by mouth daily. To be combined with 40 mg   FLUoxetine 40 MG capsule Commonly known as: PROzac Take 1 capsule (40 mg total) by mouth daily. To be combined with 20 mg   Multi-Vitamin tablet Take by mouth.   nystatin-triamcinolone ointment Commonly known as: MYCOLOG APPLY TO AFFECTED AREAS TWICE DAILY   rOPINIRole 0.5 MG tablet Commonly known as: REQUIP Take 1 tablet by mouth at bedtime.   simvastatin 40 MG tablet Commonly known as: ZOCOR Take 40 mg by mouth at bedtime.   Symbicort 160-4.5 MCG/ACT inhaler Generic drug: budesonide-formoterol Inhale into the lungs.   tamsulosin 0.4 MG Caps capsule Commonly known as: FLOMAX Take 0.4 mg by mouth daily.   Vitamin B Complex Tabs Take by mouth.       Allergies:  Allergies  Allergen Reactions  . Ciprofloxacin Other (See Comments)  . Erythromycin Swelling  . Keflex [Cephalexin] Swelling  . Penicillins Swelling    Has patient had a PCN reaction causing immediate rash, facial/tongue/throat swelling, SOB or lightheadedness with hypotension: Yes Has patient had a PCN reaction causing severe rash involving mucus membranes or skin necrosis: No Has patient had a PCN reaction that required hospitalization: Unknown Has patient had a PCN reaction occurring within the last 10 years: No If all of the above answers are "NO", then may proceed with Cephalosporin use.      Family History: Family History  Problem Relation Age of Onset  . Stroke Mother   . COPD Father     Social History:  reports that he quit smoking about 61 years ago. He has never used smokeless tobacco. He reports current alcohol use of about 1.0 standard drink of alcohol per week. He reports that he does not use drugs.   Physical Exam: BP (!) 143/76   Pulse 84   Constitutional:  Alert and oriented, No acute distress. HEENT: [place] AT, moist mucus membranes.  Trachea midline, no masses. Cardiovascular: No clubbing, cyanosis, or edema. Respiratory: Normal respiratory effort, no increased work of breathing. Skin: No rashes, bruises or suspicious lesions. Neurologic: Grossly intact, no focal deficits, moving all 4 extremities. Psychiatric: Normal mood and affect.  Laboratory Data:  Urinalysis Results for orders placed or performed in visit on 12/20/19  Microscopic Examination   Urine  Result Value Ref Range   WBC, UA 0-5 0 - 5 /hpf   RBC 0-2 0 - 2 /hpf   Epithelial Cells (non renal) 0-10 0 - 10 /hpf   Bacteria, UA None seen None seen/Few  Urinalysis, Complete  Result Value Ref Range   Specific Gravity, UA 1.025 1.005 - 1.030   pH, UA 5.5 5.0 - 7.5   Color, UA Yellow Yellow   Appearance Ur Clear Clear   Leukocytes,UA Negative Negative   Protein,UA Negative Negative/Trace   Glucose, UA Negative Negative   Ketones, UA Negative Negative   RBC, UA Negative Negative   Bilirubin, UA Negative Negative   Urobilinogen, Ur 0.2 0.2 - 1.0 mg/dL   Nitrite, UA Negative Negative   Microscopic Examination See below:      Pertinent Imaging: CLINICAL DATA:  79 year old male with right lower quadrant abdominal pain.  EXAM: CT ABDOMEN AND PELVIS WITH CONTRAST  TECHNIQUE: Multidetector CT imaging of the abdomen and pelvis was performed using the standard protocol following bolus administration of intravenous contrast.  CONTRAST:  137mL OMNIPAQUE IOHEXOL 300 MG/ML   SOLN  COMPARISON:  CT abdomen pelvis dated 05/27/2005.  FINDINGS: Lower chest: Minimal bibasilar and subpleural hazy densities may represent atelectatic changes. Atypical infection is not excluded. Clinical correlation is recommended. There is coronary vascular calcification.  No intra-abdominal free air or free fluid.  Hepatobiliary: There is apparent fatty infiltration of the liver. No intrahepatic biliary ductal dilatation. Indeterminate 17 mm hypodense lesion in the left lobe of the liver similar to the study of 2007, most consistent with a cyst. The gallbladder is unremarkable.  Pancreas: Unremarkable. No pancreatic ductal dilatation or surrounding inflammatory changes.  Spleen: Normal in size without focal abnormality.  Adrenals/Urinary Tract: The adrenal glands unremarkable. There is no hydronephrosis on either side. Several left renal cysts measure up to 2.5 cm. Additional subcentimeter left renal hypodense lesion is too small to characterize. Mild bilateral renal parenchyma atrophy. There is symmetric enhancement and excretion of contrast by both kidneys. The visualized ureters and urinary bladder appear unremarkable.  Stomach/Bowel: Several small scattered sigmoid diverticula without active inflammatory changes. There is a small hiatal hernia. There is no bowel obstruction or active inflammation. Appendectomy.  Vascular/Lymphatic: The abdominal aorta and IVC are unremarkable. No portal venous gas. There is no adenopathy.  Reproductive: The prostate and seminal vesicles are grossly unremarkable. No pelvic mass  Other: Small fat containing umbilical hernia.  Musculoskeletal: Osteopenia with degenerative changes of the spine. No acute osseous pathology.  IMPRESSION: 1. No acute intra-abdominal or pelvic pathology. No bowel obstruction. 2. Small scattered sigmoid diverticula. 3. Fatty liver. 4. Aortic Atherosclerosis  (ICD10-I70.0).   Electronically Signed   By: Anner Crete M.D.   On: 10/25/2019 16:18  I have personally reviewed the images and agree with radiologist interpretation.    Assessment & Plan:    1. Microscopic hematuria Will hold off on upper tract imaging as he just had a CT scan in 10/2019. Discussed a cystoscopy and patient was made aware of risk and benefits. Patient agreed.  Will consider upper tract imaging based on cystoscopy results.  He understands that the renal pelvis ease and ureters are not completely evaluated the risk of possible missed upper tract urothelial carcinoma albeit incidence is extremely low.  2. BPH with urinary frequency Continue Flomax and finasteride.   3. Chronic bacterial colonization  Patient grew pseudomonas in the past secondary to foreskin.   Schedule cystoscopy  [facility] [address], [place], [place] [address] [phone]  I, Selena Batten, am acting as a scribe for Dr. Hollice Espy.  I have reviewed the above documentation for accuracy and completeness, and I agree with the above.   Hollice Espy, MD

## 2019-12-20 ENCOUNTER — Other Ambulatory Visit: Payer: Self-pay

## 2019-12-20 ENCOUNTER — Ambulatory Visit (INDEPENDENT_AMBULATORY_CARE_PROVIDER_SITE_OTHER): Payer: Medicare Other | Admitting: Urology

## 2019-12-20 VITALS — BP 143/76 | HR 84

## 2019-12-20 DIAGNOSIS — R350 Frequency of micturition: Secondary | ICD-10-CM

## 2019-12-20 DIAGNOSIS — N401 Enlarged prostate with lower urinary tract symptoms: Secondary | ICD-10-CM

## 2019-12-20 DIAGNOSIS — N471 Phimosis: Secondary | ICD-10-CM | POA: Diagnosis not present

## 2019-12-20 DIAGNOSIS — R319 Hematuria, unspecified: Secondary | ICD-10-CM

## 2019-12-20 NOTE — Patient Instructions (Signed)
Cystoscopy Cystoscopy is a procedure that is used to help diagnose and sometimes treat conditions that affect the lower urinary tract. The lower urinary tract includes the bladder and the urethra. The urethra is the tube that drains urine from the bladder. Cystoscopy is done using a thin, tube-shaped instrument with a light and camera at the end (cystoscope). The cystoscope may be hard or flexible, depending on the goal of the procedure. The cystoscope is inserted through the urethra, into the bladder. Cystoscopy may be recommended if you have:  Urinary tract infections that keep coming back.  Blood in the urine (hematuria).  An inability to control when you urinate (urinary incontinence) or an overactive bladder.  Unusual cells found in a urine sample.  A blockage in the urethra, such as a urinary stone.  Painful urination.  An abnormality in the bladder found during an intravenous pyelogram (IVP) or CT scan. Cystoscopy may also be done to remove a sample of tissue to be examined under a microscope (biopsy). What are the risks? Generally, this is a safe procedure. However, problems may occur, including:  Infection.  Bleeding.  What happens during the procedure?  1. You will be given one or more of the following: ? A medicine to numb the area (local anesthetic). 2. The area around the opening of your urethra will be cleaned. 3. The cystoscope will be passed through your urethra into your bladder. 4. Germ-free (sterile) fluid will flow through the cystoscope to fill your bladder. The fluid will stretch your bladder so that your health care provider can clearly examine your bladder walls. 5. Your doctor will look at the urethra and bladder. 6. The cystoscope will be removed The procedure may vary among health care providers  What can I expect after the procedure? After the procedure, it is common to have: 1. Some soreness or pain in your abdomen and urethra. 2. Urinary symptoms.  These include: ? Mild pain or burning when you urinate. Pain should stop within a few minutes after you urinate. This may last for up to 1 week. ? A small amount of blood in your urine for several days. ? Feeling like you need to urinate but producing only a small amount of urine. Follow these instructions at home: General instructions  Return to your normal activities as told by your health care provider.   Do not drive for 24 hours if you were given a sedative during your procedure.  Watch for any blood in your urine. If the amount of blood in your urine increases, call your health care provider.  If a tissue sample was removed for testing (biopsy) during your procedure, it is up to you to get your test results. Ask your health care provider, or the department that is doing the test, when your results will be ready.  Drink enough fluid to keep your urine pale yellow.  Keep all follow-up visits as told by your health care provider. This is important. Contact a health care provider if you:  Have pain that gets worse or does not get better with medicine, especially pain when you urinate.  Have trouble urinating.  Have more blood in your urine. Get help right away if you:  Have blood clots in your urine.  Have abdominal pain.  Have a fever or chills.  Are unable to urinate. Summary  Cystoscopy is a procedure that is used to help diagnose and sometimes treat conditions that affect the lower urinary tract.  Cystoscopy is done using   a thin, tube-shaped instrument with a light and camera at the end.  After the procedure, it is common to have some soreness or pain in your abdomen and urethra.  Watch for any blood in your urine. If the amount of blood in your urine increases, call your health care provider.  If you were prescribed an antibiotic medicine, take it as told by your health care provider. Do not stop taking the antibiotic even if you start to feel better. This  information is not intended to replace advice given to you by your health care provider. Make sure you discuss any questions you have with your health care provider. Document Revised: 03/01/2018 Document Reviewed: 03/01/2018 Elsevier Patient Education  2020 Elsevier Inc.   

## 2019-12-21 LAB — URINALYSIS, COMPLETE
Bilirubin, UA: NEGATIVE
Glucose, UA: NEGATIVE
Ketones, UA: NEGATIVE
Leukocytes,UA: NEGATIVE
Nitrite, UA: NEGATIVE
Protein,UA: NEGATIVE
RBC, UA: NEGATIVE
Specific Gravity, UA: 1.025 (ref 1.005–1.030)
Urobilinogen, Ur: 0.2 mg/dL (ref 0.2–1.0)
pH, UA: 5.5 (ref 5.0–7.5)

## 2019-12-21 LAB — MICROSCOPIC EXAMINATION: Bacteria, UA: NONE SEEN

## 2019-12-25 ENCOUNTER — Ambulatory Visit: Payer: Medicare Other | Attending: Internal Medicine

## 2019-12-25 DIAGNOSIS — Z23 Encounter for immunization: Secondary | ICD-10-CM

## 2019-12-25 NOTE — Progress Notes (Signed)
   [id number]  Name:  Mark [name].    MRN: [id number] DOB: 09-Apr-1940  12/25/2019  [name] was observed post Covid-19 immunization for 15 minutes without incident. He was provided with Vaccine Information Sheet and instruction to access the V-Safe system.   [name] was instructed to call 911 with any severe reactions post vaccine: Marland Kitchen Difficulty breathing  . Swelling of face and throat  . A fast heartbeat  . A bad rash all over body  . Dizziness and weakness

## 2019-12-25 NOTE — Progress Notes (Signed)
   12/26/2019  CC:  Chief Complaint  Patient presents with  . Cysto    VAN:VBTY Mark [name]. is a 79 y.o. male who returns for a cystoscopy. Patient is accompanied by his wife.   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 He reports he said no issues with his penis, does not desire circumcision, but does feel like his penis is shrinking.  Most recent PSA 0.7 on 01/2019  CT A/P w/ contrast on 10/25/2019 noted no acute intra-abdominal or pelvic pathology. No bowel obstruction. Small scattered sigmoid diverticula. Fatty liver.  [facility] at [facility] on 10/30/2019 showed a few bacteria. Culture Pseudomonas aeruginosa. Recent UA on 12/05/2019 showed large blood, >50 RBC and rare bacteria. Urine culture was negative.   He has nocturia x 5.   Patient is on Eliquis.    Blood pressure 119/72, pulse 79, height 6' 3.9" (1.928 m), weight 239 lb (108.4 kg). NED. A&Ox3.   No respiratory distress   Abd soft, NT, ND Normal phallus with bilateral descended testicles  Cystoscopy Procedure Note  Patient identification was confirmed, informed consent was obtained, and patient was prepped using Betadine solution.  Lidocaine jelly was administered per urethral meatus.     Pre-Procedure: - Inspection reveals a normal caliber ureteral meatus.  Procedure: The flexible cystoscope was introduced without difficulty - No urethral strictures/lesions are present. - Enlarged prostate with no median lobe and bilateral coaptation   - Normal bladder neck - Bilateral ureteral orifices identified - Bladder mucosa  reveals no ulcers, tumors, or lesions - No bladder stones - Moderate trabeculation  Retroflexion shows no abnormalities.    Post-Procedure: - Patient tolerated the procedure well  Assessment/ Plan:  1. Microscopic hematuria Cysto is unremarkable. Recent CT scan was negative. Upper tract imaging deferred per  patient (see previous note)   2. BPH with urinary frequency Cystoscopy shows prostatomegaly  Continue Flomax and finasteride. We discussed surgical intervention such as TURP, Urolift and HoLEP.  Patient as provided with literature and will call back with decisions but will likely continue pharmacotherapy Follow up in 1 year.   F/u 1 year IPSS/ PVR  I, Selena Batten, am acting as a scribe for Dr. Hollice Espy.  I have reviewed the above documentation for accuracy and completeness, and I agree with the above.   Hollice Espy, MD

## 2019-12-26 ENCOUNTER — Other Ambulatory Visit: Payer: Self-pay

## 2019-12-26 ENCOUNTER — Ambulatory Visit (INDEPENDENT_AMBULATORY_CARE_PROVIDER_SITE_OTHER): Payer: Medicare Other | Admitting: Urology

## 2019-12-26 ENCOUNTER — Encounter: Payer: Self-pay | Admitting: Urology

## 2019-12-26 VITALS — BP 119/72 | HR 79 | Ht 75.9 in | Wt 239.0 lb

## 2019-12-26 DIAGNOSIS — R319 Hematuria, unspecified: Secondary | ICD-10-CM | POA: Diagnosis not present

## 2019-12-26 MED ORDER — LIDOCAINE HCL URETHRAL/MUCOSAL 2 % EX GEL
1.0000 | Freq: Once | CUTANEOUS | Status: AC
Start: 2019-12-26 — End: 2019-12-26
  Administered 2019-12-26: 1 via URETHRAL

## 2019-12-26 NOTE — Patient Instructions (Signed)
Transurethral Resection of the Prostate Transurethral resection of the prostate (TURP) is the removal (resection) of part of the gland that produces semen (prostate gland). This procedure is done to treat benign prostatic hyperplasia (BPH). BPH is an abnormal, noncancerous (benign) increase in the number of cells that make up the prostate tissue. BPH causes the prostate to get bigger. The enlarged prostate can push against or block the tube that drains urine from the bladder out of the body (urethra). BPH can affect normal urine flow by causing bladder infections, difficulty controlling bladder function, and difficulty emptying the bladder.  The goal of TURP is to remove enough prostate tissue to allow for a normal flow of urine. The procedure will allow you to empty your bladder more completely when you urinate so that you can urinate less often. In a transurethral resection, a thin telescope with a light, a tiny camera, and an electric cutting edge (resectoscope) is passed through the urethra and into the prostate. The opening of the urethra is at the end of the penis. Tell a health care provider about:  Any allergies you have.  All medicines you are taking, including vitamins, herbs, eye drops, creams, and over-the-counter medicines.  Any problems you or family members have had with anesthetic medicines.  Any blood disorders you have.  Any surgeries you have had.  Any medical conditions you have.  Any prostate infections you have had. What are the risks? Generally, this is a safe procedure. However, problems may occur, including: 1. Infection. 2. Bleeding. 3. Allergic reactions to medicines. 4. Damage to other structures or organs, such as: ? The urethra. ? The bladder. ? Muscles that surround the prostate. 5. Difficulty getting an erection. 6. Inability to control when you urinate (incontinence). 7. Scarring, which may cause problems with urine flow. What happens before the  procedure? Medicines Ask your health care provider about:  Changing or stopping your regular medicines. This is especially important if you are taking diabetes medicines or blood thinners.  Taking medicines such as aspirin and ibuprofen. These medicines can thin your blood. Do not take these medicines unless your health care provider tells you to take them.  Taking over-the-counter medicines, vitamins, herbs, and supplements.  General instructions 1. You may have a physical exam. 2. You may have a blood or urine sample taken. 3. Ask your health care provider what steps will be taken to help prevent infection. These may include: ? Washing skin with a germ-killing soap. ? Taking antibiotic medicine. 4. Plan to have someone take you home from the hospital or clinic. You may not be able to drive for up to 10 days after your procedure. 5. Plan to have a responsible adult care for you for at least 24 hours after you leave the hospital or clinic. This is important. What happens during the procedure?  1. An IV will be inserted into one of your veins. 2. You will be given one or more of the following: ? A medicine to help you relax (sedative). ? A medicine to make you fall asleep (general anesthetic). ? A medicine that is injected into your spine to numb the area below and slightly above the injection site (spinal anesthetic). 3. Your legs will be placed in foot rests (stirrups) so that your legs are apart and your knees are bent. 4. The resectoscope will be passed through your urethra to your prostate. 5. Parts of your prostate will be resected using the cutting edge of the resectoscope. 6. The  resectoscope will be removed. 7. A small, thin tube (catheter) will be passed through your urethra and into your bladder. The catheter will drain urine into a bag outside of your body. ? Fluid may be passed through the catheter to keep the catheter open. The procedure may vary among health care  providers and hospitals. What happens after the procedure? 1. Your blood pressure, heart rate, breathing rate, and blood oxygen level will be monitored until you leave the hospital or clinic. 2. You may continue to receive fluids and medicines through an IV. 3. You may have some pain. Pain medicine will be available to help you. 4. You will have a catheter draining your urine. ? You may have blood in your urine. Your catheter may be kept in until your urine is clear. ? Your urinary drainage will be monitored. If necessary, your bladder may be rinsed out (irrigated) through your catheter. 5. You will be encouraged to walk around as soon as possible. 6. You may have to wear compression stockings. These stockings help prevent blood clots and reduce swelling in your legs. 7. Do not drive for 24 hours if you were given a sedative during your procedure. Summary  Transurethral resection of the prostate (TURP) is the removal (resection) of part of the gland that produces semen (prostate gland).  The goal of this procedure is to remove enough prostate tissue to allow for a normal flow of urine.  Follow instructions from your health care provider about taking medicines and about eating and drinking before the procedure. This information is not intended to replace advice given to you by your health care provider. Make sure you discuss any questions you have with your health care provider. Document Revised: 06/29/2018 Document Reviewed: 12/08/2017 Elsevier Patient Education  [place] Laser Enucleation of the Prostate (HoLEP)  HoLEP is a treatment for men with benign prostatic hyperplasia (BPH). The laser surgery removed blockages of urine flow, and is done without any incisions on the body.     What is HoLEP?  HoLEP is a type of laser surgery used to treat obstruction (blockage) of urine flow as a result of benign prostatic hyperplasia (BPH). In men with BPH, the prostate gland  is not cancerous, but has become enlarged. An enlarged prostate can result in a number of urinary tract symptoms such as weak urinary stream, difficulty in starting urination, inability to urinate, frequent urination, or getting up at night to urinate.  HoLEP was developed in the 1990's as a more effective and less expensive surgical option for BPH, compared to other surgical options such as laser vaporization(PVP/greenlight laser), transurethral resection of the prostate(TURP), and open simple prostatectomy.   What happens during a HoLEP?  HoLEP requires general anesthesia ("asleep" throughout the procedure).   An antibiotic is given to reduce the risk of infection  A surgical instrument called a resectoscope is inserted through the urethra (the tube that carries urine from the bladder). The resectoscope has a camera that allows the surgeon to view the internal structure of the prostate gland, and to see where the incisions are being made during surgery.  The laser is inserted into the resectoscope and is used to enucleate (free up) the enlarged prostate tissue from the capsule (outer shell) and then to seal up any blood vessels. The tissue that has been removed is pushed back into the bladder.  A morcellator is placed through the resectoscope, and is used to suction out the prostate tissue that has  been pushed into the bladder.  When the prostate tissue has been removed, the resectoscope is removed, and a foley catheter is placed to allow healing and drain the urine from the bladder.     What happens after a HoLEP?  More than 90% of patients go home the same day a few hours after surgery. Less than 10% will be admitted to the hospital overnight for observation to monitor the urine, or if they have other medical problems.  Fluid is flushed through the catheter for about 1 hour after surgery to clear any blood from the urine. It is normal to have some blood in the urine after surgery. The  need for blood transfusion is extremely rare.  Eating and drinking are permitted after the procedure once the patient has fully awakened from anesthesia.  The catheter is usually removed 2-3 days after surgery- the patient will come to clinic to have the catheter removed and make sure they can urinate on their own.  It is very important to drink lots of fluids after surgery for one week to keep the bladder flushed.  At first, there may be some burning with urination, but this typically improved within a few hours to days. Most patients do not have a significant amount of pain, and narcotic pain medications are rarely needed.  Symptoms of urinary frequency, urgency, and even leakage are NORMAL for the first few weeks after surgery as the bladder adjusts after having to work hard against blockage from the prostate for many years. This will improve, but can sometimes take several months.  The use of pelvic floor exercises (Kegel exercises) can help improve problems with urinary incontinence.   After catheter removal, patients will be seen at 6 weeks and 6 months for symptom check  No heavy lifting for at least 2-3 weeks after surgery, however patients can walk and do light activities the first day after surgery. Return to work time depends on occupation.    What are the advantages of HoLEP?  HoLEP has been studied in many different parts of the world and has been shown to be a safe and effective procedure. Although there are many types of BPH surgeries available, HoLEP offers a unique advantage in being able to remove a large amount of tissue without any incisions on the body, even in very large prostates, while decreasing the risk of bleeding and providing tissue for pathology (to look for cancer). This decreases the need for blood transfusions during surgery, minimizes hospital stay, and reduces the risk of needing repeat treatment.  What are the side effects of HoLEP?  Temporary burning and  bleeding during urination. Some blood may be seen in the urine for weeks after surgery and is part of the healing process.  Urinary incontinence (inability to control urine flow) is expected in all patients immediately after surgery and they should wear pads for the first few days/weeks. This typically improves over the course of several weeks. Performing Kegel exercises can help decrease leakage from stress maneuvers such as coughing, sneezing, or lifting. The rate of long term leakage is very low. Patients may also have leakage with urgency and this may be treated with medication. The risk of urge incontinence can be dependent on several factors including age, prostate size, symptoms, and other medical problems.  Retrograde ejaculation or "backwards ejaculation." In 75% of cases, the patient will not see any fluid during ejaculation after surgery.  Erectile function is generally not significantly affected.   What are the  risks of HoLEP?  Injury to the urethra or development of scar tissue at a later date  Injury to the capsule of the prostate (typically treated with longer catheterization).  Injury to the bladder or ureteral orifices (where the urine from the kidney drains out)  Infection of the bladder, testes, or kidneys  Return of urinary obstruction at a later date requiring another operation (<2%)  Need for blood transfusion or re-operation due to bleeding  Failure to relieve all symptoms and/or need for prolonged catheterization after surgery  5-15% of patients are found to have previously undiagnosed prostate cancer in their specimen. Prostate cancer can be treated after HoLEP.  Standard risks of anesthesia including blood clots, heart attacks, etc  When should I call my doctor?  Fever over 101.3 degrees  Inability to urinate, or large blood clots in the urine

## 2020-01-12 ENCOUNTER — Other Ambulatory Visit: Payer: Self-pay

## 2020-01-12 ENCOUNTER — Encounter: Payer: Self-pay | Admitting: Psychiatry

## 2020-01-12 ENCOUNTER — Telehealth (INDEPENDENT_AMBULATORY_CARE_PROVIDER_SITE_OTHER): Payer: Medicare Other | Admitting: Psychiatry

## 2020-01-12 DIAGNOSIS — G4701 Insomnia due to medical condition: Secondary | ICD-10-CM | POA: Diagnosis not present

## 2020-01-12 DIAGNOSIS — F3342 Major depressive disorder, recurrent, in full remission: Secondary | ICD-10-CM

## 2020-01-12 MED ORDER — FLUOXETINE HCL 40 MG PO CAPS: 40.0000 mg | ORAL_CAPSULE | Freq: Every day | ORAL | 1 refills | Status: DC

## 2020-01-12 MED ORDER — FLUOXETINE HCL 20 MG PO CAPS: 20.0000 mg | ORAL_CAPSULE | Freq: Every day | ORAL | 1 refills | Status: DC

## 2020-01-12 MED ORDER — BUPROPION HCL ER (XL) 150 MG PO TB24: 150.0000 mg | ORAL_TABLET | Freq: Every day | ORAL | 1 refills | Status: DC

## 2020-01-12 NOTE — Progress Notes (Signed)
Virtual Visit via Telephone Note  I connected with Mark [name]. on 01/12/20 at 10:00 AM EDT by telephone and verified that I am speaking with the correct person using two identifiers.  Location Provider Location : [facility] Patient Location : Home  Participants: Patient ,Wife, Provider    I discussed the limitations, risks, security and privacy concerns of performing an evaluation and management service by telephone and the availability of in person appointments. I also discussed with the patient that there may be a patient responsible charge related to this service. The patient expressed understanding and agreed to proceed.     I discussed the assessment and treatment plan with the patient. The patient was provided an opportunity to ask questions and all were answered. The patient agreed with the plan and demonstrated an understanding of the instructions.   The patient was advised to call back or seek an in-person evaluation if the symptoms worsen or if the condition fails to improve as anticipated.     [facility] MD OP Progress Note  01/12/2020 10:21 AM [name].  MRN:  [id number]  Chief Complaint:  Chief Complaint    Follow-up     HPI: Mark [name]. is a 79 year old Caucasian male, retired, lives in [place], has a history of MDD, insomnia, pneumothorax, history of syncopal episodes, BPH, history of basal cell carcinoma, hyperlipidemia, vitamin B12 deficiency, restless leg syndrome, recent diagnosis of obstructive sleep apnea, history of PE was evaluated by phone today.  Patient today reports he is doing well with regards to his mood.  Denies any significant depression or anxiety symptoms.  He reports the current medications as keeping his mood symptoms stable.  He denies side effects.  He reports he continues to have to wake up every 2 hours or so to urinate at night.  He however is able to fall back asleep okay.  He is not interested in a sleep medication.  He continues to  be noncompliant with CPAP.  Patient denies any suicidality, homicidality or perceptual disturbances.  His wife continues to be supportive and assisted in the evaluation today.  Patient reports he wants to stay on his medications and is not interested in tapering off at this point.  Patient denies any other concerns today.  Visit Diagnosis:    ICD-10-CM   1. MDD (major depressive disorder), recurrent, in full remission ([place])  F33.42 buPROPion (WELLBUTRIN XL) 150 MG 24 hr tablet  2. Insomnia due to medical condition  G47.01 FLUoxetine (PROZAC) 20 MG capsule    FLUoxetine (PROZAC) 40 MG capsule    Past Psychiatric History: I have reviewed past psychiatric history from my progress note on 04/20/2018.  Past trials of Prozac, Wellbutrin  Past Medical History:  Past Medical History:  Diagnosis Date   Anxiety    Atrial fibrillation (HCC)    Back pain, chronic    Cancer (HCC)    skin   Colon polyp    Cyst of left kidney    GERD (gastroesophageal reflux disease)    Heart murmur    Hyperlipemia    Mitral valve prolapse    Neuromuscular disorder (HCC)    Neuropathy    Osteoarthritis    Pulmonary emboli (HCC)    Spinal stenosis    Thyroid nodule    Vertigo     Past Surgical History:  Procedure Laterality Date   APPENDECTOMY     BACK SURGERY     COLONOSCOPY N/A 08/15/2014   Procedure: COLONOSCOPY;  Surgeon: [name], MD;  Location: [facility] ENDOSCOPY;  Service: Endoscopy;  Laterality: N/A;   ESOPHAGOGASTRODUODENOSCOPY N/A 08/15/2014   Procedure: ESOPHAGOGASTRODUODENOSCOPY (EGD);  Surgeon: [name], MD;  Location[facility] ENDOSCOPY;  Service: Endoscopy;  Laterality: N/A;   TONSILLECTOMY      Family Psychiatric History: I have reviewed family psychiatric history from my progress note from 04/20/2018  Family History:  Family History  Problem Relation Age of Onset   Stroke Mother    COPD Father     Social History: I have reviewed social history  from my progress note on 04/20/2018 Social History   Socioeconomic History   Marital status: Married    Spouse name: gail   Number of children: 2   Years of education: Not on file   Highest education level: Master's degree (e.g., MA, MS, MEng, MEd, MSW, MBA)  Occupational History   Not on file  Tobacco Use   Smoking status: Former Smoker    Quit date: 04/20/1958    Years since quitting: 61.7   Smokeless tobacco: Never Used  Vaping Use   Vaping Use: Never used  Substance and Sexual Activity   Alcohol use: Yes    Alcohol/week: 1.0 standard drink    Types: 1 Glasses of wine per week   Drug use: Never   Sexual activity: Not Currently  Other Topics Concern   Not on file  Social History Narrative   Not on file   Social Determinants of Health   Financial Resource Strain:    Difficulty of Paying Living Expenses: Not on file  Food Insecurity:    Worried About [place] in the Last Year: Not on file   Ran Out of Food in the Last Year: Not on file  Transportation Needs:    Lack of Transportation (Medical): Not on file   Lack of Transportation (Non-Medical): Not on file  Physical Activity:    Days of Exercise per Week: Not on file   Minutes of Exercise per Session: Not on file  Stress:    Feeling of Stress : Not on file  Social Connections:    Frequency of Communication with Friends and Family: Not on file   Frequency of Social Gatherings with Friends and Family: Not on file   Attends Religious Services: Not on file   Active Member of Clubs or Organizations: Not on file   Attends Archivist Meetings: Not on file   Marital Status: Not on file    Allergies:  Allergies  Allergen Reactions   Ciprofloxacin Other (See Comments)   Erythromycin Swelling   Keflex [Cephalexin] Swelling   Penicillins Swelling    Has patient had a PCN reaction causing immediate rash, facial/tongue/throat swelling, SOB or lightheadedness with  hypotension: Yes Has patient had a PCN reaction causing severe rash involving mucus membranes or skin necrosis: No Has patient had a PCN reaction that required hospitalization: Unknown Has patient had a PCN reaction occurring within the last 10 years: No If all of the above answers are "NO", then may proceed with Cephalosporin use.     Metabolic Disorder Labs: No results found for: HGBA1C, MPG No results found for: PROLACTIN No results found for: CHOL, TRIG, HDL, CHOLHDL, VLDL, LDLCALC Lab Results  Component Value Date   TSH 0.866 02/21/2018    Therapeutic Level Labs: No results found for: LITHIUM No results found for: VALPROATE No components found for:  CBMZ  Current Medications: Current Outpatient Medications  Medication Sig Dispense Refill  apixaban (ELIQUIS) 2.5 MG TABS tablet Take 2.5 mg by mouth 2 (two) times daily.     B Complex Vitamins (VITAMIN B COMPLEX) TABS Take by mouth.     buPROPion (WELLBUTRIN XL) 150 MG 24 hr tablet Take 1 tablet (150 mg total) by mouth daily. 90 tablet 1   clotrimazole-betamethasone (LOTRISONE) cream Apply 1 application topically 2 (two) times daily.      Coenzyme Q10 (CO Q 10 PO) Take 1 Dose by mouth daily.      Cranberry (ELLURA) 200 MG CAPS Take 200 mg by mouth daily.     Cyanocobalamin (B-12) 5000 MCG CAPS Take 5,000 mg by mouth daily. (Patient taking differently: Take 5,000 mcg by mouth daily. ) 30 capsule 0   esomeprazole (NEXIUM) 40 MG capsule Take 40 mg by mouth daily.     finasteride (PROSCAR) 5 MG tablet Take 5 mg by mouth daily.      FLUoxetine (PROZAC) 20 MG capsule Take 1 capsule (20 mg total) by mouth daily. To be combined with 40 mg 90 capsule 1   FLUoxetine (PROZAC) 40 MG capsule Take 1 capsule (40 mg total) by mouth daily. To be combined with 20 mg 90 capsule 1   nystatin-triamcinolone ointment (MYCOLOG) APPLY TO AFFECTED AREAS TWICE DAILY 30 g 1   simvastatin (ZOCOR) 40 MG tablet Take 40 mg by mouth at bedtime.       tamsulosin (FLOMAX) 0.4 MG CAPS capsule Take 0.4 mg by mouth daily.      VITAMIN D PO Take by mouth daily.     Current Facility-Administered Medications  Medication Dose Route Frequency Provider Last Rate Last Admin   nystatin (MYCOSTATIN/NYSTOP) topical powder   Topical BID Hollice Espy, MD         Musculoskeletal: Strength & Muscle Tone: UTA Gait & Station: UTA Patient leans: N/A  Psychiatric Specialty Exam: Review of Systems  Genitourinary: Positive for frequency and urgency.  Psychiatric/Behavioral: Positive for sleep disturbance.  All other systems reviewed and are negative.   There were no vitals taken for this visit.There is no height or weight on file to calculate BMI.  General Appearance: UTA  Eye Contact:  UTA  Speech:  Clear and Coherent  Volume:  Normal  Mood:  Euthymic  Affect:  UTA  Thought Process:  Goal Directed and Descriptions of Associations: Intact  Orientation:  Full (Time, Place, and Person)  Thought Content: Logical   Suicidal Thoughts:  No  Homicidal Thoughts:  No  Memory:  Immediate;   Fair Recent;   Fair Remote;   Fair  Judgement:  Fair  Insight:  Fair  Psychomotor Activity:  UTA  Concentration:  Concentration: Fair and Attention Span: Fair  Recall:  [employer] of Knowledge: Fair  Language: Fair  Akathisia:  No  Handed:  Right  AIMS (if indicated): UTA  Assets:  Communication Skills Desire for Improvement Housing Social Support  ADL's:  Intact  Cognition: WNL  Sleep:  restless   Screenings:   Assessment and Plan: Jahn Franchini. is a 79 year old Caucasian male, lives in [place], has a history of depression, BPH, history of falls, syncopal episodes, basal cell carcinoma, hyperlipidemia was evaluated by phone today.  Patient is currently stable on current medication regimen.  Plan as noted below.  Plan MDD in full remission Wellbutrin extended release 150 mg p.o. daily. Prozac 60 mg p.o.  daily   Insomnia-restless Patient to work on sleep hygiene techniques. He is not interested in sleep medication.  He does have frequent urination due to BPH which does have an impact on his sleep. Noncompliant with CPAP.  Follow-up in clinic in 6 months or sooner if needed.  I have spent atleast 19 minutes non face to face with patient today. More than 50 % of the time was spent for preparing to see the patient ( e.g., review of test, records ),  ordering medications and test ,psychoeducation and supportive psychotherapy and care coordination,as well as documenting clinical information in electronic health record. This note was generated in part or whole with voice recognition software. Voice recognition is usually quite accurate but there are transcription errors that can and very often do occur. I apologize for any typographical errors that were not detected and corrected.         Ursula Alert, MD 01/12/2020, 10:21 AM

## 2020-02-01 ENCOUNTER — Ambulatory Visit: Payer: Medicare Other | Admitting: Urology

## 2020-02-04 ENCOUNTER — Other Ambulatory Visit: Payer: Self-pay

## 2020-02-04 ENCOUNTER — Emergency Department
Admission: EM | Admit: 2020-02-04 | Discharge: 2020-02-04 | Disposition: A | Discharge status: Home / Self Care | Payer: Medicare Other | Attending: Emergency Medicine | Admitting: Emergency Medicine

## 2020-02-04 ENCOUNTER — Emergency Department: Payer: Medicare Other

## 2020-02-04 DIAGNOSIS — M6210 Other rupture of muscle (nontraumatic), unspecified site: Secondary | ICD-10-CM | POA: Diagnosis not present

## 2020-02-04 DIAGNOSIS — K219 Gastro-esophageal reflux disease without esophagitis: Secondary | ICD-10-CM | POA: Diagnosis not present

## 2020-02-04 DIAGNOSIS — I10 Essential (primary) hypertension: Secondary | ICD-10-CM | POA: Insufficient documentation

## 2020-02-04 DIAGNOSIS — Z79899 Other long term (current) drug therapy: Secondary | ICD-10-CM | POA: Insufficient documentation

## 2020-02-04 DIAGNOSIS — Z7901 Long term (current) use of anticoagulants: Secondary | ICD-10-CM | POA: Insufficient documentation

## 2020-02-04 DIAGNOSIS — Z85828 Personal history of other malignant neoplasm of skin: Secondary | ICD-10-CM | POA: Insufficient documentation

## 2020-02-04 DIAGNOSIS — Z87891 Personal history of nicotine dependence: Secondary | ICD-10-CM | POA: Insufficient documentation

## 2020-02-04 DIAGNOSIS — I4891 Unspecified atrial fibrillation: Secondary | ICD-10-CM | POA: Insufficient documentation

## 2020-02-04 DIAGNOSIS — R1032 Left lower quadrant pain: Secondary | ICD-10-CM | POA: Diagnosis present

## 2020-02-04 DIAGNOSIS — T148XXA Other injury of unspecified body region, initial encounter: Secondary | ICD-10-CM

## 2020-02-04 LAB — URINALYSIS, COMPLETE (UACMP) WITH MICROSCOPIC
Bacteria, UA: NONE SEEN
Bilirubin Urine: NEGATIVE
Glucose, UA: NEGATIVE mg/dL
Hgb urine dipstick: NEGATIVE
Ketones, ur: NEGATIVE mg/dL
Leukocytes,Ua: NEGATIVE
Nitrite: NEGATIVE
Protein, ur: NEGATIVE mg/dL
Specific Gravity, Urine: 1.035 — ABNORMAL HIGH (ref 1.005–1.030)
pH: 5 (ref 5.0–8.0)

## 2020-02-04 LAB — COMPREHENSIVE METABOLIC PANEL
ALT: 24 U/L (ref 0–44)
AST: 24 U/L (ref 15–41)
Albumin: 3.7 g/dL (ref 3.5–5.0)
Alkaline Phosphatase: 60 U/L (ref 38–126)
Anion gap: 12 (ref 5–15)
BUN: 15 mg/dL (ref 8–23)
CO2: 20 mmol/L — ABNORMAL LOW (ref 22–32)
Calcium: 8.7 mg/dL — ABNORMAL LOW (ref 8.9–10.3)
Chloride: 105 mmol/L (ref 98–111)
Creatinine, Ser: 0.89 mg/dL (ref 0.61–1.24)
GFR, Estimated: >60 mL/min (ref >60)
Glucose, Bld: 129 mg/dL — ABNORMAL HIGH (ref 70–99)
Potassium: 3.9 mmol/L (ref 3.5–5.1)
Sodium: 137 mmol/L (ref 135–145)
Total Bilirubin: 0.9 mg/dL (ref 0.3–1.2)
Total Protein: 6.7 g/dL (ref 6.5–8.1)

## 2020-02-04 LAB — CBC
HCT: 45 % (ref 39.0–52.0)
Hemoglobin: 15 g/dL (ref 13.0–17.0)
MCH: 31.3 pg (ref 26.0–34.0)
MCHC: 33.3 g/dL (ref 30.0–36.0)
MCV: 93.9 fL (ref 80.0–100.0)
Platelets: 226 10*3/uL (ref 150–400)
RBC: 4.79 MIL/uL (ref 4.22–5.81)
RDW: 12.4 % (ref 11.5–15.5)
WBC: 9.1 10*3/uL (ref 4.0–10.5)
nRBC: 0 % (ref 0.0–0.2)

## 2020-02-04 LAB — LIPASE, BLOOD: Lipase: 28 U/L (ref 11–51)

## 2020-02-04 MED ORDER — IOHEXOL 300 MG/ML  SOLN
100.0000 mL | Freq: Once | INTRAMUSCULAR | Status: AC | PRN
  Administered 2020-02-04: 100 mL via INTRAVENOUS

## 2020-02-04 MED ORDER — TRAMADOL HCL 50 MG PO TABS
50.0000 mg | ORAL_TABLET | Freq: Four times a day (QID) | ORAL | 0 refills | Status: DC | PRN
Start: 2020-02-04 — End: 2022-03-05

## 2020-02-04 MED ORDER — TRAMADOL HCL 50 MG PO TABS
50.0000 mg | ORAL_TABLET | Freq: Once | ORAL | Status: AC
  Administered 2020-02-04: 50 mg via ORAL
  Filled 2020-02-04: qty 1

## 2020-02-04 NOTE — ED Provider Notes (Signed)
[facility] Emergency Department Provider Note ____________________________________________   First MD Initiated Contact with Patient 02/04/20 1816     (approximate)  I have reviewed the triage vital signs and the nursing notes.   HISTORY  Chief Complaint Abdominal Pain  HPI Mark Olivero. is a 79 y.o. male with history as listed below presents to the emergency department for treatment and evaluation of pain in the left lower abdomen.  Symptoms have gradually worsened over the past couple of days and now has pain with movement, standing and walking.  No specific injury.  He has had some abdominal issues recently and has had a negative CT of the abdomen and pelvis.  No relief with Tylenol.  No fever.  No dysuria..         Past Medical History:  Diagnosis Date   Anxiety    Atrial fibrillation (HCC)    Back pain, chronic    Cancer (HCC)    skin   Colon polyp    Cyst of left kidney    GERD (gastroesophageal reflux disease)    Heart murmur    Hyperlipemia    Mitral valve prolapse    Neuromuscular disorder (HCC)    Neuropathy    Osteoarthritis    Pulmonary emboli ([place])    Spinal stenosis    Thyroid nodule    Vertigo     Patient Active Problem List   Diagnosis Date Noted   MDD (major depressive disorder), recurrent episode, mild ([place]) 11/04/2018   Insomnia due to medical condition 11/04/2018   Fall 03/02/2018   Pleural effusion    Weakness 02/22/2018   Nausea 02/21/2018   Near syncope 10/24/2017   Orthostatic hypotension 10/24/2017   History of pulmonary embolus (PE) 08/12/2017   PAF (paroxysmal atrial fibrillation) ([place]) 08/12/2017   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   Abnormal cardiovascular function study 01/07/2017   Lightheadedness 12/23/2016   Lower extremity numbness 12/23/2016   Orthostatic dizziness 12/23/2016   Weakness of both lower extremities 12/23/2016   Arthritis  11/17/2016   SOB (shortness of breath) 11/16/2016   DDD (degenerative disc disease), lumbar 07/01/2016   Lumbosacral spondylosis without myelopathy 01/07/2016   Chronic bilateral low back pain without sciatica 10/10/2015   Primary osteoarthritis of right shoulder 06/25/2015   H/O adenomatous polyp of colon 07/20/2014   Chronic anticoagulation 10/04/2012   Anxiety 09/11/2011   Cyst of left kidney 09/11/2011   GERD (gastroesophageal reflux disease) 09/11/2011   Heart murmur 09/11/2011   Hyperlipidemia 09/11/2011   Neuropathy 09/11/2011   Pulmonary embolus ([address]) 09/11/2011   Back pain, chronic 09/11/2011    Past Surgical History:  Procedure Laterality Date   APPENDECTOMY     BACK SURGERY     COLONOSCOPY N/A 08/15/2014   Procedure: COLONOSCOPY;  Surgeon: [name], MD;  Location: [facility];  Service: Endoscopy;  Laterality: N/A;   ESOPHAGOGASTRODUODENOSCOPY N/A 08/15/2014   Procedure: ESOPHAGOGASTRODUODENOSCOPY (EGD);  Surgeon: [name], MD;  Location: [facility] ENDOSCOPY;  Service: Endoscopy;  Laterality: N/A;   TONSILLECTOMY      Prior to Admission medications   Medication Sig Start Date End Date Taking? Authorizing Provider  apixaban (ELIQUIS) 2.5 MG TABS tablet Take 2.5 mg by mouth 2 (two) times daily.    [provider]  B Complex Vitamins (VITAMIN B COMPLEX) TABS Take by mouth.    [provider]  buPROPion (WELLBUTRIN XL) 150 MG 24 hr tablet Take 1 tablet (150 mg total) by  mouth daily. 01/12/20   Ursula Alert, MD  clotrimazole-betamethasone (LOTRISONE) cream Apply 1 application topically 2 (two) times daily.     [provider]  Coenzyme Q10 (CO Q 10 PO) Take 1 Dose by mouth daily.     [provider]  Cranberry (ELLURA) 200 MG CAPS Take 200 mg by mouth daily.    [provider]  Cyanocobalamin (B-12) 5000 MCG CAPS Take 5,000 mg by mouth daily. Patient taking differently: Take 5,000 mcg by mouth  daily.  02/22/18   Saundra Shelling, MD  esomeprazole (NEXIUM) 40 MG capsule Take 40 mg by mouth daily. 03/13/19   [provider]  finasteride (PROSCAR) 5 MG tablet Take 5 mg by mouth daily.  12/01/16   [provider]  FLUoxetine (PROZAC) 20 MG capsule Take 1 capsule (20 mg total) by mouth daily. To be combined with 40 mg 01/12/20   Ursula Alert, MD  FLUoxetine (PROZAC) 40 MG capsule Take 1 capsule (40 mg total) by mouth daily. To be combined with 20 mg 01/12/20   Ursula Alert, MD  nystatin-triamcinolone ointment (MYCOLOG) APPLY TO AFFECTED AREAS TWICE DAILY 10/11/19   Zara Council A, PA-C  simvastatin (ZOCOR) 40 MG tablet Take 40 mg by mouth at bedtime.     [provider]  tamsulosin (FLOMAX) 0.4 MG CAPS capsule Take 0.4 mg by mouth daily.     [provider]  traMADol (ULTRAM) 50 MG tablet Take 1 tablet (50 mg total) by mouth every 6 (six) hours as needed. 02/04/20   Ekta Dancer B, FNP  VITAMIN D PO Take by mouth daily.    [provider]    Allergies Ciprofloxacin, Erythromycin, Keflex [cephalexin], and Penicillins  Family History  Problem Relation Age of Onset   Stroke Mother    COPD Father     Social History Social History   Tobacco Use   Smoking status: Former Smoker    Quit date: 04/20/1958    Years since quitting: 61.8   Smokeless tobacco: Never Used  Vaping Use   Vaping Use: Never used  Substance Use Topics   Alcohol use: Yes    Alcohol/week: 1.0 standard drink    Types: 1 Glasses of wine per week   Drug use: Never    Review of Systems  Constitutional: No fever/chills Eyes: No visual changes. ENT: No sore throat. Cardiovascular: Denies chest pain. Respiratory: Denies shortness of breath. Gastrointestinal: Positive abdominal pain.  No nausea, no vomiting.  No diarrhea.  No constipation. Genitourinary: Negative for dysuria. Musculoskeletal: Negative for back pain. Skin: Negative for  rash. Neurological: Negative for headaches, focal weakness or numbness.  ____________________________________________   PHYSICAL EXAM:  VITAL SIGNS: ED Triage Vitals  Enc Vitals Group     BP 02/04/20 1630 (!) 126/52     Pulse Rate 02/04/20 1630 85     Resp 02/04/20 1630 14     Temp 02/04/20 1630 99.1 F (37.3 C)     Temp Source 02/04/20 1630 Oral     SpO2 02/04/20 1630 95 %     Weight --      Height --      Head Circumference --      Peak Flow --      Pain Score 02/04/20 1631 10     Pain Loc --      Pain Edu? --      Excl. in [place]? --     Constitutional: Alert and oriented. Well appearing and in no acute  distress. Eyes: Conjunctivae are normal. . Head: Atraumatic. Nose: No congestion/rhinnorhea. Mouth/Throat: Mucous membranes are moist.  Oropharynx non-erythematous. Neck: No stridor.   Hematological/Lymphatic/Immunilogical: No cervical lymphadenopathy. Cardiovascular: Normal rate, regular rhythm. Grossly normal heart sounds.  Good peripheral circulation. Respiratory: Normal respiratory effort.  No retractions. Lungs CTAB. Gastrointestinal: Soft and nontender with palpation.  No rebound tenderness.  No distention. No abdominal bruits. No CVA tenderness.  No obvious hernia.  Pain increases with movement. Genitourinary:  Musculoskeletal: No lower extremity tenderness nor edema.  No joint effusions. Neurologic:  Normal speech and language. No gross focal neurologic deficits are appreciated. No gait instability. Skin:  Skin is warm, dry and intact. No rash noted. Psychiatric: Mood and affect are normal. Speech and behavior are normal.  ____________________________________________   LABS (all labs ordered are listed, but only abnormal results are displayed)  Labs Reviewed  COMPREHENSIVE METABOLIC PANEL - Abnormal; Notable for the following components:      Result Value   CO2 20 (*)    Glucose, Bld 129 (*)    Calcium 8.7 (*)    All other components within normal limits   URINALYSIS, COMPLETE (UACMP) WITH MICROSCOPIC - Abnormal; Notable for the following components:   Color, Urine YELLOW (*)    APPearance HAZY (*)    Specific Gravity, Urine 1.035 (*)    All other components within normal limits  LIPASE, BLOOD  CBC   ____________________________________________  EKG  Not indicated ____________________________________________  RADIOLOGY  ED MD interpretation:    CT of the abdomen and pelvis shows an abductor tendinous retraction and small avulsion fracture off the ventral margin of the pubic symphysis compatible with a core muscle injury.  I, Sherrie George, personally viewed and evaluated these images (plain radiographs) as part of my medical decision making, as well as reviewing the written report by the radiologist.  Official radiology report(s): CT ABDOMEN PELVIS W CONTRAST  Result Date: 02/04/2020 CLINICAL DATA:  Left lower quadrant abdominal pain for several days EXAM: CT ABDOMEN AND PELVIS WITH CONTRAST TECHNIQUE: Multidetector CT imaging of the abdomen and pelvis was performed using the standard protocol following bolus administration of intravenous contrast. CONTRAST:  171mL OMNIPAQUE IOHEXOL 300 MG/ML  SOLN COMPARISON:  10/25/2019 FINDINGS: Lower chest: Stable bibasilar scarring and fibrosis. No acute pleural or parenchymal lung disease. Hepatobiliary: Stable mild hepatic steatosis. Small cysts within the left lobe liver unchanged. Gallbladder is unremarkable. Pancreas: Unremarkable. No pancreatic ductal dilatation or surrounding inflammatory changes. Spleen: Normal in size without focal abnormality. Adrenals/Urinary Tract: Stable left renal cyst. No urinary tract calculi or obstructive uropathy. The adrenals are unremarkable. Bladder is normal. Stomach/Bowel: No bowel obstruction or ileus. No bowel wall thickening or inflammatory change. Small hiatal hernia. Vascular/Lymphatic: No significant vascular findings are present. No enlarged abdominal or  pelvic lymph nodes. Reproductive: Prostate is unremarkable. Other: No free fluid or free gas.  No abdominal wall hernia. Musculoskeletal: There is enlargement and edema of the attachment of the rectus sheath as well as the aponeuroses of the bilateral abductor tendons overlying the pubic symphysis. Small hyperdense areas within the abductor tendons consistent with small hematomas or retracted tendons after tear. There is also small avulsion fracture off the ventral margin of the pubic symphysis, images 93-94 series 2. No other acute displaced fractures. Reconstructed images demonstrate no additional findings. IMPRESSION: 1. Marked edema within the rectus sheath and bilateral abductor tendons at the insertion on the pubic symphysis, with evidence of abductor tendinous retraction and small avulsion fracture off the ventral  margin of the pubic symphysis. Findings are compatible with core muscle injury. 2. Mild hepatic steatosis. 3. Small hiatal hernia. Electronically Signed   By: Randa Ngo M.D.   On: 02/04/2020 21:09    ____________________________________________   PROCEDURES  Procedure(s) performed (including Critical Care):  Procedures  ____________________________________________   INITIAL IMPRESSION / ASSESSMENT AND PLAN     79 year old male presenting to the emergency department for treatment and evaluation of left lower quadrant pain.  See HPI for further details.  Exam is overall reassuring however due to the location of pain and recent CT scan showing diverticulosis, there is a possibility of diverticulitis although the symptoms are not classic.  Plan will be to do a CT of the abdomen pelvis  DIFFERENTIAL DIAGNOSIS  Diverticulosis, diverticulitis, bowel obstruction, ileus, muscle strain  ED COURSE  CT of the abdomen and pelvis shows a core muscle tear.  He will be treated with tramadol and encouraged to use his walker to help with ambulation.  Significant other at bedside who  states she will encourage him to be consistent with his walker.  He was advised to follow-up with his primary care provider if he is not improving over the next week or so.  He was also advised to rest and apply ice to the area off and on for the next several days.  If her symptoms change or worsen or for new concerns if he is unable to see primary care, he is to return to the emergency department.    ___________________________________________   FINAL CLINICAL IMPRESSION(S) / ED DIAGNOSES  Final diagnoses:  Muscle tear     ED Discharge Orders         Ordered    traMADol (ULTRAM) 50 MG tablet  Every 6 hours PRN        02/04/20 2133           Mark Dresser Abdou Stocks. was evaluated in Emergency Department on 02/05/2020 for the symptoms described in the history of present illness. He was evaluated in the context of the global COVID-19 pandemic, which necessitated consideration that the patient might be at risk for infection with the SARS-CoV-2 virus that causes COVID-19. Institutional protocols and algorithms that pertain to the evaluation of patients at risk for COVID-19 are in a state of rapid change based on information released by regulatory bodies including the CDC and federal and state organizations. These policies and algorithms were followed during the patient's care in the ED.   Note:  This document was prepared using Dragon voice recognition software and may include unintentional dictation errors.   Mark Dike, FNP 02/05/20 2031    Delman Kitten, MD 02/09/20 [phone]

## 2020-02-04 NOTE — ED Triage Notes (Signed)
Pt presents via POV c/o LLQ abd pain for a couple of days. Non-tender to palpation. Reports walking makes it worse. Denies N/V/D.

## 2020-02-15 IMAGING — US US CAROTID DUPLEX BILAT
1 series · 13 of 24 positions shown · non-contrast
Comparison: None.

CLINICAL DATA: Dizziness

EXAM:
BILATERAL CAROTID DUPLEX ULTRASOUND
TECHNIQUE: Gray scale imaging, color Doppler and duplex ultrasound were
performed of bilateral carotid and vertebral arteries in the neck.

[Series 1: us carotid duplex bilat · 13 of 65 slices shown]
[im 1/65]
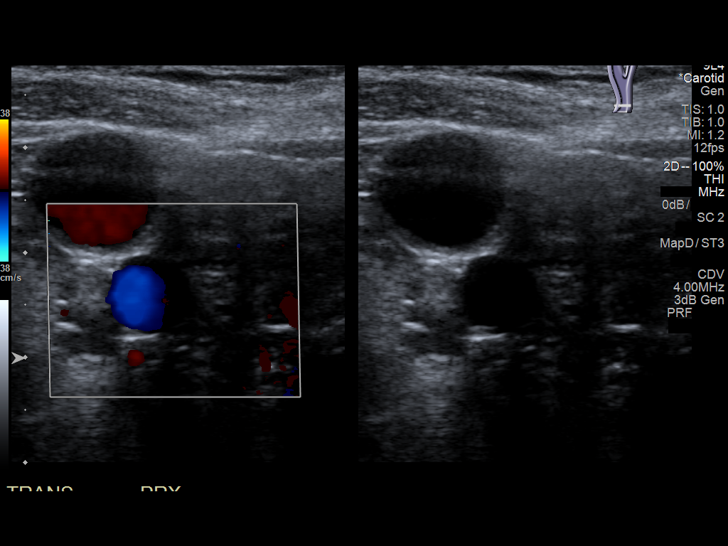
[im 6/65]
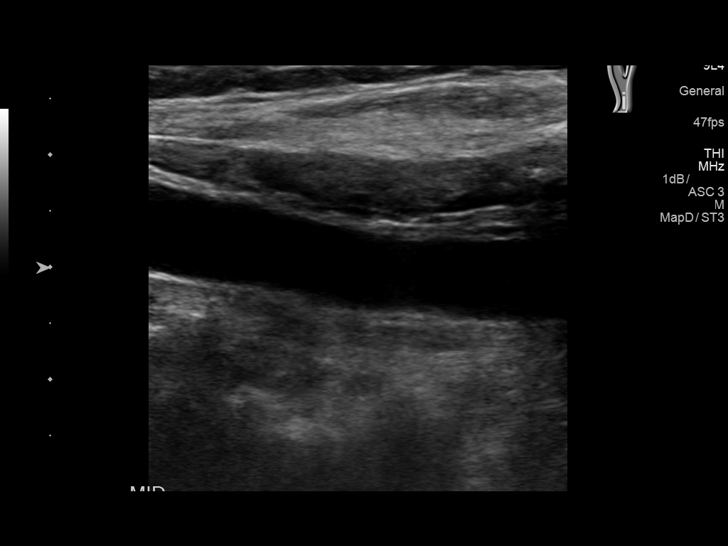
[im 12/65]
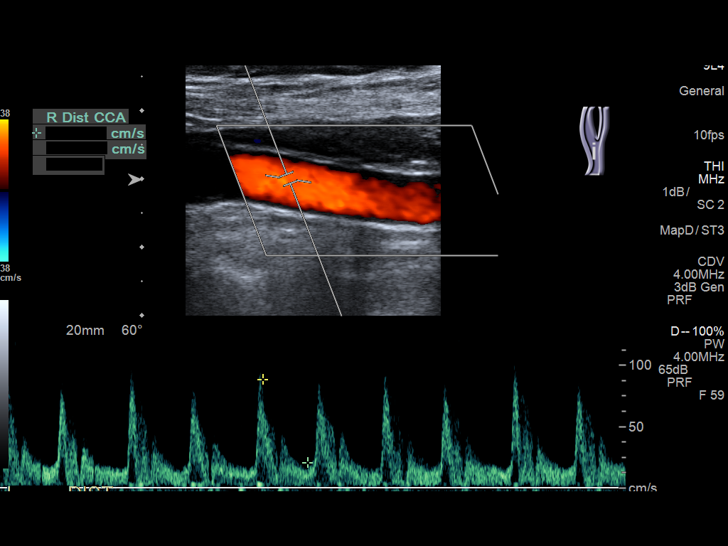
[im 17/65]
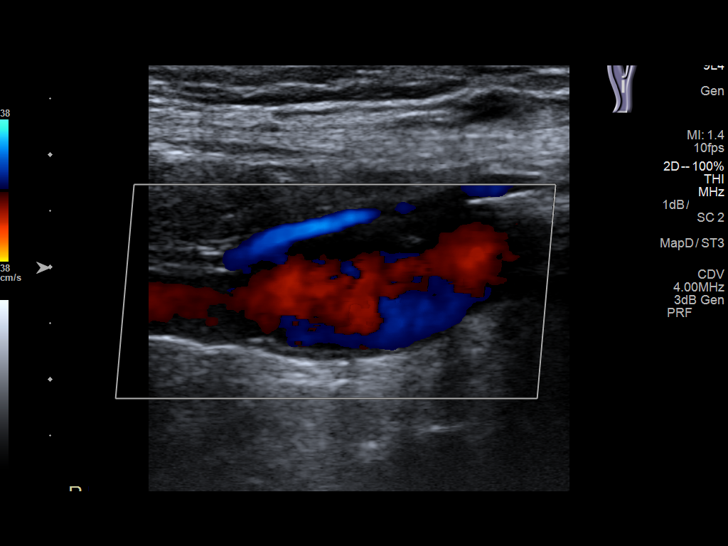
[im 23/65]
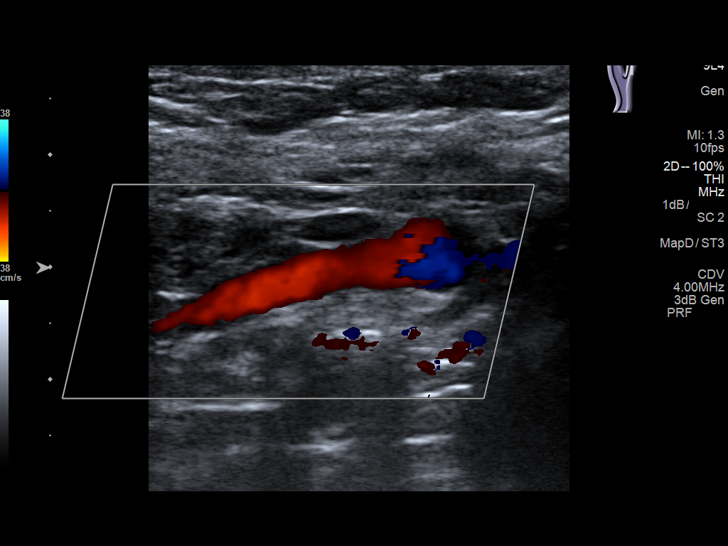
[im 28/65]
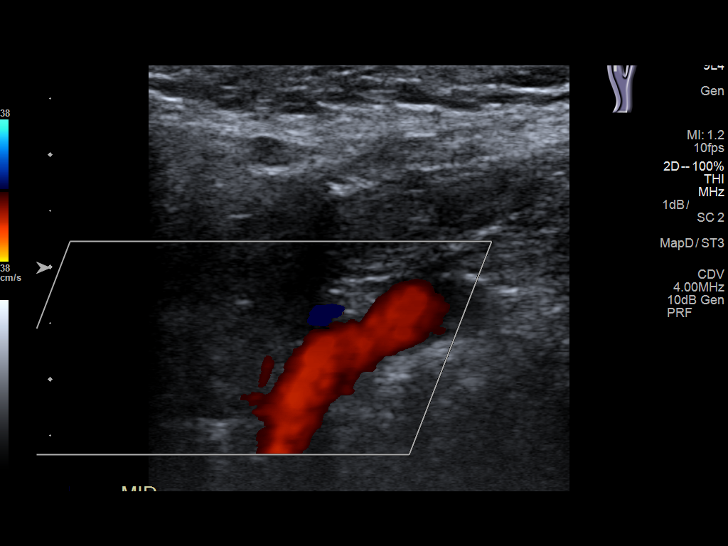
[im 34/65]
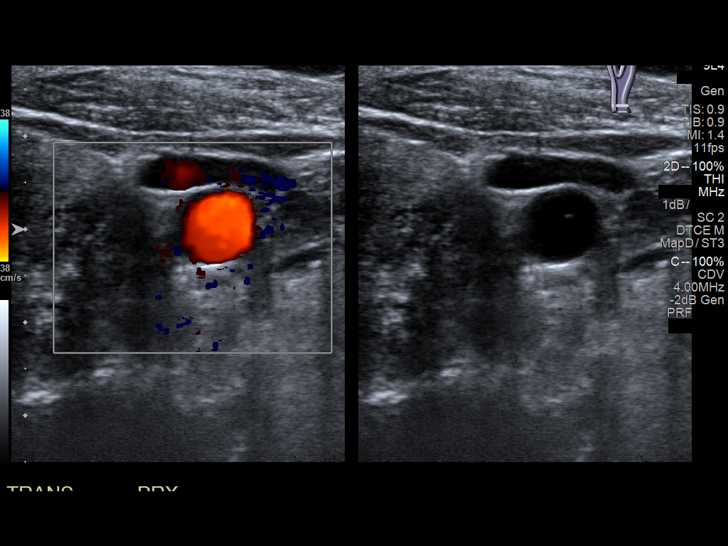
[im 37/65]
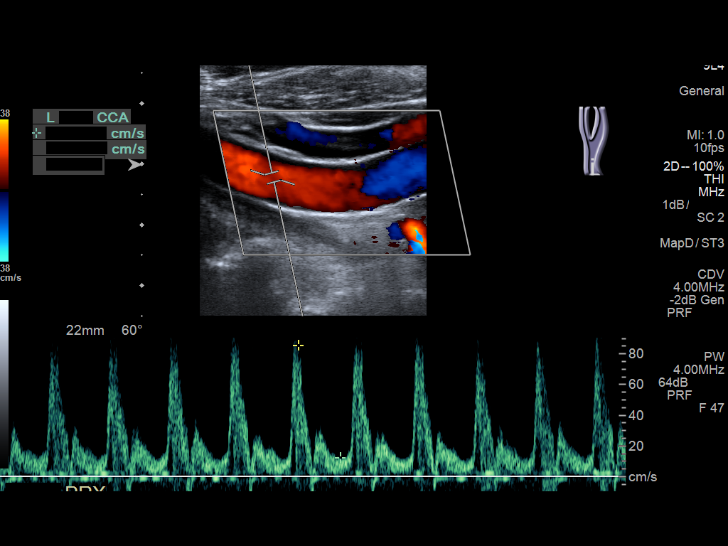
[im 42/65]
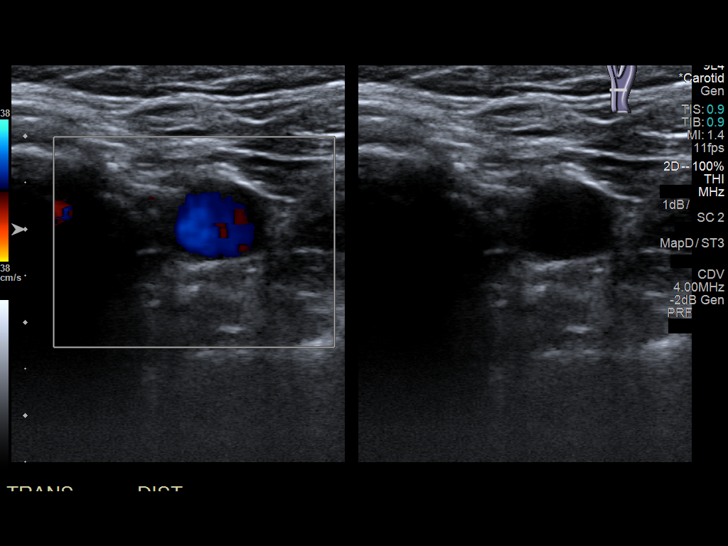
[im 48/65]
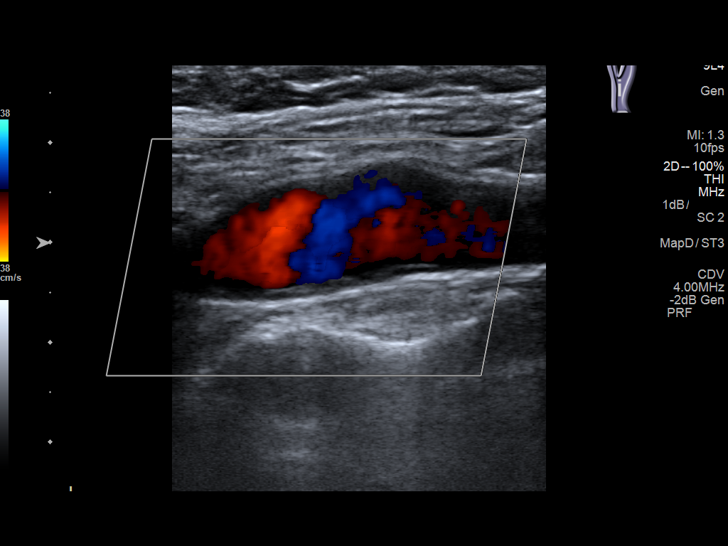
[im 53/65]
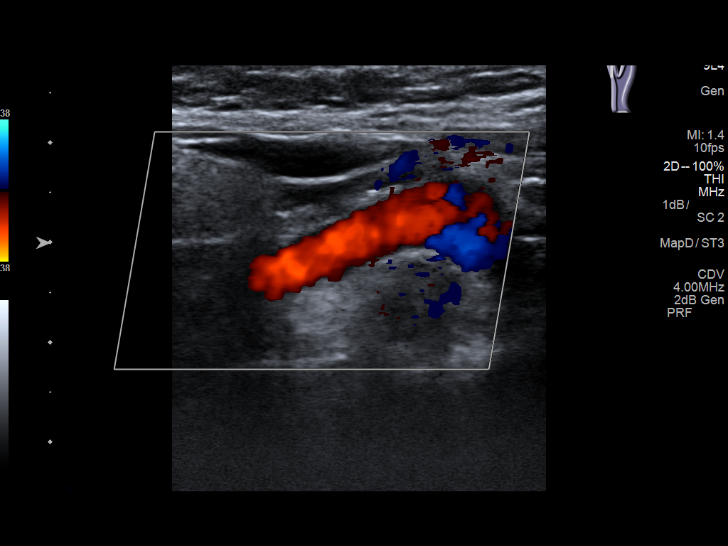
[im 59/65]
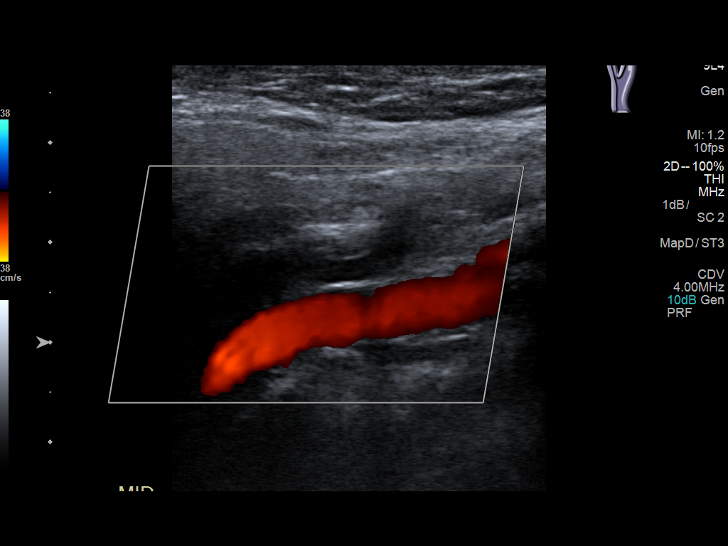
[im 65/65]
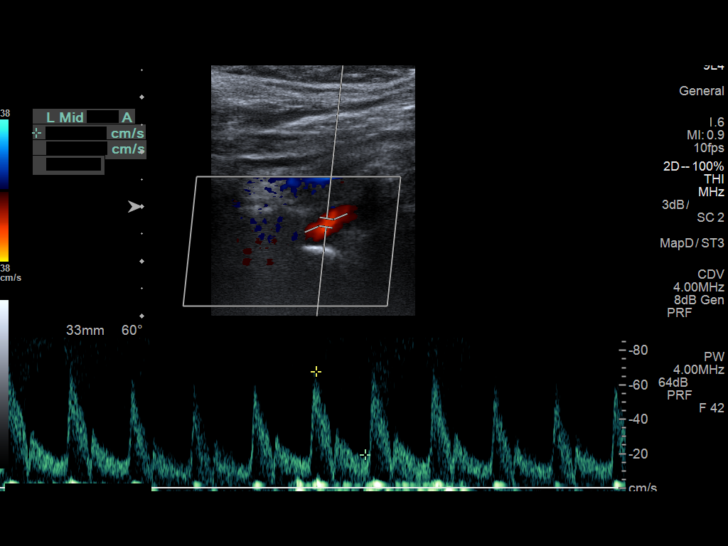

[13 of 24 positions shown; findings below may reference images not displayed]

FINDINGS: Criteria: Quantification of carotid stenosis is based on velocity
parameters that correlate the residual internal carotid diameter
with NASCET-based stenosis levels, using the diameter of the distal
internal carotid lumen as the denominator for stenosis measurement.

The following velocity measurements were obtained:

RIGHT

ICA: 81 cm/sec

CCA: 95 cm/sec

SYSTOLIC ICA/CCA RATIO:

ECA: 107 cm/sec

LEFT

ICA: 73 cm/sec

CCA: 86 cm/sec

SYSTOLIC ICA/CCA RATIO:

ECA: 105 cm/sec

RIGHT CAROTID ARTERY: Minimal calcified plaque in the bulb. Low
resistance internal carotid Doppler pattern is preserved. Mild
calcified plaque is present in the lower internal carotid artery.

RIGHT VERTEBRAL ARTERY:  Antegrade.

LEFT CAROTID ARTERY: Little if any plaque in the bulb. Low
resistance internal carotid Doppler pattern is preserved.

LEFT VERTEBRAL ARTERY:  Antegrade.
IMPRESSION: Less than 50% stenosis in the right and left internal carotid
arteries.

## 2020-05-15 ENCOUNTER — Other Ambulatory Visit: Payer: Self-pay | Admitting: Psychiatry

## 2020-05-15 DIAGNOSIS — G4701 Insomnia due to medical condition: Secondary | ICD-10-CM

## 2020-07-12 ENCOUNTER — Telehealth (INDEPENDENT_AMBULATORY_CARE_PROVIDER_SITE_OTHER): Payer: Medicare Other | Admitting: Psychiatry

## 2020-07-12 ENCOUNTER — Other Ambulatory Visit: Payer: Self-pay

## 2020-07-12 ENCOUNTER — Encounter: Payer: Self-pay | Admitting: Psychiatry

## 2020-07-12 DIAGNOSIS — F3342 Major depressive disorder, recurrent, in full remission: Secondary | ICD-10-CM | POA: Insufficient documentation

## 2020-07-12 DIAGNOSIS — G4701 Insomnia due to medical condition: Secondary | ICD-10-CM | POA: Diagnosis not present

## 2020-07-12 MED ORDER — FLUOXETINE HCL 40 MG PO CAPS: 40.0000 mg | ORAL_CAPSULE | Freq: Every day | ORAL | 1 refills | Status: DC

## 2020-07-12 MED ORDER — BUPROPION HCL ER (XL) 150 MG PO TB24: 150.0000 mg | ORAL_TABLET | Freq: Every day | ORAL | 1 refills | Status: DC

## 2020-07-12 NOTE — Progress Notes (Signed)
Virtual Visit via Telephone Note  I connected with Mark Foerster. on 07/12/20 at 10:00 AM EDT by telephone and verified that I am speaking with the correct person using two identifiers.  Location Provider Location : [facility] Patient Location : Home  Participants: Patient ,wife, Provider    I discussed the limitations, risks, security and privacy concerns of performing an evaluation and management service by telephone and the availability of in person appointments. I also discussed with the patient that there may be a patient responsible charge related to this service. The patient expressed understanding and agreed to proceed.   I discussed the assessment and treatment plan with the patient. The patient was provided an opportunity to ask questions and all were answered. The patient agreed with the plan and demonstrated an understanding of the instructions.   The patient was advised to call back or seek an in-person evaluation if the symptoms worsen or if the condition fails to improve as anticipated.   [place] MD OP Progress Note  07/12/2020 9:45 AM Mark Foerster.  MRN:  [id number]  Chief Complaint:  Chief Complaint    Follow-up; Anxiety     HPI: Mark Hufnagle. is a 80 year old Caucasian male, retired, lives in [place], has a history of MDD, insomnia, pneumothorax, history of syncopal episodes, BPH, multiple other medical problems including restless leg syndrome, obstructive sleep apnea, was evaluated by telemedicine today.  Patient as well as wife participated in evaluation today.  Patient today reports he is doing well.  Patient denies any sadness.  Denies anxiety.  Reports sleep is overall okay.  According to wife patient is doing well and is compliant on medications.  He had a follow-up appointment with primary care provider recently and was advised to make lifestyle changes, eat healthy and exercise more regularly  to lose weight.  He did have an elevated blood pressure and  they are trying to manage it by making these lifestyle changes for now.  Denies any other concerns today.    Visit Diagnosis:    ICD-10-CM   1. MDD (major depressive disorder), recurrent, in full remission ([facility] [name])  F33.42   2. Insomnia due to medical condition  G47.01     Past Psychiatric History: I have reviewed past psychiatric history from progress note on 04/20/2018.  Past trials of Prozac, Wellbutrin.  Past Medical History:  Past Medical History:  Diagnosis Date  . Anxiety   . Atrial fibrillation ([place])   . Back pain, chronic   . Cancer ([place])    skin  . Colon polyp   . Cyst of left kidney   . GERD (gastroesophageal reflux disease)   . Heart murmur   . Hyperlipemia   . Mitral valve prolapse   . Neuromuscular disorder ([place])   . Neuropathy   . Osteoarthritis   . Pulmonary emboli ([place])   . Spinal stenosis   . Thyroid nodule   . Vertigo     Past Surgical History:  Procedure Laterality Date  . APPENDECTOMY    . BACK SURGERY    . COLONOSCOPY N/A 08/15/2014   Procedure: COLONOSCOPY;  Surgeon: [name], MD;  Location: [facility] ENDOSCOPY;  Service: Endoscopy;  Laterality: N/A;  . ESOPHAGOGASTRODUODENOSCOPY N/A 08/15/2014   Procedure: ESOPHAGOGASTRODUODENOSCOPY (EGD);  Surgeon: [name], MD;  Location: [facility] ENDOSCOPY;  Service: Endoscopy;  Laterality: N/A;  . TONSILLECTOMY      Family Psychiatric History: I have reviewed family psychiatric history from progress note on  04/20/2018.   Family History:  Family History  Problem Relation Age of Onset  . Stroke Mother   . COPD Father     Social History: I have reviewed social history from progress note on 04/20/2018 Social History   Socioeconomic History  . Marital status: Married    Spouse name: gail  . Number of children: 2  . Years of education: Not on file  . Highest education level: Master's degree (e.g., MA, MS, MEng, MEd, MSW, MBA)  Occupational History  . Not on file  Tobacco Use  . Smoking status:  Former Smoker    Quit date: 04/20/1958    Years since quitting: 62.2  . Smokeless tobacco: Never Used  Vaping Use  . Vaping Use: Never used  Substance and Sexual Activity  . Alcohol use: Yes    Alcohol/week: 1.0 standard drink    Types: 1 Glasses of wine per week  . Drug use: Never  . Sexual activity: Not Currently  Other Topics Concern  . Not on file  Social History Narrative  . Not on file   Social Determinants of Health   Financial Resource Strain: Not on file  Food Insecurity: Not on file  Transportation Needs: Not on file  Physical Activity: Not on file  Stress: Not on file  Social Connections: Not on file    Allergies:  Allergies  Allergen Reactions  . Ciprofloxacin Other (See Comments)  . Erythromycin Swelling  . Keflex [Cephalexin] Swelling  . Penicillins Swelling    Has patient had a PCN reaction causing immediate rash, facial/tongue/throat swelling, SOB or lightheadedness with hypotension: Yes Has patient had a PCN reaction causing severe rash involving mucus membranes or skin necrosis: No Has patient had a PCN reaction that required hospitalization: Unknown Has patient had a PCN reaction occurring within the last 10 years: No If all of the above answers are "NO", then may proceed with Cephalosporin use.     Metabolic Disorder Labs: No results found for: HGBA1C, MPG No results found for: PROLACTIN No results found for: CHOL, TRIG, HDL, CHOLHDL, VLDL, LDLCALC Lab Results  Component Value Date   TSH 0.866 02/21/2018    Therapeutic Level Labs: No results found for: LITHIUM No results found for: VALPROATE No components found for:  CBMZ  Current Medications: Current Outpatient Medications  Medication Sig Dispense Refill  . apixaban (ELIQUIS) 2.5 MG TABS tablet Take 2.5 mg by mouth 2 (two) times daily.    . B Complex Vitamins (VITAMIN B COMPLEX) TABS Take by mouth.    Marland Kitchen buPROPion (WELLBUTRIN XL) 150 MG 24 hr tablet Take 1 tablet (150 mg total) by mouth  daily. 90 tablet 1  . clotrimazole-betamethasone (LOTRISONE) cream Apply 1 application topically 2 (two) times daily.     . Coenzyme Q10 (CO Q 10 PO) Take 1 Dose by mouth daily.     . Cranberry (ELLURA) 200 MG CAPS Take 200 mg by mouth daily.    . Cyanocobalamin (B-12) 5000 MCG CAPS Take 5,000 mg by mouth daily. (Patient taking differently: Take 5,000 mcg by mouth daily. ) 30 capsule 0  . esomeprazole (NEXIUM) 40 MG capsule Take 40 mg by mouth daily.    . finasteride (PROSCAR) 5 MG tablet Take 5 mg by mouth daily.     Marland Kitchen FLUoxetine (PROZAC) 20 MG capsule TAKE 1 CAPSULE BY MOUTH ONCE DAILY. TO BE COMBINED WITH 40MG . 90 capsule 1  . FLUoxetine (PROZAC) 40 MG capsule Take 1 capsule (40 mg total) by mouth  daily. To be combined with 20 mg 90 capsule 1  . nystatin-triamcinolone ointment (MYCOLOG) APPLY TO AFFECTED AREAS TWICE DAILY 30 g 1  . simvastatin (ZOCOR) 40 MG tablet Take 40 mg by mouth at bedtime.     . tamsulosin (FLOMAX) 0.4 MG CAPS capsule Take 0.4 mg by mouth daily.     . traMADol (ULTRAM) 50 MG tablet Take 1 tablet (50 mg total) by mouth every 6 (six) hours as needed. 20 tablet 0  . VITAMIN D PO Take by mouth daily.     Current Facility-Administered Medications  Medication Dose Route Frequency Provider Last Rate Last Admin  . nystatin (MYCOSTATIN/NYSTOP) topical powder   Topical BID Hollice Espy, MD         Musculoskeletal: Strength & Muscle Tone: UTA Gait & Station: UTA Patient leans: N/A  Psychiatric Specialty Exam: Review of Systems  Psychiatric/Behavioral: Positive for sleep disturbance.  All other systems reviewed and are negative.   There were no vitals taken for this visit.There is no height or weight on file to calculate BMI.  General Appearance: UTA  Eye Contact:  UTA  Speech:  Clear and Coherent  Volume:  Normal  Mood:  Euthymic  Affect:  UTA  Thought Process:  Goal Directed and Descriptions of Associations: Intact  Orientation:  Other:  month,day, year,  place, person  Thought Content: Logical   Suicidal Thoughts:  No  Homicidal Thoughts:  No  Memory:  Immediate;   Fair Recent;   Fair Remote;   Fair  Judgement:  Fair  Insight:  Fair  Psychomotor Activity:  UTA  Concentration:  Concentration: Fair and Attention Span: Fair  Recall:  [employer] of Knowledge: Fair  Language: Fair  Akathisia:  No  Handed:  Right  AIMS (if indicated): UTA  Assets:  Communication Skills Desire for Improvement Housing Social Support  ADL's:  Intact  Cognition: WNL  Sleep:  Restless due to urination , but overall OK   Screenings:   Assessment and Plan: Mukhtar Shams. is a 80 year old Caucasian male, lives in [place], has a history of depression, BPH, history of falls, syncopal episodes, basal cell carcinoma, hyperlipidemia was evaluated by telemedicine today.  Patient is currently stable  Plan MDD in full remission Wellbutrin extended release 150 mg p.o. daily Prozac 60 mg p.o. daily  Insomnia- stable Patient to work on sleep hygiene techniques. Noncompliance with CPAP since he did not tolerate it.  Reviewed notes per Dr. Ginette Pitman dated 06/19/2020-primary care provider- patient with weakness of both lower extremity, lumbar stenosis, spinal stenosis of the lumbosacral region, gastroesophageal reflux disease, anxiety, paroxysmal atrial fibrillation, history of PE, chronic anticoagulation, elevated hemoglobin A1c-BP- elevated at 152/84, pulse-77 Chronic back pain- not a candidate for surgery.  Bipedal edema-likely venous stasis-recommend elevation and compression stockings.  GERD-on Nexium.  Low vitamin B12-on supplement.  Hyperlipidemia-on simvastatin.  BPH-on finasteride and Flomax.  OSA-did not tolerate CPAP.  Patient is waiting for Colocort test- status post colonoscopy in 20 10-2 polyps.  Collateral information obtained from wife as noted above.  Follow-up in clinic in 6 months or sooner if needed in person   I have spent at least 11 minutes  non face to face with patient today .  This note was generated in part or whole with voice recognition software. Voice recognition is usually quite accurate but there are transcription errors that can and very often do occur. I apologize for any typographical errors that were not detected and corrected.  Ursula Alert, MD 07/12/2020, 9:45 AM

## 2020-07-17 LAB — COLOGUARD: COLOGUARD: NEGATIVE

## 2020-10-13 ENCOUNTER — Emergency Department: Payer: Medicare Other

## 2020-10-13 ENCOUNTER — Other Ambulatory Visit: Payer: Self-pay

## 2020-10-13 ENCOUNTER — Emergency Department
Admission: EM | Admit: 2020-10-13 | Discharge: 2020-10-13 | Disposition: A | Discharge status: Home / Self Care | Payer: Medicare Other | Attending: Emergency Medicine | Admitting: Emergency Medicine

## 2020-10-13 DIAGNOSIS — Z87891 Personal history of nicotine dependence: Secondary | ICD-10-CM | POA: Insufficient documentation

## 2020-10-13 DIAGNOSIS — Z86718 Personal history of other venous thrombosis and embolism: Secondary | ICD-10-CM | POA: Diagnosis not present

## 2020-10-13 DIAGNOSIS — Z85828 Personal history of other malignant neoplasm of skin: Secondary | ICD-10-CM | POA: Insufficient documentation

## 2020-10-13 DIAGNOSIS — W06XXXA Fall from bed, initial encounter: Secondary | ICD-10-CM | POA: Insufficient documentation

## 2020-10-13 DIAGNOSIS — U071 COVID-19: Secondary | ICD-10-CM

## 2020-10-13 DIAGNOSIS — R0602 Shortness of breath: Secondary | ICD-10-CM | POA: Insufficient documentation

## 2020-10-13 DIAGNOSIS — Z7901 Long term (current) use of anticoagulants: Secondary | ICD-10-CM | POA: Insufficient documentation

## 2020-10-13 DIAGNOSIS — S0181XA Laceration without foreign body of other part of head, initial encounter: Secondary | ICD-10-CM | POA: Diagnosis not present

## 2020-10-13 DIAGNOSIS — S81812A Laceration without foreign body, left lower leg, initial encounter: Secondary | ICD-10-CM | POA: Diagnosis not present

## 2020-10-13 DIAGNOSIS — S81811A Laceration without foreign body, right lower leg, initial encounter: Secondary | ICD-10-CM | POA: Diagnosis not present

## 2020-10-13 DIAGNOSIS — S51812A Laceration without foreign body of left forearm, initial encounter: Secondary | ICD-10-CM | POA: Diagnosis not present

## 2020-10-13 DIAGNOSIS — Y92009 Unspecified place in unspecified non-institutional (private) residence as the place of occurrence of the external cause: Secondary | ICD-10-CM | POA: Insufficient documentation

## 2020-10-13 DIAGNOSIS — S0990XA Unspecified injury of head, initial encounter: Secondary | ICD-10-CM

## 2020-10-13 DIAGNOSIS — R2689 Other abnormalities of gait and mobility: Secondary | ICD-10-CM

## 2020-10-13 LAB — BASIC METABOLIC PANEL
Anion gap: 8 (ref 5–15)
BUN: 15 mg/dL (ref 8–23)
CO2: 24 mmol/L (ref 22–32)
Calcium: 9.2 mg/dL (ref 8.9–10.3)
Chloride: 105 mmol/L (ref 98–111)
Creatinine, Ser: 1.1 mg/dL (ref 0.61–1.24)
GFR, Estimated: >60 mL/min (ref >60)
Glucose, Bld: 127 mg/dL — ABNORMAL HIGH (ref 70–99)
Potassium: 3.9 mmol/L (ref 3.5–5.1)
Sodium: 137 mmol/L (ref 135–145)

## 2020-10-13 LAB — CBC
HCT: 44.6 % (ref 39.0–52.0)
Hemoglobin: 15.3 g/dL (ref 13.0–17.0)
MCH: 32.1 pg (ref 26.0–34.0)
MCHC: 34.3 g/dL (ref 30.0–36.0)
MCV: 93.5 fL (ref 80.0–100.0)
Platelets: 196 10*3/uL (ref 150–400)
RBC: 4.77 MIL/uL (ref 4.22–5.81)
RDW: 13.1 % (ref 11.5–15.5)
WBC: 6.7 10*3/uL (ref 4.0–10.5)
nRBC: 0 % (ref 0.0–0.2)

## 2020-10-13 LAB — BRAIN NATRIURETIC PEPTIDE: B Natriuretic Peptide: 87.9 pg/mL (ref 0.0–100.0)

## 2020-10-13 LAB — RESP PANEL BY RT-PCR (FLU A&B, COVID) ARPGX2
Influenza A by PCR: NEGATIVE
Influenza B by PCR: NEGATIVE
SARS Coronavirus 2 by RT PCR: POSITIVE — AB

## 2020-10-13 LAB — PROCALCITONIN: Procalcitonin: <0.1 ng/mL

## 2020-10-13 LAB — TROPONIN I (HIGH SENSITIVITY): Troponin I (High Sensitivity): 15 ng/L (ref <18)

## 2020-10-13 MED ORDER — ONDANSETRON HCL 4 MG PO TABS: 4.0000 mg | ORAL_TABLET | Freq: Three times a day (TID) | ORAL | 0 refills | Status: DC | PRN

## 2020-10-13 NOTE — ED Provider Notes (Signed)
[facility] Emergency Department Provider Note  ____________________________________________   Event Date/Time   First MD Initiated Contact with Patient 10/13/20 1024     (approximate)  I have reviewed the triage vital signs and the nursing notes.   HISTORY  Chief Complaint Fall   HPI Mark [name]. is a 80 y.o. male with a past medical history of A. fib and and factor V Leiden mutation with history of DVT on Eliquis without any recently missed doses, spinal stenosis, HDL, chronic back pain, anxiety, chronic balance issues with a fall about a month ago presents accompanied by his son after he fell earlier this morning when getting out of bed.  Patient states he typically has a walker at all times anything his walker got out from under him getting out of bed and he fell against a nightstand.  He states he hit the right side of his head and scraped his left forearm and both lower legs.  He did not have any LOC and states he currently has no pain.  He does note his wife currently has COVID.  States he has had a little nausea over the last day or so but has not had any cough, shortness of breath, chest pain, abdominal pain, acute back pain, vomiting, diarrhea, dysuria, rash or other fall in the last couple of days.  No other acute concerns at this time.  He feels that his balance issues are not any different today than it had been the last several months.         Past Medical History:  Diagnosis Date   Anxiety    Atrial fibrillation (HCC)    Back pain, chronic    Cancer (HCC)    skin   Colon polyp    Cyst of left kidney    GERD (gastroesophageal reflux disease)    Heart murmur    Hyperlipemia    Mitral valve prolapse    Neuromuscular disorder (HCC)    Neuropathy    Osteoarthritis    Pulmonary emboli (HCC)    Spinal stenosis    Thyroid nodule    Vertigo     Patient Active Problem List   Diagnosis Date Noted   MDD (major depressive disorder),  recurrent, in full remission ([place]) 07/12/2020   MDD (major depressive disorder), recurrent episode, mild ([place]) 11/04/2018   Insomnia due to medical condition 11/04/2018   Fall 03/02/2018   Pleural effusion    Weakness 02/22/2018   Nausea 02/21/2018   Near syncope 10/24/2017   Orthostatic hypotension 10/24/2017   History of pulmonary embolus (PE) 08/12/2017   PAF (paroxysmal atrial fibrillation) ([place]) 08/12/2017   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   Abnormal cardiovascular function study 01/07/2017   Lightheadedness 12/23/2016   Lower extremity numbness 12/23/2016   Orthostatic dizziness 12/23/2016   Weakness of both lower extremities 12/23/2016   Arthritis 11/17/2016   SOB (shortness of breath) 11/16/2016   DDD (degenerative disc disease), lumbar 07/01/2016   Lumbosacral spondylosis without myelopathy 01/07/2016   Chronic bilateral low back pain without sciatica 10/10/2015   Primary osteoarthritis of right shoulder 06/25/2015   H/O adenomatous polyp of colon 07/20/2014   Chronic anticoagulation 10/04/2012   Anxiety 09/11/2011   Cyst of left kidney 09/11/2011   GERD (gastroesophageal reflux disease) 09/11/2011   Heart murmur 09/11/2011   Hyperlipidemia 09/11/2011   Neuropathy 09/11/2011   Pulmonary embolus ([place]) 09/11/2011   Back pain, chronic 09/11/2011    Past Surgical History:  Procedure Laterality Date   APPENDECTOMY     BACK SURGERY     COLONOSCOPY N/A 08/15/2014   Procedure: COLONOSCOPY;  Surgeon: [name], MD;  Location: [facility] ENDOSCOPY;  Service: Endoscopy;  Laterality: N/A;   ESOPHAGOGASTRODUODENOSCOPY N/A 08/15/2014   Procedure: ESOPHAGOGASTRODUODENOSCOPY (EGD);  Surgeon: [name], MD;  Location: [facility] ENDOSCOPY;  Service: Endoscopy;  Laterality: N/A;   TONSILLECTOMY      Prior to Admission medications   Medication Sig Start Date End Date Taking? Authorizing Provider  ondansetron (ZOFRAN) 4 MG tablet Take 1 tablet (4 mg total)  by mouth every 8 (eight) hours as needed for up to 10 doses for nausea or vomiting. 10/13/20  Yes Lucrezia Starch, MD  apixaban (ELIQUIS) 2.5 MG TABS tablet Take 2.5 mg by mouth 2 (two) times daily.    [provider]  B Complex Vitamins (VITAMIN B COMPLEX) TABS Take by mouth.    [provider]  buPROPion (WELLBUTRIN XL) 150 MG 24 hr tablet Take 1 tablet (150 mg total) by mouth daily. 07/12/20   Ursula Alert, MD  Cholecalciferol 25 MCG (1000 UT) tablet Take by mouth.    [provider]  clotrimazole-betamethasone (LOTRISONE) cream Apply 1 application topically 2 (two) times daily.     [provider]  Coenzyme Q10 (CO Q 10 PO) Take 1 Dose by mouth daily.     [provider]  Cranberry (ELLURA) 200 MG CAPS Take 200 mg by mouth daily.    [provider]  Cyanocobalamin (B-12) 5000 MCG CAPS Take 5,000 mg by mouth daily. Patient taking differently: Take 5,000 mcg by mouth daily.  02/22/18   Saundra Shelling, MD  esomeprazole (NEXIUM) 40 MG capsule Take 40 mg by mouth daily. 03/13/19   [provider]  finasteride (PROSCAR) 5 MG tablet Take 5 mg by mouth daily.  12/01/16   [provider]  FLUoxetine (PROZAC) 20 MG capsule TAKE 1 CAPSULE BY MOUTH ONCE DAILY. TO BE COMBINED WITH '40MG'$ . 05/15/20   Ursula Alert, MD  FLUoxetine (PROZAC) 40 MG capsule Take 1 capsule (40 mg total) by mouth daily. To be combined with 20 mg 07/12/20   Ursula Alert, MD  MOVANTIK 12.5 MG TABS tablet Take 12.5 mg by mouth daily. 03/07/20   [provider]  nystatin-triamcinolone ointment (MYCOLOG) APPLY TO AFFECTED AREAS TWICE DAILY 10/11/19   Zara Council A, PA-C  simvastatin (ZOCOR) 40 MG tablet Take 40 mg by mouth at bedtime.     [provider]  tamsulosin (FLOMAX) 0.4 MG CAPS capsule Take 0.4 mg by mouth daily.     [provider]  traMADol (ULTRAM) 50 MG tablet Take 1 tablet (50 mg total) by mouth every 6 (six) hours as  needed. 02/04/20   Triplett, Cari B, FNP  VITAMIN D PO Take by mouth daily.    [provider]    Allergies Ciprofloxacin, Erythromycin, Keflex [cephalexin], and Penicillins  Family History  Problem Relation Age of Onset   Stroke Mother    COPD Father     Social History Social History   Tobacco Use   Smoking status: Former    Types: Cigarettes    Quit date: 04/20/1958    Years since quitting: 62.5   Smokeless tobacco: Never  Vaping Use   Vaping Use: Never used  Substance Use Topics   Alcohol use: Yes    Alcohol/week: 1.0 standard drink    Types: 1 Glasses of wine per week   Drug  use: Never    Review of Systems  Review of Systems  Constitutional:  Negative for chills and fever.  HENT:  Negative for sore throat.   Eyes:  Negative for pain.  Respiratory:  Negative for cough and stridor.   Cardiovascular:  Negative for chest pain.  Gastrointestinal:  Positive for nausea. Negative for vomiting.  Genitourinary:  Negative for dysuria.  Musculoskeletal:  Positive for back pain (chronic) and falls.  Skin:  Negative for rash.  Neurological:  Negative for seizures, loss of consciousness and headaches.  Psychiatric/Behavioral:  Negative for suicidal ideas.   All other systems reviewed and are negative.    ____________________________________________   PHYSICAL EXAM:  VITAL SIGNS: ED Triage Vitals  Enc Vitals Group     BP 10/13/20 0732 116/65     Pulse Rate 10/13/20 0731 94     Resp 10/13/20 0731 20     Temp 10/13/20 0731 99.5 F (37.5 C)     Temp Source 10/13/20 0731 Oral     SpO2 10/13/20 0731 92 %     Weight 10/13/20 0727 238 lb 15.7 oz (108.4 kg)     Height 10/13/20 0727 '6\' 3"'$  (1.905 m)     Head Circumference --      Peak Flow --      Pain Score --      Pain Loc --      Pain Edu? --      Excl. in [place]? --    Vitals:   10/13/20 1204 10/13/20 1230  BP:  (!) 149/79  Pulse:  87  Resp:  19  Temp:    SpO2: 93% 93%   Physical Exam Vitals and  nursing note reviewed.  Constitutional:      Appearance: He is well-developed.  HENT:     Head: Normocephalic and atraumatic.     Right Ear: External ear normal.     Left Ear: External ear normal.     Nose: Nose normal.  Eyes:     Conjunctiva/sclera: Conjunctivae normal.  Cardiovascular:     Rate and Rhythm: Normal rate and regular rhythm.     Heart sounds: No murmur heard. Pulmonary:     Effort: Pulmonary effort is normal. No respiratory distress.     Breath sounds: Normal breath sounds.  Abdominal:     Palpations: Abdomen is soft.     Tenderness: There is no abdominal tenderness.  Musculoskeletal:     Cervical back: Neck supple.     Right lower leg: Edema present.     Left lower leg: Edema present.  Skin:    General: Skin is warm and dry.     Capillary Refill: Capillary refill takes less than 2 seconds.  Neurological:     Mental Status: He is alert and oriented to person, place, and time.  Psychiatric:        Mood and Affect: Mood normal.    Very superficial lacerations less than 0.5 cm and abrasions over the right face over the right nasal bridge and cheek.  There is also a small superficial skin tear over the left forearm and over the bilateral lower legs just proximal to the ankles.  No tenderness step-offs or deformities over the C/T/L-spine.  Cranial nerves II through XII are grossly intact.  Patient is symmetric strength in his upper and lower extremities.  Sensation is intact light touch all extremities although patient states he has low sensation in his lower legs and light touch.  2+ bilateral radial and  DP pulses.  No tenderness deformities or effusion of the bilateral wrists, elbows, shoulders, hips, knees or ankles.  Patient unable to ambulate without significant assistance. ____________________________________________   LABS (all labs ordered are listed, but only abnormal results are displayed)  Labs Reviewed  RESP PANEL BY RT-PCR (FLU A&B, COVID) ARPGX2 -  Abnormal; Notable for the following components:      Result Value   SARS Coronavirus 2 by RT PCR POSITIVE (*)    All other components within normal limits  BASIC METABOLIC PANEL - Abnormal; Notable for the following components:   Glucose, Bld 127 (*)    All other components within normal limits  CBC  PROCALCITONIN  BRAIN NATRIURETIC PEPTIDE  TROPONIN I (HIGH SENSITIVITY)  TROPONIN I (HIGH SENSITIVITY)   ____________________________________________  EKG  Sinus rhythm with ventricular rate 89, prolonged interval with first-degree block at 220 with otherwise unremarkable intervals and some nonspecific change versus artifact in inferior leads without other clear evidence of acute ischemia or significant arrhythmia. ____________________________________________  RADIOLOGY  ED MD interpretation: CT head shows no acute intracranial hemorrhage i.e. skull fracture or other clear acute injury.  Generalized orbital volume loss and chronic small vessel white matter disease.  Bilateral parietal mastoid effusions.  CT C-spine shows no evidence of acute spine fracture.  There is some degenerative changes throughout.  Chest x-ray shows no focal consolidation, large effusion, overt edema, pneumothorax or other clear acute intrathoracic process.  Official radiology report(s): DG Chest 2 View  Result Date: 10/13/2020 CLINICAL DATA:  Fall, low oxygen level. EXAM: CHEST - 2 VIEW COMPARISON:  Chest radiograph dated 05/06/2018. FINDINGS: The heart size and mediastinal contours are within normal limits. There is mild left basilar atelectasis/airspace disease. The right lung is clear. There is no pleural effusion or pneumothorax. Degenerative changes are seen in the spine. IMPRESSION: Mild left basilar atelectasis/airspace disease. Electronically Signed   By: Zerita Boers M.D.   On: 10/13/2020 10:31   CT Head Wo Contrast  Result Date: 10/13/2020 CLINICAL DATA:  Mechanical fall. Head trauma, coagulopathy.  Anticoagulated on Eliquis. Tachycardia. EXAM: CT HEAD WITHOUT CONTRAST TECHNIQUE: Contiguous axial images were obtained from the base of the skull through the vertex without intravenous contrast. COMPARISON:  10/06/2009 head CT. FINDINGS: Brain: Nonspecific moderate subcortical and periventricular white matter hypodensity, most in keeping with chronic small vessel ischemic change. No evidence of parenchymal hemorrhage or extra-axial fluid collection. No mass lesion, mass effect, or midline shift. No CT evidence of acute infarction. Generalized cerebral volume loss. No ventriculomegaly. Vascular: No acute abnormality. Skull: No evidence of calvarial fracture. Sinuses/Orbits: The visualized paranasal sinuses are essentially clear. Other: Small partial bilateral mastoid effusions. IMPRESSION: 1. No evidence of acute intracranial abnormality. No evidence of calvarial fracture. 2. Generalized cerebral volume loss and moderate chronic small vessel ischemic changes in the cerebral white matter. 3. Small partial bilateral mastoid effusions. Electronically Signed   By: Ilona Sorrel M.D.   On: 10/13/2020 08:33   CT Cervical Spine Wo Contrast  Result Date: 10/13/2020 CLINICAL DATA:  Neck trauma.  Neck pain.  Status post fall. EXAM: CT CERVICAL SPINE WITHOUT CONTRAST TECHNIQUE: Multidetector CT imaging of the cervical spine was performed without intravenous contrast. Multiplanar CT image reconstructions were also generated. COMPARISON:  None. FINDINGS: Alignment: 3 mm anterolisthesis of C3 on 4 likely secondary to the advanced bilateral facet disease at the same level. Straightening of normal cervical lordosis evident. Skull base and vertebrae: No acute fracture. No primary bone lesion or focal pathologic  process. Soft tissues and spinal canal: No prevertebral fluid or swelling. No visible canal hematoma. Disc levels: Marked loss of disc height at C4-5 with advanced degenerative disc disease at C5-6 and C6-7. Diffuse facet  osteoarthritis noted bilaterally, left greater than right. Upper chest: Unremarkable. Other: None. IMPRESSION: 1. No evidence for cervical spine fracture. 2. Loss of cervical lordosis. This can be related to patient positioning, muscle spasm or soft tissue injury. 3. Advanced degenerative disc disease at C4-5, C5-6 and C6-7. Electronically Signed   By: Misty Stanley M.D.   On: 10/13/2020 10:46    ____________________________________________   PROCEDURES  Procedure(s) performed (including Critical Care):  Procedures   ____________________________________________   INITIAL IMPRESSION / ASSESSMENT AND PLAN / ED COURSE      Patient presents with above-stated history exam for assessment after a fall.  On arrival he is afebrile and hemodynamically stable with borderline low SPO2 at 92%.  Endorsing some nausea he denies any other recent sick symptoms and endorses chronic balance issues with a fall about a month ago.  Suspect likely fall today due to problems with his balance as he seems to require fair amount of assistance at the bedside.  Given he is anticoagulated CT head and C-spine obtained. CT head shows no acute intracranial hemorrhage i.e. skull fracture or other clear acute injury.  Generalized orbital volume loss and chronic small vessel white matter disease.  Bilateral parietal mastoid effusions.  CT C-spine shows no evidence of acute spine fracture.  There is some degenerative changes throughout.    Neck likely very superficial lacerations abrasions over the face and some skin tears over the forearm and into her lower legs.  Low suspicion for occult orthopedic injury or significant visceral injury.  Given borderline low O2 with reported positive COVID exposure chest x-ray obtained.  Chest x-ray shows no focal consolidation, large effusion, overt edema, pneumothorax or other clear acute intrathoracic process.  Patient is noted to be COVID-positive.  BNP chest x-ray shows no evidence  of acute volume overload.  Procalcitonin is undetectable and Evalose patient for bacterial pneumonia.  ECG and nonelevated troponin are not suggestive of ACS or myocarditis.  CBC shows no leukocytosis or acute anemia.  BMP shows no significant electrode or metabolic derangements.   On trial of ambulation patient was able to maintain his SPO2 tween 93 to 94%.  He stated he is not having shortness of breath chest pain dizziness or associated symptoms during the trial.  Further discussion with him and his son it seems he has had balance issues going back for many years they are not any new today in the prefer him to be discharged home with plan for outpatient follow-up.  I think this is reasonable I did offer observation in the hospital given borderline SPO2 and new COVID diagnosis.  Patient states he strongly wished to go home.  Given he was able to maintain at least 93% on ambulation without other clear evidence of immediate life-threatening process patient denying any symptoms I think this is reasonable.  Advised him to continue taking all his medications.  Advise close outpatient PCP follow-up.  Discharged stable condition.  Strict return precautions advised and discussed.        ____________________________________________   FINAL CLINICAL IMPRESSION(S) / ED DIAGNOSES  Final diagnoses:  Injury of head, initial encounter  COVID  Balance problem    Medications - No data to display   ED Discharge Orders          Ordered  ondansetron (ZOFRAN) 4 MG tablet  Every 8 hours PRN        10/13/20 1312             Note:  This document was prepared using Dragon voice recognition software and may include unintentional dictation errors.    Lucrezia Starch, MD 10/13/20 1328

## 2020-10-13 NOTE — ED Notes (Signed)
Ambulated patient per MD order. Pt tolerated well. No SHOB noted upon ambulation. Pt denies dizziness. Oxygen ranged between 93-94% during ambulation on room air. Pt c/o balance issues, but states this is normal for him. Md made aware of findings.

## 2020-10-13 NOTE — ED Triage Notes (Signed)
Pt comes into the ED via [facility] from home c/o mechanical fall.  Pt did hit his head and is on Eliquis.  Pt has been around his wife at home who is COVID positive and patient presents tachy with EMS with saturation level at 95% RA.  18g R. Hand.  150/80, EKG unremarkable. '4mg'$  zofran given.

## 2020-10-13 NOTE — ED Notes (Signed)
Critical lab value received from [facility] that pt is COVID +, informed Dr. Tamala Julian

## 2020-10-14 DIAGNOSIS — U071 COVID-19: Secondary | ICD-10-CM | POA: Insufficient documentation

## 2020-10-31 ENCOUNTER — Other Ambulatory Visit: Payer: Self-pay | Admitting: Psychiatry

## 2020-10-31 DIAGNOSIS — G4701 Insomnia due to medical condition: Secondary | ICD-10-CM

## 2020-12-01 ENCOUNTER — Emergency Department
Admission: EM | Admit: 2020-12-01 | Discharge: 2020-12-01 | Disposition: A | Discharge status: Home / Self Care | Payer: Medicare Other | Attending: Emergency Medicine | Admitting: Emergency Medicine

## 2020-12-01 ENCOUNTER — Other Ambulatory Visit: Payer: Self-pay

## 2020-12-01 ENCOUNTER — Emergency Department: Payer: Medicare Other

## 2020-12-01 DIAGNOSIS — Z85828 Personal history of other malignant neoplasm of skin: Secondary | ICD-10-CM | POA: Diagnosis not present

## 2020-12-01 DIAGNOSIS — I480 Paroxysmal atrial fibrillation: Secondary | ICD-10-CM | POA: Insufficient documentation

## 2020-12-01 DIAGNOSIS — R42 Dizziness and giddiness: Secondary | ICD-10-CM

## 2020-12-01 DIAGNOSIS — Z7901 Long term (current) use of anticoagulants: Secondary | ICD-10-CM | POA: Insufficient documentation

## 2020-12-01 DIAGNOSIS — Z87891 Personal history of nicotine dependence: Secondary | ICD-10-CM | POA: Insufficient documentation

## 2020-12-01 LAB — BASIC METABOLIC PANEL
Anion gap: 10 (ref 5–15)
BUN: 13 mg/dL (ref 8–23)
CO2: 25 mmol/L (ref 22–32)
Calcium: 9.3 mg/dL (ref 8.9–10.3)
Chloride: 104 mmol/L (ref 98–111)
Creatinine, Ser: 1.04 mg/dL (ref 0.61–1.24)
GFR, Estimated: >60 mL/min (ref >60)
Glucose, Bld: 100 mg/dL — ABNORMAL HIGH (ref 70–99)
Potassium: 4.1 mmol/L (ref 3.5–5.1)
Sodium: 139 mmol/L (ref 135–145)

## 2020-12-01 LAB — CBC
HCT: 44 % (ref 39.0–52.0)
Hemoglobin: 15.1 g/dL (ref 13.0–17.0)
MCH: 31.7 pg (ref 26.0–34.0)
MCHC: 34.3 g/dL (ref 30.0–36.0)
MCV: 92.2 fL (ref 80.0–100.0)
Platelets: 236 10*3/uL (ref 150–400)
RBC: 4.77 MIL/uL (ref 4.22–5.81)
RDW: 12.8 % (ref 11.5–15.5)
WBC: 7.7 10*3/uL (ref 4.0–10.5)
nRBC: 0 % (ref 0.0–0.2)

## 2020-12-01 LAB — URINALYSIS, COMPLETE (UACMP) WITH MICROSCOPIC
Bilirubin Urine: NEGATIVE
Glucose, UA: NEGATIVE mg/dL
Hgb urine dipstick: NEGATIVE
Ketones, ur: NEGATIVE mg/dL
Nitrite: NEGATIVE
Protein, ur: NEGATIVE mg/dL
Specific Gravity, Urine: 1.02 (ref 1.005–1.030)
pH: 8 (ref 5.0–8.0)

## 2020-12-01 LAB — TROPONIN I (HIGH SENSITIVITY): Troponin I (High Sensitivity): 11 ng/L (ref <18)

## 2020-12-01 MED ORDER — MECLIZINE HCL 25 MG PO TABS: 25.0000 mg | ORAL_TABLET | Freq: Three times a day (TID) | ORAL | 0 refills | Status: DC | PRN

## 2020-12-01 NOTE — ED Notes (Signed)
Assisted patient to bedside to collect urine specien

## 2020-12-01 NOTE — ED Notes (Signed)
Pt aware urine specimen ordered. Pt reports inability to provide specimen at this time. Specimen collection device provided to patient. 

## 2020-12-01 NOTE — ED Provider Notes (Signed)
[facility] Emergency Department Provider Note  Time seen: 1:15 PM  I have reviewed the triage vital signs and the nursing notes.   HISTORY  Chief Complaint Dizziness (3d)   HPI Mark Klaus. is a 80 y.o. male with a past medical history of anxiety, atrial fibrillation on Eliquis, gastric reflux, hyperlipidemia, presents to the emergency department for dizziness.  According to the patient for the past 3 days he has been experiencing episodes of dizziness which she describes as a spinning sensation accompanied by a lightheaded sensation.  States it mostly occurs upon changes of position like right when he stands up or right when he lies down but states after lying still or standing still oftentimes the feeling goes away.  States symptoms appear to be worse this morning so he came to the emergency department for evaluation.  Denies any fever cough.  No chest pain or abdominal pain vomiting or diarrhea.  No dysuria.   Past Medical History:  Diagnosis Date   Anxiety    Atrial fibrillation (HCC)    Back pain, chronic    Cancer (HCC)    skin   Colon polyp    Cyst of left kidney    GERD (gastroesophageal reflux disease)    Heart murmur    Hyperlipemia    Mitral valve prolapse    Neuromuscular disorder (HCC)    Neuropathy    Osteoarthritis    Pulmonary emboli ([place])    Spinal stenosis    Thyroid nodule    Vertigo     Patient Active Problem List   Diagnosis Date Noted   MDD (major depressive disorder), recurrent, in full remission ([place]) 07/12/2020   MDD (major depressive disorder), recurrent episode, mild ([place]) 11/04/2018   Insomnia due to medical condition 11/04/2018   Fall 03/02/2018   Pleural effusion    Weakness 02/22/2018   Nausea 02/21/2018   Near syncope 10/24/2017   Orthostatic hypotension 10/24/2017   History of pulmonary embolus (PE) 08/12/2017   PAF (paroxysmal atrial fibrillation) ([place]) 08/12/2017   Lumbar stenosis with neurogenic  claudication 08/12/2017   Abnormal cardiovascular function study 01/07/2017   Lightheadedness 12/23/2016   Lower extremity numbness 12/23/2016   Orthostatic dizziness 12/23/2016   Weakness of both lower extremities 12/23/2016   Arthritis 11/17/2016   SOB (shortness of breath) 11/16/2016   DDD (degenerative disc disease), lumbar 07/01/2016   Lumbosacral spondylosis without myelopathy 01/07/2016   Chronic bilateral low back pain without sciatica 10/10/2015   Primary osteoarthritis of right shoulder 06/25/2015   H/O adenomatous polyp of colon 07/20/2014   Chronic anticoagulation 10/04/2012   Anxiety 09/11/2011   Cyst of left kidney 09/11/2011   GERD (gastroesophageal reflux disease) 09/11/2011   Heart murmur 09/11/2011   Hyperlipidemia 09/11/2011   Neuropathy 09/11/2011   Pulmonary embolus ([place]) 09/11/2011   Back pain, chronic 09/11/2011    Past Surgical History:  Procedure Laterality Date   APPENDECTOMY     BACK SURGERY     COLONOSCOPY N/A 08/15/2014   Procedure: COLONOSCOPY;  Surgeon: [name], MD;  Location: [facility] ENDOSCOPY;  Service: Endoscopy;  Laterality: N/A;   ESOPHAGOGASTRODUODENOSCOPY N/A 08/15/2014   Procedure: ESOPHAGOGASTRODUODENOSCOPY (EGD);  Surgeon: [name], MD;  Location: [facility] ENDOSCOPY;  Service: Endoscopy;  Laterality: N/A;   TONSILLECTOMY      Prior to Admission medications   Medication Sig Start Date End Date Taking? Authorizing Provider  apixaban (ELIQUIS) 2.5 MG TABS tablet Take 2.5 mg by mouth 2 (two) times  daily.    [provider]  B Complex Vitamins (VITAMIN B COMPLEX) TABS Take by mouth.    [provider]  buPROPion (WELLBUTRIN XL) 150 MG 24 hr tablet Take 1 tablet (150 mg total) by mouth daily. 07/12/20   Ursula Alert, MD  Cholecalciferol 25 MCG (1000 UT) tablet Take by mouth.    [provider]  clotrimazole-betamethasone (LOTRISONE) cream Apply 1 application topically 2 (two) times daily.     [provider]  Coenzyme Q10 (CO Q 10 PO) Take 1 Dose by mouth daily.     [provider]  Cranberry (ELLURA) 200 MG CAPS Take 200 mg by mouth daily.    [provider]  Cyanocobalamin (B-12) 5000 MCG CAPS Take 5,000 mg by mouth daily. Patient taking differently: Take 5,000 mcg by mouth daily.  02/22/18   Saundra Shelling, MD  esomeprazole (NEXIUM) 40 MG capsule Take 40 mg by mouth daily. 03/13/19   [provider]  finasteride (PROSCAR) 5 MG tablet Take 5 mg by mouth daily.  12/01/16   [provider]  FLUoxetine (PROZAC) 20 MG capsule TAKE 1 CAPSULE BY MOUTH ONCE DAILY. TO BE COMBINED WITH '40MG'$ . 10/31/20   Ursula Alert, MD  FLUoxetine (PROZAC) 40 MG capsule Take 1 capsule (40 mg total) by mouth daily. To be combined with 20 mg 07/12/20   Ursula Alert, MD  MOVANTIK 12.5 MG TABS tablet Take 12.5 mg by mouth daily. 03/07/20   [provider]  nystatin-triamcinolone ointment (MYCOLOG) APPLY TO AFFECTED AREAS TWICE DAILY 10/11/19   Zara Council A, PA-C  ondansetron (ZOFRAN) 4 MG tablet Take 1 tablet (4 mg total) by mouth every 8 (eight) hours as needed for up to 10 doses for nausea or vomiting. 10/13/20   Lucrezia Starch, MD  simvastatin (ZOCOR) 40 MG tablet Take 40 mg by mouth at bedtime.     [provider]  tamsulosin (FLOMAX) 0.4 MG CAPS capsule Take 0.4 mg by mouth daily.     [provider]  traMADol (ULTRAM) 50 MG tablet Take 1 tablet (50 mg total) by mouth every 6 (six) hours as needed. 02/04/20   Triplett, Cari B, FNP  VITAMIN D PO Take by mouth daily.    [provider]    Allergies  Allergen Reactions   Ciprofloxacin Other (See Comments)   Erythromycin Swelling   Keflex [Cephalexin] Swelling   Penicillins Swelling    Has patient had a PCN reaction causing immediate rash, facial/tongue/throat swelling, SOB or lightheadedness with hypotension: Yes Has patient had a PCN reaction causing severe rash involving  mucus membranes or skin necrosis: No Has patient had a PCN reaction that required hospitalization: Unknown Has patient had a PCN reaction occurring within the last 10 years: No If all of the above answers are "NO", then may proceed with Cephalosporin use.     Family History  Problem Relation Age of Onset   Stroke Mother    COPD Father     Social History Social History   Tobacco Use   Smoking status: Former    Types: Cigarettes    Quit date: 04/20/1958    Years since quitting: 62.6   Smokeless tobacco: Never  Vaping Use   Vaping Use: Never used  Substance Use Topics   Alcohol use: Yes    Alcohol/week: 1.0 standard drink    Types: 1 Glasses of wine per week   Drug use: Never    Review of Systems Constitutional: Negative for  fever.  Intermittent dizziness x3 days. Cardiovascular: Negative for chest pain. Respiratory: Negative for shortness of breath. Gastrointestinal: Negative for abdominal pain, vomiting and diarrhea. Genitourinary: Negative for urinary compaints Musculoskeletal: Negative for musculoskeletal complaints Neurological: Negative for headache All other ROS negative  ____________________________________________   PHYSICAL EXAM:  VITAL SIGNS: ED Triage Vitals  Enc Vitals Group     BP 12/01/20 1237 134/82     Pulse Rate 12/01/20 1237 84     Resp 12/01/20 1237 15     Temp 12/01/20 1237 98.8 F (37.1 C)     Temp Source 12/01/20 1237 Oral     SpO2 12/01/20 1237 94 %     Weight 12/01/20 1233 240 lb (108.9 kg)     Height 12/01/20 1233 '6\' 4"'$  (1.93 m)     Head Circumference --      Peak Flow --      Pain Score 12/01/20 1233 0     Pain Loc --      Pain Edu? --      Excl. in [place]? --     Constitutional: Alert and oriented. Well appearing and in no distress.  Denies any dizziness currently. Eyes: Normal exam ENT      Head: Normocephalic and atraumatic.      Mouth/Throat: Mucous membranes are moist. Cardiovascular: Normal rate, regular rhythm. No  murmurs, rubs, or gallops. Respiratory: Normal respiratory effort without tachypnea nor retractions. Breath sounds are clear and equal bilaterally. No wheezes/rales/rhonchi. Gastrointestinal: Soft and nontender. No distention.  Musculoskeletal: Nontender with normal range of motion in all extremities. No lower extremity tenderness or edema. Neurologic:  Normal speech and language. No gross focal neurologic deficits are appreciated. Skin:  Skin is warm, dry and intact.  Psychiatric: Mood and affect are normal. Speech and behavior are normal.   ____________________________________________    EKG  EKG viewed and interpreted by myself shows normal sinus rhythm 81 bpm with a widened QRS, left axis deviation, PR prolongation otherwise normal intervals with nonspecific but no concerning ST changes overall morphology consistent with left bundle branch block.  ____________________________________________    RADIOLOGY  CT head is negative  ____________________________________________   INITIAL IMPRESSION / ASSESSMENT AND PLAN / ED COURSE  Pertinent labs & imaging results that were available during my care of the patient were reviewed by me and considered in my medical decision making (see chart for details).   Patient presents emergency department for 3 days of intermittent dizziness.  He describes his dizziness is mostly with changes in position such as sitting up standing up or lying down.  States as long as he lies still however the dizziness resolves.  Denies any dizziness currently.  Differential would include vertigo, less likely CVA given an otherwise intact neurological exam, metabolic or electrolyte abnormality or infectious etiology.  We will check labs including a urinalysis obtain a CT scan of the head and continue to closely monitor.  Patient agreeable to plan of care.  CT scan of the head is negative.  Discussed MRI however patient is reassured by the work-up thus far and would  like to try meclizine, highly suspect vertigo and I believe this is a reasonable plan.  I discussed with the patient if he changes his mind if his symptoms worsen he is to return to the emergency department immediately for further work-up and possible MRI.  Otherwise meclizine hopefully should help his vertigo and the patient will follow-up with his doctor.  Mark [name]. was evaluated in  Emergency Department on 12/01/2020 for the symptoms described in the history of present illness. He was evaluated in the context of the global COVID-19 pandemic, which necessitated consideration that the patient might be at risk for infection with the SARS-CoV-2 virus that causes COVID-19. Institutional protocols and algorithms that pertain to the evaluation of patients at risk for COVID-19 are in a state of rapid change based on information released by regulatory bodies including the CDC and federal and state organizations. These policies and algorithms were followed during the patient's care in the ED.  ____________________________________________   FINAL CLINICAL IMPRESSION(S) / ED DIAGNOSES  Dizziness   Mark Dark, MD 12/01/20 1514

## 2020-12-01 NOTE — ED Triage Notes (Addendum)
Pt to ED from home [facility]. CBG 84, vs normal, wide QRS per EMS on 12 lead  Pt called EMS because dizzy since 3 days and nauseous today States has not eaten today, was nauseous this morning Pt is alert and oriented; stood and transferred to stretcher  Pt denies unilateral weakness. Pt states feels dizzy laying down but more with position changes  Denies NVD, CP, SOB

## 2020-12-01 NOTE — ED Notes (Signed)
Pt helped to void in room.

## 2020-12-01 NOTE — ED Notes (Signed)
Pt discharged to home. Discharge instructions have been discussed with patient and/or family members. Pt verbally acknowledges understanding d/c instructions, and endorses comprehension to checkout at registration before leaving.  °

## 2020-12-01 NOTE — ED Notes (Signed)
IV removed.

## 2020-12-10 ENCOUNTER — Encounter: Payer: Self-pay | Admitting: Psychiatry

## 2020-12-10 ENCOUNTER — Ambulatory Visit (INDEPENDENT_AMBULATORY_CARE_PROVIDER_SITE_OTHER): Payer: Medicare Other | Admitting: Psychiatry

## 2020-12-10 ENCOUNTER — Other Ambulatory Visit: Payer: Self-pay

## 2020-12-10 VITALS — BP 108/61 | HR 80 | Temp 97.3°F

## 2020-12-10 DIAGNOSIS — F3342 Major depressive disorder, recurrent, in full remission: Secondary | ICD-10-CM | POA: Diagnosis not present

## 2020-12-10 DIAGNOSIS — G4701 Insomnia due to medical condition: Secondary | ICD-10-CM | POA: Diagnosis not present

## 2020-12-10 MED ORDER — BUPROPION HCL ER (XL) 150 MG PO TB24: 150.0000 mg | ORAL_TABLET | Freq: Every day | ORAL | 1 refills | Status: DC

## 2020-12-10 MED ORDER — FLUOXETINE HCL 40 MG PO CAPS: 40.0000 mg | ORAL_CAPSULE | Freq: Every day | ORAL | 1 refills | Status: DC

## 2020-12-10 NOTE — Progress Notes (Signed)
[place] MD OP Progress Note  12/10/2020 1:57 PM Mark [name].  MRN:  [id number]  Chief Complaint:  Chief Complaint   Follow-up; Anxiety; Depression    HPI: Mark [name]. Is a 80 year old Caucasian male, retired, lives in [place], has a history of MDD, insomnia, pneumothorax, history of syncopal episodes, BPH, multiple other medical problems including restless leg syndrome, obstructive sleep apnea noncompliant with CPAP was evaluated in office today.  Patient as well as wife participated in the evaluation today.  Collateral information was obtained from wife-Mark [name].  Patient appeared to be alert, oriented to person place and situation.  Patient was able to answer questions appropriately.  He appeared to be cheerful and pleasant in session, did not appear to be depressed or anxious.  Patient reports sleep is good.  Reports appetite is fair.  Patient had a recent fall and he tried to get up without his wife's help and also another fall recently since he had vertigo.  He was seen in the emergency department and was started on meclizine.  I have reviewed notes per Dr. Jones Skene 12/01/2020.  Wife reports patient is currently making progress.  Currently denies any vertigo.  He did not take the meclizine after a couple of doses since he felt he did not need it.  He continues to be compliant on his medications otherwise including his Prozac and Wellbutrin.  Denies side effects.  Patient as well as wife recently had COVID-19 infection-patient received infusion treatment at [facility]-10/14/2020.  He has completely recovered.  Denies any suicidality, homicidality or perceptual disturbances.    Visit Diagnosis:    ICD-10-CM   1. MDD (major depressive disorder), recurrent, in full remission ([place])  F33.42 buPROPion (WELLBUTRIN XL) 150 MG 24 hr tablet    2. Insomnia due to medical condition  G47.01 FLUoxetine (PROZAC) 40 MG capsule   urinary frequency- nocturia      Past Psychiatric History:  Reviewed past psychiatric history from progress note on 04/20/2018.  Past trials of Prozac, Wellbutrin  Past Medical History:  Past Medical History:  Diagnosis Date   Anxiety    Atrial fibrillation (HCC)    Back pain, chronic    Cancer (HCC)    skin   Colon polyp    Cyst of left kidney    GERD (gastroesophageal reflux disease)    Heart murmur    Hyperlipemia    Mitral valve prolapse    Neuromuscular disorder (HCC)    Neuropathy    Osteoarthritis    Pulmonary emboli (HCC)    Spinal stenosis    Thyroid nodule    Vertigo     Past Surgical History:  Procedure Laterality Date   APPENDECTOMY     BACK SURGERY     COLONOSCOPY N/A 08/15/2014   Procedure: COLONOSCOPY;  Surgeon: [name], MD;  Location: [facility];  Service: Endoscopy;  Laterality: N/A;   ESOPHAGOGASTRODUODENOSCOPY N/A 08/15/2014   Procedure: ESOPHAGOGASTRODUODENOSCOPY (EGD);  Surgeon: [name], MD;  Location: [facility] ENDOSCOPY;  Service: Endoscopy;  Laterality: N/A;   TONSILLECTOMY      Family Psychiatric History: Reviewed family psychiatric history from progress note on 04/20/2018  Family History:  Family History  Problem Relation Age of Onset   Stroke Mother    COPD Father     Social History: Reviewed social history from progress note on 04/20/2018 Social History   Socioeconomic History   Marital status: Married    Spouse name: Mark [name]   Number of children: 2  Years of education: Not on file   Highest education level: Master's degree (e.g., MA, MS, MEng, MEd, MSW, MBA)  Occupational History   Not on file  Tobacco Use   Smoking status: Former    Types: Cigarettes    Quit date: 04/20/1958    Years since quitting: 62.6   Smokeless tobacco: Never  Vaping Use   Vaping Use: Never used  Substance and Sexual Activity   Alcohol use: Yes    Alcohol/week: 1.0 standard drink    Types: 1 Glasses of wine per week   Drug use: Never   Sexual activity: Not Currently  Other Topics Concern   Not on  file  Social History Narrative   Not on file   Social Determinants of Health   Financial Resource Strain: Not on file  Food Insecurity: Not on file  Transportation Needs: Not on file  Physical Activity: Not on file  Stress: Not on file  Social Connections: Not on file    Allergies:  Allergies  Allergen Reactions   Ciprofloxacin Other (See Comments)   Erythromycin Swelling   Keflex [Cephalexin] Swelling   Penicillins Swelling    Has patient had a PCN reaction causing immediate rash, facial/tongue/throat swelling, SOB or lightheadedness with hypotension: Yes Has patient had a PCN reaction causing severe rash involving mucus membranes or skin necrosis: No Has patient had a PCN reaction that required hospitalization: Unknown Has patient had a PCN reaction occurring within the last 10 years: No If all of the above answers are "NO", then may proceed with Cephalosporin use.     Metabolic Disorder Labs: No results found for: HGBA1C, MPG No results found for: PROLACTIN No results found for: CHOL, TRIG, HDL, CHOLHDL, VLDL, LDLCALC Lab Results  Component Value Date   TSH 0.866 02/21/2018    Therapeutic Level Labs: No results found for: LITHIUM No results found for: VALPROATE No components found for:  CBMZ  Current Medications: Current Outpatient Medications  Medication Sig Dispense Refill   apixaban (ELIQUIS) 2.5 MG TABS tablet Take 2.5 mg by mouth 2 (two) times daily.     B Complex Vitamins (VITAMIN B COMPLEX) TABS Take by mouth.     Cholecalciferol 25 MCG (1000 UT) tablet Take by mouth.     clotrimazole-betamethasone (LOTRISONE) cream Apply 1 application topically 2 (two) times daily.      Coenzyme Q10 (CO Q 10 PO) Take 1 Dose by mouth daily.      Cranberry 200 MG CAPS Take 200 mg by mouth daily.     Cyanocobalamin (B-12) 5000 MCG CAPS Take 5,000 mg by mouth daily. (Patient taking differently: Take 5,000 mcg by mouth daily.) 30 capsule 0   esomeprazole (NEXIUM) 40 MG  capsule Take 40 mg by mouth daily.     finasteride (PROSCAR) 5 MG tablet Take 5 mg by mouth daily.      FLUoxetine (PROZAC) 20 MG capsule TAKE 1 CAPSULE BY MOUTH ONCE DAILY. TO BE COMBINED WITH 40MG . 90 capsule 1   meclizine (ANTIVERT) 25 MG tablet Take 1 tablet (25 mg total) by mouth 3 (three) times daily as needed for dizziness. 30 tablet 0   MOVANTIK 12.5 MG TABS tablet Take 12.5 mg by mouth daily.     nystatin-triamcinolone ointment (MYCOLOG) APPLY TO AFFECTED AREAS TWICE DAILY 30 g 1   ondansetron (ZOFRAN) 4 MG tablet Take 1 tablet (4 mg total) by mouth every 8 (eight) hours as needed for up to 10 doses for nausea or vomiting. 10 tablet  0   simvastatin (ZOCOR) 40 MG tablet Take 40 mg by mouth at bedtime.      tamsulosin (FLOMAX) 0.4 MG CAPS capsule Take 0.4 mg by mouth daily.      traMADol (ULTRAM) 50 MG tablet Take 1 tablet (50 mg total) by mouth every 6 (six) hours as needed. 20 tablet 0   VITAMIN D PO Take by mouth daily.     buPROPion (WELLBUTRIN XL) 150 MG 24 hr tablet Take 1 tablet (150 mg total) by mouth daily. 90 tablet 1   FLUoxetine (PROZAC) 40 MG capsule Take 1 capsule (40 mg total) by mouth daily. To be combined with 20 mg 90 capsule 1   Current Facility-Administered Medications  Medication Dose Route Frequency Provider Last Rate Last Admin   nystatin (MYCOSTATIN/NYSTOP) topical powder   Topical BID Hollice Espy, MD         Musculoskeletal: Strength & Muscle Tone: within normal limits Gait & Station:  wheel chair bound Patient leans: Backward  Psychiatric Specialty Exam: Review of Systems  Psychiatric/Behavioral:  Negative for agitation, behavioral problems, confusion, decreased concentration, dysphoric mood, hallucinations, self-injury, sleep disturbance and suicidal ideas. The patient is not nervous/anxious and is not hyperactive.   All other systems reviewed and are negative.  Blood pressure 108/61, pulse 80, temperature (!) 97.3 F (36.3 C), temperature source  Temporal.There is no height or weight on file to calculate BMI.  General Appearance: Casual  Eye Contact:  Fair  Speech:  Clear and Coherent  Volume:  Normal  Mood:  Euthymic  Affect:  Congruent  Thought Process:  Goal Directed and Descriptions of Associations: Intact  Orientation:  person, place, situation  Thought Content: Logical   Suicidal Thoughts:  No  Homicidal Thoughts:  No  Memory:  Immediate;   Fair Recent;   Fair Remote;   limited  Judgement:  Fair  Insight:  Fair  Psychomotor Activity:  Normal  Concentration:  Concentration: Fair and Attention Span: Fair  Recall:  [employer] of Knowledge: Fair  Language: Fair  Akathisia:  No  Handed:  Right  AIMS (if indicated): done  Assets:  Communication Skills Desire for [place] Talents/Skills Transportation Vocational/Educational  ADL's:  Intact  Cognition: WNL  Sleep:  Fair   Screenings: PHQ2-9    [place] Office Visit from 12/10/2020 in [facility]  PHQ-2 Total Score 0      [place] Visit from 12/10/2020 in [facility] ED from 12/01/2020 in [facility] from 10/13/2020 in [facility] CATEGORY No Risk No Risk No Risk        Assessment and Plan: Mark Garrisonis a 80 year old Caucasian male, lives in [place], has a history of depression, BPH, history of falls, syncopal episodes, basal cell carcinoma, hyperlipidemia was evaluated in office today.  Patient is currently stable.  MDD in full remission Wellbutrin XL 150 mg p.o. daily Prozac 60 mg p.o. daily AIMS - 0  Insomnia-stable Continue sleep hygiene techniques Noncompliant with CPAP  Reviewed notes per Dr. Kerman Passey dated 12/01/2020 as noted above.  Collateral information obtained from wife as noted above.  Follow-up in clinic in 6 months in  person.  This note was generated in part or whole with voice recognition software. Voice recognition is usually quite accurate but there are transcription errors that can and very often do occur. I apologize for any typographical errors that  were not detected and corrected.       Ursula Alert, MD 12/11/2020, 8:41 AM

## 2020-12-23 NOTE — Progress Notes (Signed)
12/24/20 11:06 AM   Mark [name]. 1940-10-13 [id number]  Referring provider:  Tracie Harrier, MD [address] [facility] [place],  [place] [address] Chief Complaint  Patient presents with   Benign Prostatic Hypertrophy     HPI: Mark [name]. is a 80 y.o.male with a personal history of phimosis, microscopic hematuria, and BPH with urinary frequency, who returns today for a 1 year follow-up with IPSS and PVR.    CT A/P w/ contrast on 10/25/2019 noted no acute intra-abdominal or pelvic pathology. No bowel obstruction. Small scattered sigmoid diverticula. Fatty liver.  Cystoscopy on 12/26/2019 was unremarkable with no abnormalities.   Recent PSA on 10/30/2020  was 0.17.   He is currently on flomax and finasteride for his urinary symptoms. He takes mycolog as needed for history of phimosis and balanoposthitis.  He reports the intermittedly experiences some difficulty urinating.  His main concern is nocturia. He wonders if there is a surgery that can fix this issue.    IPSS     Row Name 12/24/20 1100         International Prostate Symptom Score   How often have you had the sensation of not emptying your bladder? Less than 1 in 5     How often have you had to urinate less than every two hours? Not at All     How often have you found you stopped and started again several times when you urinated? Not at All     How often have you found it difficult to postpone urination? Less than 1 in 5 times     How often have you had a weak urinary stream? Not at All     How often have you had to strain to start urination? Not at All     How many times did you typically get up at night to urinate? 3 Times     Total IPSS Score 5           Quality of Life due to urinary symptoms   If you were to spend the rest of your life with your urinary condition just the way it is now how would you feel about that? Pleased              Score:  1-7 Mild 8-19  Moderate 20-35 Severe    PMH: Past Medical History:  Diagnosis Date   Anxiety    Atrial fibrillation (HCC)    Back pain, chronic    Cancer (HCC)    skin   Colon polyp    Cyst of left kidney    GERD (gastroesophageal reflux disease)    Heart murmur    Hyperlipemia    Mitral valve prolapse    Neuromuscular disorder (HCC)    Neuropathy    Osteoarthritis    Pulmonary emboli (HCC)    Spinal stenosis    Thyroid nodule    Vertigo     Surgical History: Past Surgical History:  Procedure Laterality Date   APPENDECTOMY     BACK SURGERY     COLONOSCOPY N/A 08/15/2014   Procedure: COLONOSCOPY;  Surgeon: [name], MD;  Location: [facility];  Service: Endoscopy;  Laterality: N/A;   ESOPHAGOGASTRODUODENOSCOPY N/A 08/15/2014   Procedure: ESOPHAGOGASTRODUODENOSCOPY (EGD);  Surgeon: [name], MD;  Location: [facility] ENDOSCOPY;  Service: Endoscopy;  Laterality: N/A;   TONSILLECTOMY      Home Medications:  Allergies as of 12/24/2020  Reactions   Ciprofloxacin Other (See Comments)   Erythromycin Swelling   Keflex [cephalexin] Swelling   Penicillins Swelling   Has patient had a PCN reaction causing immediate rash, facial/tongue/throat swelling, SOB or lightheadedness with hypotension: Yes Has patient had a PCN reaction causing severe rash involving mucus membranes or skin necrosis: No Has patient had a PCN reaction that required hospitalization: Unknown Has patient had a PCN reaction occurring within the last 10 years: No If all of the above answers are "NO", then may proceed with Cephalosporin use.        Medication List        Accurate as of December 24, 2020 11:06 AM. If you have any questions, ask your nurse or doctor.          apixaban 2.5 MG Tabs tablet Commonly known as: ELIQUIS Take 2.5 mg by mouth 2 (two) times daily.   B-12 5000 MCG Caps Take 5,000 mg by mouth daily. What changed: how much to take   buPROPion 150 MG 24 hr tablet Commonly  known as: WELLBUTRIN XL Take 1 tablet (150 mg total) by mouth daily.   Cholecalciferol 25 MCG (1000 UT) tablet Take by mouth.   clotrimazole-betamethasone cream Commonly known as: LOTRISONE Apply 1 application topically 2 (two) times daily.   CO Q 10 PO Take 1 Dose by mouth daily.   Cranberry 200 MG Caps Take 200 mg by mouth daily.   esomeprazole 40 MG capsule Commonly known as: NEXIUM Take 40 mg by mouth daily.   finasteride 5 MG tablet Commonly known as: PROSCAR Take 5 mg by mouth daily.   FLUoxetine 40 MG capsule Commonly known as: PROzac Take 1 capsule (40 mg total) by mouth daily. To be combined with 20 mg What changed: Another medication with the same name was removed. Continue taking this medication, and follow the directions you see here. Changed by: Hollice Espy, MD   meclizine 25 MG tablet Commonly known as: ANTIVERT Take 1 tablet (25 mg total) by mouth 3 (three) times daily as needed for dizziness.   Movantik 12.5 MG Tabs tablet Generic drug: naloxegol oxalate Take 12.5 mg by mouth daily.   nystatin-triamcinolone ointment Commonly known as: MYCOLOG APPLY TO AFFECTED AREAS TWICE DAILY   ondansetron 4 MG tablet Commonly known as: Zofran Take 1 tablet (4 mg total) by mouth every 8 (eight) hours as needed for up to 10 doses for nausea or vomiting.   simvastatin 40 MG tablet Commonly known as: ZOCOR Take 40 mg by mouth at bedtime.   tamsulosin 0.4 MG Caps capsule Commonly known as: FLOMAX Take 0.4 mg by mouth daily.   traMADol 50 MG tablet Commonly known as: Ultram Take 1 tablet (50 mg total) by mouth every 6 (six) hours as needed.   Vitamin B Complex Tabs Take by mouth.   VITAMIN D PO Take by mouth daily.        Allergies:  Allergies  Allergen Reactions   Ciprofloxacin Other (See Comments)   Erythromycin Swelling   Keflex [Cephalexin] Swelling   Penicillins Swelling    Has patient had a PCN reaction causing immediate rash,  facial/tongue/throat swelling, SOB or lightheadedness with hypotension: Yes Has patient had a PCN reaction causing severe rash involving mucus membranes or skin necrosis: No Has patient had a PCN reaction that required hospitalization: Unknown Has patient had a PCN reaction occurring within the last 10 years: No If all of the above answers are "NO", then may proceed with Cephalosporin use.  Family History: Family History  Problem Relation Age of Onset   Stroke Mother    COPD Father     Social History:  reports that he quit smoking about 62 years ago. His smoking use included cigarettes. He has never used smokeless tobacco. He reports current alcohol use of about 1.0 standard drink per week. He reports that he does not use drugs.   Physical Exam: BP (!) 153/77   Pulse 90   Ht 6\' 4"  (1.93 m)   Wt 240 lb (108.9 kg)   BMI 29.21 kg/m   Constitutional:  Alert and oriented, No acute distress.  In wheelchair, accompanied by his wife today. HEENT: [place] AT, moist mucus membranes.  Trachea midline, no masses. Cardiovascular: No clubbing, cyanosis, or edema. Respiratory: Normal respiratory effort, no increased work of breathing. Skin: No rashes, bruises or suspicious lesions. Neurologic: Grossly intact, no focal deficits, moving all 4 extremities. Psychiatric: Normal mood and affect.  Laboratory Data:  Lab Results  Component Value Date   CREATININE 1.04 12/01/2020    Pertinent Imaging: Results for orders placed or performed in visit on 12/24/20  BLADDER SCAN AMB NON-IMAGING  Result Value Ref Range   Scan Result 2 ml     Assessment & Plan:    History of phimosis  - Continue mycolog as needed   - He is not interested in circumcision   BPH with nocturia - Continue Flomax and finasteride  - Rectal exam differed based on age and comorbidity  - Counseled him on behavorial management of nocturia  such as not drinking liquid before bedtime.  - talked about the pathophysiology of  nocturia we discussed options of surgery / procedure and if they would have any improvement on current nocturia; given that nocturia multifactorial, is difficult to ascertain whether or not intervention would be successful.  If there is no guarantees, he does not want to pursue intervention.  He would prefer to continue medications and modify his behavior.  3 Microscopic hematuria  -Recheck urine next year - Cystoscopy 12/26/2019 unremarkable as well as unremarkable CT urogram.   F/u 1 year IPSS/ PVR/ UA  I,Kailey Littlejohn,acting as a scribe for Hollice Espy, MD.,have documented all relevant documentation on the behalf of Hollice Espy, MD,as directed by  Hollice Espy, MD while in the presence of Hollice Espy, MD.  I have reviewed the above documentation for accuracy and completeness, and I agree with the above.   Hollice Espy, MD   [facility] [address], [place], [place] [address] [phone]

## 2020-12-24 ENCOUNTER — Ambulatory Visit (INDEPENDENT_AMBULATORY_CARE_PROVIDER_SITE_OTHER): Payer: Medicare Other | Admitting: Urology

## 2020-12-24 ENCOUNTER — Other Ambulatory Visit: Payer: Self-pay

## 2020-12-24 VITALS — BP 153/77 | HR 90 | Ht 76.0 in | Wt 240.0 lb

## 2020-12-24 DIAGNOSIS — N401 Enlarged prostate with lower urinary tract symptoms: Secondary | ICD-10-CM

## 2020-12-24 DIAGNOSIS — R350 Frequency of micturition: Secondary | ICD-10-CM | POA: Diagnosis not present

## 2020-12-24 LAB — BLADDER SCAN AMB NON-IMAGING: Scan Result: 2

## 2020-12-24 MED ORDER — FINASTERIDE 5 MG PO TABS: 5.0000 mg | ORAL_TABLET | Freq: Every day | ORAL | 3 refills | Status: DC

## 2020-12-24 MED ORDER — TAMSULOSIN HCL 0.4 MG PO CAPS: 0.4000 mg | ORAL_CAPSULE | Freq: Every day | ORAL | 3 refills | Status: DC

## 2021-03-28 ENCOUNTER — Other Ambulatory Visit: Payer: Self-pay | Admitting: Psychiatry

## 2021-03-31 ENCOUNTER — Other Ambulatory Visit: Payer: Self-pay | Admitting: Psychiatry

## 2021-05-12 ENCOUNTER — Other Ambulatory Visit: Payer: Self-pay | Admitting: Psychiatry

## 2021-06-10 ENCOUNTER — Ambulatory Visit: Payer: Medicare Other | Admitting: Psychiatry

## 2021-07-21 ENCOUNTER — Ambulatory Visit (INDEPENDENT_AMBULATORY_CARE_PROVIDER_SITE_OTHER): Payer: Medicare Other | Admitting: Psychiatry

## 2021-07-21 ENCOUNTER — Encounter: Payer: Self-pay | Admitting: Psychiatry

## 2021-07-21 VITALS — BP 123/73 | HR 80 | Temp 98.1°F

## 2021-07-21 DIAGNOSIS — F3342 Major depressive disorder, recurrent, in full remission: Secondary | ICD-10-CM

## 2021-07-21 DIAGNOSIS — G309 Alzheimer's disease, unspecified: Secondary | ICD-10-CM | POA: Insufficient documentation

## 2021-07-21 DIAGNOSIS — G3184 Mild cognitive impairment, so stated: Secondary | ICD-10-CM | POA: Insufficient documentation

## 2021-07-21 DIAGNOSIS — F09 Unspecified mental disorder due to known physiological condition: Secondary | ICD-10-CM | POA: Diagnosis not present

## 2021-07-21 DIAGNOSIS — G4701 Insomnia due to medical condition: Secondary | ICD-10-CM

## 2021-07-21 DIAGNOSIS — F03B2 Unspecified dementia, moderate, with psychotic disturbance: Secondary | ICD-10-CM | POA: Insufficient documentation

## 2021-07-21 MED ORDER — FLUOXETINE HCL 20 MG PO CAPS: ORAL_CAPSULE | ORAL | 1 refills | Status: DC

## 2021-07-21 MED ORDER — FLUOXETINE HCL 40 MG PO CAPS: 40.0000 mg | ORAL_CAPSULE | Freq: Every day | ORAL | 1 refills | Status: DC

## 2021-07-21 MED ORDER — BUPROPION HCL 75 MG PO TABS: 75.0000 mg | ORAL_TABLET | Freq: Every day | ORAL | 1 refills | Status: DC

## 2021-07-21 NOTE — Progress Notes (Signed)
[facility] MD OP Progress Note ? ?07/21/2021 3:02 PM ?Mark [name].  ?MRN:  [id number] ? ?Chief Complaint:  ?Chief Complaint  ?Patient presents with  ? Follow-up: 81 year old Caucasian male with history of multiple medical problems, depression, insomnia, presented for medication management.  ? ?HPI: Mark [name]. Is a 81 year old Caucasian male, retired, lives in [place], has a history of MDD, insomnia, pneumothorax, history of syncopal episodes, BPH, multiple other medical problems including restless leg syndrome, obstructive sleep apnea noncompliant with CPAP, memory problems, presented for medication management. ? ?Patient as well as wife participated in the evaluation today.  Collateral information was obtained from wife-[name]. ? ?Patient today appeared to be alert, oriented to person place time situation.  Patient reports overall mood symptoms are stable.  He does not feel depressed.  His anxiety is more under control.  He does get frustrated on and off when things does not go the way he wants it to or when he drops something on the floor.  He does have trouble walking long distances.  He uses a walker at home.  He currently is in a wheelchair, needed it since he had to walk a long way from where he parked his car. ? ?As per wife patient is overall doing well with regards to mood. ? ?Patient as well as wife concerned about memory changes, likely having some trouble with short-term memory loss.  A slums was completed in session today-patient scored 23 out of 30.  Agreeable to referral for neurological consultation. ? ?Denies any suicidality, homicidality or perceptual disturbances. ? ?Visit Diagnosis:  ?  ICD-10-CM   ?1. MDD (major depressive disorder), recurrent, in full remission ([place])  F33.42 buPROPion (WELLBUTRIN) 75 MG tablet  ?  FLUoxetine (PROZAC) 40 MG capsule  ?  FLUoxetine (PROZAC) 20 MG capsule  ?  ?2. Insomnia due to medical condition  G47.01   ? mood, nocturia  ?  ?3. Cognitive disorder  F09   ?  unspecified  ?  ? ? ?Past Psychiatric History: Reviewed past psychiatric history from progress note on 04/20/2018.  Past trials of Prozac, Wellbutrin. ? ?Past Medical History:  ?Past Medical History:  ?Diagnosis Date  ? Anxiety   ? Atrial fibrillation ([place])   ? Back pain, chronic   ? Cancer [facility])   ? skin  ? Colon polyp   ? Cyst of left kidney   ? GERD (gastroesophageal reflux disease)   ? Heart murmur   ? Hyperlipemia   ? Mitral valve prolapse   ? Neuromuscular disorder ([place])   ? Neuropathy   ? Osteoarthritis   ? Pulmonary emboli ([place])   ? Spinal stenosis   ? Thyroid nodule   ? Vertigo   ?  ?Past Surgical History:  ?Procedure Laterality Date  ? APPENDECTOMY    ? BACK SURGERY    ? COLONOSCOPY N/A 08/15/2014  ? Procedure: COLONOSCOPY;  Surgeon: [name], MD;  Location: [facility] ENDOSCOPY;  Service: Endoscopy;  Laterality: N/A;  ? ESOPHAGOGASTRODUODENOSCOPY N/A 08/15/2014  ? Procedure: ESOPHAGOGASTRODUODENOSCOPY (EGD);  Surgeon: [name], MD;  Location: [facility] ENDOSCOPY;  Service: Endoscopy;  Laterality: N/A;  ? TONSILLECTOMY    ? ? ?Family Psychiatric History: Reviewed family psychiatric history from progress note on 04/20/2018. ? ?Family History:  ?Family History  ?Problem Relation Age of Onset  ? Stroke Mother   ? COPD Father   ? ? ?Social History: Reviewed social history from progress note on 04/20/2018. ?Social History  ? ?Socioeconomic History  ?  Marital status: Married  ?  Spouse name: gail  ? Number of children: 2  ? Years of education: Not on file  ? Highest education level: Master's degree (e.g., MA, MS, MEng, MEd, MSW, MBA)  ?Occupational History  ? Not on file  ?Tobacco Use  ? Smoking status: Former  ?  Types: Cigarettes  ?  Quit date: 04/20/1958  ?  Years since quitting: 63.2  ? Smokeless tobacco: Never  ?Vaping Use  ? Vaping Use: Never used  ?Substance and Sexual Activity  ? Alcohol use: Yes  ?  Alcohol/week: 1.0 standard drink  ?  Types: 1 Glasses of wine per week  ? Drug use: Never  ? Sexual  activity: Not Currently  ?Other Topics Concern  ? Not on file  ?Social History Narrative  ? Not on file  ? ?Social Determinants of Health  ? ?Financial Resource Strain: Not on file  ?Food Insecurity: Not on file  ?Transportation Needs: Not on file  ?Physical Activity: Not on file  ?Stress: Not on file  ?Social Connections: Not on file  ? ? ?Allergies:  ?Allergies  ?Allergen Reactions  ? Ciprofloxacin Other (See Comments)  ? Erythromycin Swelling  ? Keflex [Cephalexin] Swelling  ? Penicillins Swelling  ?  Has patient had a PCN reaction causing immediate rash, facial/tongue/throat swelling, SOB or lightheadedness with hypotension: Yes ?Has patient had a PCN reaction causing severe rash involving mucus membranes or skin necrosis: No ?Has patient had a PCN reaction that required hospitalization: Unknown ?Has patient had a PCN reaction occurring within the last 10 years: No ?If all of the above answers are "NO", then may proceed with Cephalosporin use. ?  ? ? ?Metabolic Disorder Labs: ?No results found for: HGBA1C, MPG ?No results found for: PROLACTIN ?No results found for: CHOL, TRIG, HDL, CHOLHDL, VLDL, LDLCALC ?Lab Results  ?Component Value Date  ? TSH 0.866 02/21/2018  ? ? ?Therapeutic Level Labs: ?No results found for: LITHIUM ?No results found for: VALPROATE ?No components found for:  CBMZ ? ?Current Medications: ?Current Outpatient Medications  ?Medication Sig Dispense Refill  ? apixaban (ELIQUIS) 2.5 MG TABS tablet Take 2.5 mg by mouth 2 (two) times daily.    ? B Complex Vitamins (VITAMIN B COMPLEX) TABS Take by mouth.    ? buPROPion (WELLBUTRIN) 75 MG tablet Take 1 tablet (75 mg total) by mouth daily with breakfast. 90 tablet 1  ? Cholecalciferol 25 MCG (1000 UT) tablet Take by mouth.    ? clotrimazole-betamethasone (LOTRISONE) cream Apply 1 application topically 2 (two) times daily.     ? Coenzyme Q10 (CO Q 10 PO) Take 1 Dose by mouth daily.     ? Cranberry 200 MG CAPS Take 200 mg by mouth daily.    ?  Cyanocobalamin (B-12) 5000 MCG CAPS Take 5,000 mg by mouth daily. (Patient taking differently: Take 5,000 mcg by mouth daily.) 30 capsule 0  ? esomeprazole (NEXIUM) 40 MG capsule Take 40 mg by mouth daily.    ? finasteride (PROSCAR) 5 MG tablet Take 1 tablet (5 mg total) by mouth daily. 90 tablet 3  ? FLUoxetine (PROZAC) 20 MG capsule TAKE 1 CAPSULE BY MOUTH ONCE DAILY. TO BE COMBINED WITH '40MG'$ . 90 capsule 1  ? FLUoxetine (PROZAC) 40 MG capsule Take 1 capsule (40 mg total) by mouth daily. To be combined with 20 mg 90 capsule 1  ? meclizine (ANTIVERT) 25 MG tablet Take 1 tablet (25 mg total) by mouth 3 (three) times daily as needed  for dizziness. 30 tablet 0  ? MOVANTIK 12.5 MG TABS tablet Take 12.5 mg by mouth daily.    ? nystatin-triamcinolone ointment (MYCOLOG) APPLY TO AFFECTED AREAS TWICE DAILY 30 g 1  ? ondansetron (ZOFRAN) 4 MG tablet Take 1 tablet (4 mg total) by mouth every 8 (eight) hours as needed for up to 10 doses for nausea or vomiting. 10 tablet 0  ? simvastatin (ZOCOR) 40 MG tablet Take 40 mg by mouth at bedtime.     ? sulfamethoxazole-trimethoprim (BACTRIM DS) 800-160 MG tablet Take 1 tablet by mouth 2 (two) times daily.    ? tamsulosin (FLOMAX) 0.4 MG CAPS capsule Take 1 capsule (0.4 mg total) by mouth daily. 90 capsule 3  ? traMADol (ULTRAM) 50 MG tablet Take 1 tablet (50 mg total) by mouth every 6 (six) hours as needed. 20 tablet 0  ? VITAMIN D PO Take by mouth daily.    ? ?Current Facility-Administered Medications  ?Medication Dose Route Frequency Provider Last Rate Last Admin  ? nystatin (MYCOSTATIN/NYSTOP) topical powder   Topical BID Hollice Espy, MD      ? ? ? ?Musculoskeletal: ?Strength & Muscle Tone:  baseline ?Gait & Station:  wheelchair bound ?Patient leans: Backward ? ?Psychiatric Specialty Exam: ?Review of Systems  ?Psychiatric/Behavioral:  The patient is nervous/anxious.   ?All other systems reviewed and are negative.  ?Blood pressure 123/73, pulse 80, temperature 98.1 ?F (36.7 ?C),  temperature source Temporal.There is no height or weight on file to calculate BMI.  ?General Appearance: Casual  ?Eye Contact:  Fair  ?Speech:  Clear and Coherent  ?Volume:  Normal  ?Mood:  Anxious coping ok  ?Affect:  Con

## 2021-09-24 NOTE — Progress Notes (Signed)
09/25/21 11:26 AM   Mark [name]. [birth year] [id number]  Referring provider:  Barbette Reichmann, MD [address] [facility] [place],  [place] [address]  Urological history  1.  High risk hematuria  -Former smoker  -contrast CT, 2021 - NED  -cysto, 2021 - NED  -no reports of gross heme  -CATH UA neg micro heme  2. BPH with LU TS  -aged out of prostate cancer screening  -Tamsulosin 0.4 mg daily and finasteride 5 mg daily   3.  Phimosis  -Mycolog cream  Chief Complaint  Patient presents with   Recurrent UTI    HPI: Mark [name]. is a 81 y.o.male who presents today for further evaluation of rUTIs and dysuria with his wife, Dondra Spry.   He was seen in his primary care doctor's office with a complaint of dysuria and frequency on September 11, 2021.  His UA was likely contaminated as he has a severe phimosis and his urine culture was positive for good than 2 organisms present.  He was placed on a round of doxycycline and he states his symptoms abated.  A day or 2 after finishing the doxycycline, he stated he thought his urinary tract infection was returning and that is why the appointment was made today.  Today, he states he feels fine and his cath UA is negative  Patient denies any modifying or aggravating factors.  Patient denies any gross hematuria, dysuria or suprapubic/flank pain.  Patient denies any fevers, chills, nausea or vomiting.    PMH: Past Medical History:  Diagnosis Date   Anxiety    Atrial fibrillation (HCC)    Back pain, chronic    Cancer (HCC)    skin   Colon polyp    Cyst of left kidney    GERD (gastroesophageal reflux disease)    Heart murmur    Hyperlipemia    Mitral valve prolapse    Neuromuscular disorder (HCC)    Neuropathy    Osteoarthritis    Pulmonary emboli (HCC)    Spinal stenosis    Thyroid nodule    Vertigo     Surgical History: Past Surgical History:  Procedure Laterality Date   APPENDECTOMY     BACK SURGERY      COLONOSCOPY N/A 08/15/2014   Procedure: COLONOSCOPY;  Surgeon: Scot Jun, MD;  [facility]: [facility] ENDOSCOPY;  Service: Endoscopy;  Laterality: N/A;   ESOPHAGOGASTRODUODENOSCOPY N/A 08/15/2014   Procedure: ESOPHAGOGASTRODUODENOSCOPY (EGD);  Surgeon: Scot Jun, MD;  Location: [facility] ENDOSCOPY;  Service: Endoscopy;  Laterality: N/A;   TONSILLECTOMY      Home Medications:  Allergies as of 09/25/2021       Reactions   Ciprofloxacin Other (See Comments)   Erythromycin Swelling   Keflex [cephalexin] Swelling   Penicillins Swelling   Has patient had a PCN reaction causing immediate rash, facial/tongue/throat swelling, SOB or lightheadedness with hypotension: Yes Has patient had a PCN reaction causing severe rash involving mucus membranes or skin necrosis: No Has patient had a PCN reaction that required hospitalization: Unknown Has patient had a PCN reaction occurring within the last 10 years: No If all of the above answers are "NO", then may proceed with Cephalosporin use.        Medication List        Accurate as of September 25, 2021 11:26 AM. If you have any questions, ask your nurse or doctor.          STOP taking these medications  clotrimazole-betamethasone cream Commonly known as: LOTRISONE Stopped by: Zara Council, PA-C   nystatin-triamcinolone ointment Commonly known as: MYCOLOG Stopped by: Zara Council, PA-C       TAKE these medications    apixaban 2.5 MG Tabs tablet Commonly known as: ELIQUIS Take 2.5 mg by mouth 2 (two) times daily.   B-12 5000 MCG Caps Take 5,000 mg by mouth daily. What changed: how much to take   betamethasone dipropionate 0.05 % cream Apply topically 2 (two) times daily. Started by: Zara Council, PA-C   buPROPion 75 MG tablet Commonly known as: WELLBUTRIN Take 1 tablet (75 mg total) by mouth daily with breakfast.   Cholecalciferol 25 MCG (1000 UT) tablet Take by mouth.   CO Q 10 PO Take 1 Dose by mouth daily.    Cranberry 200 MG Caps Take 200 mg by mouth daily.   esomeprazole 40 MG capsule Commonly known as: NEXIUM Take 40 mg by mouth daily.   finasteride 5 MG tablet Commonly known as: PROSCAR Take 1 tablet (5 mg total) by mouth daily.   FLUoxetine 40 MG capsule Commonly known as: PROzac Take 1 capsule (40 mg total) by mouth daily. To be combined with 20 mg   FLUoxetine 20 MG capsule Commonly known as: PROZAC TAKE 1 CAPSULE BY MOUTH ONCE DAILY. TO BE COMBINED WITH $RemoveBefor'40MG'[id number]$ .   meclizine 25 MG tablet Commonly known as: ANTIVERT Take 1 tablet (25 mg total) by mouth 3 (three) times daily as needed for dizziness.   Movantik 12.5 MG Tabs tablet Generic drug: naloxegol oxalate Take 12.5 mg by mouth daily.   ondansetron 4 MG tablet Commonly known as: Zofran Take 1 tablet (4 mg total) by mouth every 8 (eight) hours as needed for up to 10 doses for nausea or vomiting.   simvastatin 40 MG tablet Commonly known as: ZOCOR Take 40 mg by mouth at bedtime.   sulfamethoxazole-trimethoprim 800-160 MG tablet Commonly known as: BACTRIM DS Take 1 tablet by mouth 2 (two) times daily.   tamsulosin 0.4 MG Caps capsule Commonly known as: FLOMAX Take 1 capsule (0.4 mg total) by mouth daily.   traMADol 50 MG tablet Commonly known as: Ultram Take 1 tablet (50 mg total) by mouth every 6 (six) hours as needed.   Vitamin B Complex Tabs Take by mouth.   VITAMIN D PO Take by mouth daily.        Allergies:  Allergies  Allergen Reactions   Ciprofloxacin Other (See Comments)   Erythromycin Swelling   Keflex [Cephalexin] Swelling   Penicillins Swelling    Has patient had a PCN reaction causing immediate rash, facial/tongue/throat swelling, SOB or lightheadedness with hypotension: Yes Has patient had a PCN reaction causing severe rash involving mucus membranes or skin necrosis: No Has patient had a PCN reaction that required hospitalization: Unknown Has patient had a PCN reaction occurring within  the last 10 years: No If all of the above answers are "NO", then may proceed with Cephalosporin use.     Family History: Family History  Problem Relation Age of Onset   Stroke Mother    COPD Father     Social History:  reports that he quit smoking about 63 years ago. His smoking use included cigarettes. He has never used smokeless tobacco. He reports current alcohol use of about 1.0 standard drink of alcohol per week. He reports that he does not use drugs.   Physical Exam: BP 116/67   Pulse 79   Ht $R'6\' 4"'DO$  (1.93 m)  Wt 240 lb (108.9 kg)   BMI 29.21 kg/m   Constitutional:  Alert and oriented, No acute distress. HEENT: [place] AT, moist mucus membranes.  Trachea midline Cardiovascular: No clubbing, cyanosis, or edema. Respiratory: Normal respiratory effort, no increased work of breathing  Neurologic: Grossly intact, no focal deficits, moving all 4 extremities. Psychiatric: Normal mood and affect.  Laboratory Data:  Ref Range & Units 3 mo ago  Glucose 70 - 110 mg/dL 110   Sodium 136 - 145 mmol/L 139   Potassium 3.6 - 5.1 mmol/L 3.8   Chloride 97 - 109 mmol/L 104   Carbon Dioxide (CO2) 22.0 - 32.0 mmol/L 28.6   Urea Nitrogen (BUN) 7 - 25 mg/dL 16   Creatinine 0.7 - 1.3 mg/dL 1.1   Glomerular Filtration Rate (eGFR), MDRD Estimate >60 mL/min/1.73sq m 64   Calcium 8.7 - 10.3 mg/dL 9.1   AST  8 - 39 U/L 14   ALT  6 - 57 U/L 19   Alk Phos (alkaline Phosphatase) 34 - 104 U/L 72   Albumin 3.5 - 4.8 g/dL 3.9   Bilirubin, Total 0.3 - 1.2 mg/dL 0.5   Protein, Total 6.1 - 7.9 g/dL 6.3   A/G Ratio 1.0 - 5.0 gm/dL 1.6   Resulting Agency  [facility]    Ref Range & Units 3 mo ago  Hemoglobin A1C 4.2 - 5.6 % 6.1 High    Average Blood Glucose (Calc) mg/dL 128     Ref Range & Units 3 mo ago  Thyroid Stimulating Hormone (TSH) 0.450-5.330 uIU/ml uIU/mL 1.306   Resulting Agency  [facility] - [facility]    Ref Range & Units 3 mo ago  WBC (White Blood Cell Count) 4.1 -  10.2 10^3/uL 7.3   RBC (Red Blood Cell Count) 4.69 - 6.13 10^6/uL 4.68 Low    Hemoglobin 14.1 - 18.1 gm/dL 14.4   Hematocrit 40.0 - 52.0 % 44.1   MCV (Mean Corpuscular Volume) 80.0 - 100.0 fl 94.2   MCH (Mean Corpuscular Hemoglobin) 27.0 - 31.2 pg 30.8   MCHC (Mean Corpuscular Hemoglobin Concentration) 32.0 - 36.0 gm/dL 32.7   Platelet Count 150 - 450 10^3/uL 217   RDW-CV (Red Cell Distribution Width) 11.6 - 14.8 % 12.6   MPV (Mean Platelet Volume) 9.4 - 12.4 fl 10.5   Neutrophils 1.50 - 7.80 10^3/uL 4.66   Lymphocytes 1.00 - 3.60 10^3/uL 1.66   Monocytes 0.00 - 1.50 10^3/uL 0.68   Eosinophils 0.00 - 0.55 10^3/uL 0.23   Basophils 0.00 - 0.09 10^3/uL 0.04   Neutrophil % 32.0 - 70.0 % 63.9   Lymphocyte % 10.0 - 50.0 % 22.8   Monocyte % 4.0 - 13.0 % 9.3   Eosinophil % 1.0 - 5.0 % 3.2   Basophil% 0.0 - 2.0 % 0.5   Immature Granulocyte % <=0.7 % 0.3   Immature Granulocyte Count <=0.06 10^3/L 0.02   Resulting Agency  [facility] - [facility]  Urinalysis Benign I have reviewed the labs.   Pertinent Imaging: N/A  Assessment & Plan:    1. rUTI's -Explained that his phimosis is likely the cause of his recurrent UTI's  2. Phimosis -explained that he may need a circumcision, dorsal slit or try a steroid cream -he would like to try the steroid cream  -diprolene cream sent to pharmacy   Return in about 2 weeks (around 10/09/2021) for recheck .  [facility] [address] [place], [place] [address] [phone] 

## 2021-09-25 ENCOUNTER — Ambulatory Visit (INDEPENDENT_AMBULATORY_CARE_PROVIDER_SITE_OTHER): Payer: Medicare Other | Admitting: Urology

## 2021-09-25 ENCOUNTER — Encounter: Payer: Self-pay | Admitting: Urology

## 2021-09-25 VITALS — BP 116/67 | HR 79 | Ht 76.0 in | Wt 240.0 lb

## 2021-09-25 DIAGNOSIS — Z87440 Personal history of urinary (tract) infections: Secondary | ICD-10-CM | POA: Diagnosis not present

## 2021-09-25 DIAGNOSIS — N471 Phimosis: Secondary | ICD-10-CM | POA: Diagnosis not present

## 2021-09-25 DIAGNOSIS — N390 Urinary tract infection, site not specified: Secondary | ICD-10-CM

## 2021-09-25 LAB — MICROSCOPIC EXAMINATION

## 2021-09-25 LAB — URINALYSIS, COMPLETE
Bilirubin, UA: NEGATIVE
Glucose, UA: NEGATIVE
Ketones, UA: NEGATIVE
Leukocytes,UA: NEGATIVE
Nitrite, UA: NEGATIVE
Protein,UA: NEGATIVE
RBC, UA: NEGATIVE
Specific Gravity, UA: 1.025 (ref 1.005–1.030)
Urobilinogen, Ur: 0.2 mg/dL (ref 0.2–1.0)
pH, UA: 5 (ref 5.0–7.5)

## 2021-09-25 MED ORDER — BETAMETHASONE DIPROPIONATE 0.05 % EX CREA: TOPICAL_CREAM | Freq: Two times a day (BID) | CUTANEOUS | 0 refills | Status: DC

## 2021-09-25 NOTE — Progress Notes (Signed)
In and Out Catheterization  Patient is present today for a I & O catheterization. Patient was cleaned and prepped in a sterile fashion with betadine . A 14 FR coude red rubber cath was inserted complications were noted as: .phimosis noted, unable to visualize meatus but was able to pass the cath with out visualization  , 22m of urine return was noted, urine was orange in color. A clean urine sample was collected for UA. Bladder was drained  And catheter was removed with out difficulty.    Performed by: SFonnie Jarvis CMA

## 2021-10-01 ENCOUNTER — Telehealth: Payer: Self-pay | Admitting: Urology

## 2021-10-01 NOTE — Telephone Encounter (Signed)
[name] (spouse) calling about scheduling procedure, Dorsal split or circumcision and scheduling the procedure. Please call at [phone].

## 2021-10-01 NOTE — Telephone Encounter (Signed)
OK to schedule office procedure.  Needs clearance to hold Eliquis.   Hollice Espy, MD

## 2021-10-01 NOTE — Telephone Encounter (Signed)
Patient states he is still having burning and would like to go ahead with Dorsal slit in office. He does not wish to go to the OR. He is currently on Eliquis 2.'5mg'$ 

## 2021-10-01 NOTE — Telephone Encounter (Signed)
REQUEST FOR CLEARANCE      Date: 10/01/21  Request Clearance from Dr. Rondel Baton  Faxed to: [phone]  Procedure: Dorsal Slit (in office- no sedation)  Date of Procedure: TBD  Provider: Hollice Espy, MD    Clearance : Yes  Reason: Stop Eliquis 2.'5mg'$  3-5 days prior to procedure     Risk Assessment:    Low   '[]'$       Moderate   '[]'$     High   '[]'$            I recommend further assessment/workup prior to procedure:    YES '[]'$      NO  '[]'$   Appointment scheduled for: _______________________   Further recommendations: ______________________________    Physician Signature:__________________________________   Printed Name: ________________________________________   Date: _________________

## 2021-10-01 NOTE — Telephone Encounter (Signed)
Attempted to reach patient to schedule procedure in office, left message to return call. Clearance note started

## 2021-10-09 NOTE — Telephone Encounter (Signed)
Clearance note received (scanned under media) ok for patient to hold Eliquis 72 hours prior to procedure. Patient and wife notified and verbalized understanding

## 2021-10-13 ENCOUNTER — Other Ambulatory Visit: Payer: Self-pay | Admitting: Urology

## 2021-10-13 ENCOUNTER — Other Ambulatory Visit: Payer: Self-pay | Admitting: Psychiatry

## 2021-10-13 DIAGNOSIS — F3342 Major depressive disorder, recurrent, in full remission: Secondary | ICD-10-CM

## 2021-10-14 ENCOUNTER — Ambulatory Visit: Payer: Medicare Other | Admitting: Urology

## 2021-10-21 ENCOUNTER — Ambulatory Visit (INDEPENDENT_AMBULATORY_CARE_PROVIDER_SITE_OTHER): Payer: Medicare Other | Admitting: Urology

## 2021-10-21 ENCOUNTER — Encounter: Payer: Self-pay | Admitting: Urology

## 2021-10-21 VITALS — BP 131/71 | HR 73 | Ht 76.0 in | Wt 240.0 lb

## 2021-10-21 DIAGNOSIS — N471 Phimosis: Secondary | ICD-10-CM | POA: Diagnosis not present

## 2021-10-21 NOTE — Progress Notes (Signed)
   10/22/21  CC:  Chief Complaint  Patient presents with   Other    HPI: Mark [name]. is a 81 y.o. male with a personal history of high risk Hematuria BPH with LUTS and phimosis.  He was a former smoker and had no evidence of disease on cystoscopy and contrast CT in 2021.   He was recently seen by his primary care doctor with a complaint of Dysuria and frequency on 09/11/2021. His UA was likely contaminated as he has severe phimosis. His urine culture was positive for two organisms and he was placed on doxycycline.  He was tried on steroid cream for his phimosis with no improvement.    Vitals:   10/21/21 1608  BP: 131/71  Pulse: 73   NED. A&Ox3.   No respiratory distress   Abd soft, NT, ND Tight phimosis, unable to reduce with inflamed foreskin  Procedure:  Informed consistent obtained.  Risks discussed.  Timeout was performed.  He was then prepped and draped in a sterile fashion.    Pre-Procedure: -A dorsal penile and ring block were administered using 20 cc 1% lidocaine.  The surgical site was then tested and adequate anesthesia was achieved.    Procedure: - Forceps were used to stretch the phimotic foreskin  - I then performed a dorsal slit by using a straight clamp to crush to the dorsal foreskin and then tissue scissors to incise this; I did this several times until I was able to ultimately reduce the foreskin and expose the glans.   - Bleeding points were cauterized and adequate hemostasis was achieved - Skin margins were reapproximated with interrupted 3-0 chromic catgut sutures. -Bacitracin was applied along with conform and Coban   Post-Procedure: -RTC in 2 weeks for wound check

## 2021-10-21 NOTE — Patient Instructions (Signed)
Care After The following information offers guidance on how to care for yourself after your procedure. Your health care provider may also give you more specific instructions. If you have problems or questions, contact your health care provider. What can I expect after the procedure? After the procedure, it is common to have redness, swelling, and soreness in the incision area. Follow these instructions at home: Medicines Take or apply over-the-counter and prescription medicines only as told by your health care provider. If you were prescribed an antibiotic medicine, use it as told by your health care provider. Do not stop using the antibiotic even if you start to feel better. Bathing Do not get your incision area wet for 24 hours after the procedure or as told by your health care provider. Do not take baths, swim, or use a hot tub until your health care provider approves. Ask your health care provider if you may take showers. You may only be allowed to take sponge baths. When you can shower, do not rub the incision area. Gently pat it dry. Incision care  Follow instructions from your health care provider about how to take care of your incision. Make sure you: Wash your hands with soap and water for at least 20 seconds before and after you change your bandage (dressing). If soap and water are not available, use hand sanitizer. Change your dressing as told by your health care provider. Leave stitches (sutures) in place. They will be absorbed over time. Check your incision area every day for signs of infection. Check for: More redness, swelling, or pain. Warmth. More fluid or blood. Pus or a bad smell. Managing pain and swelling If directed, put ice on the painful area. To do this: Put ice in a plastic bag. Place a towel between your skin and the bag. Leave the ice on for 20 minutes, 2-3 times a day. Remove the ice if your skin turns bright red. This is very important. If you cannot feel  pain, heat, or cold, you have a greater risk of damage to the area.  Activity  If you were given a sedative during the procedure, it can affect you for several hours. Do not drive or operate machinery until your health care provider says that it is safe. You may have to avoid lifting. Ask your health care provider how much you can safely lift. Do not have sex until your health care provider approves. Rest as told by your health care provider. Return to your normal activities as told by your health care provider. Ask your health care provider what activities are safe for you. Avoid contact sports and biking until your health care provider approves. General instructions Do not use any products that contain nicotine or tobacco. These products include cigarettes, chewing tobacco, and vaping devices, such as e-cigarettes. These can delay incision healing after surgery. If you need help quitting, ask your health care provider. Drink enough fluid to keep your urine pale yellow. Keep all follow-up visits. This is important. Contact a health care provider if: You have a fever. Medicine does not help your pain. You have more redness, swelling, or pain around your incision. Your incision feels warm to the touch. You have more fluid or blood coming from your incision. You have pus or a bad smell coming from your incision. Your incision breaks open. Get help right away if: You cannot urinate, or you have pain when you urinate. Redness, swelling, and pain spread up to the shaft of your penis,  thighs, or lower abdomen. You have bleeding that does not stop when you apply pressure. Summary After the procedure, it is common to have redness, swelling, and soreness around the incision area. Follow instructions from your health care provider about how to take care of your incision. Check your incision area every day for signs of infection. Do not have sex until your health care provider approves. This  information is not intended to replace advice given to you by your health care provider. Make sure you discuss any questions you have with your health care provider. Document Revised: 03/10/2021 Document Reviewed: 03/10/2021 Elsevier Patient Education  [place].

## 2021-10-28 ENCOUNTER — Other Ambulatory Visit: Payer: Self-pay | Admitting: Psychiatry

## 2021-10-28 DIAGNOSIS — F3342 Major depressive disorder, recurrent, in full remission: Secondary | ICD-10-CM

## 2021-11-04 ENCOUNTER — Ambulatory Visit (INDEPENDENT_AMBULATORY_CARE_PROVIDER_SITE_OTHER): Payer: Medicare Other | Admitting: Physician Assistant

## 2021-11-04 ENCOUNTER — Encounter: Payer: Self-pay | Admitting: Physician Assistant

## 2021-11-04 ENCOUNTER — Ambulatory Visit: Payer: Medicare Other | Admitting: Physician Assistant

## 2021-11-04 VITALS — BP 112/72 | HR 80 | Ht 76.0 in | Wt 240.0 lb

## 2021-11-04 DIAGNOSIS — N471 Phimosis: Secondary | ICD-10-CM | POA: Diagnosis not present

## 2021-11-04 DIAGNOSIS — N476 Balanoposthitis: Secondary | ICD-10-CM

## 2021-11-04 MED ORDER — CLOTRIMAZOLE 1 % EX OINT: TOPICAL_OINTMENT | CUTANEOUS | 0 refills | Status: DC

## 2021-11-04 NOTE — Progress Notes (Signed)
11/04/2021 3:08 PM   Mark [name]. 26-Mar-1940 [id number]  CC: Chief Complaint  Patient presents with   Other   HPI: Mark [name]. is a 81 y.o. male with PMH BPH with LUTS, hematuria with benign workup in 2021, and phimosis who underwent dorsal slit with Dr. Erlene Quan on 10/21/2021 who presents today for wound check.   Today he reports some burning/stinging at the end of his penis last week. He was seen by his PCP and started on Bactrim for possible UTI, but culture was negative.  PMH: Past Medical History:  Diagnosis Date   Anxiety    Atrial fibrillation (HCC)    Back pain, chronic    Cancer (HCC)    skin   Colon polyp    Cyst of left kidney    GERD (gastroesophageal reflux disease)    Heart murmur    Hyperlipemia    Mitral valve prolapse    Neuromuscular disorder (HCC)    Neuropathy    Osteoarthritis    Pulmonary emboli (HCC)    Spinal stenosis    Thyroid nodule    Vertigo     Surgical History: Past Surgical History:  Procedure Laterality Date   APPENDECTOMY     BACK SURGERY     COLONOSCOPY N/A 08/15/2014   Procedure: COLONOSCOPY;  Surgeon: [name], MD;  Location: [place];  Service: Endoscopy;  Laterality: N/A;   ESOPHAGOGASTRODUODENOSCOPY N/A 08/15/2014   Procedure: ESOPHAGOGASTRODUODENOSCOPY (EGD);  Surgeon: [name], MD;  Location: [facility] ENDOSCOPY;  Service: Endoscopy;  Laterality: N/A;   TONSILLECTOMY      Home Medications:  Allergies as of 11/04/2021       Reactions   Ciprofloxacin Other (See Comments)   Erythromycin Swelling   Keflex [cephalexin] Swelling   Penicillins Swelling   Has patient had a PCN reaction causing immediate rash, facial/tongue/throat swelling, SOB or lightheadedness with hypotension: Yes Has patient had a PCN reaction causing severe rash involving mucus membranes or skin necrosis: No Has patient had a PCN reaction that required hospitalization: Unknown Has patient had a PCN reaction occurring within the  last 10 years: No If all of the above answers are "NO", then may proceed with Cephalosporin use.        Medication List        Accurate as of November 04, 2021  3:08 PM. If you have any questions, ask your nurse or doctor.          apixaban 2.5 MG Tabs tablet Commonly known as: ELIQUIS Take 2.5 mg by mouth 2 (two) times daily.   B-12 5000 MCG Caps Take 5,000 mg by mouth daily. What changed: how much to take   betamethasone dipropionate 0.05 % cream Apply topically 2 (two) times daily.   buPROPion 75 MG tablet Commonly known as: WELLBUTRIN TAKE ONE TABLET (75 MG) BY MOUTH EVERY DAY WITH BREAKFAST   Cholecalciferol 25 MCG (1000 UT) tablet Take by mouth.   Clotrimazole 1 % Oint Apply twice daily x7 days. Started by: Debroah Loop, PA-C   CO Q 10 PO Take 1 Dose by mouth daily.   Cranberry 200 MG Caps Take 200 mg by mouth daily.   esomeprazole 40 MG capsule Commonly known as: NEXIUM Take 40 mg by mouth daily.   finasteride 5 MG tablet Commonly known as: PROSCAR TAKE 1 TABLET BY MOUTH DAILY   FLUoxetine 20 MG capsule Commonly known as: PROZAC TAKE 1 CAPSULE BY MOUTH ONCE DAILY. TO BE COMBINED  WITH '40MG'$ .   FLUoxetine 40 MG capsule Commonly known as: PROZAC TAKE 1 CAPSULE (40 MG) BY MOUTH EVERY DAY - COMBINE WITH 20 MG = 60 MG TOTAL   meclizine 25 MG tablet Commonly known as: ANTIVERT Take 1 tablet (25 mg total) by mouth 3 (three) times daily as needed for dizziness.   Movantik 12.5 MG Tabs tablet Generic drug: naloxegol oxalate Take 12.5 mg by mouth daily.   ondansetron 4 MG tablet Commonly known as: Zofran Take 1 tablet (4 mg total) by mouth every 8 (eight) hours as needed for up to 10 doses for nausea or vomiting.   simvastatin 40 MG tablet Commonly known as: ZOCOR Take 40 mg by mouth at bedtime.   sulfamethoxazole-trimethoprim 800-160 MG tablet Commonly known as: BACTRIM DS Take 1 tablet by mouth 2 (two) times daily.   tamsulosin 0.4  MG Caps capsule Commonly known as: FLOMAX Take 1 capsule (0.4 mg total) by mouth daily.   traMADol 50 MG tablet Commonly known as: Ultram Take 1 tablet (50 mg total) by mouth every 6 (six) hours as needed.   Vitamin B Complex Tabs Take by mouth.   VITAMIN D PO Take by mouth daily.        Allergies:  Allergies  Allergen Reactions   Ciprofloxacin Other (See Comments)   Erythromycin Swelling   Keflex [Cephalexin] Swelling   Penicillins Swelling    Has patient had a PCN reaction causing immediate rash, facial/tongue/throat swelling, SOB or lightheadedness with hypotension: Yes Has patient had a PCN reaction causing severe rash involving mucus membranes or skin necrosis: No Has patient had a PCN reaction that required hospitalization: Unknown Has patient had a PCN reaction occurring within the last 10 years: No If all of the above answers are "NO", then may proceed with Cephalosporin use.     Family History: Family History  Problem Relation Age of Onset   Stroke Mother    COPD Father     Social History:   reports that he quit smoking about 63 years ago. His smoking use included cigarettes. He has never used smokeless tobacco. He reports current alcohol use of about 1.0 standard drink of alcohol per week. He reports that he does not use drugs.  Physical Exam: BP 112/72   Pulse 80   Ht '6\' 4"'$  (1.93 m)   Wt 240 lb (108.9 kg)   BMI 29.21 kg/m   Constitutional:  Alert and oriented, no acute distress, nontoxic appearing HEENT: [place], AT Cardiovascular: No clubbing, cyanosis, or edema Respiratory: Normal respiratory effort, no increased work of breathing GU: Edematous foreskin. Now able to visualize the urethral meatus, but still unable to retract the foreskin behind the glans. White plaques on the distal foreskin and beefy red glans penis consistent with balanitis. Skin: No rashes, bruises or suspicious lesions Neurologic: Grossly intact, no focal deficits, moving all 4  extremities Psychiatric: Normal mood and affect  Assessment & Plan:   1. Phimosis Well healing foreskin s/p dorsal slit. There is some anticipated, persistent edema; foreskin may gain mobility as this subsides. We discussed that his urine sample with his PCP last week was likely contaminated in the setting of his edema; ok to stop antibiotics for UTI given diagnosis as below.  2. Balanoposthitis Beefy red glans and white patches on the foreskin consistent with yeast balanoposthitis, will treat with topical clotrimazole x7-14 days. - Clotrimazole 1 % OINT; Apply twice daily x7 days.  Dispense: 56.7 g; Refill: 0  No follow-ups on file.  Debroah Loop, PA-C  [facility] [address], [place], [place] [address] [phone]

## 2021-12-24 ENCOUNTER — Encounter: Payer: Self-pay | Admitting: Urology

## 2021-12-24 ENCOUNTER — Ambulatory Visit (INDEPENDENT_AMBULATORY_CARE_PROVIDER_SITE_OTHER): Payer: Medicare Other | Admitting: Urology

## 2021-12-24 ENCOUNTER — Ambulatory Visit: Payer: Medicare Other | Admitting: Urology

## 2021-12-24 VITALS — BP 102/67 | HR 73 | Ht 76.0 in

## 2021-12-24 DIAGNOSIS — R350 Frequency of micturition: Secondary | ICD-10-CM

## 2021-12-24 DIAGNOSIS — N401 Enlarged prostate with lower urinary tract symptoms: Secondary | ICD-10-CM | POA: Diagnosis not present

## 2021-12-24 DIAGNOSIS — R319 Hematuria, unspecified: Secondary | ICD-10-CM

## 2021-12-24 LAB — BLADDER SCAN AMB NON-IMAGING

## 2021-12-24 MED ORDER — FINASTERIDE 5 MG PO TABS: 5.0000 mg | ORAL_TABLET | Freq: Every day | ORAL | 3 refills | Status: DC

## 2021-12-24 MED ORDER — TAMSULOSIN HCL 0.4 MG PO CAPS: 0.4000 mg | ORAL_CAPSULE | Freq: Every day | ORAL | 3 refills | Status: AC

## 2021-12-24 NOTE — Progress Notes (Unsigned)
12/24/2021 4:16 PM   Mark [name]. [birth year] [id number]  Referring provider: Tracie Harrier, MD [address] [facility] [place],  [place] [address]  Chief Complaint  Patient presents with   Benign Prostatic Hypertrophy    HPI: 81 yo M with BPH with LUTS, h/o hematuria s/p eval 2021 who returns for routine annual f/u.  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  He feels like he doing very well, no urinary issues.    PSa 0.23 on 8/23  PVR 0    IPSS     Row Name 12/24/21 1600         International Prostate Symptom Score   How often have you had the sensation of not emptying your bladder? Not at All     How often have you had to urinate less than every two hours? Not at All     How often have you found you stopped and started again several times when you urinated? Not at All     How often have you found it difficult to postpone urination? Not at All     How often have you had a weak urinary stream? Not at All     How often have you had to strain to start urination? Not at All     How many times did you typically get up at night to urinate? None     Total IPSS Score 0       Quality of Life due to urinary symptoms   If you were to spend the rest of your life with your urinary condition just the way it is now how would you feel about that? Mostly Satisfied              Score:  1-7 Mild 8-19 Moderate 20-35 Severe    PMH: Past Medical History:  Diagnosis Date   Anxiety    Atrial fibrillation (HCC)    Back pain, chronic    Cancer (HCC)    skin   Colon polyp    Cyst of left kidney    GERD (gastroesophageal reflux disease)    Heart murmur    Hyperlipemia    Mitral valve prolapse    Neuromuscular disorder (HCC)    Neuropathy    Osteoarthritis    Pulmonary emboli (HCC)    Spinal stenosis    Thyroid nodule    Vertigo     Surgical History: Past Surgical  History:  Procedure Laterality Date   APPENDECTOMY     BACK SURGERY     COLONOSCOPY N/A 08/15/2014   Procedure: COLONOSCOPY;  Surgeon: [name], MD;  Location: [place];  Service: Endoscopy;  Laterality: N/A;   ESOPHAGOGASTRODUODENOSCOPY N/A 08/15/2014   Procedure: ESOPHAGOGASTRODUODENOSCOPY (EGD);  Surgeon: [name], MD;  Location: [facility] ENDOSCOPY;  Service: Endoscopy;  Laterality: N/A;   TONSILLECTOMY      Home Medications:  Allergies as of 12/24/2021       Reactions   Ciprofloxacin Other (See Comments)   Erythromycin Swelling   Keflex [cephalexin] Swelling   Penicillins Swelling   Has patient had a PCN reaction causing immediate rash, facial/tongue/throat swelling, SOB or lightheadedness with hypotension: Yes Has patient had a PCN reaction causing severe rash involving mucus membranes or skin necrosis: No Has patient had a PCN reaction that required hospitalization: Unknown Has patient had a PCN reaction occurring within the last 10 years:  No If all of the above answers are "NO", then may proceed with Cephalosporin use.        Medication List        Accurate as of December 24, 2021  4:16 PM. If you have any questions, ask your nurse or doctor.          apixaban 2.5 MG Tabs tablet Commonly known as: ELIQUIS Take 2.5 mg by mouth 2 (two) times daily.   B-12 5000 MCG Caps Take 5,000 mg by mouth daily. What changed: how much to take   betamethasone dipropionate 0.05 % cream Apply topically 2 (two) times daily.   buPROPion 75 MG tablet Commonly known as: WELLBUTRIN TAKE ONE TABLET (75 MG) BY MOUTH EVERY DAY WITH BREAKFAST   Cholecalciferol 25 MCG (1000 UT) tablet Take by mouth.   Clotrimazole 1 % Oint Apply twice daily x7 days.   CO Q 10 PO Take 1 Dose by mouth daily.   Cranberry 200 MG Caps Take 200 mg by mouth daily.   esomeprazole 40 MG capsule Commonly known as: NEXIUM Take 40 mg by mouth daily.   finasteride 5 MG tablet Commonly  known as: PROSCAR TAKE 1 TABLET BY MOUTH DAILY   FLUoxetine 20 MG capsule Commonly known as: PROZAC TAKE 1 CAPSULE BY MOUTH ONCE DAILY. TO BE COMBINED WITH '40MG'$ .   FLUoxetine 40 MG capsule Commonly known as: PROZAC TAKE 1 CAPSULE (40 MG) BY MOUTH EVERY DAY - COMBINE WITH 20 MG = 60 MG TOTAL   meclizine 25 MG tablet Commonly known as: ANTIVERT Take 1 tablet (25 mg total) by mouth 3 (three) times daily as needed for dizziness.   Movantik 12.5 MG Tabs tablet Generic drug: naloxegol oxalate Take 12.5 mg by mouth daily.   ondansetron 4 MG tablet Commonly known as: Zofran Take 1 tablet (4 mg total) by mouth every 8 (eight) hours as needed for up to 10 doses for nausea or vomiting.   simvastatin 40 MG tablet Commonly known as: ZOCOR Take 40 mg by mouth at bedtime.   sulfamethoxazole-trimethoprim 800-160 MG tablet Commonly known as: BACTRIM DS Take 1 tablet by mouth 2 (two) times daily.   tamsulosin 0.4 MG Caps capsule Commonly known as: FLOMAX Take 1 capsule (0.4 mg total) by mouth daily.   traMADol 50 MG tablet Commonly known as: Ultram Take 1 tablet (50 mg total) by mouth every 6 (six) hours as needed.   Vitamin B Complex Tabs Take by mouth.   VITAMIN D PO Take by mouth daily.        Allergies:  Allergies  Allergen Reactions   Ciprofloxacin Other (See Comments)   Erythromycin Swelling   Keflex [Cephalexin] Swelling   Penicillins Swelling    Has patient had a PCN reaction causing immediate rash, facial/tongue/throat swelling, SOB or lightheadedness with hypotension: Yes Has patient had a PCN reaction causing severe rash involving mucus membranes or skin necrosis: No Has patient had a PCN reaction that required hospitalization: Unknown Has patient had a PCN reaction occurring within the last 10 years: No If all of the above answers are "NO", then may proceed with Cephalosporin use.     Family History: Family History  Problem Relation Age of Onset   Stroke  Mother    COPD Father     Social History:  reports that he quit smoking about 63 years ago. His smoking use included cigarettes. He has never used smokeless tobacco. He reports current alcohol use of about 1.0 standard drink of  alcohol per week. He reports that he does not use drugs.   Physical Exam: There were no vitals taken for this visit.  Constitutional:  Alert and oriented, No acute distress. HEENT: [place] AT, moist mucus membranes.  Trachea midline, no masses. Cardiovascular: No clubbing, cyanosis, or edema. Respiratory: Normal respiratory effort, no increased work of breathing. GI: Abdomen is soft, nontender, nondistended, no abdominal masses GU: No CVA tenderness Skin: No rashes, bruises or suspicious lesions. Neurologic: Grossly intact, no focal deficits, moving all 4 extremities. Psychiatric: Normal mood and affect.  Laboratory Data: Lab Results  Component Value Date   WBC 7.7 12/01/2020   HGB 15.1 12/01/2020   HCT 44.0 12/01/2020   MCV 92.2 12/01/2020   PLT 236 12/01/2020    Lab Results  Component Value Date   CREATININE 1.04 12/01/2020    No results found for: "PSA"  No results found for: "TESTOSTERONE"  No results found for: "HGBA1C"  Urinalysis    Component Value Date/Time   COLORURINE YELLOW 12/01/2020 1352   APPEARANCEUR Clear 09/25/2021 1020   LABSPEC 1.020 12/01/2020 1352   PHURINE 8.0 12/01/2020 1352   GLUCOSEU Negative 09/25/2021 1020   HGBUR NEGATIVE 12/01/2020 1352   BILIRUBINUR Negative 09/25/2021 1020   KETONESUR NEGATIVE 12/01/2020 1352   PROTEINUR Negative 09/25/2021 1020   PROTEINUR NEGATIVE 12/01/2020 1352   NITRITE Negative 09/25/2021 1020   NITRITE NEGATIVE 12/01/2020 1352   LEUKOCYTESUR Negative 09/25/2021 1020   LEUKOCYTESUR SMALL (A) 12/01/2020 1352    Lab Results  Component Value Date   LABMICR See below: 09/25/2021   WBCUA 0-5 09/25/2021   RBCUA None seen 05/05/2018   LABEPIT 0-10 09/25/2021   MUCUS Present (A)  09/25/2021   BACTERIA Few 09/25/2021    Pertinent Imaging: *** No results found for this or any previous visit.  No results found for this or any previous visit.  No results found for this or any previous visit.  No results found for this or any previous visit.  No results found for this or any previous visit.  No valid procedures specified. No results found for this or any previous visit.  No results found for this or any previous visit.   Assessment & Plan:    1. Benign prostatic hyperplasia with urinary frequency *** - Bladder Scan (Post Void Residual) in office - Urinalysis, Complete  2. Hematuria, unspecified type *** - Bladder Scan (Post Void Residual) in office - Urinalysis, Complete   No follow-ups on file.  Hollice Espy, MD  [facility] [address], [place], [place] [address] [phone]

## 2022-01-08 ENCOUNTER — Other Ambulatory Visit: Payer: Self-pay | Admitting: Family Medicine

## 2022-01-08 DIAGNOSIS — R634 Abnormal weight loss: Secondary | ICD-10-CM

## 2022-01-08 DIAGNOSIS — R110 Nausea: Secondary | ICD-10-CM

## 2022-01-16 ENCOUNTER — Emergency Department: Payer: Medicare Other

## 2022-01-16 ENCOUNTER — Encounter: Payer: Self-pay | Admitting: *Deleted

## 2022-01-16 ENCOUNTER — Emergency Department
Admission: EM | Admit: 2022-01-16 | Discharge: 2022-01-16 | Disposition: A | Discharge status: Home / Self Care | Payer: Medicare Other | Attending: Student in an Organized Health Care Education/Training Program | Admitting: Student in an Organized Health Care Education/Training Program

## 2022-01-16 ENCOUNTER — Other Ambulatory Visit: Payer: Self-pay

## 2022-01-16 DIAGNOSIS — S0990XA Unspecified injury of head, initial encounter: Secondary | ICD-10-CM | POA: Diagnosis present

## 2022-01-16 DIAGNOSIS — W19XXXA Unspecified fall, initial encounter: Secondary | ICD-10-CM

## 2022-01-16 DIAGNOSIS — W06XXXA Fall from bed, initial encounter: Secondary | ICD-10-CM | POA: Diagnosis not present

## 2022-01-16 DIAGNOSIS — Z7901 Long term (current) use of anticoagulants: Secondary | ICD-10-CM | POA: Insufficient documentation

## 2022-01-16 DIAGNOSIS — N3000 Acute cystitis without hematuria: Secondary | ICD-10-CM | POA: Insufficient documentation

## 2022-01-16 LAB — CBC WITH DIFFERENTIAL/PLATELET
Abs Immature Granulocytes: 0.04 10*3/uL (ref 0.00–0.07)
Basophils Absolute: 0 10*3/uL (ref 0.0–0.1)
Basophils Relative: 1 %
Eosinophils Absolute: 0.2 10*3/uL (ref 0.0–0.5)
Eosinophils Relative: 2 %
HCT: 43.6 % (ref 39.0–52.0)
Hemoglobin: 14.3 g/dL (ref 13.0–17.0)
Immature Granulocytes: 1 %
Lymphocytes Relative: 14 %
Lymphs Abs: 1.2 10*3/uL (ref 0.7–4.0)
MCH: 30.6 pg (ref 26.0–34.0)
MCHC: 32.8 g/dL (ref 30.0–36.0)
MCV: 93.2 fL (ref 80.0–100.0)
Monocytes Absolute: 0.8 10*3/uL (ref 0.1–1.0)
Monocytes Relative: 10 %
Neutro Abs: 6.4 10*3/uL (ref 1.7–7.7)
Neutrophils Relative %: 72 %
Platelets: 223 10*3/uL (ref 150–400)
RBC: 4.68 MIL/uL (ref 4.22–5.81)
RDW: 13.1 % (ref 11.5–15.5)
WBC: 8.7 10*3/uL (ref 4.0–10.5)
nRBC: 0 % (ref 0.0–0.2)

## 2022-01-16 LAB — BASIC METABOLIC PANEL
Anion gap: 8 (ref 5–15)
BUN: 14 mg/dL (ref 8–23)
CO2: 27 mmol/L (ref 22–32)
Calcium: 9 mg/dL (ref 8.9–10.3)
Chloride: 107 mmol/L (ref 98–111)
Creatinine, Ser: 0.91 mg/dL (ref 0.61–1.24)
GFR, Estimated: >60 mL/min (ref >60)
Glucose, Bld: 111 mg/dL — ABNORMAL HIGH (ref 70–99)
Potassium: 3.6 mmol/L (ref 3.5–5.1)
Sodium: 142 mmol/L (ref 135–145)

## 2022-01-16 MED ORDER — SULFAMETHOXAZOLE-TRIMETHOPRIM 800-160 MG PO TABS: 1.0000 | ORAL_TABLET | Freq: Two times a day (BID) | ORAL | 0 refills | Status: AC

## 2022-01-16 NOTE — ED Triage Notes (Addendum)
Per EMT report, patient had a witnessed fall today. Patient slid out of bed onto the hardwood floor and hit head on night stand. Patient is on Eliquis. Patient is at baseline per EMT report. Patient c/o headache. Patient has garbled speech at baseline. Patient was told yesterday he has a UTI, but is not currently on ABX.

## 2022-01-16 NOTE — ED Provider Notes (Signed)
[facility] Provider Note    Event Date/Time   First MD Initiated Contact with Patient 01/16/22 1212     (approximate)   History   Fall   HPI  Mark Guia. is a 81 y.o. male who presents to the ER for evaluation of minor head injury occurred after witnessed mechanical fall and getting out of bed.  He denies any chest pain or palpitations no lightheadedness or dizziness no weakness.  He is on Eliquis.  Denies any chest pain or pressure.  Does have abrasion to the right parietal scalp.     Physical Exam   Triage Vital Signs: ED Triage Vitals  Enc Vitals Group     BP 01/16/22 1214 (!) 147/73     Pulse Rate 01/16/22 1214 82     Resp 01/16/22 1214 18     Temp 01/16/22 1214 (!) 97.4 F (36.3 C)     Temp Source 01/16/22 1214 Oral     SpO2 01/16/22 1214 94 %     Weight 01/16/22 1214 240 lb (108.9 kg)     Height 01/16/22 1214 '6\' 4"'$  (1.93 m)     Head Circumference --      Peak Flow --      Pain Score 01/16/22 1214 2     Pain Loc --      Pain Edu? --      Excl. in [place]? --     Most recent vital signs: Vitals:   01/16/22 1214  BP: (!) 147/73  Pulse: 82  Resp: 18  Temp: (!) 97.4 F (36.3 C)  SpO2: 94%     Constitutional: Alert  Eyes: Conjunctivae are normal.  Head: Superficial abrasion to the right parietal scalp Nose: No congestion/rhinnorhea. Mouth/Throat: Mucous membranes are moist.   Neck: no step offs Cardiovascular:   Good peripheral circulation. Respiratory: Normal respiratory effort.  No retractions.  Gastrointestinal: Soft and nontender.  Musculoskeletal:  no deformity Neurologic:  MAE spontaneously. No gross focal neurologic deficits are appreciated.  Skin:  Skin is warm, dry and intact. No rash noted. Psychiatric: Mood and affect are normal. Speech and behavior are normal.    ED Results / Procedures / Treatments   Labs (all labs ordered are listed, but only abnormal results are displayed) Labs Reviewed  BASIC METABOLIC  PANEL - Abnormal; Notable for the following components:      Result Value   Glucose, Bld 111 (*)    All other components within normal limits  URINE CULTURE  CBC WITH DIFFERENTIAL/PLATELET  URINALYSIS, ROUTINE W REFLEX MICROSCOPIC     EKG     RADIOLOGY Please see ED Course for my review and interpretation.  I personally reviewed all radiographic images ordered to evaluate for the above acute complaints and reviewed radiology reports and findings.  These findings were personally discussed with the patient.  Please see medical record for radiology report.    PROCEDURES:  Critical Care performed: No  Procedures   MEDICATIONS ORDERED IN ED: Medications - No data to display   IMPRESSION / MDM / [place] / ED COURSE  I reviewed the triage vital signs and the nursing notes.                              Differential diagnosis includes, but is not limited to, SDH, IPH, laceration, abrasion, fracture, contusion  Patient presented to the ER for evaluation of fall and  injury as described above.  Imaging will be ordered based on his age and risk factors.  Wound care will be provided.   Clinical Course as of 01/16/22 1705  Fri Jan 16, 2022  1357 CT on my review and interpretation does not show any evidence of hemorrhage or SDH. [PR]  1428 Family now at bedside stated the patient was diagnosed with urinalysis yesterday neurology clinic due to going there for worsening tremors.  Renal function was not checked.  Has not been put on any antibiotics.  No reported fevers.  Will check labs as well as urine. [PR]  1529 Blood work is normal.  On review of care everywhere his urine did not appear consistent with UTI.  Has multiple allergies to antibiotics but has tolerated Bactrim in the past.  Discussed importance of drinking fluids.  At this point do believe he stable and appropriate for outpatient follow-up.  Family agree with plan. [PR]    Clinical Course User Index [PR]  Merlyn Lot, MD      FINAL CLINICAL IMPRESSION(S) / ED DIAGNOSES   Final diagnoses:  Fall, initial encounter  Minor head injury, initial encounter  Acute cystitis without hematuria     Rx / DC Orders   ED Discharge Orders          Ordered    sulfamethoxazole-trimethoprim (BACTRIM DS) 800-160 MG tablet  2 times daily        01/16/22 1528             Note:  This document was prepared using Dragon voice recognition software and may include unintentional dictation errors.    Merlyn Lot, MD 01/16/22 1705

## 2022-01-19 ENCOUNTER — Other Ambulatory Visit: Payer: Self-pay | Admitting: Physician Assistant

## 2022-01-19 DIAGNOSIS — G479 Sleep disorder, unspecified: Secondary | ICD-10-CM

## 2022-01-19 DIAGNOSIS — R410 Disorientation, unspecified: Secondary | ICD-10-CM

## 2022-01-19 DIAGNOSIS — R296 Repeated falls: Secondary | ICD-10-CM

## 2022-01-19 DIAGNOSIS — R479 Unspecified speech disturbances: Secondary | ICD-10-CM

## 2022-01-19 DIAGNOSIS — R443 Hallucinations, unspecified: Secondary | ICD-10-CM

## 2022-01-19 DIAGNOSIS — R270 Ataxia, unspecified: Secondary | ICD-10-CM

## 2022-01-20 ENCOUNTER — Encounter: Payer: Self-pay | Admitting: Otolaryngology

## 2022-01-20 ENCOUNTER — Ambulatory Visit
Admission: RE | Admit: 2022-01-20 | Discharge: 2022-01-20 | Disposition: A | Discharge status: Home / Self Care | Payer: Medicare Other | Source: Ambulatory Visit | Attending: Family Medicine | Admitting: Family Medicine

## 2022-01-20 DIAGNOSIS — R634 Abnormal weight loss: Secondary | ICD-10-CM

## 2022-01-20 DIAGNOSIS — R110 Nausea: Secondary | ICD-10-CM

## 2022-01-20 MED ORDER — IOPAMIDOL (ISOVUE-300) INJECTION 61%
100.0000 mL | Freq: Once | INTRAVENOUS | Status: AC | PRN
  Administered 2022-01-20: 100 mL via INTRAVENOUS

## 2022-01-22 ENCOUNTER — Ambulatory Visit
Admission: RE | Admit: 2022-01-22 | Discharge: 2022-01-22 | Disposition: A | Discharge status: Home / Self Care | Payer: Medicare Other | Source: Ambulatory Visit | Attending: Psychiatry | Admitting: Psychiatry

## 2022-01-22 ENCOUNTER — Ambulatory Visit (INDEPENDENT_AMBULATORY_CARE_PROVIDER_SITE_OTHER): Payer: Medicare Other | Admitting: Psychiatry

## 2022-01-22 ENCOUNTER — Encounter: Payer: Self-pay | Admitting: Psychiatry

## 2022-01-22 VITALS — BP 124/72 | HR 92 | Temp 98.2°F | Ht 76.0 in

## 2022-01-22 DIAGNOSIS — Z9189 Other specified personal risk factors, not elsewhere classified: Secondary | ICD-10-CM

## 2022-01-22 DIAGNOSIS — F3342 Major depressive disorder, recurrent, in full remission: Secondary | ICD-10-CM | POA: Diagnosis not present

## 2022-01-22 DIAGNOSIS — F29 Unspecified psychosis not due to a substance or known physiological condition: Secondary | ICD-10-CM | POA: Insufficient documentation

## 2022-01-22 DIAGNOSIS — G4701 Insomnia due to medical condition: Secondary | ICD-10-CM | POA: Diagnosis not present

## 2022-01-22 DIAGNOSIS — G3184 Mild cognitive impairment, so stated: Secondary | ICD-10-CM

## 2022-01-22 MED ORDER — FLUOXETINE HCL 40 MG PO CAPS: ORAL_CAPSULE | ORAL | 1 refills | Status: DC

## 2022-01-22 MED ORDER — FLUOXETINE HCL 20 MG PO CAPS: ORAL_CAPSULE | ORAL | 1 refills | Status: DC

## 2022-01-22 NOTE — Progress Notes (Signed)
[facility] MD OP Progress Note  01/22/2022 5:15 PM Mark [name].  MRN:  [id number]  Chief Complaint:  Chief Complaint  Patient presents with   Follow-up   Hallucinations   Anxiety   Medication Refill   HPI: Mark [name] Jr.is a 81 year old Caucasian male, lives in [place], has a history of MDD, insomnia, pneumothorax, history of syncopal episodes, BPH, multiple other medical problems including restless leg syndrome, obstructive sleep apnea noncompliant with CPAP, memory problems, presented for medication management.  Patient presented with his wife-Mark [name].  Wife provided collateral information.  Patient has had multiple falls in the past few months.  Patient recently had another fall and was seen in the emergency department-reviewed notes-01/16/2022-per Dr. Merilynn Finland had CT scan which did not show any evidence of hemorrhage or SDH.  Patient was diagnosed with fall, minor head injury, acute cystitis without hematorrhea and was discharged on sulfamethoxazole-trimethoprim.'  Per wife patient has been hallucinating, has visual hallucination of something in the air which he tries to pull out, also has been acting out in his sleep ,talking out in his sleep and talking to self, as though he is responding to something.  This has been going on since the past couple of weeks.  Patient does not have any symptoms of urinary tract infection although he is currently taking the Bactrim.  Per wife patient does appear to have some word-finding difficulty.  Otherwise he has been doing okay.    Patient today appeared to be alert, oriented to person place time situation.  3 word memory immediately 3 out of 3, after 5 minutes 2 out of 3.  Patient was able to do calculation well patient was able to draw a clock well with the correct time.  Patient did appear initially to have some problems with his speech, as though he is slurred at some words, however as he continued talking it did not seem to persist, speech  appeared to be normal.  Patient was able to answer all questions appropriately and did not appear to be confused in session today.  Patient was in a wheelchair, did not walk during the session.  Patient as well as wife reports some gait disturbances in the past few weeks with multiple falls.  Has upcoming appointment with neurology, scheduled for MRI of the brain next Friday.  Currently compliant on her medications, denies side effects.  Denies any other concerns today.  Visit Diagnosis:    ICD-10-CM   1. MDD (major depressive disorder), recurrent, in full remission ([place])  F33.42 FLUoxetine (PROZAC) 20 MG capsule    FLUoxetine (PROZAC) 40 MG capsule    2. Psychosis, unspecified psychosis type ([place])  F29 EKG 12-Lead   R/O Delirium due to another medical condition - likely UTI    3. Insomnia due to medical condition  G47.01    nocturia    4. At risk for prolonged QT interval syndrome  Z91.89 EKG 12-Lead    5. MCI (mild cognitive impairment)  G31.84       Past Psychiatric History: Reviewed past psychiatric history from progress note on 04/20/2018.  Past trials of Prozac, Wellbutrin.  Past Medical History:  Past Medical History:  Diagnosis Date   Anxiety    Atrial fibrillation (HCC)    Back pain, chronic    Cancer (HCC)    skin   Colon polyp    Cyst of left kidney    GERD (gastroesophageal reflux disease)    Heart murmur    Hyperlipemia  Mitral valve prolapse    Neuromuscular disorder (HCC)    Neuropathy    Osteoarthritis    Pulmonary emboli (HCC)    Spinal stenosis    Thyroid nodule    Vertigo     Past Surgical History:  Procedure Laterality Date   APPENDECTOMY     BACK SURGERY     COLONOSCOPY N/A 08/15/2014   Procedure: COLONOSCOPY;  Surgeon: [name], MD;  Location: [facility];  Service: Endoscopy;  Laterality: N/A;   ESOPHAGOGASTRODUODENOSCOPY N/A 08/15/2014   Procedure: ESOPHAGOGASTRODUODENOSCOPY (EGD);  Surgeon: [name], MD;  Location:  [facility] ENDOSCOPY;  Service: Endoscopy;  Laterality: N/A;   TONSILLECTOMY      Family Psychiatric History: Reviewed family psychiatric history from progress note on 04/20/2018.  Family History:  Family History  Problem Relation Age of Onset   Stroke Mother    COPD Father     Social History: Reviewed social history from progress note on 04/20/2018. Social History   Socioeconomic History   Marital status: Married    Spouse name: Mark [name]   Number of children: 2   Years of education: Not on file   Highest education level: Master's degree (e.g., MA, MS, MEng, MEd, MSW, MBA)  Occupational History   Not on file  Tobacco Use   Smoking status: Former    Types: Cigarettes    Quit date: 04/20/1958    Years since quitting: 63.8   Smokeless tobacco: Never  Vaping Use   Vaping Use: Never used  Substance and Sexual Activity   Alcohol use: Yes    Alcohol/week: 1.0 standard drink of alcohol    Types: 1 Glasses of wine per week   Drug use: Never   Sexual activity: Not Currently  Other Topics Concern   Not on file  Social History Narrative   Not on file   Social Determinants of Health   Financial Resource Strain: Low Risk  (04/20/2018)   Overall Financial Resource Strain (CARDIA)    Difficulty of Paying Living Expenses: Not hard at all  Food Insecurity: No Food Insecurity (04/20/2018)   Hunger Vital Sign    Worried About Running Out of Food in the Last Year: Never true    Ran Out of Food in the Last Year: Never true  Transportation Needs: No Transportation Needs (04/20/2018)   PRAPARE - Hydrologist (Medical): No    Lack of Transportation (Non-Medical): No  Physical Activity: Insufficiently Active (04/20/2018)   Exercise Vital Sign    Days of Exercise per Week: 2 days    Minutes of Exercise per Session: 60 min  Stress: No Stress Concern Present (04/20/2018)   [place]    Feeling of Stress :  Not at all  Social Connections: Unknown (04/20/2018)   Social Connection and Isolation Panel [NHANES]    Frequency of Communication with Friends and Family: Not on file    Frequency of Social Gatherings with Friends and Family: Not on file    Attends Religious Services: More than 4 times per year    Active Member of [employer] or Organizations: Yes    Attends Archivist Meetings: More than 4 times per year    Marital Status: Married    Allergies:  Allergies  Allergen Reactions   Ciprofloxacin Other (See Comments)   Erythromycin Swelling   Keflex [Cephalexin] Swelling   Penicillins Swelling    Has patient had a PCN reaction causing immediate  rash, facial/tongue/throat swelling, SOB or lightheadedness with hypotension: Yes Has patient had a PCN reaction causing severe rash involving mucus membranes or skin necrosis: No Has patient had a PCN reaction that required hospitalization: Unknown Has patient had a PCN reaction occurring within the last 10 years: No If all of the above answers are "NO", then may proceed with Cephalosporin use.     Metabolic Disorder Labs: No results found for: "HGBA1C", "MPG" No results found for: "PROLACTIN" No results found for: "CHOL", "TRIG", "HDL", "CHOLHDL", "VLDL", "LDLCALC" Lab Results  Component Value Date   TSH 0.866 02/21/2018    Therapeutic Level Labs: No results found for: "LITHIUM" No results found for: "VALPROATE" No results found for: "CBMZ"  Current Medications: Current Outpatient Medications  Medication Sig Dispense Refill   apixaban (ELIQUIS) 2.5 MG TABS tablet Take 2.5 mg by mouth 2 (two) times daily.     B Complex Vitamins (VITAMIN B COMPLEX) TABS Take by mouth.     betamethasone dipropionate 0.05 % cream Apply topically 2 (two) times daily. 30 g 0   buPROPion (WELLBUTRIN XL) 150 MG 24 hr tablet Take 150 mg by mouth daily.     Cholecalciferol 25 MCG (1000 UT) tablet Take by mouth.     Clotrimazole 1 % OINT Apply twice  daily x7 days. 56.7 g 0   Coenzyme Q10 (CO Q 10 PO) Take 1 Dose by mouth daily.      Cranberry 200 MG CAPS Take 200 mg by mouth daily.     Cyanocobalamin (B-12) 5000 MCG CAPS Take 5,000 mg by mouth daily. (Patient taking differently: Take 5,000 mcg by mouth daily.) 30 capsule 0   donepezil (ARICEPT) 10 MG tablet Take 10 mg by mouth at bedtime.     esomeprazole (NEXIUM) 40 MG capsule Take 40 mg by mouth daily.     finasteride (PROSCAR) 5 MG tablet Take 1 tablet (5 mg total) by mouth daily. 90 tablet 3   meclizine (ANTIVERT) 25 MG tablet Take 1 tablet (25 mg total) by mouth 3 (three) times daily as needed for dizziness. 30 tablet 0   ondansetron (ZOFRAN-ODT) 4 MG disintegrating tablet Take 4 mg by mouth every 8 (eight) hours as needed.     simvastatin (ZOCOR) 40 MG tablet Take 40 mg by mouth at bedtime.      sucralfate (CARAFATE) 1 g tablet Take by mouth.     tamsulosin (FLOMAX) 0.4 MG CAPS capsule Take 1 capsule (0.4 mg total) by mouth daily. 90 capsule 3   traMADol (ULTRAM) 50 MG tablet Take 1 tablet (50 mg total) by mouth every 6 (six) hours as needed. 20 tablet 0   VITAMIN D PO Take by mouth daily.     FLUoxetine (PROZAC) 20 MG capsule TAKE 1 CAPSULE BY MOUTH ONCE DAILY. TO BE COMBINED WITH '40MG'$ . 90 capsule 1   FLUoxetine (PROZAC) 40 MG capsule TAKE 1 CAPSULE (40 MG) BY MOUTH EVERY DAY - COMBINE WITH 20 MG = 60 MG TOTAL 90 capsule 1   MOVANTIK 12.5 MG TABS tablet Take 12.5 mg by mouth daily. (Patient not taking: Reported on 01/22/2022)     Current Facility-Administered Medications  Medication Dose Route Frequency Provider Last Rate Last Admin   nystatin (MYCOSTATIN/NYSTOP) topical powder   Topical BID Hollice Espy, MD         Musculoskeletal: Strength & Muscle Tone:  UTA Gait & Station:  Wheelchair bound Patient leans: Backward  Psychiatric Specialty Exam: Review of Systems  Psychiatric/Behavioral:  Positive for  sleep disturbance. The patient is nervous/anxious.   All other  systems reviewed and are negative.   Blood pressure 124/72, pulse 92, temperature 98.2 F (36.8 C), temperature source Oral, height '6\' 4"'$  (1.93 m).Body mass index is 29.21 kg/m.  General Appearance: Casual  Eye Contact:  Good  Speech:  Normal Rate  Volume:  Decreased, appeared to be slurred with some words, but did not persist , got better as he continued to speak  Mood:  Anxious  Affect:  Full Range  Thought Process:  Goal Directed and Descriptions of Associations: Intact  Orientation:  Full (Time, Place, and Person)  Thought Content: Logical   Suicidal Thoughts:  No  Homicidal Thoughts:  No  Memory:  Immediate;   Fair Recent;   Fair Remote;   Fair  Judgement:  Fair  Insight:  Fair  Psychomotor Activity:  Normal  Concentration:  Concentration: Fair and Attention Span: Fair  Recall:  [employer] of Knowledge: Fair  Language: Fair  Akathisia:  No  Handed:  Right  AIMS (if indicated): not done  Assets:  Communication Skills Desire for Improvement Housing Intimacy Social Support Transportation  ADL's:  Intact  Cognition: WNL  Sleep:  Poor   Screenings: Camera operator [place] Office Visit from 07/21/2021 in [facility] Office Visit from 12/10/2020 in [facility]  PHQ-2 Total Score 0 0  PHQ-9 Total Score 0 --      [place] [place] Office Visit from 01/22/2022 in [facility] from 01/16/2022 in [facility] Office Visit from 12/10/2020 in [facility]  C-SSRS RISK CATEGORY No Risk No Risk No Risk        Assessment and Plan: MAHLIK LENN Jr.is a 81 year old Caucasian male, lives in [place], has a history of depression, BPH, history of falls, syncopal episodes, basal cell carcinoma, hyperlipidemia was evaluated in office today.  Patient with recent falls, hallucinations, currently on Bactrim like for acute cystitis,  likely contributing to his current sleep problems, psychosis, patient likely has delirium secondary to his acute cystitis, will benefit from the following plan.  Plan MDD in full remission Wellbutrin XL 150 mg p.o. daily in the morning-dose recently readjusted by his neurologist. Prozac 60 mg p.o. daily  Insomnia-unstable Likely due to delirium due to acute cystitis.  Currently on antibiotic. Will consider adding low dosage of Seroquel. Continue sleep hygiene techniques Noncompliant with CPAP  Psychosis unspecified-rule out delirium due to medical illness-acute cystitis-unstable Patient appeared to be alert oriented to person place time situation as noted above and did well on memory test. However due to his episodes of psychosis, sleep problems, will start low dosage of Seroquel 12.5-25 mg at bedtime.  Provided education including discussed risk of sudden death, black box warning in elderly patients with dementia. Will get an EKG prior to starting this medication.   At risk for prolonged QT syndrome-we will order EKG-advised to call [phone].  Collateral information obtained from wife as noted above.  Collateral information obtained from medical records-Dr. Florian Buff 01/16/2022.  Patient to continue follow-up with neurology-reviewed notes per Ms. Mason-dated 01/15/2022.  Patient scheduled for brain MRI.  Discussed referral to [facility] memory clinic.  Patient diagnosed with MCI-currently on Aricept.   Follow-up in clinic in 3 weeks or sooner if needed.  This note was generated in part or whole with voice recognition software. Voice recognition is usually quite accurate but there are transcription errors that can  and very often do occur. I apologize for any typographical errors that were not detected and corrected.       Ursula Alert, MD 01/23/2022, 10:27 AM

## 2022-01-22 NOTE — Patient Instructions (Signed)
Please call for EKG - [phone]   Quetiapine Tablets What is this medication? QUETIAPINE (kwe TYE a peen) treats schizophrenia and bipolar disorder. It works by balancing the levels of dopamine and serotonin in your brain, hormones that help regulate mood, behaviors, and thoughts. It belongs to a group of medications called antipsychotics. Antipsychotic medications can be used to treat several kinds of mental health conditions. This medicine may be used for other purposes; ask your health care provider or pharmacist if you have questions. COMMON BRAND NAME(S): Seroquel What should I tell my care team before I take this medication? They need to know if you have any of these conditions: Blockage in your bowels Cataracts Constipation Dementia Diabetes Difficulty swallowing Glaucoma Heart disease High levels of prolactin History of breast cancer History of irregular heartbeat Liver disease Low blood cell levels (white cells, red cells, and platelets) Low blood pressure Parkinson disease Prostate disease Seizures Suicidal thoughts, plans, or attempt by you or a family member Thyroid disease Trouble passing urine An unusual or allergic reaction to quetiapine, other medications, foods, dyes, or preservatives Pregnant or trying to get pregnant Breastfeeding How should I use this medication? Take this medication by mouth with water. Take it as directed on the prescription label at the same time every day. You can take it with or without food. If it upsets your stomach, take it with food. Keep taking it unless your care team tells you to stop. A special MedGuide will be given to you by the pharmacist with each prescription and refill. Be sure to read this information carefully each time. Talk to your care team about the use of this medication in children. While this medication may be prescribed for children as young as 10 years for selected conditions, precautions do apply. People over  16 years of age may have a stronger reaction to this medication and need smaller doses. Overdosage: If you think you have taken too much of this medicine contact a poison control center or emergency room at once. NOTE: This medicine is only for you. Do not share this medicine with others. What if I miss a dose? If you miss a dose, take it as soon as you can. If it is almost time for your next dose, take only that dose. Do not take double or extra doses. What may interact with this medication? Do not take this medication with any of the following: Cisapride Dronedarone Metoclopramide Pimozide Thioridazine This medication may also interact with the following: Alcohol Antihistamines for allergy, cough, and cold Atropine Avasimibe Certain antivirals for HIV or hepatitis Certain medications for anxiety or sleep Certain medications for bladder problems, such as oxybutynin, tolterodine Certain medications for depression, such as amitriptyline, fluoxetine, nefazodone, sertraline Certain medications for fungal infections, such as fluconazole, ketoconazole, itraconazole, posaconazole Certain medications for stomach problems, such as dicyclomine, hyoscyamine Certain medications for travel sickness, such as scopolamine Cimetidine General anesthetics, such as halothane, isoflurane, methoxyflurane, propofol Ipratropium Levodopa or other medications for Parkinson disease Medications for blood pressure Medications for seizures Medications that relax muscles for surgery Opioid medications for pain Other medications that cause heart rhythm changes Phenothiazines, such as chlorpromazine, prochlorperazine Rifampin St. John's wort This list may not describe all possible interactions. Give your health care provider a list of all the medicines, herbs, non-prescription drugs, or dietary supplements you use. Also tell them if you smoke, drink alcohol, or use illegal drugs. Some items may interact with  your medicine. What should I watch for while  using this medication? Visit your care team for regular checks on your progress. Tell your care team if your symptoms do not start to get better or if they get worse. Do not suddenly stop taking This medication. You may develop a severe reaction. Your care team will tell you how much medication to take. If your care team wants you to stop the medication, the dose may be slowly lowered over time to avoid any side effects. You may need to have an eye exam before and during use of this medication. This medication may increase blood sugar. Ask your care team if changes in diet or medications are needed if you have diabetes. This medication may cause thoughts of suicide or depression. This includes sudden changes in mood, behaviors, or thoughts. These changes can happen at any time but are more common in the beginning of treatment or after a change in dose. Call your care team right away if you experience these thoughts or worsening depression. This medication may affect your coordination, reaction time, or judgment. Do not drive or operate machinery until you know how this medication affects you. Sit up or stand slowly to reduce the risk of dizzy or fainting spells. Drinking alcohol with this medication can increase the risk of these side effects. This medication can cause problems with controlling your body temperature. It can lower the response of your body to cold temperatures. If possible, stay indoors during cold weather. If you must go outdoors, wear warm clothes. It can also lower the response of your body to heat. Do not overheat. Do not over-exercise. Stay out of the sun when possible. If you must be in the sun, wear cool clothing. Drink plenty of water. If you have trouble controlling your body temperature, call your care team right away. What side effects may I notice from receiving this medication? Side effects that you should report to your care team as  soon as possible: Allergic reactions--skin rash, itching, hives, swelling of the face, lips, tongue, or throat Heart rhythm changes--fast or irregular heartbeat, dizziness, feeling faint or lightheaded, chest pain, trouble breathing High blood sugar (hyperglycemia)--increased thirst or amount of urine, unusual weakness or fatigue, blurry vision High fever, stiff muscles, increased sweating, fast or irregular heartbeat, and confusion, which may be signs of neuroleptic malignant syndrome High prolactin level--unexpected breast tissue growth, discharge from the nipple, change in sex drive or performance, irregular menstrual cycle Increase in blood pressure in children Infection--fever, chills, cough, or sore throat Low blood pressure--dizziness, feeling faint or lightheaded, blurry vision Low thyroid levels (hypothyroidism)--unusual weakness or fatigue, increased sensitivity to cold, constipation, hair loss, dry skin, weight gain, feelings of depression Pain or trouble swallowing Seizures Stroke--sudden numbness or weakness of the face, arm, or leg, trouble speaking, confusion, trouble walking, loss of balance or coordination, dizziness, severe headache, change in vision Sudden eye pain or change in vision such as blurry vision, seeing halos around lights, vision loss Thoughts of suicide or self-harm, worsening mood, feelings of depression Trouble passing urine Uncontrolled and repetitive body movements, muscle stiffness or spasms, tremors or shaking, loss of balance or coordination, restlessness, shuffling walk, which may be signs of extrapyramidal symptoms (EPS) Side effects that usually do not require medical attention (report to your care team if they continue or are bothersome): Constipation Dizziness Drowsiness Dry mouth Weight gain This list may not describe all possible side effects. Call your doctor for medical advice about side effects. You may report side effects to FDA at  1-800-FDA-1088. Where should I keep my medication? Keep out of the reach of children. Store at room temperature between 15 and 30 degrees C (59 and 86 degrees F). Throw away any unused medication after the expiration date. NOTE: This sheet is a summary. It may not cover all possible information. If you have questions about this medicine, talk to your doctor, pharmacist, or health care provider.  2023 Elsevier/Gold Standard (2021-02-07 00:00:00)

## 2022-01-26 ENCOUNTER — Telehealth: Payer: Self-pay | Admitting: Psychiatry

## 2022-01-26 DIAGNOSIS — F29 Unspecified psychosis not due to a substance or known physiological condition: Secondary | ICD-10-CM

## 2022-01-26 DIAGNOSIS — F3342 Major depressive disorder, recurrent, in full remission: Secondary | ICD-10-CM

## 2022-01-26 MED ORDER — QUETIAPINE FUMARATE 25 MG PO TABS: 12.5000 mg | ORAL_TABLET | ORAL | 0 refills | Status: DC

## 2022-01-26 NOTE — Telephone Encounter (Signed)
Reviewed EKG-spoke to wife.  Wife to discuss EKG with primary care provider as well. Normal sinus rhythm-right bundle branch block. QTc-21.  Will go ahead and add Seroquel 12.5-25 mg at bedtime.  Provided education about side effects.  Patient or wife to call back as needed if this does not help or develops any side effects in a week or so.

## 2022-01-30 ENCOUNTER — Ambulatory Visit
Admission: RE | Admit: 2022-01-30 | Discharge: 2022-01-30 | Disposition: A | Discharge status: Home / Self Care | Payer: Medicare Other | Source: Ambulatory Visit | Attending: Physician Assistant | Admitting: Physician Assistant

## 2022-01-30 DIAGNOSIS — R479 Unspecified speech disturbances: Secondary | ICD-10-CM | POA: Insufficient documentation

## 2022-01-30 DIAGNOSIS — G479 Sleep disorder, unspecified: Secondary | ICD-10-CM | POA: Insufficient documentation

## 2022-01-30 DIAGNOSIS — R270 Ataxia, unspecified: Secondary | ICD-10-CM | POA: Insufficient documentation

## 2022-01-30 DIAGNOSIS — R443 Hallucinations, unspecified: Secondary | ICD-10-CM | POA: Diagnosis present

## 2022-01-30 DIAGNOSIS — R410 Disorientation, unspecified: Secondary | ICD-10-CM | POA: Insufficient documentation

## 2022-01-30 DIAGNOSIS — R296 Repeated falls: Secondary | ICD-10-CM | POA: Insufficient documentation

## 2022-02-05 ENCOUNTER — Ambulatory Visit (INDEPENDENT_AMBULATORY_CARE_PROVIDER_SITE_OTHER): Payer: Medicare Other | Admitting: Psychiatry

## 2022-02-05 ENCOUNTER — Encounter: Payer: Self-pay | Admitting: Psychiatry

## 2022-02-05 VITALS — BP 121/64 | HR 87 | Temp 97.8°F | Ht 76.0 in

## 2022-02-05 DIAGNOSIS — G3184 Mild cognitive impairment, so stated: Secondary | ICD-10-CM

## 2022-02-05 DIAGNOSIS — F3342 Major depressive disorder, recurrent, in full remission: Secondary | ICD-10-CM

## 2022-02-05 DIAGNOSIS — F29 Unspecified psychosis not due to a substance or known physiological condition: Secondary | ICD-10-CM

## 2022-02-05 DIAGNOSIS — G4701 Insomnia due to medical condition: Secondary | ICD-10-CM | POA: Diagnosis not present

## 2022-02-05 MED ORDER — QUETIAPINE FUMARATE 50 MG PO TABS: 50.0000 mg | ORAL_TABLET | Freq: Every day | ORAL | 1 refills | Status: DC

## 2022-02-05 NOTE — Patient Instructions (Signed)
Delirium Delirium is a state of mental confusion. It comes on quickly and causes significant changes in a person's thinking and behavior. People with delirium usually have trouble paying attention to what is going on or knowing where they are. They may become very withdrawn or very emotional and unable to sit still. They may even see or feel things that are not there (hallucinations). Delirium is a sign of a serious underlying medical condition. What are the causes? Delirium occurs when something suddenly affects the signals that the brain sends out. Brain signals can be affected by anything that puts severe stress on the body and brain and causes brain chemicals to be out of balance. The most common causes of delirium include: Infections. These may be bacterial, viral, fungal, or protozoal. Medicines. These include many over-the-counter and prescription medicines. Recreational drugs. Substance withdrawal. This occurs with sudden discontinuation of alcohol, certain medicines, or recreational drugs. Surgery and anesthesia. Sudden vascular events, such as stroke and brain hemorrhage. Other brain disorders, such as migraines, tumors, seizures, and physical head trauma. Metabolic disorders, such as kidney or liver failure. Low blood oxygen (anoxia). This may occur with lung disease, cardiac arrest, or carbon monoxide poisoning. Hormone imbalances (endocrinopathies), such as an overactive thyroid (hyperthyroidism) or underactive thyroid (hypothyroidism). Vitamin deficiencies. What increases the risk? The following factors may make someone more likely to develop this condition: Being a child. Being an older person. Living alone. Having vision loss or hearing loss. Having an existing brain disease, such as dementia. Having long-lasting (chronic) medical conditions, such as heart disease. Being hospitalized for long periods of time. What are the signs or symptoms? Delirium starts with a sudden  change in a person's thinking or behavior. Symptoms include: Not being able to stay awake (drowsiness) or pay attention. Being confused about places, time, and people. Forgetfulness. Having extreme energy levels. These may be low or high. Changes in sleep patterns. Extreme mood swings, such as sudden anger or anxiety. Focusing on things or ideas that are not important. Rambling and senseless talking. Difficulty speaking, understanding speech, or both. Hallucinations. Tremor or unsteady gait. Symptoms come and go throughout the day and are often worse at the end of the day. How is this diagnosed? People with delirium may not realize that they have the condition. Often, a family member or health care provider is the first person to notice the changes. This condition may be diagnosed based on a physical exam, health history, and tests. The health care provider will obtain a detailed history. This may include questions about: Current symptoms. Medical conditions that you have. Medicines. Drug use. The health care provider will perform a mental status test by: Asking questions to check for confusion. Watching for abnormal behavior. The health care provider may also order lab tests or additional studies to determine the cause of the delirium. How is this treated? Treatment of delirium depends on the cause and severity. Delirium usually goes away within days or weeks of treating the underlying cause. In the meantime, do not leave the person alone because he or she may accidentally cause self-harm. This condition may be treated with supportive care, such as: Increased light during the day and decreased light at night. Low noise level. Uninterrupted sleep. A regular daily schedule. Clocks and calendars to help with orientation. Familiar objects, including the person's pictures and clothing. Frequent visits from familiar family and friends. A healthy diet. Gentle exercise. In more severe  cases of delirium, medicine may be prescribed to help the  person keep calm and think more clearly. Follow these instructions at home:  Continue supportive care as told by a health care provider. Take over-the-counter and prescription medicines only as told by your health care provider. Ask a health care provider before using herbs or supplements. Do not use alcohol or illegal drugs. Keep all follow-up visits. This is important. Contact a health care provider if: Symptoms do not get better or they become worse. New symptoms of delirium develop. Caring for the person at home does not seem safe. Eating, drinking, or communicating stops. There are side effects of medicines, such as changes in sleep patterns, dizziness, weight gain, restlessness, movement changes, or tremors. Get help right away if: The person has thoughts of harming self or harming others. There are serious side effects of medicine, such as: Swelling of the face, lips, tongue, or throat. Fever, confusion, muscle spasms, or seizures. If you ever feel like a loved one may hurt himself or herself or others, or shares thoughts about taking his or her own life, get help right away. You can go to your nearest emergency department or: Call your local emergency services (911 in the [place] Call a suicide crisis helpline, such as the [place] at 5200045853 or 988 in the [place]. This is open 24 hours a day in the [place] Text the Crisis Text Line at 747 080 8312 (in the [place].). Summary Delirium is a state of mental confusion. It comes on quickly and causes significant changes in a person's thinking and behavior. Delirium is a sign of a serious underlying medical condition. Certain medical conditions or a long hospital stay may increase the risk of developing delirium. Treatment of delirium involves treating the underlying cause and providing supportive treatments, such as a calm and familiar environment. This  information is not intended to replace advice given to you by your health care provider. Make sure you discuss any questions you have with your health care provider. Document Revised: 10/02/2020 Document Reviewed: 06/16/2019 Elsevier Patient Education  [employer].

## 2022-02-05 NOTE — Progress Notes (Signed)
[facility] MD OP Progress Note  02/05/2022 5:11 PM Mark [name].  MRN:  [id number]  Chief Complaint:  Chief Complaint  Patient presents with   Follow-up   Medication Refill   Hallucinations   HPI: Mark Garrisonis an 81 year old Caucasian male, lives in [place], has a history of MDD, insomnia, pneumothorax, history of syncopal episodes,BPH , multiple other medical problems including restless leg syndrome, obstructive sleep apnea noncompliant with CPAP, memory problems, presented for medication management.  Patient presented with his wife- Mark [name], and Mark [name].  As per wife patient has not had any improvement with his hallucinations and acting out in his sleep since last visit.  Patient continues to be pulling things out of the air especially in his sleep and seems to be confused in the afternoon daily.  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  Patient although sleeps okay at night, appears to have abnormal dreams, acts out and talks loud in his sleep.  Patient also seems to have urinary urgency and increased frequency of urine.  Recently treated for acute cystitis, completed Bactrim.  His primary care provider did not do a workup on him yesterday, waiting to talk to him.  Patient today appeared to be alert, oriented to person place time situation.  Patient was able to draw a clock with all the numbers however although he knew to put the correct time ,patient had some difficulty drawing the hour markers, due to problems holding the pen with his hands.  Per wife patient has been having trouble holding the pen as well as has been having trouble walking since the past few weeks ever since he was diagnosed with the acute cystitis and he started falling.  In the past 2 weeks or so he had several different falls.  EMS had to be called to help lift him off the floor.  Patient does have a neurology  appointment this afternoon.  He did have an MRI of his brain recently, awaiting to discuss with neurology.  Patient with recent readjustment of his dosage of Aricept to 10 mg since September, spouse wonders whether that has anything to do with his worsening behavioral problems as well as hallucinations since his symptoms started getting worse around the same time it was readjusted.  Agreeable to the discuss with neurologist this afternoon.  Patient denies any suicidality or homicidality.  Denies any side effects to the Seroquel.  Denies any other concerns today.    Visit Diagnosis:    ICD-10-CM   1. MDD (major depressive disorder), recurrent, in full remission ([facility])  F33.42 QUEtiapine (SEROQUEL) 50 MG tablet    2. Psychosis, unspecified psychosis type ([facility])  F29 QUEtiapine (SEROQUEL) 50 MG tablet   R/O Delirium    3. Insomnia due to medical condition  G47.01    multiple including mood, possible delirium    4. MCI (mild cognitive impairment)  G31.84       Past Psychiatric History: Reviewed past psychiatric history from progress note on 04/20/2018.  Past trials of Prozac, Wellbutrin.  Past Medical History:  Past Medical History:  Diagnosis Date   Anxiety    Atrial fibrillation (HCC)    Back pain, chronic    Cancer (HCC)    skin   Colon polyp    Cyst of left kidney    GERD (gastroesophageal reflux disease)  Heart murmur    Hyperlipemia    Mitral valve prolapse    Neuromuscular disorder (HCC)    Neuropathy    Osteoarthritis    Pulmonary emboli (HCC)    Spinal stenosis    Thyroid nodule    Vertigo     Past Surgical History:  Procedure Laterality Date   APPENDECTOMY     BACK SURGERY     COLONOSCOPY N/A 08/15/2014   Procedure: COLONOSCOPY;  Surgeon: Mark Silvas, MD;  Location: [facility];  Service: Endoscopy;  Laterality: N/A;   ESOPHAGOGASTRODUODENOSCOPY N/A 08/15/2014   Procedure: ESOPHAGOGASTRODUODENOSCOPY (EGD);  Surgeon: Mark Silvas, MD;  Location:  [facility] ENDOSCOPY;  Service: Endoscopy;  Laterality: N/A;   TONSILLECTOMY      Family Psychiatric History: Reviewed family psychiatric history from progress note on 04/20/2018.  Family History:  Family History  Problem Relation Age of Onset   Stroke Mother    COPD Father     Social History: Reviewed social history from progress note on 04/20/2018. Social History   Socioeconomic History   Marital status: Married    Spouse name: Mark [name]   Number of children: 2   Years of education: Not on file   Highest education level: Master's degree (e.g., MA, MS, MEng, MEd, MSW, MBA)  Occupational History   Not on file  Tobacco Use   Smoking status: Former    Types: Cigarettes    Quit date: 04/20/1958    Years since quitting: 63.8   Smokeless tobacco: Never  Vaping Use   Vaping Use: Never used  Substance and Sexual Activity   Alcohol use: Yes    Alcohol/week: 1.0 standard drink of alcohol    Types: 1 Glasses of wine per week   Drug use: Never   Sexual activity: Not Currently  Other Topics Concern   Not on file  Social History Narrative   Not on file   Social Determinants of Health   Financial Resource Strain: Low Risk  (04/20/2018)   Overall Financial Resource Strain (CARDIA)    Difficulty of Paying Living Expenses: Not hard at all  Food Insecurity: No Food Insecurity (04/20/2018)   Hunger Vital Sign    Worried About Running Out of Food in the Last Year: Never true    Ran Out of Food in the Last Year: Never true  Transportation Needs: No Transportation Needs (04/20/2018)   PRAPARE - Hydrologist (Medical): No    Lack of Transportation (Non-Medical): No  Physical Activity: Insufficiently Active (04/20/2018)   Exercise Vital Sign    Days of Exercise per Week: 2 days    Minutes of Exercise per Session: 60 min  Stress: No Stress Concern Present (04/20/2018)   [place]    Feeling of Stress :  Not at all  Social Connections: Unknown (04/20/2018)   Social Connection and Isolation Panel [NHANES]    Frequency of Communication with Friends and Family: Not on file    Frequency of Social Gatherings with Friends and Family: Not on file    Attends Religious Services: More than 4 times per year    Active Member of [employer] or Organizations: Yes    Attends Archivist Meetings: More than 4 times per year    Marital Status: Married    Allergies:  Allergies  Allergen Reactions   Ciprofloxacin Other (See Comments)   Erythromycin Swelling   Keflex [Cephalexin] Swelling   Penicillins Swelling  Has patient had a PCN reaction causing immediate rash, facial/tongue/throat swelling, SOB or lightheadedness with hypotension: Yes Has patient had a PCN reaction causing severe rash involving mucus membranes or skin necrosis: No Has patient had a PCN reaction that required hospitalization: Unknown Has patient had a PCN reaction occurring within the last 10 years: No If all of the above answers are "NO", then may proceed with Cephalosporin use.     Metabolic Disorder Labs: No results found for: "HGBA1C", "MPG" No results found for: "PROLACTIN" No results found for: "CHOL", "TRIG", "HDL", "CHOLHDL", "VLDL", "LDLCALC" Lab Results  Component Value Date   TSH 0.866 02/21/2018    Therapeutic Level Labs: No results found for: "LITHIUM" No results found for: "VALPROATE" No results found for: "CBMZ"  Current Medications: Current Outpatient Medications  Medication Sig Dispense Refill   apixaban (ELIQUIS) 2.5 MG TABS tablet Take 2.5 mg by mouth 2 (two) times daily.     B Complex Vitamins (VITAMIN B COMPLEX) TABS Take by mouth.     betamethasone dipropionate 0.05 % cream Apply topically 2 (two) times daily. 30 g 0   buPROPion (WELLBUTRIN XL) 150 MG 24 hr tablet Take 150 mg by mouth daily.     Clotrimazole 1 % OINT Apply twice daily x7 days. 56.7 g 0   Coenzyme Q10 (CO Q 10 PO) Take 1 Dose  by mouth daily.      Cranberry 200 MG CAPS Take 200 mg by mouth daily.     Cyanocobalamin (B-12) 5000 MCG CAPS Take 5,000 mg by mouth daily. (Patient taking differently: Take 5,000 mcg by mouth daily.) 30 capsule 0   donepezil (ARICEPT) 10 MG tablet Take 10 mg by mouth at bedtime.     esomeprazole (NEXIUM) 40 MG capsule Take 40 mg by mouth daily.     finasteride (PROSCAR) 5 MG tablet Take 1 tablet (5 mg total) by mouth daily. 90 tablet 3   FLUoxetine (PROZAC) 20 MG capsule TAKE 1 CAPSULE BY MOUTH ONCE DAILY. TO BE COMBINED WITH '40MG'$ . 90 capsule 1   FLUoxetine (PROZAC) 40 MG capsule TAKE 1 CAPSULE (40 MG) BY MOUTH EVERY DAY - COMBINE WITH 20 MG = 60 MG TOTAL 90 capsule 1   meclizine (ANTIVERT) 25 MG tablet Take 1 tablet (25 mg total) by mouth 3 (three) times daily as needed for dizziness. 30 tablet 0   ondansetron (ZOFRAN-ODT) 4 MG disintegrating tablet Take 4 mg by mouth every 8 (eight) hours as needed.     QUEtiapine (SEROQUEL) 50 MG tablet Take 1 tablet (50 mg total) by mouth at bedtime. 30 tablet 1   simvastatin (ZOCOR) 40 MG tablet Take 40 mg by mouth at bedtime.      sucralfate (CARAFATE) 1 g tablet Take by mouth.     tamsulosin (FLOMAX) 0.4 MG CAPS capsule Take 1 capsule (0.4 mg total) by mouth daily. 90 capsule 3   triamcinolone (KENALOG) 0.1 % paste 1 Application 2 (two) times daily.     sulfamethoxazole-trimethoprim (BACTRIM DS) 800-160 MG tablet Take 1 tablet by mouth 2 (two) times daily. (Patient not taking: Reported on 02/05/2022)     traMADol (ULTRAM) 50 MG tablet Take 1 tablet (50 mg total) by mouth every 6 (six) hours as needed. (Patient not taking: Reported on 02/05/2022) 20 tablet 0   Current Facility-Administered Medications  Medication Dose Route Frequency Provider Last Rate Last Admin   nystatin (MYCOSTATIN/NYSTOP) topical powder   Topical BID Hollice Espy, MD  Musculoskeletal: Strength & Muscle Tone:  UTA Gait & Station:  Wheelchair bound Patient leans:  N/A  Psychiatric Specialty Exam: Review of Systems  Unable to perform ROS: Mental status change    Blood pressure 121/64, pulse 87, temperature 97.8 F (36.6 C), temperature source Oral, height '6\' 4"'$  (1.93 m).Body mass index is 29.21 kg/m.  General Appearance: Casual  Eye Contact:  Fair  Speech:  Normal Rate  Volume:  Decreased  Mood:  Euthymic  Affect:  Congruent  Thought Process:  Linear and Descriptions of Associations: Intact  Orientation:  Full (Time, Place, and Person)  Thought Content:  per report from family [facility] as well as delusions - since past 2-3 weeks , nothing observed in session, patient unable to elaborate    Suicidal Thoughts:  No  Homicidal Thoughts:  No  Memory:  Immediate;   Fair Recent;   limited Remote;   limited  Judgement:  Fair  Insight:  Fair  Psychomotor Activity:  Normal  Concentration:  Concentration: Poor and Attention Span: Poor  Recall:  Poor  Fund of Knowledge: Fair  Language: Fair  Akathisia:  No  Handed:  Right  AIMS (if indicated): done  Assets:  Communication Skills Desire for Improvement Housing Social Support  ADL's:  Intact  Cognition: Impaired,  Mild  Sleep:  Poor acting out in sleep   Screenings: [facility] Office Visit from 02/05/2022 in [facility] Total Score 0      PHQ2-9    [facility] Visit from 07/21/2021 in [facility] Visit from 12/10/2020 in [facility]  PHQ-2 Total Score 0 0  PHQ-9 Total Score 0 --      [place] [facility] Office Visit from 01/22/2022 in [facility] from 01/16/2022 in [facility] Office Visit from 12/10/2020 in [facility] No Risk No Risk No Risk        Assessment and Plan: Mark [name]. Is a 81 year old Caucasian male, lives in [place], has a history  of depression, BPH, history of falls, syncopal episodes, basal cell carcinoma, hyperlipidemia was evaluated in office today.  Patient with recent falls, hallucinations, recently completed a course of Bactrim for acute cystitis however continues to have confusion, hallucinations, sundowning per report by family, will benefit from the following plan.  Plan MDD in full remission Wellbutrin XL 150 mg p.o. daily.  Insomnia-unstable Patient with acting out episodes and hallucinations in his sleep.  Unknown if acting out episodes and hallucinations likely also due to being on Aricept.  Patient to discuss with neurology.  Has upcoming appointment this afternoon. Increase Seroquel to 50 mg p.o. nightly Noncompliant with CPAP  Psychosis on specified-rule out delirium due to medical illness-acute cystitis versus side effect to Aricept.-Unstable Patient to follow-up with neurology this afternoon to discuss tapering off the Aricept likely contributing to  side effects as noted above. Increase Seroquel to 50 mg p.o. nightly  Collateral info history obtained from wife as well as son as noted above.  Patient to continue to follow-up with neurology as well as primary care provider.  Patient also may benefit from going to the nearest emergency department for possible admission if he does not improve on his AMS worsens.   Follow-up in clinic in 4 weeks or sooner if needed. This note was generated in part or whole with voice recognition software. Voice recognition is  usually quite accurate but there are transcription errors that can and very often do occur. I apologize for any typographical errors that were not detected and corrected.      Ursula Alert, MD 02/05/2022, 5:11 PM

## 2022-02-06 ENCOUNTER — Telehealth: Payer: Self-pay

## 2022-02-06 DIAGNOSIS — F29 Unspecified psychosis not due to a substance or known physiological condition: Secondary | ICD-10-CM

## 2022-02-06 MED ORDER — QUETIAPINE FUMARATE 25 MG PO TABS: 37.5000 mg | ORAL_TABLET | Freq: Every day | ORAL | 1 refills | Status: DC

## 2022-02-06 NOTE — Telephone Encounter (Signed)
spoke with patient wife she states that they received the new dosage today but that she had '25mg'$  of it and so last night she gave him 2 of the '25mg'$  to equal '50mg'$  and he is very out of it. is there something else that will not be so out of it. spoke with patient wife she states that they received the new dosage today but that she had '25mg'$  of it and so last night she gave him 2 of the '25mg'$  to equal '50mg'$  and he is very out of it. is there something else that will not be so out of it.

## 2022-02-06 NOTE — Telephone Encounter (Signed)
I sent a new script with dose change to pharmacy yesterday , please advise to call pharmacy.

## 2022-02-06 NOTE — Telephone Encounter (Signed)
pt wife called left message that patient night medications were double and he is out of it so she wanted to know if he can decrease to 1.5 instead of 2?   Pt was last seen on 11-16 next appt  12-14

## 2022-02-06 NOTE — Telephone Encounter (Signed)
I have sent Seroquel 37.5 mg to pharmacy after speaking to wife-[name]-patient is drowsy on the 50 mg of Seroquel.

## 2022-03-05 ENCOUNTER — Telehealth (INDEPENDENT_AMBULATORY_CARE_PROVIDER_SITE_OTHER): Payer: Medicare Other | Admitting: Psychiatry

## 2022-03-05 ENCOUNTER — Encounter: Payer: Self-pay | Admitting: Psychiatry

## 2022-03-05 DIAGNOSIS — F23 Brief psychotic disorder: Secondary | ICD-10-CM

## 2022-03-05 DIAGNOSIS — F3342 Major depressive disorder, recurrent, in full remission: Secondary | ICD-10-CM

## 2022-03-05 DIAGNOSIS — G4701 Insomnia due to medical condition: Secondary | ICD-10-CM

## 2022-03-05 DIAGNOSIS — G3184 Mild cognitive impairment, so stated: Secondary | ICD-10-CM

## 2022-03-05 MED ORDER — QUETIAPINE FUMARATE 25 MG PO TABS: 12.5000 mg | ORAL_TABLET | Freq: Every evening | ORAL | 0 refills | Status: DC | PRN

## 2022-03-05 NOTE — Progress Notes (Signed)
Virtual Visit via Video Note  I connected with Mark [name]. on 03/05/22 at 10:30 AM EST by a video enabled telemedicine application and verified that I am speaking with the correct person using two identifiers.  Location Provider Location : [facility] Patient Location : Home  Participants: Patient , Spouse,Provider   I discussed the limitations of evaluation and management by telemedicine and the availability of in person appointments. The patient expressed understanding and agreed to proceed.   I discussed the assessment and treatment plan with the patient. The patient was provided an opportunity to ask questions and all were answered. The patient agreed with the plan and demonstrated an understanding of the instructions.   The patient was advised to call back or seek an in-person evaluation if the symptoms worsen or if the condition fails to improve as anticipated.   Oriole Beach MD OP Progress Note  03/05/2022 11:26 AM Mark Dresser Durel Salts.  MRN:  [id number]  Chief Complaint:  Chief Complaint  Patient presents with   Medication Refill   Anxiety   Depression   Hallucinations   HPI: Mark Garrisonis an 81 year old Caucasian male, lives in [place], has a history of MDD, insomnia, history of pneumothorax, history of syncopal episodes, BPH, RLS, obstructive sleep apnea noncompliant with CPAP, recent altered mental status, hallucinations, UTI was evaluated by telemedicine today.  Patient today appeared to be alert, oriented to person place time situation.  Patient's 3 word memory immediately 3 out of 3 after 5 minutes 1 out of 3.  Patient was able to do serial sevens well.  Concentration and attention seem to be good.  Patient currently denies any significant sadness, hopelessness, anxiety symptoms.  Denies any hallucinations, paranoia, did not seem to be preoccupied with any delusions.  Per spouse who provided collateral information patient completed the course of antibiotic for UTI, currently  does not have any UTI symptoms.  Patient stopped the Aricept and currently does not have any hallucinations or confusions.  It is likely the Aricept in combination with UTI could have triggered his hallucinations.  Patient has started physical therapy and has started walking with a walker again.  Celebrated his birthday recently.  His children came over and he had a good time.  Currently compliant on the Prozac and the Wellbutrin.  Would like to stay on the Wellbutrin at this time.  Agreeable to tapering off of Seroquel.  Denies other side effects.  Patient as well as wife denies any other concerns today.  Visit Diagnosis:    ICD-10-CM   1. MDD (major depressive disorder), recurrent, in full remission ([facility])  F33.42     2. Brief psychotic disorder (HCC)  F23 QUEtiapine (SEROQUEL) 25 MG tablet   Likely medication induced-Aricept.  Also had a UTI.    3. Insomnia due to medical condition  G47.01    Recent UTI    4. MCI (mild cognitive impairment)  G31.84       Past Psychiatric History: Reviewed past psychiatric history from progress note on 04/20/2018.  Past trials of Prozac, Wellbutrin.  Past Medical History:  Past Medical History:  Diagnosis Date   Anxiety    Atrial fibrillation (HCC)    Back pain, chronic    Cancer (HCC)    skin   Colon polyp    Cyst of left kidney    GERD (gastroesophageal reflux disease)    Heart murmur    Hyperlipemia    Mitral valve prolapse    Neuromuscular disorder ([place])  Neuropathy    Osteoarthritis    Pulmonary emboli (HCC)    Spinal stenosis    Thyroid nodule    Vertigo     Past Surgical History:  Procedure Laterality Date   APPENDECTOMY     BACK SURGERY     COLONOSCOPY N/A 08/15/2014   Procedure: COLONOSCOPY;  Surgeon: [name], MD;  Location: [facility];  Service: Endoscopy;  Laterality: N/A;   ESOPHAGOGASTRODUODENOSCOPY N/A 08/15/2014   Procedure: ESOPHAGOGASTRODUODENOSCOPY (EGD);  Surgeon: [name], MD;  Location[facility]  ENDOSCOPY;  Service: Endoscopy;  Laterality: N/A;   TONSILLECTOMY      Family Psychiatric History: Reviewed family psychiatric history from progress note on 04/20/2018.  Family History:  Family History  Problem Relation Age of Onset   Stroke Mother    COPD Father     Social History: Reviewed social history from progress note on 04/20/2018. Social History   Socioeconomic History   Marital status: Married    Spouse name: gail   Number of children: 2   Years of education: Not on file   Highest education level: Master's degree (e.g., MA, MS, MEng, MEd, MSW, MBA)  Occupational History   Not on file  Tobacco Use   Smoking status: Former    Types: Cigarettes    Quit date: 04/20/1958    Years since quitting: 63.9   Smokeless tobacco: Never  Vaping Use   Vaping Use: Never used  Substance and Sexual Activity   Alcohol use: Yes    Alcohol/week: 1.0 standard drink of alcohol    Types: 1 Glasses of wine per week   Drug use: Never   Sexual activity: Not Currently  Other Topics Concern   Not on file  Social History Narrative   Not on file   Social Determinants of Health   Financial Resource Strain: Low Risk  (04/20/2018)   Overall Financial Resource Strain (CARDIA)    Difficulty of Paying Living Expenses: Not hard at all  Food Insecurity: No Food Insecurity (04/20/2018)   Hunger Vital Sign    Worried About Running Out of Food in the Last Year: Never true    Ran Out of Food in the Last Year: Never true  Transportation Needs: No Transportation Needs (04/20/2018)   PRAPARE - Hydrologist (Medical): No    Lack of Transportation (Non-Medical): No  Physical Activity: Insufficiently Active (04/20/2018)   Exercise Vital Sign    Days of Exercise per Week: 2 days    Minutes of Exercise per Session: 60 min  Stress: No Stress Concern Present (04/20/2018)   [place]    Feeling of Stress : Not at  all  Social Connections: Unknown (04/20/2018)   Social Connection and Isolation Panel [NHANES]    Frequency of Communication with Friends and Family: Not on file    Frequency of Social Gatherings with Friends and Family: Not on file    Attends Religious Services: More than 4 times per year    Active Member of [employer] or Organizations: Yes    Attends Archivist Meetings: More than 4 times per year    Marital Status: Married    Allergies:  Allergies  Allergen Reactions   Aricept [Donepezil]     Hallucination, confusion   Ciprofloxacin Other (See Comments)   Erythromycin Swelling   Keflex [Cephalexin] Swelling   Penicillins Swelling    Has patient had a PCN reaction causing immediate rash, facial/tongue/throat  swelling, SOB or lightheadedness with hypotension: Yes Has patient had a PCN reaction causing severe rash involving mucus membranes or skin necrosis: No Has patient had a PCN reaction that required hospitalization: Unknown Has patient had a PCN reaction occurring within the last 10 years: No If all of the above answers are "NO", then may proceed with Cephalosporin use.     Metabolic Disorder Labs: No results found for: "HGBA1C", "MPG" No results found for: "PROLACTIN" No results found for: "CHOL", "TRIG", "HDL", "CHOLHDL", "VLDL", "LDLCALC" Lab Results  Component Value Date   TSH 0.866 02/21/2018    Therapeutic Level Labs: No results found for: "LITHIUM" No results found for: "VALPROATE" No results found for: "CBMZ"  Current Medications: Current Outpatient Medications  Medication Sig Dispense Refill   apixaban (ELIQUIS) 2.5 MG TABS tablet Take 2.5 mg by mouth 2 (two) times daily.     B Complex Vitamins (VITAMIN B COMPLEX) TABS Take by mouth.     betamethasone dipropionate 0.05 % cream Apply topically 2 (two) times daily. 30 g 0   buPROPion (WELLBUTRIN XL) 150 MG 24 hr tablet Take 150 mg by mouth daily.     Clotrimazole 1 % OINT Apply twice daily x7 days.  56.7 g 0   Coenzyme Q10 (CO Q 10 PO) Take 1 Dose by mouth daily.      Cranberry 200 MG CAPS Take 200 mg by mouth daily.     Cyanocobalamin (B-12) 5000 MCG CAPS Take 5,000 mg by mouth daily. (Patient taking differently: Take 5,000 mcg by mouth daily.) 30 capsule 0   docusate sodium (COLACE) 100 MG capsule Take 100 mg by mouth 3 (three) times daily as needed for mild constipation.     esomeprazole (NEXIUM) 40 MG capsule Take 40 mg by mouth daily.     finasteride (PROSCAR) 5 MG tablet Take 1 tablet (5 mg total) by mouth daily. 90 tablet 3   FLUoxetine (PROZAC) 20 MG capsule TAKE 1 CAPSULE BY MOUTH ONCE DAILY. TO BE COMBINED WITH '40MG'$ . 90 capsule 1   FLUoxetine (PROZAC) 40 MG capsule TAKE 1 CAPSULE (40 MG) BY MOUTH EVERY DAY - COMBINE WITH 20 MG = 60 MG TOTAL 90 capsule 1   meclizine (ANTIVERT) 25 MG tablet Take 1 tablet (25 mg total) by mouth 3 (three) times daily as needed for dizziness. 30 tablet 0   niacinamide 100 MG tablet Take 100 mg by mouth in the morning.     ondansetron (ZOFRAN-ODT) 4 MG disintegrating tablet Take 4 mg by mouth every 8 (eight) hours as needed.     simvastatin (ZOCOR) 40 MG tablet Take 40 mg by mouth at bedtime.      sucralfate (CARAFATE) 1 g tablet Take by mouth.     tamsulosin (FLOMAX) 0.4 MG CAPS capsule Take 1 capsule (0.4 mg total) by mouth daily. 90 capsule 3   QUEtiapine (SEROQUEL) 25 MG tablet Take 0.5 tablets (12.5 mg total) by mouth at bedtime as needed. 15 tablet 0   triamcinolone (KENALOG) 0.1 % paste 1 Application 2 (two) times daily. (Patient not taking: Reported on 03/05/2022)     Current Facility-Administered Medications  Medication Dose Route Frequency Provider Last Rate Last Admin   nystatin (MYCOSTATIN/NYSTOP) topical powder   Topical BID Hollice Espy, MD         Musculoskeletal: Strength & Muscle Tone:  UTA Gait & Station:  Seated Patient leans: N/A  Psychiatric Specialty Exam: Review of Systems  Psychiatric/Behavioral: Negative.    All  other systems  reviewed and are negative.   There were no vitals taken for this visit.There is no height or weight on file to calculate BMI.  General Appearance: Casual  Eye Contact:  Fair  Speech:  Clear and Coherent  Volume:  Normal  Mood:  Euthymic  Affect:  Congruent  Thought Process:  Goal Directed and Descriptions of Associations: Intact  Orientation:  Full (Time, Place, and Person)  Thought Content: Logical   Suicidal Thoughts:  No  Homicidal Thoughts:  No  Memory:  Immediate;   Fair Recent;   Fair Remote;   limited  Judgement:  Fair  Insight:  Fair  Psychomotor Activity:  Normal  Concentration:  Concentration: Fair and Attention Span: Fair  Recall:   limited  Fund of Knowledge: Fair  Language: Fair  Akathisia:  No  Handed:  Right  AIMS (if indicated): not done  Assets:  Communication Skills Desire for [place] Talents/Skills Transportation  ADL's:  Intact  Cognition: WNL  Sleep:  Fair   Screenings: [place] Office Visit from 02/05/2022 in [facility] Total Score 0      PHQ2-9    [place] Video Visit from 03/05/2022 in [facility] Office Visit from 07/21/2021 in [facility] Visit from 12/10/2020 in [facility]  PHQ-2 Total Score 0 0 0  PHQ-9 Total Score -- 0 --      Flowsheet Row Video Visit from 03/05/2022 in [facility] Visit from 01/22/2022 in [facility] ED from 01/16/2022 in [facility] CATEGORY No Risk No Risk No Risk        Assessment and Plan: Mark [name]. is a 81 year old Caucasian male, lives in [place], has a history of depression, recent psychotic episode likely medication-induced as well as UTI, presented for medication management.  Patient is  currently improved, psychosis resolved.  Plan as noted below.  Plan MDD in full remission Wellbutrin XL 150 mg p.o. daily Continue Prozac 60 mg p.o. daily.  Brief psychotic disorder-resolved Will taper off Seroquel, advised to reduce Seroquel to 12.5 mg p.o. nightly for the next 10 days and to use it as needed thereafter. Patient currently not on Aricept.  Insomnia-improved Patient is currently sleeping better.  Mild cognitive impairment-patient to continue to follow-up with neurology for the same.  Collateral information obtained from spouse as noted above.  Follow-up in clinic in 4 to 5 months or sooner if needed. Collaboration of Care: Collaboration of Care: Other patient encouraged to continue to follow-up with neurology.  Patient/Guardian was advised Release of Information must be obtained prior to any record release in order to collaborate their care with an outside provider. Patient/Guardian was advised if they have not already done so to contact the registration department to sign all necessary forms in order for [place] to release information regarding their care.   Consent: Patient/Guardian gives verbal consent for treatment and assignment of benefits for services provided during this visit. Patient/Guardian expressed understanding and agreed to proceed.   This note was generated in part or whole with voice recognition software. Voice recognition is usually quite accurate but there are transcription errors that can and very often do occur. I apologize for any typographical errors that were not detected and corrected.      Ursula Alert, MD 03/05/2022, 11:26 AM

## 2022-07-30 ENCOUNTER — Encounter: Payer: Self-pay | Admitting: Urology

## 2022-07-30 ENCOUNTER — Ambulatory Visit (INDEPENDENT_AMBULATORY_CARE_PROVIDER_SITE_OTHER): Payer: Medicare Other | Admitting: Psychiatry

## 2022-07-30 ENCOUNTER — Encounter: Payer: Self-pay | Admitting: Psychiatry

## 2022-07-30 ENCOUNTER — Ambulatory Visit (INDEPENDENT_AMBULATORY_CARE_PROVIDER_SITE_OTHER): Payer: Medicare Other | Admitting: Urology

## 2022-07-30 VITALS — BP 129/74 | HR 80 | Temp 97.2°F | Ht 76.0 in

## 2022-07-30 VITALS — BP 118/64 | HR 89 | Temp 97.9°F | Ht 76.0 in | Wt 240.0 lb

## 2022-07-30 DIAGNOSIS — G4701 Insomnia due to medical condition: Secondary | ICD-10-CM

## 2022-07-30 DIAGNOSIS — R443 Hallucinations, unspecified: Secondary | ICD-10-CM

## 2022-07-30 DIAGNOSIS — F29 Unspecified psychosis not due to a substance or known physiological condition: Secondary | ICD-10-CM

## 2022-07-30 DIAGNOSIS — R350 Frequency of micturition: Secondary | ICD-10-CM

## 2022-07-30 DIAGNOSIS — F3342 Major depressive disorder, recurrent, in full remission: Secondary | ICD-10-CM

## 2022-07-30 DIAGNOSIS — N401 Enlarged prostate with lower urinary tract symptoms: Secondary | ICD-10-CM

## 2022-07-30 DIAGNOSIS — R3989 Other symptoms and signs involving the genitourinary system: Secondary | ICD-10-CM

## 2022-07-30 DIAGNOSIS — G3184 Mild cognitive impairment, so stated: Secondary | ICD-10-CM | POA: Diagnosis not present

## 2022-07-30 LAB — BLADDER SCAN AMB NON-IMAGING: Scan Result: 5

## 2022-07-30 MED ORDER — QUETIAPINE FUMARATE 25 MG PO TABS
12.5000 mg | ORAL_TABLET | Freq: Every evening | ORAL | 1 refills | Status: DC | PRN
Start: 2022-07-30 — End: 2022-08-07

## 2022-07-30 MED ORDER — FLUOXETINE HCL 40 MG PO CAPS: ORAL_CAPSULE | ORAL | 1 refills | Status: DC

## 2022-07-30 MED ORDER — SULFAMETHOXAZOLE-TRIMETHOPRIM 800-160 MG PO TABS
1.0000 | ORAL_TABLET | Freq: Two times a day (BID) | ORAL | 0 refills | Status: DC
Start: 2022-07-30 — End: 2022-08-06

## 2022-07-30 MED ORDER — FLUOXETINE HCL 20 MG PO CAPS: ORAL_CAPSULE | ORAL | 1 refills | Status: DC

## 2022-07-30 NOTE — Progress Notes (Signed)
BH MD OP Progress Note  07/30/2022 11:37 AM Mark [name].  MRN:  [id number]  Chief Complaint:  Chief Complaint  Patient presents with   Follow-up   Anxiety   Medication Refill   Altered Mental Status   HPI: Mark Garrisonis a 82 year old Caucasian male, lives in [place], has a history of MDD, insomnia, history of pneumothorax, history of syncopal episodes, BPH, RLS, obstructive sleep apnea noncompliant with CPAP, recent confusion, possible psychosis was evaluated in office today.  Patient today appeared to be pleasant, answered questions appropriately.  Patient was able to do an MMSE in session today scored 29 out of 30.  Patient also was able to draw a clock well.  Patient denies any significant anxiety or mood symptoms.  Reports sleep is overall okay although he does have some nights when he has sleep issues.  As per wife, patient since the past couple of days has been having episodic confusion, memory problems.  Last night he woke up in the middle of the night and was observed as talking to himself possibly responding to internal stimuli.  Wife wonders whether he has another UTI since the last time he had a UTI he had similar symptoms of confusion and possible psychosis.  She is going to follow up with this provider to get a urine analysis.  Patient currently denies any suicidality, homicidality or perceptual disturbances.  Denies any side effects to medications, reports he is compliant.  Visit Diagnosis:    ICD-10-CM   1. MDD (major depressive disorder), recurrent, in full remission (HCC)  F33.42 FLUoxetine (PROZAC) 20 MG capsule    FLUoxetine (PROZAC) 40 MG capsule    2. Insomnia due to medical condition  G47.01    Depression, anxiety, recurrent UTI    3. MCI (mild cognitive impairment)  G31.84     4. Psychosis, unspecified psychosis type (HCC)  F29 QUEtiapine (SEROQUEL) 25 MG tablet   Likely UTI      Past Psychiatric History: I have reviewed psychiatric history from  progress note on 04/20/2018.  Past trials of Prozac, Wellbutrin.  Past Medical History:  Past Medical History:  Diagnosis Date   Anxiety    Atrial fibrillation (HCC)    Back pain, chronic    Cancer (HCC)    skin   Colon polyp    Cyst of left kidney    GERD (gastroesophageal reflux disease)    Heart murmur    Hyperlipemia    Mitral valve prolapse    Neuromuscular disorder (HCC)    Neuropathy    Osteoarthritis    Pulmonary emboli (HCC)    Spinal stenosis    Thyroid nodule    Vertigo     Past Surgical History:  Procedure Laterality Date   APPENDECTOMY     BACK SURGERY     COLONOSCOPY N/A 08/15/2014   Procedure: COLONOSCOPY;  Surgeon: Scot Jun, MD;  Location: [facility] ENDOSCOPY;  Service: Endoscopy;  Laterality: N/A;   ESOPHAGOGASTRODUODENOSCOPY N/A 08/15/2014   Procedure: ESOPHAGOGASTRODUODENOSCOPY (EGD);  Surgeon: Scot Jun, MD;  Location: [facility] ENDOSCOPY;  Service: Endoscopy;  Laterality: N/A;   TONSILLECTOMY      Family Psychiatric History: I have reviewed family psychiatric history from progress note on 04/20/2018.  Family History:  Family History  Problem Relation Age of Onset   Stroke Mother    COPD Father     Social History: Reviewed social history from progress note on 04/20/2018. Social History   Socioeconomic History   Marital  status: Married    Spouse name: gail   Number of children: 2   Years of education: Not on file   Highest education level: Master's degree (e.g., MA, MS, MEng, MEd, MSW, MBA)  Occupational History   Not on file  Tobacco Use   Smoking status: Former    Types: Cigarettes    Quit date: 04/20/1958    Years since quitting: 64.3   Smokeless tobacco: Never  Vaping Use   Vaping Use: Never used  Substance and Sexual Activity   Alcohol use: Yes    Alcohol/week: 1.0 standard drink of alcohol    Types: 1 Glasses of wine per week   Drug use: Never   Sexual activity: Not Currently  Other Topics Concern   Not on file  Social  History Narrative   Not on file   Social Determinants of Health   Financial Resource Strain: Low Risk  (04/20/2018)   Overall Financial Resource Strain (CARDIA)    Difficulty of Paying Living Expenses: Not hard at all  Food Insecurity: No Food Insecurity (04/20/2018)   Hunger Vital Sign    Worried About Running Out of Food in the Last Year: Never true    Ran Out of Food in the Last Year: Never true  Transportation Needs: No Transportation Needs (04/20/2018)   PRAPARE - Administrator, Civil Service (Medical): No    Lack of Transportation (Non-Medical): No  Physical Activity: Insufficiently Active (04/20/2018)   Exercise Vital Sign    Days of Exercise per Week: 2 days    Minutes of Exercise per Session: 60 min  Stress: No Stress Concern Present (04/20/2018)   [employer] of Occupational Health - Occupational Stress Questionnaire    Feeling of Stress : Not at all  Social Connections: Unknown (04/20/2018)   Social Connection and Isolation Panel [NHANES]    Frequency of Communication with Friends and Family: Not on file    Frequency of Social Gatherings with Friends and Family: Not on file    Attends Religious Services: More than 4 times per year    Active Member of [employer] or Organizations: Yes    Attends Banker Meetings: More than 4 times per year    Marital Status: Married    Allergies:  Allergies  Allergen Reactions   Aricept [Donepezil]     Hallucination, confusion   Ciprofloxacin Other (See Comments)   Erythromycin Swelling   Keflex [Cephalexin] Swelling   Penicillins Swelling    Has patient had a PCN reaction causing immediate rash, facial/tongue/throat swelling, SOB or lightheadedness with hypotension: Yes Has patient had a PCN reaction causing severe rash involving mucus membranes or skin necrosis: No Has patient had a PCN reaction that required hospitalization: Unknown Has patient had a PCN reaction occurring within the last 10 years: No If  all of the above answers are "NO", then may proceed with Cephalosporin use.     Metabolic Disorder Labs: No results found for: "HGBA1C", "MPG" No results found for: "PROLACTIN" No results found for: "CHOL", "TRIG", "HDL", "CHOLHDL", "VLDL", "LDLCALC" Lab Results  Component Value Date   TSH 0.866 02/21/2018    Therapeutic Level Labs: No results found for: "LITHIUM" No results found for: "VALPROATE" No results found for: "CBMZ"  Current Medications: Current Outpatient Medications  Medication Sig Dispense Refill   apixaban (ELIQUIS) 2.5 MG TABS tablet Take 1 tablet by mouth every 12 (twelve) hours.     B Complex Vitamins (VITAMIN B COMPLEX) TABS Take by mouth.  betamethasone dipropionate 0.05 % cream Apply topically 2 (two) times daily. 30 g 0   buPROPion (WELLBUTRIN XL) 150 MG 24 hr tablet Take 1 tablet by mouth daily.     Clotrimazole 1 % OINT Apply twice daily x7 days. 56.7 g 0   Coenzyme Q10 (CO Q 10 PO) Take 1 Dose by mouth daily.      Cranberry 200 MG CAPS Take 200 mg by mouth daily.     Cyanocobalamin (B-12) 5000 MCG CAPS Take 5,000 mg by mouth daily. (Patient taking differently: Take 5,000 mcg by mouth daily.) 30 capsule 0   docusate sodium (COLACE) 100 MG capsule Take 100 mg by mouth 3 (three) times daily as needed for mild constipation.     esomeprazole (NEXIUM) 40 MG capsule Take 1 capsule by mouth daily.     finasteride (PROSCAR) 5 MG tablet Take 1 tablet (5 mg total) by mouth daily. 90 tablet 3   meclizine (ANTIVERT) 25 MG tablet Take 1 tablet (25 mg total) by mouth 3 (three) times daily as needed for dizziness. 30 tablet 0   memantine (NAMENDA) 5 MG tablet 5 mg 2 (two) times daily.     niacinamide 100 MG tablet Take 100 mg by mouth in the morning.     ondansetron (ZOFRAN-ODT) 4 MG disintegrating tablet Take 4 mg by mouth every 8 (eight) hours as needed.     simvastatin (ZOCOR) 40 MG tablet Take 40 mg by mouth at bedtime.      sucralfate (CARAFATE) 1 g tablet Take by  mouth.     tamsulosin (FLOMAX) 0.4 MG CAPS capsule Take 1 capsule (0.4 mg total) by mouth daily. 90 capsule 3   triamcinolone (KENALOG) 0.1 % paste 1 Application 2 (two) times daily.     FLUoxetine (PROZAC) 20 MG capsule TAKE 1 CAPSULE BY MOUTH ONCE DAILY. TO BE COMBINED WITH 40MG . 90 capsule 1   FLUoxetine (PROZAC) 40 MG capsule TAKE 1 CAPSULE (40 MG) BY MOUTH EVERY DAY - COMBINE WITH 20 MG = 60 MG TOTAL 90 capsule 1   QUEtiapine (SEROQUEL) 25 MG tablet Take 0.5-1 tablets (12.5-25 mg total) by mouth at bedtime as needed. For sleep, psychosis 30 tablet 1   sulfamethoxazole-trimethoprim (BACTRIM DS) 800-160 MG tablet Take 1 tablet by mouth every 12 (twelve) hours. 14 tablet 0   Current Facility-Administered Medications  Medication Dose Route Frequency Provider Last Rate Last Admin   nystatin (MYCOSTATIN/NYSTOP) topical powder   Topical BID Vanna Scotland, MD         Musculoskeletal: Strength & Muscle Tone:  limited, in a wheel chair Gait & Station:  seated Patient leans: N/A  Psychiatric Specialty Exam: Review of Systems  Musculoskeletal:        Shoulder pain right, knee pain - left  Psychiatric/Behavioral:  Positive for sleep disturbance.     Blood pressure 129/74, pulse 80, temperature (!) 97.2 F (36.2 C), temperature source Skin, height 6\' 4"  (1.93 m).Body mass index is 29.21 kg/m.  General Appearance: Casual  Eye Contact:  Fair  Speech:  Clear and Coherent  Volume:  Normal  Mood:  Euthymic  Affect:  Congruent  Thought Process:  Goal Directed and Descriptions of Associations: Intact  Orientation:  Other:  day, year , month, situation  Thought Content: Logical   Suicidal Thoughts:  No  Homicidal Thoughts:  No  Memory:  Immediate;   Fair Recent;   Fair Remote;   limited  Judgement:  Fair  Insight:  Fair  Psychomotor Activity:  Normal  Concentration:  Concentration: Fair and Attention Span: Fair  Recall:  [employer] of Knowledge: Fair  Language: Fair  Akathisia:  No   Handed:  Right  AIMS (if indicated): not done  Assets:  Communication Skills Desire for Improvement Housing Intimacy Social Support Talents/Skills  ADL's:  Intact  Cognition: WNL  Sleep:  Poor   Screenings: Geneticist, molecular Office Visit from 02/05/2022 in [facility]  AIMS Total Score 0      GAD-7    Flowsheet Row Office Visit from 07/30/2022 in [facility]  Total GAD-7 Score 1      Mini-Mental    Flowsheet Row Office Visit from 07/30/2022 in [facility]  Total Score (max 30 points ) 29      PHQ2-9    Flowsheet Row Office Visit from 07/30/2022 in [facility] Video Visit from 03/05/2022 in [facility] Office Visit from 07/21/2021 in [facility] Office Visit from 12/10/2020 in [facility]  PHQ-2 Total Score 0 0 0 0  PHQ-9 Total Score 1 -- 0 --      Flowsheet Row Office Visit from 07/30/2022 in [facility] Video Visit from 03/05/2022 in [facility] Office Visit from 01/22/2022 in [facility]  C-SSRS RISK CATEGORY No Risk No Risk No Risk        Assessment and Plan: Mark [name]. Is a 82 year old Caucasian male, lives in [place], has a history of depression, recent episodes of confusion, sleep problems likely due to recurrent UTI, will benefit from the following plan.  Plan MDD in full remission Wellbutrin XL 150 mg p.o. daily Prozac 60 mg p.o. daily  Insomnia-unstable Patient likely with changes in his sleep pattern past few days unknown if due to UTI. Patient will need evaluation and management if he does have a UTI, patient to follow-up with his primary  provider today. Will start low-dose Seroquel 12.5-25 mg at bedtime as needed.  Psychosis unspecified-likely due to UTI-unstable Patient observed in session today as pleasant, cooperative although per collateral information provided by spouse patient likely responding to internal stimuli especially at night, talking out loud and being confused. Will start low-dose Seroquel 12.5-25 mg at bedtime as needed.  MCI-patient to follow-up with neurology.  Currently on Namenda. Patient completed MMSE in session today with this provider-scored 29 out of 30.  Collateral information obtained from spouse as noted above.  Collaboration of Care: Collaboration of Care: Other patient to follow up with primary care provider for current possibility of UTI.  Patient/Guardian was advised Release of Information must be obtained prior to any record release in order to collaborate their care with an outside provider. Patient/Guardian was advised if they have not already done so to contact the registration department to sign all necessary forms in order for [place] to release information regarding their care.   Consent: Patient/Guardian gives verbal consent for treatment and assignment of benefits for services provided during this visit. Patient/Guardian expressed understanding and agreed to proceed.   This note was generated in part or whole with voice recognition software. Voice recognition is usually quite accurate but there are transcription errors that can and very often do occur. I apologize for any typographical errors that were not detected and corrected.  Jomarie Longs, MD 07/31/2022, 12:58 PM

## 2022-07-30 NOTE — Progress Notes (Signed)
07/30/2022 4:42 PM   Mark [name]. [birth year] [id number]  Referring provider: Barbette Reichmann, MD [address] [facility] [place],  [place] [address]  Urological history: 1. 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 BPH with LU TS  -aged out of prostate cancer screening  -Tamsulosin 0.4 mg daily and finasteride 5 mg daily   3.  Phimosis  -Mycolog cream    Chief Complaint  Patient presents with   Follow-up   Urinary Tract Infection    HPI: Mark [name]. is a 82 y.o. male who presents today for hallucinations, being panicked and urinary frequency.  Previous records reviewed.   Mark [name] and his wife presented to our office asking if he could be seen to rule out possible UTI for his symptoms of hallucination, being panicked and urinary frequency.  Mark [name] states he has noticed a little fogginess, but he denies any urinary symptoms.  [name] states that this is only way that she knows he has urinary tract infection is when he starts having issues with concentration and not acting like himself.  UA yellow slightly cloudy, specific gravity 1.025, trace blood, pH 6.0, nitrate positive, leukocyte +1, 11-30 WBCs, 3-10 RBCs, 0-10 epithelial cells, hyaline cast present, mucus threads present and many bacteria.  PVR 5 mL   PMH: Past Medical History:  Diagnosis Date   Anxiety    Atrial fibrillation (HCC)    Back pain, chronic    Cancer (HCC)    skin   Colon polyp    Cyst of left kidney    GERD (gastroesophageal reflux disease)    Heart murmur    Hyperlipemia    Mitral valve prolapse    Neuromuscular disorder (HCC)    Neuropathy    Osteoarthritis    Pulmonary emboli (HCC)    Spinal stenosis    Thyroid nodule    Vertigo     Surgical History: Past Surgical History:  Procedure Laterality Date   APPENDECTOMY     BACK SURGERY     COLONOSCOPY N/A 08/15/2014   Procedure: COLONOSCOPY;  Surgeon:  Scot Jun, MD;  Location: [facility] ENDOSCOPY;  Service: Endoscopy;  Laterality: N/A;   ESOPHAGOGASTRODUODENOSCOPY N/A 08/15/2014   Procedure: ESOPHAGOGASTRODUODENOSCOPY (EGD);  Surgeon: Scot Jun, MD;  Location: [facility] ENDOSCOPY;  Service: Endoscopy;  Laterality: N/A;   TONSILLECTOMY      Home Medications:  Allergies as of 07/30/2022       Reactions   Aricept [donepezil]    Hallucination, confusion   Ciprofloxacin Other (See Comments)   Erythromycin Swelling   Keflex [cephalexin] Swelling   Penicillins Swelling   Has patient had a PCN reaction causing immediate rash, facial/tongue/throat swelling, SOB or lightheadedness with hypotension: Yes Has patient had a PCN reaction causing severe rash involving mucus membranes or skin necrosis: No Has patient had a PCN reaction that required hospitalization: Unknown Has patient had a PCN reaction occurring within the last 10 years: No If all of the above answers are "NO", then may proceed with Cephalosporin use.        Medication List        Accurate as of Jul 30, 2022  4:42 PM. If you have any questions, ask your nurse or doctor.          B-12 5000 MCG Caps Take 5,000 mg by mouth daily. What changed: how much to take  betamethasone dipropionate 0.05 % cream Apply topically 2 (two) times daily.   buPROPion 150 MG 24 hr tablet Commonly known as: WELLBUTRIN XL Take 1 tablet by mouth daily. What changed: Another medication with the same name was removed. Continue taking this medication, and follow the directions you see here. Changed by: Jomarie Longs, MD   Clotrimazole 1 % Oint Apply twice daily x7 days.   CO Q 10 PO Take 1 Dose by mouth daily.   Cranberry 200 MG Caps Take 200 mg by mouth daily.   docusate sodium 100 MG capsule Commonly known as: COLACE Take 100 mg by mouth 3 (three) times daily as needed for mild constipation.   Eliquis 2.5 MG Tabs tablet Generic drug: apixaban Take 1 tablet by mouth every 12  (twelve) hours. What changed: Another medication with the same name was removed. Continue taking this medication, and follow the directions you see here. Changed by: Jomarie Longs, MD   esomeprazole 40 MG capsule Commonly known as: NEXIUM Take 1 capsule by mouth daily. What changed: Another medication with the same name was removed. Continue taking this medication, and follow the directions you see here. Changed by: Jomarie Longs, MD   finasteride 5 MG tablet Commonly known as: PROSCAR Take 1 tablet (5 mg total) by mouth daily.   FLUoxetine 20 MG capsule Commonly known as: PROZAC TAKE 1 CAPSULE BY MOUTH ONCE DAILY. TO BE COMBINED WITH 40MG .   FLUoxetine 40 MG capsule Commonly known as: PROZAC TAKE 1 CAPSULE (40 MG) BY MOUTH EVERY DAY - COMBINE WITH 20 MG = 60 MG TOTAL   meclizine 25 MG tablet Commonly known as: ANTIVERT Take 1 tablet (25 mg total) by mouth 3 (three) times daily as needed for dizziness.   memantine 5 MG tablet Commonly known as: NAMENDA 5 mg 2 (two) times daily.   niacinamide 100 MG tablet Take 100 mg by mouth in the morning.   ondansetron 4 MG disintegrating tablet Commonly known as: ZOFRAN-ODT Take 4 mg by mouth every 8 (eight) hours as needed.   QUEtiapine 25 MG tablet Commonly known as: SEROquel Take 0.5-1 tablets (12.5-25 mg total) by mouth at bedtime as needed. For sleep, psychosis What changed:  how much to take additional instructions Changed by: Jomarie Longs, MD   simvastatin 40 MG tablet Commonly known as: ZOCOR Take 40 mg by mouth at bedtime.   sucralfate 1 g tablet Commonly known as: CARAFATE Take by mouth.   sulfamethoxazole-trimethoprim 800-160 MG tablet Commonly known as: BACTRIM DS Take 1 tablet by mouth every 12 (twelve) hours. Started by: Michiel Cowboy, PA-C   tamsulosin 0.4 MG Caps capsule Commonly known as: FLOMAX Take 1 capsule (0.4 mg total) by mouth daily.   triamcinolone 0.1 % paste Commonly known as:  KENALOG 1 Application 2 (two) times daily.   Vitamin B Complex Tabs Take by mouth. What changed: Another medication with the same name was removed. Continue taking this medication, and follow the directions you see here. Changed by: Jomarie Longs, MD        Allergies:  Allergies  Allergen Reactions   Aricept [Donepezil]     Hallucination, confusion   Ciprofloxacin Other (See Comments)   Erythromycin Swelling   Keflex [Cephalexin] Swelling   Penicillins Swelling    Has patient had a PCN reaction causing immediate rash, facial/tongue/throat swelling, SOB or lightheadedness with hypotension: Yes Has patient had a PCN reaction causing severe rash involving mucus membranes or skin necrosis: No Has patient had a PCN  reaction that required hospitalization: Unknown Has patient had a PCN reaction occurring within the last 10 years: No If all of the above answers are "NO", then may proceed with Cephalosporin use.     Family History: Family History  Problem Relation Age of Onset   Stroke Mother    COPD Father     Social History:  reports that he quit smoking about 64 years ago. His smoking use included cigarettes. He has never used smokeless tobacco. He reports current alcohol use of about 1.0 standard drink of alcohol per week. He reports that he does not use drugs.  ROS: Pertinent ROS in HPI  Physical Exam: BP 118/64   Pulse 89   Temp 97.9 F (36.6 C)   Ht 6\' 4"  (1.93 m)   Wt 240 lb (108.9 kg)   BMI 29.21 kg/m   Constitutional:  Well nourished. Alert and oriented, No acute distress. HEENT: [place] AT, moist mucus membranes.  Trachea midline Cardiovascular: No clubbing, cyanosis, or edema. Respiratory: Normal respiratory effort, no increased work of breathing. Neurologic: Grossly intact, no focal deficits, moving all 4 extremities. Psychiatric: Normal mood and affect.  Laboratory Data: Serum creatinine 1.0 on May 26, 2022 with a GFR of 75 Hemoglobin A1c of 5.6 on May 26, 2022  Urinalysis  See EPIC and HPI I have reviewed the labs.   Pertinent Imaging:  07/30/22 16:03  Scan Result 5 ml    Assessment & Plan:    1. Suspected UTI -UA grossly infected -urine sent for culture -Given Septra DS twice daily until culture is available, will adjust if necessary  2. Microscopic hematuria -UA with micro heme -Explained that I need to recheck his urine in approximately 2 months to ensure the microscopic hematuria resolves  Return in about 2 months (around 09/29/2022) for UA and symptom recheck .  These notes generated with voice recognition software. I apologize for typographical errors.  Cloretta Ned  [facility] [address]  [address] [place], [place] [address] [phone]

## 2022-07-31 LAB — URINALYSIS, COMPLETE
Bilirubin, UA: NEGATIVE
Glucose, UA: NEGATIVE
Ketones, UA: NEGATIVE
Nitrite, UA: POSITIVE — AB
Protein,UA: NEGATIVE
Specific Gravity, UA: 1.025 (ref 1.005–1.030)
Urobilinogen, Ur: 1 mg/dL (ref 0.2–1.0)
pH, UA: 6 (ref 5.0–7.5)

## 2022-07-31 LAB — MICROSCOPIC EXAMINATION

## 2022-08-01 ENCOUNTER — Other Ambulatory Visit: Payer: Self-pay

## 2022-08-01 ENCOUNTER — Emergency Department: Payer: Medicare Other

## 2022-08-01 ENCOUNTER — Emergency Department
Admission: EM | Admit: 2022-08-01 | Discharge: 2022-08-02 | Disposition: A | Discharge status: Home / Self Care | Payer: Medicare Other | Source: Home / Self Care | Attending: Emergency Medicine | Admitting: Emergency Medicine

## 2022-08-01 DIAGNOSIS — Z85828 Personal history of other malignant neoplasm of skin: Secondary | ICD-10-CM | POA: Insufficient documentation

## 2022-08-01 DIAGNOSIS — W01198A Fall on same level from slipping, tripping and stumbling with subsequent striking against other object, initial encounter: Secondary | ICD-10-CM | POA: Insufficient documentation

## 2022-08-01 DIAGNOSIS — I951 Orthostatic hypotension: Secondary | ICD-10-CM | POA: Diagnosis not present

## 2022-08-01 DIAGNOSIS — F0390 Unspecified dementia without behavioral disturbance: Secondary | ICD-10-CM | POA: Insufficient documentation

## 2022-08-01 DIAGNOSIS — Z7901 Long term (current) use of anticoagulants: Secondary | ICD-10-CM | POA: Insufficient documentation

## 2022-08-01 DIAGNOSIS — R1110 Vomiting, unspecified: Secondary | ICD-10-CM | POA: Insufficient documentation

## 2022-08-01 DIAGNOSIS — S0990XA Unspecified injury of head, initial encounter: Secondary | ICD-10-CM | POA: Insufficient documentation

## 2022-08-01 DIAGNOSIS — W19XXXA Unspecified fall, initial encounter: Secondary | ICD-10-CM

## 2022-08-01 HISTORY — DX: Unspecified dementia, unspecified severity, without behavioral disturbance, psychotic disturbance, mood disturbance, and anxiety: F03.90

## 2022-08-01 MED ORDER — ONDANSETRON 4 MG PO TBDP: 4.0000 mg | ORAL_TABLET | Freq: Three times a day (TID) | ORAL | 0 refills | Status: DC | PRN

## 2022-08-01 MED ORDER — ONDANSETRON 4 MG PO TBDP
4.0000 mg | ORAL_TABLET | Freq: Once | ORAL | Status: AC
  Administered 2022-08-02: 4 mg via ORAL
  Filled 2022-08-01: qty 1

## 2022-08-01 NOTE — ED Triage Notes (Signed)
Pt states fell and hit head; shoulder pain initially but no longer; denies LOC; is on Eliquis; pt denies headache; pt's speech is clear; provider CBT at bedside; pt's wife at bedside.

## 2022-08-01 NOTE — ED Provider Triage Note (Signed)
Emergency Medicine Provider Triage Evaluation Note  Mark [name]. , a 82 y.o. male  was evaluated in triage.  Pt presents to the ER after mechanical, non-syncopal fall at home. He fell backward and landed on his right shoulder and hit his head. He denies any concerns, but is on Eliquis and family wanted him to come in for scans.  Physical Exam  There were no vitals taken for this visit. Gen:   Awake, no distress   Resp:  Normal effort  MSK:   Moves extremities without difficulty  Other:    Medical Decision Making  Medically screening exam initiated at 6:56 PM.  Appropriate orders placed.  Mark [name]. was informed that the remainder of the evaluation will be completed by another provider, this initial triage assessment does not replace that evaluation, and the importance of remaining in the ED until their evaluation is complete.  CT head and cervical spine ordered.   Mark Pester, FNP 08/01/22 1858

## 2022-08-01 NOTE — ED Triage Notes (Signed)
First Nurse Note:  Pt via EMS from home. Pt has been being treated with a UTI. EMS was called out of assistance, went to  140/70 BP  100 HR

## 2022-08-01 NOTE — ED Notes (Signed)
Pt arrived to ED bed 12H pt was dry heaving at that time, pt denies any nausea or abdominal pain at this time.

## 2022-08-01 NOTE — ED Provider Notes (Signed)
[facility] Provider Note    Event Date/Time   First MD Initiated Contact with Patient 08/01/22 2349     (approximate)   History   Fall   HPI  Mark [name]. is a 82 y.o. male brought to the ED via EMS from home with a chief complaint of fall on Eliquis.  Patient had a mechanical fall and struck his head against a dog crate, denies LOC.  Denies vision changes, headache, neck pain, chest pain, shortness of breath, dizziness.  Vomited x 1.  Currently being treated for UTI.     Past Medical History   Past Medical History:  Diagnosis Date   Anxiety    Atrial fibrillation (HCC)    Back pain, chronic    Cancer (HCC)    skin   Colon polyp    Cyst of left kidney    Dementia (HCC)    GERD (gastroesophageal reflux disease)    Heart murmur    Hyperlipemia    Mitral valve prolapse    Neuromuscular disorder (HCC)    Neuropathy    Osteoarthritis    Pulmonary emboli (HCC)    Spinal stenosis    Thyroid nodule    Vertigo      Active Problem List   Patient Active Problem List   Diagnosis Date Noted   Psychosis (HCC) 01/22/2022   At risk for prolonged QT interval syndrome 01/22/2022   MCI (mild cognitive impairment) 07/21/2021   COVID-19 10/14/2020   MDD (major depressive disorder), recurrent, in full remission (HCC) 07/12/2020   MDD (major depressive disorder), recurrent episode, mild (HCC) 11/04/2018   Insomnia due to medical condition 11/04/2018   Fall 03/02/2018   Pleural effusion    Weakness 02/22/2018   Nausea 02/21/2018   Near syncope 10/24/2017   Orthostatic hypotension 10/24/2017   History of pulmonary embolus (PE) 08/12/2017   PAF (paroxysmal atrial fibrillation) (HCC) 08/12/2017   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   Abnormal cardiovascular function study 01/07/2017   Lightheadedness 12/23/2016   Lower extremity numbness 12/23/2016   Orthostatic dizziness 12/23/2016   Weakness of both lower extremities  12/23/2016   Arthritis 11/17/2016   SOB (shortness of breath) 11/16/2016   DDD (degenerative disc disease), lumbar 07/01/2016   Lumbosacral spondylosis without myelopathy 01/07/2016   Chronic bilateral low back pain without sciatica 10/10/2015   Primary osteoarthritis of right shoulder 06/25/2015   H/O adenomatous polyp of colon 07/20/2014   Chronic anticoagulation 10/04/2012   Anxiety 09/11/2011   Cyst of left kidney 09/11/2011   GERD (gastroesophageal reflux disease) 09/11/2011   Heart murmur 09/11/2011   Hyperlipidemia 09/11/2011   Neuropathy 09/11/2011   Pulmonary embolus (HCC) 09/11/2011   Back pain, chronic 09/11/2011     Past Surgical History   Past Surgical History:  Procedure Laterality Date   APPENDECTOMY     BACK SURGERY     COLONOSCOPY N/A 08/15/2014   Procedure: COLONOSCOPY;  Surgeon: Scot Jun, MD;  Location: [facility] ENDOSCOPY;  Service: Endoscopy;  Laterality: N/A;   ESOPHAGOGASTRODUODENOSCOPY N/A 08/15/2014   Procedure: ESOPHAGOGASTRODUODENOSCOPY (EGD);  Surgeon: Scot Jun, MD;  Location: [facility] ENDOSCOPY;  Service: Endoscopy;  Laterality: N/A;   TONSILLECTOMY       Home Medications   Prior to Admission medications   Medication Sig Start Date End Date Taking? Authorizing Provider  ondansetron (ZOFRAN-ODT) 4 MG disintegrating tablet Take 1 tablet (4 mg total) by mouth every 8 (eight) hours as needed for nausea or  vomiting. 08/01/22  Yes Irean Hong, MD  apixaban (ELIQUIS) 2.5 MG TABS tablet Take 1 tablet by mouth every 12 (twelve) hours. 06/25/22   [provider]  B Complex Vitamins (VITAMIN B COMPLEX) TABS Take by mouth.    [provider]  betamethasone dipropionate 0.05 % cream Apply topically 2 (two) times daily. 09/25/21   McGowan, Carollee Herter A, PA-C  buPROPion (WELLBUTRIN XL) 150 MG 24 hr tablet Take 1 tablet by mouth daily. 07/02/22   [provider]  Clotrimazole 1 % OINT Apply twice daily x7 days. 11/04/21   Vaillancourt,  Lelon Mast, PA-C  Coenzyme Q10 (CO Q 10 PO) Take 1 Dose by mouth daily.     [provider]  Cranberry 200 MG CAPS Take 200 mg by mouth daily.    [provider]  Cyanocobalamin (B-12) 5000 MCG CAPS Take 5,000 mg by mouth daily. Patient taking differently: Take 5,000 mcg by mouth daily. 02/22/18   Ihor Austin, MD  docusate sodium (COLACE) 100 MG capsule Take 100 mg by mouth 3 (three) times daily as needed for mild constipation.    [provider]  esomeprazole (NEXIUM) 40 MG capsule Take 1 capsule by mouth daily. 07/09/22   [provider]  finasteride (PROSCAR) 5 MG tablet Take 1 tablet (5 mg total) by mouth daily. 12/24/21   Vanna Scotland, MD  FLUoxetine (PROZAC) 20 MG capsule TAKE 1 CAPSULE BY MOUTH ONCE DAILY. TO BE COMBINED WITH 40MG . 07/30/22   Jomarie Longs, MD  FLUoxetine (PROZAC) 40 MG capsule TAKE 1 CAPSULE (40 MG) BY MOUTH EVERY DAY - COMBINE WITH 20 MG = 60 MG TOTAL 07/30/22   Jomarie Longs, MD  meclizine (ANTIVERT) 25 MG tablet Take 1 tablet (25 mg total) by mouth 3 (three) times daily as needed for dizziness. 12/01/20   Minna Antis, MD  memantine (NAMENDA) 5 MG tablet 5 mg 2 (two) times daily. 07/09/22   [provider]  niacinamide 100 MG tablet Take 100 mg by mouth in the morning.    [provider]  QUEtiapine (SEROQUEL) 25 MG tablet Take 0.5-1 tablets (12.5-25 mg total) by mouth at bedtime as needed. For sleep, psychosis 07/30/22 09/28/22  Jomarie Longs, MD  simvastatin (ZOCOR) 40 MG tablet Take 40 mg by mouth at bedtime.     [provider]  sucralfate (CARAFATE) 1 g tablet Take by mouth. 01/06/22 01/06/23  [provider]  sulfamethoxazole-trimethoprim (BACTRIM DS) 800-160 MG tablet Take 1 tablet by mouth every 12 (twelve) hours. 07/30/22   Michiel Cowboy A, PA-C  tamsulosin (FLOMAX) 0.4 MG CAPS capsule Take 1 capsule (0.4 mg total) by mouth daily. 12/24/21   Vanna Scotland, MD  triamcinolone (KENALOG)  0.1 % paste 1 Application 2 (two) times daily. 02/04/22   [provider]     Allergies  Aricept [donepezil], Ciprofloxacin, Erythromycin, Keflex [cephalexin], and Penicillins   Family History   Family History  Problem Relation Age of Onset   Stroke Mother    COPD Father      Physical Exam  Triage Vital Signs: ED Triage Vitals  Enc Vitals Group     BP 08/01/22 1857 121/73     Pulse Rate 08/01/22 1857 94     Resp 08/01/22 1857 20     Temp 08/01/22 1857 98.6 F (37 C)     Temp Source 08/01/22 1857 Oral     SpO2 08/01/22 1857 91 %     Weight 08/01/22 1901 240 lb (108.9 kg)  Height 08/01/22 1901 6\' 4"  (1.93 m)     Head Circumference --      Peak Flow --      Pain Score 08/01/22 1901 0     Pain Loc --      Pain Edu? --      Excl. in GC? --     Updated Vital Signs: BP (!) 152/93 (BP Location: Left Arm)   Pulse 94   Temp 98.6 F (37 C) (Oral)   Resp 18   Ht 6\' 4"  (1.93 m)   Wt 108.9 kg   SpO2 94%   BMI 29.21 kg/m    General: Awake, no distress.  CV:  RRR.  Good peripheral perfusion.  Resp:  Normal effort.  CTAB. Abd:  No distention.  Other:  Head is atraumatic.  PERRL.  EOMI.  Nose is atraumatic.  No dental malocclusion.  No midline cervical spine tenderness to palpation, step-offs or deformities.  Pelvis is stable.  Full range of motion both hips without pain.   ED Results / Procedures / Treatments  Labs (all labs ordered are listed, but only abnormal results are displayed) Labs Reviewed - No data to display   EKG  None   RADIOLOGY I have independently visualized and interpreted patient's CT scans as well as noted the radiology interpretation:  CT head: No ICH  CT cervical spine: No acute osseous injuries  Official radiology report(s): CT Cervical Spine Wo Contrast  Result Date: 08/01/2022 CLINICAL DATA:  Status post fall. EXAM: CT CERVICAL SPINE WITHOUT CONTRAST TECHNIQUE: Multidetector CT imaging of the cervical spine was  performed without intravenous contrast. Multiplanar CT image reconstructions were also generated. RADIATION DOSE REDUCTION: This exam was performed according to the departmental dose-optimization program which includes automated exposure control, adjustment of the mA and/or kV according to patient size and/or use of iterative reconstruction technique. COMPARISON:  January 16, 2022 FINDINGS: Alignment: There is stable, chronic, approximately 4 mm anterolisthesis of the C3 vertebral body on C4. Stable, chronic anterolisthesis of C7 on T1 is also noted. Skull base and vertebrae: No acute fracture. No primary bone lesion or focal pathologic process. Degenerative changes are seen involving the tip of the dens and the adjacent portion of the anterior arch of C1. Soft tissues and spinal canal: No prevertebral fluid or swelling. No visible canal hematoma. Disc levels: Marked severity endplate sclerosis, anterior osteophyte formation and posterior bony spurring are seen at the levels of C4-C5, C5-C6, C6-C7 and C7-T1. There is marked severity narrowing of the anterior atlantoaxial articulation. Marked severity intervertebral disc space narrowing is seen at C4-C5, C5-C6, C6-C7 and C7-T1. Bilateral marked severity multilevel facet joint hypertrophy is noted. Upper chest: Emphysematous lung disease is seen within the bilateral upper lobes. Other: The left lobe of the thyroid gland is enlarged, lobulated and heterogeneous in appearance. IMPRESSION: 1. No acute fracture or traumatic subluxation of the cervical spine. 2. Stable, chronic, 4 mm anterolisthesis of the C3 vertebral body on C4. 3. Stable, chronic anterolisthesis of C7 on T1. 4. Marked severity multilevel degenerative changes, as described above. 5. Emphysematous lung disease. 6. Enlarged, lobulated and heterogeneous left lobe of the thyroid gland. Correlation with nonemergent thyroid ultrasound is recommended. Emphysema (ICD10-J43.9). Electronically Signed   By:  Aram Candela M.D.   On: 08/01/2022 20:02   CT Head Wo Contrast  Result Date: 08/01/2022 CLINICAL DATA:  Head trauma EXAM: CT HEAD WITHOUT CONTRAST TECHNIQUE: Contiguous axial images were obtained from the base of the skull through the  vertex without intravenous contrast. RADIATION DOSE REDUCTION: This exam was performed according to the departmental dose-optimization program which includes automated exposure control, adjustment of the mA and/or kV according to patient size and/or use of iterative reconstruction technique. COMPARISON:  Head CT 01/16/2022 FINDINGS: Brain: No evidence of acute infarction, hemorrhage, hydrocephalus, extra-axial collection or mass lesion/mass effect. There is stable moderate patchy periventricular and deep white matter hypodensity. There is stable moderate diffuse atrophy. Vascular: Atherosclerotic calcifications are present within the cavernous internal carotid arteries. Skull: Normal. Negative for fracture or focal lesion. Sinuses/Orbits: No acute finding. Other: None. IMPRESSION: 1. No acute intracranial abnormality. 2. 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 Electronically Signed   By: Darliss Cheney M.D.   On: 08/01/2022 19:53     PROCEDURES:  Critical Care performed: No  Procedures   MEDICATIONS ORDERED IN ED: Medications  ondansetron (ZOFRAN-ODT) disintegrating tablet 4 mg (has no administration in time range)     IMPRESSION / MDM / ASSESSMENT AND PLAN / ED COURSE  I reviewed the triage vital signs and the nursing notes.                             82 year old male who presents status post mechanical fall, on Eliquis.  Differential diagnosis includes but is not limited to ICH, SDH, cervical spine injury, etc.  I personally reviewed patient's records and note he had a urology office visit on 07/30/2022.  Patient's presentation is most consistent with acute presentation with potential threat to life or bodily  function.  Patient is neurologically intact without focal deficits.  CT head and cervical spine negative for acute traumatic injury.  Will administer Zofran for nausea.  Strict return precautions given.  Patient and spouse verbalized understanding and agree with plan of care.      FINAL CLINICAL IMPRESSION(S) / ED DIAGNOSES   Final diagnoses:  Fall, initial encounter  Minor head injury, initial encounter     Rx / DC Orders   ED Discharge Orders          Ordered    ondansetron (ZOFRAN-ODT) 4 MG disintegrating tablet  Every 8 hours PRN        08/01/22 2356             Note:  This document was prepared using Dragon voice recognition software and may include unintentional dictation errors.   Irean Hong, MD 08/02/22 [phone]

## 2022-08-01 NOTE — ED Notes (Signed)
Pt's wife reports pt is at his baseline confusion since dx with UTI and hx dementia. Pt is HOH. Pt A&Ox4.

## 2022-08-01 NOTE — Discharge Instructions (Addendum)
You may take Tylenol as needed for pain, Zofran as needed for nausea.  Return to the ER for worsening symptoms, persistent vomiting, lethargy or other concerns.

## 2022-08-02 ENCOUNTER — Emergency Department: Payer: Medicare Other

## 2022-08-02 ENCOUNTER — Inpatient Hospital Stay
Admission: EM | Admit: 2022-08-02 | Discharge: 2022-08-06 | DRG: 312 | Disposition: A | Discharge status: Skilled Nursing Facility | Payer: Medicare Other | Attending: Student | Admitting: Student

## 2022-08-02 DIAGNOSIS — I951 Orthostatic hypotension: Principal | ICD-10-CM | POA: Diagnosis present

## 2022-08-02 DIAGNOSIS — J9601 Acute respiratory failure with hypoxia: Secondary | ICD-10-CM | POA: Diagnosis present

## 2022-08-02 DIAGNOSIS — Z79899 Other long term (current) drug therapy: Secondary | ICD-10-CM

## 2022-08-02 DIAGNOSIS — R270 Ataxia, unspecified: Secondary | ICD-10-CM | POA: Diagnosis present

## 2022-08-02 DIAGNOSIS — M48061 Spinal stenosis, lumbar region without neurogenic claudication: Secondary | ICD-10-CM | POA: Diagnosis present

## 2022-08-02 DIAGNOSIS — Z823 Family history of stroke: Secondary | ICD-10-CM

## 2022-08-02 DIAGNOSIS — W19XXXA Unspecified fall, initial encounter: Secondary | ICD-10-CM | POA: Diagnosis not present

## 2022-08-02 DIAGNOSIS — H9190 Unspecified hearing loss, unspecified ear: Secondary | ICD-10-CM | POA: Diagnosis present

## 2022-08-02 DIAGNOSIS — F32A Depression, unspecified: Secondary | ICD-10-CM | POA: Diagnosis present

## 2022-08-02 DIAGNOSIS — Z9181 History of falling: Secondary | ICD-10-CM

## 2022-08-02 DIAGNOSIS — K219 Gastro-esophageal reflux disease without esophagitis: Secondary | ICD-10-CM | POA: Diagnosis present

## 2022-08-02 DIAGNOSIS — Z825 Family history of asthma and other chronic lower respiratory diseases: Secondary | ICD-10-CM

## 2022-08-02 DIAGNOSIS — E785 Hyperlipidemia, unspecified: Secondary | ICD-10-CM | POA: Diagnosis present

## 2022-08-02 DIAGNOSIS — Z87440 Personal history of urinary (tract) infections: Secondary | ICD-10-CM

## 2022-08-02 DIAGNOSIS — D6851 Activated protein C resistance: Secondary | ICD-10-CM | POA: Diagnosis present

## 2022-08-02 DIAGNOSIS — R296 Repeated falls: Secondary | ICD-10-CM | POA: Diagnosis present

## 2022-08-02 DIAGNOSIS — I2699 Other pulmonary embolism without acute cor pulmonale: Secondary | ICD-10-CM | POA: Diagnosis present

## 2022-08-02 DIAGNOSIS — E538 Deficiency of other specified B group vitamins: Secondary | ICD-10-CM | POA: Diagnosis present

## 2022-08-02 DIAGNOSIS — Z86711 Personal history of pulmonary embolism: Secondary | ICD-10-CM

## 2022-08-02 DIAGNOSIS — N3000 Acute cystitis without hematuria: Secondary | ICD-10-CM | POA: Diagnosis present

## 2022-08-02 DIAGNOSIS — W1830XA Fall on same level, unspecified, initial encounter: Secondary | ICD-10-CM | POA: Diagnosis present

## 2022-08-02 DIAGNOSIS — I480 Paroxysmal atrial fibrillation: Secondary | ICD-10-CM | POA: Diagnosis present

## 2022-08-02 DIAGNOSIS — Z880 Allergy status to penicillin: Secondary | ICD-10-CM

## 2022-08-02 DIAGNOSIS — Z888 Allergy status to other drugs, medicaments and biological substances status: Secondary | ICD-10-CM

## 2022-08-02 DIAGNOSIS — G629 Polyneuropathy, unspecified: Secondary | ICD-10-CM | POA: Diagnosis present

## 2022-08-02 DIAGNOSIS — S0990XD Unspecified injury of head, subsequent encounter: Secondary | ICD-10-CM

## 2022-08-02 DIAGNOSIS — S32029A Unspecified fracture of second lumbar vertebra, initial encounter for closed fracture: Secondary | ICD-10-CM | POA: Diagnosis present

## 2022-08-02 DIAGNOSIS — I341 Nonrheumatic mitral (valve) prolapse: Secondary | ICD-10-CM | POA: Diagnosis present

## 2022-08-02 DIAGNOSIS — R1110 Vomiting, unspecified: Secondary | ICD-10-CM | POA: Diagnosis present

## 2022-08-02 DIAGNOSIS — Z86010 Personal history of colonic polyps: Secondary | ICD-10-CM

## 2022-08-02 DIAGNOSIS — E860 Dehydration: Secondary | ICD-10-CM | POA: Diagnosis present

## 2022-08-02 DIAGNOSIS — R0902 Hypoxemia: Secondary | ICD-10-CM

## 2022-08-02 DIAGNOSIS — F0394 Unspecified dementia, unspecified severity, with anxiety: Secondary | ICD-10-CM | POA: Diagnosis present

## 2022-08-02 DIAGNOSIS — Z7901 Long term (current) use of anticoagulants: Secondary | ICD-10-CM

## 2022-08-02 DIAGNOSIS — N390 Urinary tract infection, site not specified: Secondary | ICD-10-CM | POA: Diagnosis present

## 2022-08-02 DIAGNOSIS — F419 Anxiety disorder, unspecified: Secondary | ICD-10-CM

## 2022-08-02 DIAGNOSIS — Y92009 Unspecified place in unspecified non-institutional (private) residence as the place of occurrence of the external cause: Secondary | ICD-10-CM

## 2022-08-02 DIAGNOSIS — F0393 Unspecified dementia, unspecified severity, with mood disturbance: Secondary | ICD-10-CM | POA: Diagnosis present

## 2022-08-02 DIAGNOSIS — Z1629 Resistance to other single specified antibiotic: Secondary | ICD-10-CM | POA: Diagnosis present

## 2022-08-02 DIAGNOSIS — Z881 Allergy status to other antibiotic agents status: Secondary | ICD-10-CM

## 2022-08-02 DIAGNOSIS — M549 Dorsalgia, unspecified: Secondary | ICD-10-CM | POA: Diagnosis present

## 2022-08-02 DIAGNOSIS — Z23 Encounter for immunization: Secondary | ICD-10-CM

## 2022-08-02 DIAGNOSIS — S0990XA Unspecified injury of head, initial encounter: Secondary | ICD-10-CM | POA: Diagnosis present

## 2022-08-02 DIAGNOSIS — Z85828 Personal history of other malignant neoplasm of skin: Secondary | ICD-10-CM

## 2022-08-02 DIAGNOSIS — N400 Enlarged prostate without lower urinary tract symptoms: Secondary | ICD-10-CM | POA: Diagnosis present

## 2022-08-02 DIAGNOSIS — G8929 Other chronic pain: Secondary | ICD-10-CM | POA: Diagnosis present

## 2022-08-02 LAB — COMPREHENSIVE METABOLIC PANEL
ALT: 16 U/L (ref 0–44)
AST: 25 U/L (ref 15–41)
Albumin: 4.1 g/dL (ref 3.5–5.0)
Alkaline Phosphatase: 66 U/L (ref 38–126)
Anion gap: 13 (ref 5–15)
BUN: 19 mg/dL (ref 8–23)
CO2: 22 mmol/L (ref 22–32)
Calcium: 9.2 mg/dL (ref 8.9–10.3)
Chloride: 102 mmol/L (ref 98–111)
Creatinine, Ser: 1.12 mg/dL (ref 0.61–1.24)
GFR, Estimated: >60 mL/min (ref >60)
Glucose, Bld: 114 mg/dL — ABNORMAL HIGH (ref 70–99)
Potassium: 4.2 mmol/L (ref 3.5–5.1)
Sodium: 137 mmol/L (ref 135–145)
Total Bilirubin: 0.9 mg/dL (ref 0.3–1.2)
Total Protein: 7.6 g/dL (ref 6.5–8.1)

## 2022-08-02 LAB — URINALYSIS, W/ REFLEX TO CULTURE (INFECTION SUSPECTED)
Bacteria, UA: NONE SEEN
Glucose, UA: NEGATIVE mg/dL
Hgb urine dipstick: NEGATIVE
Nitrite: NEGATIVE
Protein, ur: NEGATIVE mg/dL
Specific Gravity, Urine: >1.03 — ABNORMAL HIGH (ref 1.005–1.030)
pH: 5.5 (ref 5.0–8.0)

## 2022-08-02 LAB — CBC
HCT: 44.7 % (ref 39.0–52.0)
Hemoglobin: 15 g/dL (ref 13.0–17.0)
MCH: 30.9 pg (ref 26.0–34.0)
MCHC: 33.6 g/dL (ref 30.0–36.0)
MCV: 92.2 fL (ref 80.0–100.0)
Platelets: 225 10*3/uL (ref 150–400)
RBC: 4.85 MIL/uL (ref 4.22–5.81)
RDW: 13.4 % (ref 11.5–15.5)
WBC: 10.7 10*3/uL — ABNORMAL HIGH (ref 4.0–10.5)
nRBC: 0 % (ref 0.0–0.2)

## 2022-08-02 LAB — LACTIC ACID, PLASMA: Lactic Acid, Venous: 1.1 mmol/L (ref 0.5–1.9)

## 2022-08-02 MED ORDER — TETANUS-DIPHTH-ACELL PERTUSSIS 5-2.5-18.5 LF-MCG/0.5 IM SUSY
0.5000 mL | PREFILLED_SYRINGE | Freq: Once | INTRAMUSCULAR | Status: AC
  Administered 2022-08-02: 0.5 mL via INTRAMUSCULAR
  Filled 2022-08-02: qty 0.5

## 2022-08-02 MED ORDER — FOSFOMYCIN TROMETHAMINE 3 G PO PACK
3.0000 g | PACK | Freq: Once | ORAL | Status: AC
  Administered 2022-08-02: 3 g via ORAL
  Filled 2022-08-02: qty 3

## 2022-08-02 MED ORDER — OXYCODONE-ACETAMINOPHEN 5-325 MG PO TABS
1.0000 | ORAL_TABLET | Freq: Once | ORAL | Status: AC
  Administered 2022-08-02: 1 via ORAL
  Filled 2022-08-02: qty 1

## 2022-08-02 MED ORDER — SODIUM CHLORIDE 0.9 % IV BOLUS
1000.0000 mL | Freq: Once | INTRAVENOUS | Status: AC
  Administered 2022-08-02: 1000 mL via INTRAVENOUS

## 2022-08-02 NOTE — Assessment & Plan Note (Signed)
Pt is on eliquis for his PAF.

## 2022-08-02 NOTE — Assessment & Plan Note (Signed)
IV PPI. Aspiration precaution.  

## 2022-08-02 NOTE — Assessment & Plan Note (Signed)
Pending

## 2022-08-02 NOTE — ED Triage Notes (Signed)
Per daughter, pt has a UTI and has been falling a lot recently. Pt has been started on a abx since Thursday. Pt is on Bactrim. Pt also has neuropathy.

## 2022-08-02 NOTE — Assessment & Plan Note (Signed)
Suspect 2/2 orthostatic BP. We will obtain orthostatic vitals. Fall precaution. PT consult prior to discharge. 2:!5 am Orthostatic bp positive for orthostatic changes: Supine 146/83  / 85 Sitting 117/71  / 101 Standing 103/60 / 84 and 88% on RA. Pt orthostatic. We will monitor. Make medication changes.

## 2022-08-02 NOTE — ED Provider Notes (Signed)
[facility] Provider Note    Event Date/Time   First MD Initiated Contact with Patient 08/02/22 2042     (approximate)   History   Fall   HPI  Mark Bedient. is a 82 y.o. male past medical history significant for atrial fibrillation on Eliquis, GERD, hyperlipidemia, peripheral neuropathy, who presents to the emergency department following a fall.  Patient had a fall whenever he was sitting in bed and landed on the ground hitting his back and his head.  States that he has had multiple falls over the past 24 hours.  Evaluated yesterday in the emergency department following a fall.  States that he has generalized weakness and this is new over the past 2 days.  Does endorse ongoing urinary urgency and frequency.  Currently on antibiotics with Bactrim but feels like it is not helping.  Multiple allergies including cephalosporins, penicillins and ciprofloxacin.  Denies any abdominal pain.  Does endorse new back pain to the middle of his back.     Physical Exam   Triage Vital Signs: ED Triage Vitals  Enc Vitals Group     BP 08/02/22 1801 (!) 114/93     Pulse Rate 08/02/22 1801 96     Resp 08/02/22 1801 17     Temp 08/02/22 1801 98.9 F (37.2 C)     Temp Source 08/02/22 1801 Oral     SpO2 08/02/22 1801 (!) 88 %     Weight 08/02/22 1802 242 lb 8.1 oz (110 kg)     Height --      Head Circumference --      Peak Flow --      Pain Score 08/02/22 1802 7     Pain Loc --      Pain Edu? --      Excl. in GC? --     Most recent vital signs: Vitals:   08/02/22 2313 08/02/22 2317  BP: 103/60   Pulse: (S) 84 (S) 92  Resp: (S) 17   Temp: 98.7 F (37.1 C)   SpO2: (S) (!) 88% (S) 94%    Physical Exam Constitutional:      Appearance: He is well-developed.  HENT:     Head: Atraumatic.  Eyes:     Conjunctiva/sclera: Conjunctivae normal.  Cardiovascular:     Rate and Rhythm: Regular rhythm.  Pulmonary:     Effort: Respiratory distress present.      Comments: 88% on room air, speaking in full sentences.  No wheezing.  No focal rhonchi or rales. Musculoskeletal:     Cervical back: Normal range of motion.     Comments: Midline lumbar tenderness to palpation.  No CVA tenderness.  Skin:    General: Skin is warm.     Capillary Refill: Capillary refill takes less than 2 seconds.  Neurological:     Mental Status: He is alert. Mental status is at baseline.  Psychiatric:        Mood and Affect: Mood normal.     IMPRESSION / MDM / ASSESSMENT AND PLAN / ED COURSE  I reviewed the triage vital signs and the nursing notes.  On arrival to the emergency department patient is hypoxic to 88%.  Placed on 2 L nasal cannula.  Differential diagnosis including dehydration, urinary tract infection, intracranial hemorrhage, pneumonia, pyelonephritis, fracture  EKG  I, Corena Herter, the attending physician, personally viewed and interpreted this ECG.    No tachycardic or bradycardic dysrhythmias while on cardiac telemetry.  RADIOLOGY I independently reviewed imaging, my interpretation of imaging: Chest x-ray with no focal findings consistent with pneumonia.  Read as no acute findings.  X-ray of the lumbar spine with mild compression deformity to superior endplate of L2 which is new when compared to prior.  Patient does have midline tenderness.  Concern for acute fracture.  CT scan of the head and CT scan of the cervical spine read as no acute findings.  On my evaluation no signs of intracranial hemorrhage.  LABS (all labs ordered are listed, but only abnormal results are displayed) Labs interpreted as -    Labs Reviewed  CBC - Abnormal; Notable for the following components:      Result Value   WBC 10.7 (*)    All other components within normal limits  COMPREHENSIVE METABOLIC PANEL - Abnormal; Notable for the following components:   Glucose, Bld 114 (*)    All other components within normal limits  URINALYSIS, W/ REFLEX TO CULTURE  (INFECTION SUSPECTED) - Abnormal; Notable for the following components:   Color, Urine YELLOW (*)    Specific Gravity, Urine >1.030 (*)    Bilirubin Urine SMALL (*)    Ketones, ur TRACE (*)    Leukocytes,Ua TRACE (*)    All other components within normal limits  CULTURE, BLOOD (ROUTINE X 2)  CULTURE, BLOOD (ROUTINE X 2)  LACTIC ACID, PLASMA  LACTIC ACID, PLASMA     MDM  Patient with mild leukocytosis.  Blood cultures added on.  Initial lactic acid normal at 1.1.  UA concerning for possible urinary tract infection.  On chart review no recent urine culture results.  Discussed with pharmacy given his multiple allergies including cephalosporins, penicillins and fluoroquinolones, recommended doing one-time dose of fosfomycin p.o.  Ordered fosfomycin 3 g p.o.  Consulted hospitalist for admission for frequent falls with dehydration, urinary tract infection, and acute hypoxic respiratory failure.  New L2 fracture.   PROCEDURES:  Critical Care performed: No  Procedures  Patient's presentation is most consistent with acute presentation with potential threat to life or bodily function.   MEDICATIONS ORDERED IN ED: Medications  fosfomycin (MONUROL) packet 3 g (has no administration in time range)  sodium chloride 0.9 % bolus 1,000 mL (1,000 mLs Intravenous New Bag/Given 08/02/22 2241)  Tdap (BOOSTRIX) injection 0.5 mL (0.5 mLs Intramuscular Given 08/02/22 2246)  oxyCODONE-acetaminophen (PERCOCET/ROXICET) 5-325 MG per tablet 1 tablet (1 tablet Oral Given 08/02/22 2241)    FINAL CLINICAL IMPRESSION(S) / ED DIAGNOSES   Final diagnoses:  Fall, initial encounter  Injury of head, subsequent encounter  Hypoxic  Acute cystitis without hematuria     Rx / DC Orders   ED Discharge Orders     None        Note:  This document was prepared using Dragon voice recognition software and may include unintentional dictation errors.   Corena Herter, MD 08/02/22 [phone]

## 2022-08-02 NOTE — H&P (Signed)
History and Physical     Patient: Mark [name]. AVW:[id number] DOB: Apr 28, 1940 DOA: 08/02/2022 DOS: the patient was seen and examined on 08/03/2022 PCP: Barbette Reichmann, MD   Patient coming from: Home  Chief Complaint: Falls   HISTORY OF PRESENT ILLNESS: Garry Dietl. is an 82 y.o. male seen in the emergency room after.  Patient reports sitting in bed and ended up falling to the ground hitting his head and his back. Per report and multiple falls over the past 3 to 4 days and has been weak.  Patient is recently receiving treatment with Bactrim for urinary tract infection and sees urology. Clinically he states that he is not feeling any relief and continues to have urgency frequency and dysuria.  Patient follows up with [facility] and was recently seen on 9 May for follow-up. Patient reported his symptoms of fogginess to the urologist and urinalysis that day showed nitrite positive leukocyte +30 WBCs and 10 RBCs and 30 urinalysis and patient was started on Bactrim. Patient was seen in the emergency room yesterday with complaints of a fall again and hitting his head against a dog crate with no loss of consciousness patient takes Eliquis for his A-fib. Head CT was negative for any intracranial hemorrhage.  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   Past Medical History:  Diagnosis Date   Anxiety    Atrial fibrillation (HCC)    Back pain, chronic    Cancer (HCC)    skin   Colon polyp    Cyst of left kidney    Dementia (HCC)    GERD (gastroesophageal reflux disease)    Heart murmur    Hyperlipemia    Mitral valve prolapse    Neuromuscular disorder (HCC)    Neuropathy    Osteoarthritis    Pulmonary emboli (HCC)    Spinal stenosis    Thyroid nodule    Vertigo    Review  of Systems  Constitutional:  Positive for fatigue.  Genitourinary:  Positive for dysuria, frequency, hematuria and urgency.  Neurological:  Positive for weakness.  All other systems reviewed and are negative.  Allergies  Allergen Reactions   Aricept [Donepezil]     Hallucination, confusion   Ciprofloxacin Other (See Comments)   Erythromycin Swelling   Keflex [Cephalexin] Swelling   Penicillins Swelling    Has patient had a PCN reaction causing immediate rash, facial/tongue/throat swelling, SOB or lightheadedness with hypotension: Yes Has patient had a PCN reaction causing severe rash involving mucus membranes or skin necrosis: No Has patient had a PCN reaction that required hospitalization: Unknown Has patient had a PCN reaction occurring within the last 10 years: No If all of the above answers are "NO", then may proceed with Cephalosporin use.    Past Surgical History:  Procedure Laterality Date   APPENDECTOMY     BACK SURGERY     COLONOSCOPY N/A 08/15/2014   Procedure: COLONOSCOPY;  Surgeon: Scot Jun, MD;  Location: [facility] ENDOSCOPY;  Service: Endoscopy;  Laterality: N/A;   ESOPHAGOGASTRODUODENOSCOPY N/A 08/15/2014   Procedure: ESOPHAGOGASTRODUODENOSCOPY (EGD);  Surgeon: Scot Jun, MD;  Location: [facility] ENDOSCOPY;  Service: Endoscopy;  Laterality: N/A;   TONSILLECTOMY     MEDICATIONS: Prior to Admission medications   Medication Sig Start Date End Date Taking? Authorizing Provider  apixaban (ELIQUIS) 2.5 MG TABS tablet  Take 1 tablet by mouth every 12 (twelve) hours. 06/25/22   [provider]  B Complex Vitamins (VITAMIN B COMPLEX) TABS Take by mouth.    [provider]  betamethasone dipropionate 0.05 % cream Apply topically 2 (two) times daily. 09/25/21   McGowan, Carollee Herter A, PA-C  buPROPion (WELLBUTRIN XL) 150 MG 24 hr tablet Take 1 tablet by mouth daily. 07/02/22   [provider]  Clotrimazole 1 % OINT Apply twice daily x7 days. 11/04/21    Vaillancourt, Lelon Mast, PA-C  Coenzyme Q10 (CO Q 10 PO) Take 1 Dose by mouth daily.     [provider]  Cranberry 200 MG CAPS Take 200 mg by mouth daily.    [provider]  Cyanocobalamin (B-12) 5000 MCG CAPS Take 5,000 mg by mouth daily. Patient taking differently: Take 5,000 mcg by mouth daily. 02/22/18   Ihor Austin, MD  docusate sodium (COLACE) 100 MG capsule Take 100 mg by mouth 3 (three) times daily as needed for mild constipation.    [provider]  esomeprazole (NEXIUM) 40 MG capsule Take 1 capsule by mouth daily. 07/09/22   [provider]  finasteride (PROSCAR) 5 MG tablet Take 1 tablet (5 mg total) by mouth daily. 12/24/21   Vanna Scotland, MD  FLUoxetine (PROZAC) 20 MG capsule TAKE 1 CAPSULE BY MOUTH ONCE DAILY. TO BE COMBINED WITH 40MG . 07/30/22   Jomarie Longs, MD  FLUoxetine (PROZAC) 40 MG capsule TAKE 1 CAPSULE (40 MG) BY MOUTH EVERY DAY - COMBINE WITH 20 MG = 60 MG TOTAL 07/30/22   Jomarie Longs, MD  meclizine (ANTIVERT) 25 MG tablet Take 1 tablet (25 mg total) by mouth 3 (three) times daily as needed for dizziness. 12/01/20   Minna Antis, MD  memantine (NAMENDA) 5 MG tablet 5 mg 2 (two) times daily. 07/09/22   [provider]  niacinamide 100 MG tablet Take 100 mg by mouth in the morning.    [provider]  ondansetron (ZOFRAN-ODT) 4 MG disintegrating tablet Take 1 tablet (4 mg total) by mouth every 8 (eight) hours as needed for nausea or vomiting. 08/01/22   Irean Hong, MD  QUEtiapine (SEROQUEL) 25 MG tablet Take 0.5-1 tablets (12.5-25 mg total) by mouth at bedtime as needed. For sleep, psychosis 07/30/22 09/28/22  Jomarie Longs, MD  simvastatin (ZOCOR) 40 MG tablet Take 40 mg by mouth at bedtime.     [provider]  sucralfate (CARAFATE) 1 g tablet Take by mouth. 01/06/22 01/06/23  [provider]  sulfamethoxazole-trimethoprim (BACTRIM DS) 800-160 MG tablet Take 1 tablet by mouth every 12 (twelve)  hours. 07/30/22   Michiel Cowboy A, PA-C  tamsulosin (FLOMAX) 0.4 MG CAPS capsule Take 1 capsule (0.4 mg total) by mouth daily. 12/24/21   Vanna Scotland, MD  triamcinolone (KENALOG) 0.1 % paste 1 Application 2 (two) times daily. 02/04/22   [provider]    apixaban  2.5 mg Oral Q12H   finasteride  5 mg Oral Daily   FLUoxetine  20 mg Oral Daily   FLUoxetine  40 mg Oral Daily   memantine  5 mg Oral Daily   nystatin   Topical BID   pantoprazole (PROTONIX) IV  40 mg Intravenous Q12H   sodium chloride flush  3 mL Intravenous Q12H   sucralfate  1 g Oral TID WC & HS   tamsulosin  0.4 mg Oral Daily   ED Course: Pt in Ed is alert and awake and oriented. Vitals:   08/02/22  2313 08/02/22 2317 08/02/22 2330 08/03/22 0000  BP: 103/60  122/79 (!) 139/93  Pulse: (S) 84 (S) 92 92 93  Resp: (S) 17     Temp: 98.7 F (37.1 C)     TempSrc: Oral     SpO2: (S) (!) 88% (S) 94% 93% 94%  Weight:       No intake/output data recorded. SpO2: 94 % O2 Flow Rate (L/min): (S) 2 L/min Blood work in ed shows: Glucose of 114, normal lactic, wbc of 10.7 and normal hb and platelet count.  Urinalysis shows trace leucocytes. CT head /CT c-spine show: IMPRESSION: 1. No acute intracranial abnormality. 2. No acute displaced fracture or traumatic listhesis of the cervical spine. 3. Stable grade 2 anterolisthesis of C3 on C4 and grade 1 on C7 on T1. 4. 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  Results for orders placed or performed during the hospital encounter of 08/02/22 (from the past 72 hour(s))  CBC     Status: Abnormal   Collection Time: 08/02/22  6:08 PM  Result Value Ref Range   WBC 10.7 (H) 4.0 - 10.5 K/uL   RBC 4.85 4.22 - 5.81 MIL/uL   Hemoglobin 15.0 13.0 - 17.0 g/dL   HCT 54.0 98.1 - 19.1 %   MCV 92.2 80.0 - 100.0 fL   MCH 30.9 26.0 - 34.0 pg   MCHC 33.6 30.0 - 36.0 g/dL   RDW 47.8 29.5 -  62.1 %   Platelets 225 150 - 400 K/uL   nRBC 0.0 0.0 - 0.2 %    Comment: Performed at [facility], [address], [place], [place] [address]  Comprehensive metabolic panel     Status: Abnormal   Collection Time: 08/02/22  6:08 PM  Result Value Ref Range   Sodium 137 135 - 145 mmol/L   Potassium 4.2 3.5 - 5.1 mmol/L   Chloride 102 98 - 111 mmol/L   CO2 22 22 - 32 mmol/L   Glucose, Bld 114 (H) 70 - 99 mg/dL    Comment: Glucose reference range applies only to samples taken after fasting for at least 8 hours.   BUN 19 8 - 23 mg/dL   Creatinine, Ser 7.84 0.61 - 1.24 mg/dL   Calcium 9.2 8.9 - 69.6 mg/dL   Total Protein 7.6 6.5 - 8.1 g/dL   Albumin 4.1 3.5 - 5.0 g/dL   AST 25 15 - 41 U/L   ALT 16 0 - 44 U/L   Alkaline Phosphatase 66 38 - 126 U/L   Total Bilirubin 0.9 0.3 - 1.2 mg/dL   GFR, Estimated >29 >52 mL/min    Comment: (NOTE) Calculated using the CKD-EPI Creatinine Equation (2021)    Anion gap 13 5 - 15    Comment: Performed at [facility], [address] [place], [place] [address]  Lactic acid, plasma     Status: None   Collection Time: 08/02/22  9:54 PM  Result Value Ref Range   Lactic Acid, Venous 1.1 0.5 - 1.9 mmol/L    Comment: Performed at [facility], [address] [place], [place] [address]  Urinalysis, w/ Reflex to Culture (Infection Suspected) -Urine, Clean Catch     Status: Abnormal   Collection Time: 08/02/22 10:03 PM  Result Value Ref Range   Specimen Source URINE, CLEAN CATCH    Color, Urine YELLOW (A) YELLOW   APPearance CLEAR CLEAR   Specific Gravity,  Urine >1.030 (H) 1.005 - 1.030   pH 5.5 5.0 - 8.0   Glucose, UA NEGATIVE NEGATIVE mg/dL   Hgb urine dipstick NEGATIVE NEGATIVE   Bilirubin Urine SMALL (A) NEGATIVE   Ketones, ur TRACE (A) NEGATIVE mg/dL   Protein, ur NEGATIVE NEGATIVE mg/dL   Nitrite NEGATIVE NEGATIVE   Leukocytes,Ua TRACE (A) NEGATIVE   RBC / HPF 0-5 0 - 5 RBC/hpf   WBC, UA 6-10 0 - 5 WBC/hpf     Comment:        Reflex urine culture not performed if WBC <=10, OR if Squamous epithelial cells >5. If Squamous epithelial cells >5 suggest recollection.    Bacteria, UA NONE SEEN NONE SEEN   Squamous Epithelial / HPF 0-5 0 - 5 /HPF   Mucus PRESENT    Hyaline Casts, UA PRESENT     Comment: Performed at [facility], [address] [place], [place] [address]    Lab Results  Component Value Date   CREATININE 1.12 08/02/2022   CREATININE 0.91 01/16/2022   CREATININE 1.04 12/01/2020      Latest Ref Rng & Units 08/02/2022    6:08 PM 01/16/2022    2:16 PM 12/01/2020   12:42 PM  CMP  Glucose 70 - 99 mg/dL 604  540  981   BUN 8 - 23 mg/dL 19  14  13    Creatinine 0.61 - 1.24 mg/dL 1.91  4.78  2.95   Sodium 135 - 145 mmol/L 137  142  139   Potassium 3.5 - 5.1 mmol/L 4.2  3.6  4.1   Chloride 98 - 111 mmol/L 102  107  104   CO2 22 - 32 mmol/L 22  27  25    Calcium 8.9 - 10.3 mg/dL 9.2  9.0  9.3   Total Protein 6.5 - 8.1 g/dL 7.6     Total Bilirubin 0.3 - 1.2 mg/dL 0.9     Alkaline Phos 38 - 126 U/L 66     AST 15 - 41 U/L 25     ALT 0 - 44 U/L 16      Unresulted Labs (From admission, onward)     Start     Ordered   08/03/22 0500  Comprehensive metabolic panel  Tomorrow morning,   R        08/03/22 0007   08/03/22 0500  CBC  Tomorrow morning,   R        08/03/22 0007   08/03/22 0223  Brain natriuretic peptide  Add-on,   AD        08/03/22 0223   08/03/22 0214  D-dimer, quantitative  ONCE - STAT,   STAT        08/03/22 0214   08/02/22 2154  Blood culture (routine x 2)  BLOOD CULTURE X 2,   STAT      08/02/22 2154   08/02/22 2154  Lactic acid, plasma  Now then every 2 hours,   STAT      08/02/22 2154           Pt has received : Orders Placed This Encounter  Procedures   Blood culture (routine x 2)    Standing Status:   Standing    Number of Occurrences:   2   DG Chest 2 View    Standing Status:   Standing    Number of Occurrences:   1    Order Specific  Question:   Reason for Exam (SYMPTOM  OR DIAGNOSIS  REQUIRED)    Answer:   low o2 sat   CT Head Wo Contrast    Standing Status:   Standing    Number of Occurrences:   1   CT Cervical Spine Wo Contrast    Standing Status:   Standing    Number of Occurrences:   1   DG Lumbar Spine 2-3 Views    Standing Status:   Standing    Number of Occurrences:   1    Order Specific Question:   Reason for Exam (SYMPTOM  OR DIAGNOSIS REQUIRED)    Answer:   back pain post fall   CBC    Standing Status:   Standing    Number of Occurrences:   1   Comprehensive metabolic panel    Standing Status:   Standing    Number of Occurrences:   1   Lactic acid, plasma    Standing Status:   Standing    Number of Occurrences:   2   Urinalysis, w/ Reflex to Culture (Infection Suspected) -Urine, Clean Catch    Standing Status:   Standing    Number of Occurrences:   1    Order Specific Question:   Specimen Source    Answer:   Urine, Clean Catch [76]   Comprehensive metabolic panel    Standing Status:   Standing    Number of Occurrences:   1   CBC    Standing Status:   Standing    Number of Occurrences:   1   D-dimer, quantitative    Standing Status:   Standing    Number of Occurrences:   1   Brain natriuretic peptide    Standing Status:   Standing    Number of Occurrences:   1   Diet Heart Room service appropriate? Yes; Fluid consistency: Thin    Standing Status:   Standing    Number of Occurrences:   1    Order Specific Question:   Room service appropriate?    Answer:   Yes    Order Specific Question:   Fluid consistency:    Answer:   Thin   Orthostatic vital signs    Standing Status:   Standing    Number of Occurrences:   1   Maintain IV access    Standing Status:   Standing    Number of Occurrences:   1   Vital signs    Standing Status:   Standing    Number of Occurrences:   1   Notify physician (specify)    Standing Status:   Standing    Number of Occurrences:   20    Order Specific Question:    Notify Physician    Answer:   for pulse less than 55 or greater than 120    Order Specific Question:   Notify Physician    Answer:   for respiratory rate less than 12 or greater than 25    Order Specific Question:   Notify Physician    Answer:   for temperature greater than 100.5 F    Order Specific Question:   Notify Physician    Answer:   for urinary output less than 30 mL/hr for four hours    Order Specific Question:   Notify Physician    Answer:   for systolic BP less than 90 or greater than 160, diastolic BP less than 60 or greater than 100    Order Specific Question:   Notify Physician  Answer:   for new hypoxia w/ oxygen saturations < 88%   Progressive Mobility Protocol: No Restrictions    Standing Status:   Standing    Number of Occurrences:   1   Daily weights    Standing Status:   Standing    Number of Occurrences:   1   Intake and Output    Standing Status:   Standing    Number of Occurrences:   1   Do not place and if present remove PureWick    Standing Status:   Standing    Number of Occurrences:   1   Initiate Oral Care Protocol    Standing Status:   Standing    Number of Occurrences:   1   Initiate Carrier Fluid Protocol    Standing Status:   Standing    Number of Occurrences:   1   RN may order General Admission PRN Orders utilizing "General Admission PRN medications" (through manage orders) for the following patient needs: allergy symptoms (Claritin), cold sores (Carmex), cough (Robitussin DM), eye irritation (Liquifilm Tears), hemorrhoids (Tucks), indigestion (Maalox), minor skin irritation (Hydrocortisone Cream), muscle pain Romeo Apple Gay), nose irritation (saline nasal spray) and sore throat (Chloraseptic spray).    Standing Status:   Standing    Number of Occurrences:   [id number]   Cardiac Monitoring Continuous x 48 hours Indications for use: Other; Other indications for use: falls    Standing Status:   Standing    Number of Occurrences:   1    Order Specific  Question:   Indications for use:    Answer:   Other    Order Specific Question:   Other indications for use:    Answer:   falls   Full code    Standing Status:   Standing    Number of Occurrences:   1    Order Specific Question:   By:    Answer:   Other   Consult to hospitalist    Standing Status:   Standing    Number of Occurrences:   1    Order Specific Question:   Place call to:    Answer:   [id number]    Order Specific Question:   Reason for Consult    Answer:   Admit   pharmacy consult    Standing Status:   Standing    Number of Occurrences:   1    Order Specific Question:   Reason for Consult (*indicate specifics in comment field)    Answer:   Other (see comment)    Order Specific Question:   Comment:    Answer:   UTI with h/o multiple allergies and resistant uti.   Pulse oximetry check with vital signs    Standing Status:   Standing    Number of Occurrences:   1   Oxygen therapy Mode or (Route): Nasal cannula; Liters Per Minute: 2; Keep 02 saturation: greater than 92 %    Standing Status:   Standing    Number of Occurrences:   20    Order Specific Question:   Mode or (Route)    Answer:   Nasal cannula    Order Specific Question:   Liters Per Minute    Answer:   2    Order Specific Question:   Keep 02 saturation    Answer:   greater than 92 %   EKG 12-Lead    Standing Status:   Standing    Number of Occurrences:  1   Admit to Inpatient (patient's expected length of stay will be greater than 2 midnights or inpatient only procedure)    Standing Status:   Standing    Number of Occurrences:   1    Order Specific Question:   Hospital Area    Answer:   [facility] [[id number]]    Order Specific Question:   Level of Care    Answer:   Med-Surg [16]    Order Specific Question:   Covid Evaluation    Answer:   Asymptomatic - no recent exposure (last 10 days) testing not required    Order Specific Question:   Diagnosis    Answer:   Fall [290176]    Order  Specific Question:   Admitting Physician    Answer:   Darrold Junker    Order Specific Question:   Attending Physician    Answer:   Darrold Junker    Order Specific Question:   Certification:    Answer:   I certify this patient will need inpatient services for at least 2 midnights    Order Specific Question:   Estimated Length of Stay    Answer:   2   Aspiration precautions    Standing Status:   Standing    Number of Occurrences:   1    Meds ordered this encounter  Medications   sodium chloride 0.9 % bolus 1,000 mL   Tdap (BOOSTRIX) injection 0.5 mL   oxyCODONE-acetaminophen (PERCOCET/ROXICET) 5-325 MG per tablet 1 tablet   fosfomycin (MONUROL) packet 3 g   apixaban (ELIQUIS) tablet 2.5 mg   DISCONTD: buPROPion (WELLBUTRIN XL) 24 hr tablet 150 mg   finasteride (PROSCAR) tablet 5 mg   FLUoxetine (PROZAC) capsule 20 mg    TAKE 1 CAPSULE BY MOUTH ONCE DAILY. TO BE COMBINED WITH 40MG .     FLUoxetine (PROZAC) capsule 40 mg    TAKE 1 CAPSULE (40 MG) BY MOUTH EVERY DAY - COMBINE WITH 20 MG = 60 MG TOTAL     memantine (NAMENDA) tablet 5 mg   tamsulosin (FLOMAX) capsule 0.4 mg   sucralfate (CARAFATE) tablet 1 g   sodium chloride flush (NS) 0.9 % injection 3 mL   OR Linked Order Group    acetaminophen (TYLENOL) tablet 650 mg    acetaminophen (TYLENOL) suppository 650 mg   morphine (PF) 2 MG/ML injection 2 mg   lactated ringers infusion   pantoprazole (PROTONIX) injection 40 mg    Admission Imaging : CT Head Wo Contrast  Result Date: 08/02/2022 CLINICAL DATA:  Head trauma, minor (Age >= 65y); Neck trauma (Age >= 65y) EXAM: CT HEAD WITHOUT CONTRAST CT CERVICAL SPINE WITHOUT CONTRAST TECHNIQUE: Multidetector CT imaging of the head and cervical spine was performed following the standard protocol without intravenous contrast. Multiplanar CT image reconstructions of the cervical spine were also generated. RADIATION DOSE REDUCTION: This exam was performed according to the  departmental dose-optimization program which includes automated exposure control, adjustment of the mA and/or kV according to patient size and/or use of iterative reconstruction technique. COMPARISON:  CT C-spine 08/01/2022, CT head 08/01/2022 FINDINGS: CT HEAD FINDINGS Brain: Cerebral ventricle sizes are concordant with the degree of cerebral volume loss. Patchy and confluent areas of decreased attenuation are noted throughout the deep and periventricular white matter of the cerebral hemispheres bilaterally, compatible with chronic microvascular ischemic disease. No evidence of large-territorial acute infarction. No parenchymal hemorrhage. No mass lesion. No extra-axial collection. No mass effect  or midline shift. No hydrocephalus. Basilar cisterns are patent. Vascular: No hyperdense vessel. Skull: No acute fracture or focal lesion. Temporomandibular joint degenerative changes. Sinuses/Orbits: Paranasal sinuses and mastoid air cells are clear. The orbits are unremarkable. Other: None. CT CERVICAL SPINE FINDINGS Alignment: Stable grade 2 anterolisthesis of C3 on C4. Stable grade 1 anterolisthesis of C7 on T1. Skull base and vertebrae: Multilevel moderate to severe degenerative changes of the spine with associated severe right C6-C7 osseous neural foraminal stenosis. No severe osseous central canal stenosis. No acute fracture. No aggressive appearing focal osseous lesion or focal pathologic process. Soft tissues and spinal canal: No prevertebral fluid or swelling. No visible canal hematoma. Upper chest: Unremarkable. Other: None. IMPRESSION: 1. No acute intracranial abnormality. 2. No acute displaced fracture or traumatic listhesis of the cervical spine. 3. Stable grade 2 anterolisthesis of C3 on C4 and grade 1 on C7 on T1. 4. 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 Electronically Signed   By: Tish Frederickson M.D.   On:  08/02/2022 19:08   CT Cervical Spine Wo Contrast  Result Date: 08/02/2022 CLINICAL DATA:  Head trauma, minor (Age >= 65y); Neck trauma (Age >= 65y) EXAM: CT HEAD WITHOUT CONTRAST CT CERVICAL SPINE WITHOUT CONTRAST TECHNIQUE: Multidetector CT imaging of the head and cervical spine was performed following the standard protocol without intravenous contrast. Multiplanar CT image reconstructions of the cervical spine were also generated. RADIATION DOSE REDUCTION: This exam was performed according to the departmental dose-optimization program which includes automated exposure control, adjustment of the mA and/or kV according to patient size and/or use of iterative reconstruction technique. COMPARISON:  CT C-spine 08/01/2022, CT head 08/01/2022 FINDINGS: CT HEAD FINDINGS Brain: Cerebral ventricle sizes are concordant with the degree of cerebral volume loss. Patchy and confluent areas of decreased attenuation are noted throughout the deep and periventricular white matter of the cerebral hemispheres bilaterally, compatible with chronic microvascular ischemic disease. No evidence of large-territorial acute infarction. No parenchymal hemorrhage. No mass lesion. No extra-axial collection. No mass effect or midline shift. No hydrocephalus. Basilar cisterns are patent. Vascular: No hyperdense vessel. Skull: No acute fracture or focal lesion. Temporomandibular joint degenerative changes. Sinuses/Orbits: Paranasal sinuses and mastoid air cells are clear. The orbits are unremarkable. Other: None. CT CERVICAL SPINE FINDINGS Alignment: Stable grade 2 anterolisthesis of C3 on C4. Stable grade 1 anterolisthesis of C7 on T1. Skull base and vertebrae: Multilevel moderate to severe degenerative changes of the spine with associated severe right C6-C7 osseous neural foraminal stenosis. No severe osseous central canal stenosis. No acute fracture. No aggressive appearing focal osseous lesion or focal pathologic process. Soft tissues and  spinal canal: No prevertebral fluid or swelling. No visible canal hematoma. Upper chest: Unremarkable. Other: None. IMPRESSION: 1. No acute intracranial abnormality. 2. No acute displaced fracture or traumatic listhesis of the cervical spine. 3. Stable grade 2 anterolisthesis of C3 on C4 and grade 1 on C7 on T1. 4. 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 Electronically Signed   By: Tish Frederickson M.D.   On: 08/02/2022 19:08   DG Lumbar Spine 2-3 Views  Result Date: 08/02/2022 CLINICAL DATA:  Back pain after fall EXAM: LUMBAR SPINE - 2-3 VIEW COMPARISON:  Lumbar spine x-ray 02/25/2016. CT chest abdomen and pelvis 01/20/2022. FINDINGS: There is mild compression deformity of the superior endplate of L2 which is new from prior. This is  age indeterminate. Vertebral body heights are otherwise well maintained. Alignment is anatomic. There is mild disc space narrowing throughout the lumbar spine compatible with degenerative change similar to prior. There are degenerative changes of lower lumbar facet joints. Soft tissues are within normal limits. IMPRESSION: Mild compression deformity of the superior endplate of L2 which is new from prior. This is age indeterminate. Correlate with point tenderness. Electronically Signed   By: Darliss Cheney M.D.   On: 08/02/2022 18:58   DG Chest 2 View  Result Date: 08/02/2022 CLINICAL DATA:  Low O2 sat EXAM: CHEST - 2 VIEW COMPARISON:  Chest x-ray 01/16/2022 FINDINGS: The heart size and mediastinal contours are within normal limits. Both lungs are clear. The visualized skeletal structures are unremarkable. IMPRESSION: No active cardiopulmonary disease. Electronically Signed   By: Darliss Cheney M.D.   On: 08/02/2022 18:56   CT Cervical Spine Wo Contrast  Result Date: 08/01/2022 CLINICAL DATA:  Status post fall. EXAM: CT CERVICAL SPINE WITHOUT CONTRAST TECHNIQUE: Multidetector CT imaging  of the cervical spine was performed without intravenous contrast. Multiplanar CT image reconstructions were also generated. RADIATION DOSE REDUCTION: This exam was performed according to the departmental dose-optimization program which includes automated exposure control, adjustment of the mA and/or kV according to patient size and/or use of iterative reconstruction technique. COMPARISON:  January 16, 2022 FINDINGS: Alignment: There is stable, chronic, approximately 4 mm anterolisthesis of the C3 vertebral body on C4. Stable, chronic anterolisthesis of C7 on T1 is also noted. Skull base and vertebrae: No acute fracture. No primary bone lesion or focal pathologic process. Degenerative changes are seen involving the tip of the dens and the adjacent portion of the anterior arch of C1. Soft tissues and spinal canal: No prevertebral fluid or swelling. No visible canal hematoma. Disc levels: Marked severity endplate sclerosis, anterior osteophyte formation and posterior bony spurring are seen at the levels of C4-C5, C5-C6, C6-C7 and C7-T1. There is marked severity narrowing of the anterior atlantoaxial articulation. Marked severity intervertebral disc space narrowing is seen at C4-C5, C5-C6, C6-C7 and C7-T1. Bilateral marked severity multilevel facet joint hypertrophy is noted. Upper chest: Emphysematous lung disease is seen within the bilateral upper lobes. Other: The left lobe of the thyroid gland is enlarged, lobulated and heterogeneous in appearance. IMPRESSION: 1. No acute fracture or traumatic subluxation of the cervical spine. 2. Stable, chronic, 4 mm anterolisthesis of the C3 vertebral body on C4. 3. Stable, chronic anterolisthesis of C7 on T1. 4. Marked severity multilevel degenerative changes, as described above. 5. Emphysematous lung disease. 6. Enlarged, lobulated and heterogeneous left lobe of the thyroid gland. Correlation with nonemergent thyroid ultrasound is recommended. Emphysema (ICD10-J43.9).  Electronically Signed   By: Aram Candela M.D.   On: 08/01/2022 20:02   CT Head Wo Contrast  Result Date: 08/01/2022 CLINICAL DATA:  Head trauma EXAM: CT HEAD WITHOUT CONTRAST TECHNIQUE: Contiguous axial images were obtained from the base of the skull through the vertex without intravenous contrast. RADIATION DOSE REDUCTION: This exam was performed according to the departmental dose-optimization program which includes automated exposure control, adjustment of the mA and/or kV according to patient size and/or use of iterative reconstruction technique. COMPARISON:  Head CT 01/16/2022 FINDINGS: Brain: No evidence of acute infarction, hemorrhage, hydrocephalus, extra-axial collection or mass lesion/mass effect. There is stable moderate patchy periventricular and deep white matter hypodensity. There is stable moderate diffuse atrophy. Vascular: Atherosclerotic calcifications are present within the cavernous internal carotid arteries. Skull: Normal. Negative for fracture or focal lesion. Sinuses/Orbits:  No acute finding. Other: None. IMPRESSION: 1. No acute intracranial abnormality. 2. 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 Electronically Signed   By: Darliss Cheney M.D.   On: 08/01/2022 19:53   Physical Examination: Vitals:   08/02/22 2313 08/02/22 2317 08/02/22 2330 08/03/22 0000  BP: 103/60  122/79 (!) 139/93  Pulse: (S) 84 (S) 92 92 93  Temp: 98.7 F (37.1 C)     Resp: (S) 17     Weight:      SpO2: (S) (!) 88% (S) 94% 93% 94%  TempSrc: Oral     BMI (Calculated):       Physical Exam Vitals and nursing note reviewed.  Constitutional:      General: He is not in acute distress.    Appearance: Normal appearance. He is not ill-appearing, toxic-appearing or diaphoretic.     Interventions: Nasal cannula in place.  HENT:     Head: Normocephalic and atraumatic.     Right Ear: Hearing and external ear normal.     Left Ear: Hearing and external ear normal.     Nose: Nose  normal. No nasal deformity.     Mouth/Throat:     Lips: Pink.     Mouth: Mucous membranes are moist.     Tongue: No lesions.     Pharynx: Oropharynx is clear.  Eyes:     Extraocular Movements: Extraocular movements intact.  Cardiovascular:     Rate and Rhythm: Normal rate and regular rhythm.     Pulses: Normal pulses.     Heart sounds: Normal heart sounds.  Pulmonary:     Effort: Pulmonary effort is normal.     Breath sounds: Normal breath sounds.  Abdominal:     General: Bowel sounds are normal. There is no distension.     Palpations: Abdomen is soft. There is no mass.     Tenderness: There is no abdominal tenderness. There is no guarding.     Hernia: No hernia is present.  Musculoskeletal:     Right lower leg: No edema.     Left lower leg: No edema.  Skin:    General: Skin is warm.  Neurological:     General: No focal deficit present.     Mental Status: He is alert and oriented to person, place, and time.     Cranial Nerves: Cranial nerves 2-12 are intact.     Motor: Motor function is intact.  Psychiatric:        Attention and Perception: Attention normal.        Mood and Affect: Mood normal.        Speech: Speech normal.        Behavior: Behavior normal. Behavior is cooperative.        Cognition and Memory: Cognition normal.     Assessment and Plan: * Fall Suspect 2/2 orthostatic BP. We will obtain orthostatic vitals. Fall precaution. PT consult prior to discharge. 2:!5 am Orthostatic bp positive for orthostatic changes: Supine 146/83  / 85 Sitting 117/71  / 101 Standing 103/60 / 84 and 88% on RA. Pt orthostatic. We will monitor. Make medication changes.     UTI (urinary tract infection) Pt given fosfomycin in ed x single dose. Urinalysis    Component Value Date/Time   COLORURINE YELLOW (A) 08/02/2022 2203   APPEARANCEUR CLEAR 08/02/2022 2203   APPEARANCEUR Hazy (A) 07/30/2022 1556   LABSPEC >1.030 (H) 08/02/2022 2203   PHURINE 5.5 08/02/2022 2203    GLUCOSEU NEGATIVE 08/02/2022  2203   HGBUR NEGATIVE 08/02/2022 2203   BILIRUBINUR SMALL (A) 08/02/2022 2203   BILIRUBINUR Negative 07/30/2022 1556   KETONESUR TRACE (A) 08/02/2022 2203   PROTEINUR NEGATIVE 08/02/2022 2203   NITRITE NEGATIVE 08/02/2022 2203   LEUKOCYTESUR TRACE (A) 08/02/2022 2203  Pt given fosfomycin. Recent Results (from the past 240 hour(s))  CULTURE, URINE COMPREHENSIVE     Status: None (Preliminary result)   Collection Time: 07/30/22  3:56 PM   Specimen: Urine   UR  Result Value Ref Range Status   Urine Culture, Comprehensive Preliminary report  Preliminary   Organism ID, Bacteria Comment  Preliminary    Comment: Microbiological testing to rule out the presence of possible pathogens is in progress. Greater than 100,000 colony forming units per mL   Microscopic Examination     Status: Abnormal   Collection Time: 07/30/22  3:56 PM   Urine  Result Value Ref Range Status   WBC, UA 11-30 (A) 0 - 5 /hpf Final   RBC, Urine 3-10 (A) 0 - 2 /hpf Final   Epithelial Cells (non renal) 0-10 0 - 10 /hpf Final   Casts Present (A) None seen /lpf Final   Cast Type Hyaline casts N/A Final   Mucus, UA Present (A) Not Estab. Final   Bacteria, UA Many (A) None seen/Few Final  Urine culture from 5/9 still pending.   Acute respiratory failure with hypoxia (HCC) Vitals:   08/02/22 2305 08/02/22 2310 08/02/22 2313 08/02/22 2317  BP:  (S) 117/71 103/60   Pulse: (S) 85 (!) 101 (S) 84 (S) 92  Temp:   98.7 F (37.1 C)   Resp:   (S) 17   Weight:      SpO2: (!) 87% (S) 92% (S) (!) 88% (S) 94%  TempSrc:   Oral   BMI (Calculated):      SpO2: (S) 94 % O2 Flow Rate (L/min): (S) 2 L/min Pt is on eliquis.  Chest xray negative. 10/2017 2 d echo " - Procedure narrative: Transthoracic echocardiography. Image    quality was poor. The study was technically difficult, as a    result of poor acoustic windows and poor sound wave transmission.    Intravenous contrast (Definity) was  administered.  - Left ventricle: The cavity size was normal. Systolic function was    normal. The estimated ejection fraction was in the range of 60%    to 65%.  Cont with supplemental oxygen and we will get noncontrast chest ct  or V/Q scan as  needed and based on dimer. Pulmonary consult as needed.     Pulmonary embolus (HCC) Continue eliquis.    PAF (paroxysmal atrial fibrillation) (HCC) SR On EKG. Not scanned in chart.  Continue eliquis.  Chads score of > 4.    GERD (gastroesophageal reflux disease) IV PPI.  Aspiration precaution.   Chronic anticoagulation Pt is on eliquis for his PAF.    Anxiety Cont Prozac and Wellbutrin XL. Marland Kitchen  Orthostatic hypotension Pending.   DVT prophylaxis:  Eliquis.  Code Status:  Full code.      08/01/2022    7:03 PM  Advanced Directives  Does Patient Have a Medical Advance Directive? No  Family Communication:  Wife at bedside. Emergency Contact: Contact Information     Name Relation Home Work Mobile   Wyler, [place] Spouse [phone]  [phone]   Gabriela, [place]   [phone]   Gevorg, UtreraFredderick Severance   [phone]     Disposition Plan:  Home.  Consults: None.  Admission status: Observation.  Unit / Expected LOS: Med tele/ 2 days.   Gertha Calkin MD [facility]  6 PM- 2 AM. [phone]( Pager )  For questions regarding this patient please use [email or web address] to contact the current [facility] MD.   Bonita Quin may also call [phone] to contact current Assigned [facility] Attending/Consulting MD for this patient.

## 2022-08-02 NOTE — ED Provider Triage Note (Signed)
Emergency Medicine Provider Triage Evaluation Note  Alexsis Maurer. , a 82 y.o. male  was evaluated in triage.  Pt complains of low back pain and frequent falls. He was discharged from here yesterday. He sustained another fall today. He hit the left side of his head. No loss of consciousness. He started bactrim 3 days ago for UTI. Back pain is across lower back.   Physical Exam  BP (!) 114/93 (BP Location: Left Arm)   Pulse 96   Temp 98.9 F (37.2 C) (Oral)   Resp 17   Wt 110 kg   SpO2 90%   BMI 29.52 kg/m  Gen:   Awake, no distress   Resp:  Normal effort  MSK:   Moves extremities without difficulty  Other:    Medical Decision Making  Medically screening exam initiated at 6:03 PM.  Appropriate orders placed.  [name] was informed that the remainder of the evaluation will be completed by another provider, this initial triage assessment does not replace that evaluation, and the importance of remaining in the ED until their evaluation is complete.    Chinita Pester, FNP 08/02/22 1809

## 2022-08-02 NOTE — Assessment & Plan Note (Signed)
Vitals:   08/02/22 2305 08/02/22 2310 08/02/22 2313 08/02/22 2317  BP:  (S) 117/71 103/60   Pulse: (S) 85 (!) 101 (S) 84 (S) 92  Temp:   98.7 F (37.1 C)   Resp:   (S) 17   Weight:      SpO2: (!) 87% (S) 92% (S) (!) 88% (S) 94%  TempSrc:   Oral   BMI (Calculated):      SpO2: (S) 94 % O2 Flow Rate (L/min): (S) 2 L/min Pt is on eliquis.  Chest xray negative. 10/2017 2 d echo " - Procedure narrative: Transthoracic echocardiography. Image    quality was poor. The study was technically difficult, as a    result of poor acoustic windows and poor sound wave transmission.    Intravenous contrast (Definity) was administered.  - Left ventricle: The cavity size was normal. Systolic function was    normal. The estimated ejection fraction was in the range of 60%    to 65%.  Cont with supplemental oxygen and we will get noncontrast chest ct  or V/Q scan as deemed needed and dimer. Pulmonary consult as needed.

## 2022-08-02 NOTE — Assessment & Plan Note (Signed)
SR On EKG. Not scanned in chart.  Continue eliquis.  Chads score of > 4.

## 2022-08-02 NOTE — Assessment & Plan Note (Signed)
Cont Prozac and Wellbutrin XL. Mark [name]

## 2022-08-02 NOTE — H&P (Incomplete)
History and Physical     Patient: Mark [name]. GNF:[id number] DOB: September 12, 1940 DOA: 08/02/2022 DOS: the patient was seen and examined on 08/03/2022 PCP: Barbette Reichmann, MD   Patient coming from: Home  Chief Complaint: Falls   HISTORY OF PRESENT ILLNESS: Mark [name]. is an 82 y.o. male seen in the emergency room after.  Patient reports sitting in bed and ended up falling to the ground hitting his head and his back. Per report and multiple falls over the past 3 to 4 days and has been weak.  Patient is recently receiving treatment with Bactrim for urinary tract infection and sees urology. Clinically he states that he is not feeling any relief and continues to have urgency frequency and dysuria.  Patient follows up with [facility] and was recently seen on 9 May for follow-up. Patient reported his symptoms of fogginess to the urologist and urinalysis that day showed nitrite positive leukocyte +30 WBCs and 10 RBCs and 30 urinalysis and patient was started on Bactrim. Patient was seen in the emergency room yesterday with complaints of a fall again and hitting his head against a dog crate with no loss of consciousness patient takes Eliquis for his A-fib. Head CT was negative for any intracranial hemorrhage.  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   Past Medical History:  Diagnosis Date  . Anxiety   . Atrial fibrillation (HCC)   . Back pain, chronic   . Cancer (HCC)    skin  . Colon polyp   . Cyst of left kidney   . Dementia (HCC)   . GERD (gastroesophageal reflux disease)   . Heart murmur   . Hyperlipemia   . Mitral valve prolapse   . Neuromuscular disorder (HCC)   . Neuropathy   . Osteoarthritis   . Pulmonary emboli (HCC)   . Spinal stenosis   . Thyroid nodule   .  Vertigo    Review of Systems  Constitutional:  Positive for fatigue.  Genitourinary:  Positive for dysuria, frequency, hematuria and urgency.  Neurological:  Positive for weakness.  All other systems reviewed and are negative.  Allergies  Allergen Reactions  . Aricept [Donepezil]     Hallucination, confusion  . Ciprofloxacin Other (See Comments)  . Erythromycin Swelling  . Keflex [Cephalexin] Swelling  . Penicillins Swelling    Has patient had a PCN reaction causing immediate rash, facial/tongue/throat swelling, SOB or lightheadedness with hypotension: Yes Has patient had a PCN reaction causing severe rash involving mucus membranes or skin necrosis: No Has patient had a PCN reaction that required hospitalization: Unknown Has patient had a PCN reaction occurring within the last 10 years: No If all of the above answers are "NO", then may proceed with Cephalosporin use.    Past Surgical History:  Procedure Laterality Date  . APPENDECTOMY    . BACK SURGERY    . COLONOSCOPY N/A 08/15/2014   Procedure: COLONOSCOPY;  Surgeon: Scot Jun, MD;  Location: [facility] ENDOSCOPY;  Service: Endoscopy;  Laterality: N/A;  . ESOPHAGOGASTRODUODENOSCOPY N/A 08/15/2014   Procedure: ESOPHAGOGASTRODUODENOSCOPY (EGD);  Surgeon: Scot Jun, MD;  Location: [facility] ENDOSCOPY;  Service: Endoscopy;  Laterality: N/A;  . TONSILLECTOMY     MEDICATIONS: Prior to Admission medications   Medication Sig Start Date End Date Taking? Authorizing Provider  apixaban (ELIQUIS) 2.5 MG TABS tablet  Take 1 tablet by mouth every 12 (twelve) hours. 06/25/22   [provider]  B Complex Vitamins (VITAMIN B COMPLEX) TABS Take by mouth.    [provider]  betamethasone dipropionate 0.05 % cream Apply topically 2 (two) times daily. 09/25/21   McGowan, Carollee Herter A, PA-C  buPROPion (WELLBUTRIN XL) 150 MG 24 hr tablet Take 1 tablet by mouth daily. 07/02/22   [provider]  Clotrimazole 1 % OINT Apply twice  daily x7 days. 11/04/21   Vaillancourt, Lelon Mast, PA-C  Coenzyme Q10 (CO Q 10 PO) Take 1 Dose by mouth daily.     [provider]  Cranberry 200 MG CAPS Take 200 mg by mouth daily.    [provider]  Cyanocobalamin (B-12) 5000 MCG CAPS Take 5,000 mg by mouth daily. Patient taking differently: Take 5,000 mcg by mouth daily. 02/22/18   Ihor Austin, MD  docusate sodium (COLACE) 100 MG capsule Take 100 mg by mouth 3 (three) times daily as needed for mild constipation.    [provider]  esomeprazole (NEXIUM) 40 MG capsule Take 1 capsule by mouth daily. 07/09/22   [provider]  finasteride (PROSCAR) 5 MG tablet Take 1 tablet (5 mg total) by mouth daily. 12/24/21   Vanna Scotland, MD  FLUoxetine (PROZAC) 20 MG capsule TAKE 1 CAPSULE BY MOUTH ONCE DAILY. TO BE COMBINED WITH 40MG . 07/30/22   Jomarie Longs, MD  FLUoxetine (PROZAC) 40 MG capsule TAKE 1 CAPSULE (40 MG) BY MOUTH EVERY DAY - COMBINE WITH 20 MG = 60 MG TOTAL 07/30/22   Jomarie Longs, MD  meclizine (ANTIVERT) 25 MG tablet Take 1 tablet (25 mg total) by mouth 3 (three) times daily as needed for dizziness. 12/01/20   Minna Antis, MD  memantine (NAMENDA) 5 MG tablet 5 mg 2 (two) times daily. 07/09/22   [provider]  niacinamide 100 MG tablet Take 100 mg by mouth in the morning.    [provider]  ondansetron (ZOFRAN-ODT) 4 MG disintegrating tablet Take 1 tablet (4 mg total) by mouth every 8 (eight) hours as needed for nausea or vomiting. 08/01/22   Irean Hong, MD  QUEtiapine (SEROQUEL) 25 MG tablet Take 0.5-1 tablets (12.5-25 mg total) by mouth at bedtime as needed. For sleep, psychosis 07/30/22 09/28/22  Jomarie Longs, MD  simvastatin (ZOCOR) 40 MG tablet Take 40 mg by mouth at bedtime.     [provider]  sucralfate (CARAFATE) 1 g tablet Take by mouth. 01/06/22 01/06/23  [provider]  sulfamethoxazole-trimethoprim (BACTRIM DS) 800-160 MG tablet Take 1 tablet by  mouth every 12 (twelve) hours. 07/30/22   Michiel Cowboy A, PA-C  tamsulosin (FLOMAX) 0.4 MG CAPS capsule Take 1 capsule (0.4 mg total) by mouth daily. 12/24/21   Vanna Scotland, MD  triamcinolone (KENALOG) 0.1 % paste 1 Application 2 (two) times daily. 02/04/22   [provider]   . nystatin   Topical BID   ED Course: Pt in Ed is alert and awake and oriented. Vitals:   08/02/22 2305 08/02/22 2310 08/02/22 2313 08/02/22 2317  BP:  (S) 117/71 103/60   Pulse: (S) 85 (!) 101 (S) 84 (S) 92  Resp:   (S) 17   Temp:   98.7 F (37.1 C)   TempSrc:   Oral   SpO2: (!) 87% (S) 92% (S) (!) 88% (S) 94%  Weight:       No intake/output data recorded. SpO2: (S) 94 % O2 Flow Rate (L/min): (S) 2  L/min Blood work in ed shows: Glucose of 114, normal lactic, wbc of 10.7 and normal hb and platelet count.  Urinalysis shows trace leucocytes. CT head /CT c-spine show: IMPRESSION: 1. No acute intracranial abnormality. 2. No acute displaced fracture or traumatic listhesis of the cervical spine. 3. Stable grade 2 anterolisthesis of C3 on C4 and grade 1 on C7 on T1. 4. 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  Results for orders placed or performed during the hospital encounter of 08/02/22 (from the past 72 hour(s))  CBC     Status: Abnormal   Collection Time: 08/02/22  6:08 PM  Result Value Ref Range   WBC 10.7 (H) 4.0 - 10.5 K/uL   RBC 4.85 4.22 - 5.81 MIL/uL   Hemoglobin 15.0 13.0 - 17.0 g/dL   HCT 16.1 09.6 - 04.5 %   MCV 92.2 80.0 - 100.0 fL   MCH 30.9 26.0 - 34.0 pg   MCHC 33.6 30.0 - 36.0 g/dL   RDW 40.9 81.1 - 91.4 %   Platelets 225 150 - 400 K/uL   nRBC 0.0 0.0 - 0.2 %    Comment: Performed at [facility], [address], [place], [place] [address]  Comprehensive metabolic panel     Status: Abnormal   Collection Time: 08/02/22  6:08 PM  Result Value Ref Range   Sodium 137  135 - 145 mmol/L   Potassium 4.2 3.5 - 5.1 mmol/L   Chloride 102 98 - 111 mmol/L   CO2 22 22 - 32 mmol/L   Glucose, Bld 114 (H) 70 - 99 mg/dL    Comment: Glucose reference range applies only to samples taken after fasting for at least 8 hours.   BUN 19 8 - 23 mg/dL   Creatinine, Ser 6.21 0.61 - 1.24 mg/dL   Calcium 9.2 8.9 - 30.8 mg/dL   Total Protein 7.6 6.5 - 8.1 g/dL   Albumin 4.1 3.5 - 5.0 g/dL   AST 25 15 - 41 U/L   ALT 16 0 - 44 U/L   Alkaline Phosphatase 66 38 - 126 U/L   Total Bilirubin 0.9 0.3 - 1.2 mg/dL   GFR, Estimated >65 >78 mL/min    Comment: (NOTE) Calculated using the CKD-EPI Creatinine Equation (2021)    Anion gap 13 5 - 15    Comment: Performed at [facility], [address] [place], [place] [address]  Lactic acid, plasma     Status: None   Collection Time: 08/02/22  9:54 PM  Result Value Ref Range   Lactic Acid, Venous 1.1 0.5 - 1.9 mmol/L    Comment: Performed at [facility], [address] [place], [place] [address]  Urinalysis, w/ Reflex to Culture (Infection Suspected) -Urine, Clean Catch     Status: Abnormal   Collection Time: 08/02/22 10:03 PM  Result Value Ref Range   Specimen Source URINE, CLEAN CATCH    Color, Urine YELLOW (A) YELLOW   APPearance CLEAR CLEAR   Specific Gravity, Urine >1.030 (H) 1.005 - 1.030   pH 5.5 5.0 - 8.0   Glucose, UA NEGATIVE NEGATIVE mg/dL   Hgb urine dipstick NEGATIVE NEGATIVE   Bilirubin Urine SMALL (A) NEGATIVE   Ketones, ur TRACE (A) NEGATIVE mg/dL   Protein, ur NEGATIVE NEGATIVE mg/dL   Nitrite NEGATIVE NEGATIVE   Leukocytes,Ua TRACE (A) NEGATIVE   RBC / HPF 0-5 0 - 5 RBC/hpf   WBC, UA  6-10 0 - 5 WBC/hpf    Comment:        Reflex urine culture not performed if WBC <=10, OR if Squamous epithelial cells >5. If Squamous epithelial cells >5 suggest recollection.    Bacteria, UA NONE SEEN NONE SEEN   Squamous Epithelial / HPF 0-5 0 - 5 /HPF   Mucus PRESENT    Hyaline Casts, UA PRESENT      Comment: Performed at [facility], [address] [place], [place] [address]    Lab Results  Component Value Date   CREATININE 1.12 08/02/2022   CREATININE 0.91 01/16/2022   CREATININE 1.04 12/01/2020      Latest Ref Rng & Units 08/02/2022    6:08 PM 01/16/2022    2:16 PM 12/01/2020   12:42 PM  CMP  Glucose 70 - 99 mg/dL 696  295  284   BUN 8 - 23 mg/dL 19  14  13    Creatinine 0.61 - 1.24 mg/dL 1.32  4.40  1.02   Sodium 135 - 145 mmol/L 137  142  139   Potassium 3.5 - 5.1 mmol/L 4.2  3.6  4.1   Chloride 98 - 111 mmol/L 102  107  104   CO2 22 - 32 mmol/L 22  27  25    Calcium 8.9 - 10.3 mg/dL 9.2  9.0  9.3   Total Protein 6.5 - 8.1 g/dL 7.6     Total Bilirubin 0.3 - 1.2 mg/dL 0.9     Alkaline Phos 38 - 126 U/L 66     AST 15 - 41 U/L 25     ALT 0 - 44 U/L 16      Unresulted Labs (From admission, onward)     Start     Ordered   08/02/22 2154  Blood culture (routine x 2)  BLOOD CULTURE X 2,   STAT      08/02/22 2154   08/02/22 2154  Lactic acid, plasma  Now then every 2 hours,   STAT      08/02/22 2154           Pt has received : Orders Placed This Encounter  Procedures  . Blood culture (routine x 2)    Standing Status:   Standing    Number of Occurrences:   2  . DG Chest 2 View    Standing Status:   Standing    Number of Occurrences:   1    Order Specific Question:   Reason for Exam (SYMPTOM  OR DIAGNOSIS REQUIRED)    Answer:   low o2 sat  . CT Head Wo Contrast    Standing Status:   Standing    Number of Occurrences:   1  . CT Cervical Spine Wo Contrast    Standing Status:   Standing    Number of Occurrences:   1  . DG Lumbar Spine 2-3 Views    Standing Status:   Standing    Number of Occurrences:   1    Order Specific Question:   Reason for Exam (SYMPTOM  OR DIAGNOSIS REQUIRED)    Answer:   back pain post fall  . CBC    Standing Status:   Standing    Number of Occurrences:   1  . Comprehensive metabolic panel    Standing Status:   Standing     Number of Occurrences:   1  . Lactic acid, plasma    Standing Status:   Standing  Number of Occurrences:   2  . Urinalysis, w/ Reflex to Culture (Infection Suspected) -Urine, Clean Catch    Standing Status:   Standing    Number of Occurrences:   1    Order Specific Question:   Specimen Source    Answer:   Urine, Clean Catch [[id number]]  . Orthostatic vital signs    Standing Status:   Standing    Number of Occurrences:   1  . Consult to hospitalist    Standing Status:   Standing    Number of Occurrences:   1    Order Specific Question:   Place call to:    Answer:   [id number]    Order Specific Question:   Reason for Consult    Answer:   Admit  . pharmacy consult    Standing Status:   Standing    Number of Occurrences:   1    Order Specific Question:   Reason for Consult (*indicate specifics in comment field)    Answer:   Other (see comment)    Order Specific Question:   Comment:    Answer:   UTI with h/o multiple allergies and resistant uti.    Meds ordered this encounter  Medications  . sodium chloride 0.9 % bolus 1,000 mL  . Tdap (BOOSTRIX) injection 0.5 mL  . oxyCODONE-acetaminophen (PERCOCET/ROXICET) 5-325 MG per tablet 1 tablet  . fosfomycin (MONUROL) packet 3 g    Admission Imaging : CT Head Wo Contrast  Result Date: 08/02/2022 CLINICAL DATA:  Head trauma, minor (Age >= 65y); Neck trauma (Age >= 65y) EXAM: CT HEAD WITHOUT CONTRAST CT CERVICAL SPINE WITHOUT CONTRAST TECHNIQUE: Multidetector CT imaging of the head and cervical spine was performed following the standard protocol without intravenous contrast. Multiplanar CT image reconstructions of the cervical spine were also generated. RADIATION DOSE REDUCTION: This exam was performed according to the departmental dose-optimization program which includes automated exposure control, adjustment of the mA and/or kV according to patient size and/or use of iterative reconstruction technique. COMPARISON:  CT C-spine 08/01/2022, CT head  08/01/2022 FINDINGS: CT HEAD FINDINGS Brain: Cerebral ventricle sizes are concordant with the degree of cerebral volume loss. Patchy and confluent areas of decreased attenuation are noted throughout the deep and periventricular white matter of the cerebral hemispheres bilaterally, compatible with chronic microvascular ischemic disease. No evidence of large-territorial acute infarction. No parenchymal hemorrhage. No mass lesion. No extra-axial collection. No mass effect or midline shift. No hydrocephalus. Basilar cisterns are patent. Vascular: No hyperdense vessel. Skull: No acute fracture or focal lesion. Temporomandibular joint degenerative changes. Sinuses/Orbits: Paranasal sinuses and mastoid air cells are clear. The orbits are unremarkable. Other: None. CT CERVICAL SPINE FINDINGS Alignment: Stable grade 2 anterolisthesis of C3 on C4. Stable grade 1 anterolisthesis of C7 on T1. Skull base and vertebrae: Multilevel moderate to severe degenerative changes of the spine with associated severe right C6-C7 osseous neural foraminal stenosis. No severe osseous central canal stenosis. No acute fracture. No aggressive appearing focal osseous lesion or focal pathologic process. Soft tissues and spinal canal: No prevertebral fluid or swelling. No visible canal hematoma. Upper chest: Unremarkable. Other: None. IMPRESSION: 1. No acute intracranial abnormality. 2. No acute displaced fracture or traumatic listhesis of the cervical spine. 3. Stable grade 2 anterolisthesis of C3 on C4 and grade 1 on C7 on T1. 4. 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 Electronically Signed   By: Tish Frederickson  M.D.   On: 08/02/2022 19:08   CT Cervical Spine Wo Contrast  Result Date: 08/02/2022 CLINICAL DATA:  Head trauma, minor (Age >= 65y); Neck trauma (Age >= 65y) EXAM: CT HEAD WITHOUT CONTRAST CT CERVICAL SPINE WITHOUT CONTRAST TECHNIQUE:  Multidetector CT imaging of the head and cervical spine was performed following the standard protocol without intravenous contrast. Multiplanar CT image reconstructions of the cervical spine were also generated. RADIATION DOSE REDUCTION: This exam was performed according to the departmental dose-optimization program which includes automated exposure control, adjustment of the mA and/or kV according to patient size and/or use of iterative reconstruction technique. COMPARISON:  CT C-spine 08/01/2022, CT head 08/01/2022 FINDINGS: CT HEAD FINDINGS Brain: Cerebral ventricle sizes are concordant with the degree of cerebral volume loss. Patchy and confluent areas of decreased attenuation are noted throughout the deep and periventricular white matter of the cerebral hemispheres bilaterally, compatible with chronic microvascular ischemic disease. No evidence of large-territorial acute infarction. No parenchymal hemorrhage. No mass lesion. No extra-axial collection. No mass effect or midline shift. No hydrocephalus. Basilar cisterns are patent. Vascular: No hyperdense vessel. Skull: No acute fracture or focal lesion. Temporomandibular joint degenerative changes. Sinuses/Orbits: Paranasal sinuses and mastoid air cells are clear. The orbits are unremarkable. Other: None. CT CERVICAL SPINE FINDINGS Alignment: Stable grade 2 anterolisthesis of C3 on C4. Stable grade 1 anterolisthesis of C7 on T1. Skull base and vertebrae: Multilevel moderate to severe degenerative changes of the spine with associated severe right C6-C7 osseous neural foraminal stenosis. No severe osseous central canal stenosis. No acute fracture. No aggressive appearing focal osseous lesion or focal pathologic process. Soft tissues and spinal canal: No prevertebral fluid or swelling. No visible canal hematoma. Upper chest: Unremarkable. Other: None. IMPRESSION: 1. No acute intracranial abnormality. 2. No acute displaced fracture or traumatic listhesis of the  cervical spine. 3. Stable grade 2 anterolisthesis of C3 on C4 and grade 1 on C7 on T1. 4. 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 Electronically Signed   By: Tish Frederickson M.D.   On: 08/02/2022 19:08   DG Lumbar Spine 2-3 Views  Result Date: 08/02/2022 CLINICAL DATA:  Back pain after fall EXAM: LUMBAR SPINE - 2-3 VIEW COMPARISON:  Lumbar spine x-ray 02/25/2016. CT chest abdomen and pelvis 01/20/2022. FINDINGS: There is mild compression deformity of the superior endplate of L2 which is new from prior. This is age indeterminate. Vertebral body heights are otherwise well maintained. Alignment is anatomic. There is mild disc space narrowing throughout the lumbar spine compatible with degenerative change similar to prior. There are degenerative changes of lower lumbar facet joints. Soft tissues are within normal limits. IMPRESSION: Mild compression deformity of the superior endplate of L2 which is new from prior. This is age indeterminate. Correlate with point tenderness. Electronically Signed   By: Darliss Cheney M.D.   On: 08/02/2022 18:58   DG Chest 2 View  Result Date: 08/02/2022 CLINICAL DATA:  Low O2 sat EXAM: CHEST - 2 VIEW COMPARISON:  Chest x-ray 01/16/2022 FINDINGS: The heart size and mediastinal contours are within normal limits. Both lungs are clear. The visualized skeletal structures are unremarkable. IMPRESSION: No active cardiopulmonary disease. Electronically Signed   By: Darliss Cheney M.D.   On: 08/02/2022 18:56   CT Cervical Spine Wo Contrast  Result Date: 08/01/2022 CLINICAL DATA:  Status post fall. EXAM: CT CERVICAL SPINE WITHOUT CONTRAST TECHNIQUE: Multidetector CT imaging of the cervical spine was performed without  intravenous contrast. Multiplanar CT image reconstructions were also generated. RADIATION DOSE REDUCTION: This exam was performed according to the departmental dose-optimization  program which includes automated exposure control, adjustment of the mA and/or kV according to patient size and/or use of iterative reconstruction technique. COMPARISON:  January 16, 2022 FINDINGS: Alignment: There is stable, chronic, approximately 4 mm anterolisthesis of the C3 vertebral body on C4. Stable, chronic anterolisthesis of C7 on T1 is also noted. Skull base and vertebrae: No acute fracture. No primary bone lesion or focal pathologic process. Degenerative changes are seen involving the tip of the dens and the adjacent portion of the anterior arch of C1. Soft tissues and spinal canal: No prevertebral fluid or swelling. No visible canal hematoma. Disc levels: Marked severity endplate sclerosis, anterior osteophyte formation and posterior bony spurring are seen at the levels of C4-C5, C5-C6, C6-C7 and C7-T1. There is marked severity narrowing of the anterior atlantoaxial articulation. Marked severity intervertebral disc space narrowing is seen at C4-C5, C5-C6, C6-C7 and C7-T1. Bilateral marked severity multilevel facet joint hypertrophy is noted. Upper chest: Emphysematous lung disease is seen within the bilateral upper lobes. Other: The left lobe of the thyroid gland is enlarged, lobulated and heterogeneous in appearance. IMPRESSION: 1. No acute fracture or traumatic subluxation of the cervical spine. 2. Stable, chronic, 4 mm anterolisthesis of the C3 vertebral body on C4. 3. Stable, chronic anterolisthesis of C7 on T1. 4. Marked severity multilevel degenerative changes, as described above. 5. Emphysematous lung disease. 6. Enlarged, lobulated and heterogeneous left lobe of the thyroid gland. Correlation with nonemergent thyroid ultrasound is recommended. Emphysema (ICD10-J43.9). Electronically Signed   By: Aram Candela M.D.   On: 08/01/2022 20:02   CT Head Wo Contrast  Result Date: 08/01/2022 CLINICAL DATA:  Head trauma EXAM: CT HEAD WITHOUT CONTRAST TECHNIQUE: Contiguous axial images were  obtained from the base of the skull through the vertex without intravenous contrast. RADIATION DOSE REDUCTION: This exam was performed according to the departmental dose-optimization program which includes automated exposure control, adjustment of the mA and/or kV according to patient size and/or use of iterative reconstruction technique. COMPARISON:  Head CT 01/16/2022 FINDINGS: Brain: No evidence of acute infarction, hemorrhage, hydrocephalus, extra-axial collection or mass lesion/mass effect. There is stable moderate patchy periventricular and deep white matter hypodensity. There is stable moderate diffuse atrophy. Vascular: Atherosclerotic calcifications are present within the cavernous internal carotid arteries. Skull: Normal. Negative for fracture or focal lesion. Sinuses/Orbits: No acute finding. Other: None. IMPRESSION: 1. No acute intracranial abnormality. 2. 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 Electronically Signed   By: Darliss Cheney M.D.   On: 08/01/2022 19:53   Physical Examination: Vitals:   08/02/22 2305 08/02/22 2310 08/02/22 2313 08/02/22 2317  BP:  (S) 117/71 103/60   Pulse: (S) 85 (!) 101 (S) 84 (S) 92  Temp:   98.7 F (37.1 C)   Resp:   (S) 17   Weight:      SpO2: (!) 87% (S) 92% (S) (!) 88% (S) 94%  TempSrc:   Oral   BMI (Calculated):       Physical Exam Vitals and nursing note reviewed.  Constitutional:      General: He is not in acute distress.    Appearance: Normal appearance. He is not ill-appearing, toxic-appearing or diaphoretic.  HENT:     Head: Normocephalic and atraumatic.     Right Ear: Hearing and external ear normal.     Left Ear: Hearing and external ear normal.  Nose: Nose normal. No nasal deformity.     Mouth/Throat:     Lips: Pink.     Mouth: Mucous membranes are moist.     Tongue: No lesions.     Pharynx: Oropharynx is clear.  Eyes:     Extraocular Movements: Extraocular movements intact.  Cardiovascular:     Rate  and Rhythm: Normal rate and regular rhythm.     Pulses: Normal pulses.     Heart sounds: Normal heart sounds.  Pulmonary:     Effort: Pulmonary effort is normal.     Breath sounds: Normal breath sounds.  Abdominal:     General: Bowel sounds are normal. There is no distension.     Palpations: Abdomen is soft. There is no mass.     Tenderness: There is no abdominal tenderness. There is no guarding.     Hernia: No hernia is present.  Musculoskeletal:     Right lower leg: No edema.     Left lower leg: No edema.  Skin:    General: Skin is warm.  Neurological:     General: No focal deficit present.     Mental Status: He is alert and oriented to person, place, and time.     Cranial Nerves: Cranial nerves 2-12 are intact.     Motor: Motor function is intact.  Psychiatric:        Attention and Perception: Attention normal.        Mood and Affect: Mood normal.        Speech: Speech normal.        Behavior: Behavior normal. Behavior is cooperative.        Cognition and Memory: Cognition normal.     Assessment and Plan: * Fall Suspect 2/2 orthostatic BP. We will obtain orthostatic vitals. Fall precaution. PT consult prior to discharge.   UTI (urinary tract infection) Urinalysis    Component Value Date/Time   COLORURINE YELLOW (A) 08/02/2022 2203   APPEARANCEUR CLEAR 08/02/2022 2203   APPEARANCEUR Hazy (A) 07/30/2022 1556   LABSPEC >1.030 (H) 08/02/2022 2203   PHURINE 5.5 08/02/2022 2203   GLUCOSEU NEGATIVE 08/02/2022 2203   HGBUR NEGATIVE 08/02/2022 2203   BILIRUBINUR SMALL (A) 08/02/2022 2203   BILIRUBINUR Negative 07/30/2022 1556   KETONESUR TRACE (A) 08/02/2022 2203   PROTEINUR NEGATIVE 08/02/2022 2203   NITRITE NEGATIVE 08/02/2022 2203   LEUKOCYTESUR TRACE (A) 08/02/2022 2203  Pt given fosfomycin. Recent Results (from the past 240 hour(s))  CULTURE, URINE COMPREHENSIVE     Status: None (Preliminary result)   Collection Time: 07/30/22  3:56 PM   Specimen: Urine   UR   Result Value Ref Range Status   Urine Culture, Comprehensive Preliminary report  Preliminary   Organism ID, Bacteria Comment  Preliminary    Comment: Microbiological testing to rule out the presence of possible pathogens is in progress. Greater than 100,000 colony forming units per mL   Microscopic Examination     Status: Abnormal   Collection Time: 07/30/22  3:56 PM   Urine  Result Value Ref Range Status   WBC, UA 11-30 (A) 0 - 5 /hpf Final   RBC, Urine 3-10 (A) 0 - 2 /hpf Final   Epithelial Cells (non renal) 0-10 0 - 10 /hpf Final   Casts Present (A) None seen /lpf Final   Cast Type Hyaline casts N/A Final   Mucus, UA Present (A) Not Estab. Final   Bacteria, UA Many (A) None seen/Few Final  Urine culture from 5/9  still pending.   Acute respiratory failure with hypoxia (HCC) Vitals:   08/02/22 2305 08/02/22 2310 08/02/22 2313 08/02/22 2317  BP:  (S) 117/71 103/60   Pulse: (S) 85 (!) 101 (S) 84 (S) 92  Temp:   98.7 F (37.1 C)   Resp:   (S) 17   Weight:      SpO2: (!) 87% (S) 92% (S) (!) 88% (S) 94%  TempSrc:   Oral   BMI (Calculated):      SpO2: (S) 94 % O2 Flow Rate (L/min): (S) 2 L/min Pt is on eliquis.  Chest xray negative. 10/2017 2 d echo " - Procedure narrative: Transthoracic echocardiography. Image    quality was poor. The study was technically difficult, as a    result of poor acoustic windows and poor sound wave transmission.    Intravenous contrast (Definity) was administered.  - Left ventricle: The cavity size was normal. Systolic function was    normal. The estimated ejection fraction was in the range of 60%    to 65%.  Cont with supplemental oxygen and we will get noncontrast chest ct  or V/Q scan as deemed needed and dimer. Pulmonary consult as needed.     Pulmonary embolus (HCC) Continue eliquis.    PAF (paroxysmal atrial fibrillation) (HCC) SR On EKG. Not scanned in chart.  Continue eliquis.  Chads score of > 4.    GERD (gastroesophageal  reflux disease) IV PPI.  Aspiration precaution.   Chronic anticoagulation Pt is on eliquis for his PAF.    Anxiety Cont Prozac and Wellbutrin XL. Marland Kitchen  Orthostatic hypotension Pending.     DVT prophylaxis:  Eliquis.  Code Status:  Full code.      08/01/2022    7:03 PM  Advanced Directives  Does Patient Have a Medical Advance Directive? No  Family Communication:  None. Emergency Contact: Contact Information     Name Relation Home Work Mobile   Shyheem, Oshinski Spouse [phone]  [phone]   Antonie, Fausnaugh   [phone]   Valente, KokerFredderick Severance   [phone]     Disposition Plan:  Home.  Consults: None. Admission status: Observation.  Unit / Expected LOS: Med tele/ 2 days.   Gertha Calkin MD [facility]  6 PM- 2 AM. [phone]( Pager )  For questions regarding this patient please use [email or web address] to contact the current [facility] MD.   Bonita Quin may also call [phone] to contact current Assigned [facility] Attending/Consulting MD for this patient.

## 2022-08-02 NOTE — ED Triage Notes (Addendum)
First nurse note: Arrived by EMS from home with c/o fall in bathroom today. Reports weakness and dizziness. Seen last night for fall.   Currently taking antibiotics for UTI  EMS vitals: 139/75 b/p 98HR 97.7oral 90% RA  Skin tears present on head and arm

## 2022-08-03 ENCOUNTER — Inpatient Hospital Stay (HOSPITAL_COMMUNITY)
Admit: 2022-08-03 | Discharge: 2022-08-03 | Disposition: A | Discharge status: Home / Self Care | Payer: Medicare Other | Attending: Student | Admitting: Student

## 2022-08-03 ENCOUNTER — Inpatient Hospital Stay: Payer: Medicare Other

## 2022-08-03 ENCOUNTER — Other Ambulatory Visit: Payer: Self-pay

## 2022-08-03 DIAGNOSIS — S32029A Unspecified fracture of second lumbar vertebra, initial encounter for closed fracture: Secondary | ICD-10-CM | POA: Diagnosis present

## 2022-08-03 DIAGNOSIS — N400 Enlarged prostate without lower urinary tract symptoms: Secondary | ICD-10-CM | POA: Diagnosis present

## 2022-08-03 DIAGNOSIS — Z85828 Personal history of other malignant neoplasm of skin: Secondary | ICD-10-CM | POA: Diagnosis not present

## 2022-08-03 DIAGNOSIS — I349 Nonrheumatic mitral valve disorder, unspecified: Secondary | ICD-10-CM

## 2022-08-03 DIAGNOSIS — W19XXXA Unspecified fall, initial encounter: Secondary | ICD-10-CM | POA: Diagnosis not present

## 2022-08-03 DIAGNOSIS — I951 Orthostatic hypotension: Secondary | ICD-10-CM

## 2022-08-03 DIAGNOSIS — W1830XA Fall on same level, unspecified, initial encounter: Secondary | ICD-10-CM | POA: Diagnosis not present

## 2022-08-03 DIAGNOSIS — Z23 Encounter for immunization: Secondary | ICD-10-CM | POA: Diagnosis present

## 2022-08-03 DIAGNOSIS — E860 Dehydration: Secondary | ICD-10-CM | POA: Diagnosis present

## 2022-08-03 DIAGNOSIS — N3000 Acute cystitis without hematuria: Secondary | ICD-10-CM | POA: Diagnosis present

## 2022-08-03 DIAGNOSIS — Y92009 Unspecified place in unspecified non-institutional (private) residence as the place of occurrence of the external cause: Secondary | ICD-10-CM | POA: Diagnosis not present

## 2022-08-03 DIAGNOSIS — F0393 Unspecified dementia, unspecified severity, with mood disturbance: Secondary | ICD-10-CM | POA: Diagnosis present

## 2022-08-03 DIAGNOSIS — Z7901 Long term (current) use of anticoagulants: Secondary | ICD-10-CM | POA: Diagnosis not present

## 2022-08-03 DIAGNOSIS — F419 Anxiety disorder, unspecified: Secondary | ICD-10-CM | POA: Diagnosis not present

## 2022-08-03 DIAGNOSIS — E785 Hyperlipidemia, unspecified: Secondary | ICD-10-CM | POA: Diagnosis present

## 2022-08-03 DIAGNOSIS — Z86010 Personal history of colonic polyps: Secondary | ICD-10-CM | POA: Diagnosis not present

## 2022-08-03 DIAGNOSIS — J9601 Acute respiratory failure with hypoxia: Secondary | ICD-10-CM | POA: Diagnosis present

## 2022-08-03 DIAGNOSIS — R270 Ataxia, unspecified: Secondary | ICD-10-CM | POA: Diagnosis present

## 2022-08-03 DIAGNOSIS — Z86711 Personal history of pulmonary embolism: Secondary | ICD-10-CM | POA: Diagnosis not present

## 2022-08-03 DIAGNOSIS — F0394 Unspecified dementia, unspecified severity, with anxiety: Secondary | ICD-10-CM | POA: Diagnosis present

## 2022-08-03 DIAGNOSIS — R296 Repeated falls: Secondary | ICD-10-CM | POA: Diagnosis present

## 2022-08-03 DIAGNOSIS — I480 Paroxysmal atrial fibrillation: Secondary | ICD-10-CM | POA: Diagnosis present

## 2022-08-03 DIAGNOSIS — F32A Depression, unspecified: Secondary | ICD-10-CM | POA: Diagnosis present

## 2022-08-03 DIAGNOSIS — G629 Polyneuropathy, unspecified: Secondary | ICD-10-CM | POA: Diagnosis present

## 2022-08-03 DIAGNOSIS — S0990XA Unspecified injury of head, initial encounter: Secondary | ICD-10-CM | POA: Diagnosis present

## 2022-08-03 DIAGNOSIS — D6851 Activated protein C resistance: Secondary | ICD-10-CM | POA: Diagnosis present

## 2022-08-03 DIAGNOSIS — Z1629 Resistance to other single specified antibiotic: Secondary | ICD-10-CM | POA: Diagnosis present

## 2022-08-03 DIAGNOSIS — I341 Nonrheumatic mitral (valve) prolapse: Secondary | ICD-10-CM | POA: Diagnosis present

## 2022-08-03 DIAGNOSIS — K219 Gastro-esophageal reflux disease without esophagitis: Secondary | ICD-10-CM | POA: Diagnosis present

## 2022-08-03 LAB — COMPREHENSIVE METABOLIC PANEL
ALT: 14 U/L (ref 0–44)
AST: 21 U/L (ref 15–41)
Albumin: 3.6 g/dL (ref 3.5–5.0)
Alkaline Phosphatase: 58 U/L (ref 38–126)
Anion gap: 8 (ref 5–15)
BUN: 19 mg/dL (ref 8–23)
CO2: 25 mmol/L (ref 22–32)
Calcium: 8.9 mg/dL (ref 8.9–10.3)
Chloride: 105 mmol/L (ref 98–111)
Creatinine, Ser: 1.02 mg/dL (ref 0.61–1.24)
GFR, Estimated: >60 mL/min (ref >60)
Glucose, Bld: 87 mg/dL (ref 70–99)
Potassium: 3.9 mmol/L (ref 3.5–5.1)
Sodium: 138 mmol/L (ref 135–145)
Total Bilirubin: 1.1 mg/dL (ref 0.3–1.2)
Total Protein: 6.6 g/dL (ref 6.5–8.1)

## 2022-08-03 LAB — CBC
HCT: 40.5 % (ref 39.0–52.0)
Hemoglobin: 13.3 g/dL (ref 13.0–17.0)
MCH: 30.9 pg (ref 26.0–34.0)
MCHC: 32.8 g/dL (ref 30.0–36.0)
MCV: 94.2 fL (ref 80.0–100.0)
Platelets: 200 10*3/uL (ref 150–400)
RBC: 4.3 MIL/uL (ref 4.22–5.81)
RDW: 13.5 % (ref 11.5–15.5)
WBC: 9 10*3/uL (ref 4.0–10.5)
nRBC: 0 % (ref 0.0–0.2)

## 2022-08-03 LAB — CULTURE, BLOOD (ROUTINE X 2)
Culture: NO GROWTH
Culture: NO GROWTH
Special Requests: ADEQUATE

## 2022-08-03 LAB — ECHOCARDIOGRAM COMPLETE
AR max vel: 2.26 cm2
AV Area VTI: 2.08 cm2
AV Area mean vel: 2 cm2
AV Mean grad: 6 mmHg
AV Peak grad: 11 mmHg
Ao pk vel: 1.66 m/s
Area-P 1/2: 4.31 cm2
Height: 76 in
MV VTI: 2.75 cm2
S' Lateral: 3.2 cm
Weight: 3601.43 oz

## 2022-08-03 LAB — PHOSPHORUS: Phosphorus: 3.3 mg/dL (ref 2.5–4.6)

## 2022-08-03 LAB — CULTURE, URINE COMPREHENSIVE

## 2022-08-03 LAB — FOLATE: Folate: 5.2 ng/mL — ABNORMAL LOW (ref >5.9)

## 2022-08-03 LAB — BRAIN NATRIURETIC PEPTIDE: B Natriuretic Peptide: 116.2 pg/mL — ABNORMAL HIGH (ref 0.0–100.0)

## 2022-08-03 LAB — TROPONIN I (HIGH SENSITIVITY)
Troponin I (High Sensitivity): 14 ng/L (ref <18)
Troponin I (High Sensitivity): 13 ng/L (ref <18)

## 2022-08-03 LAB — LACTIC ACID, PLASMA: Lactic Acid, Venous: 0.8 mmol/L (ref 0.5–1.9)

## 2022-08-03 LAB — MAGNESIUM: Magnesium: 2.2 mg/dL (ref 1.7–2.4)

## 2022-08-03 LAB — D-DIMER, QUANTITATIVE: D-Dimer, Quant: 1.77 ug/mL-FEU — ABNORMAL HIGH (ref 0.00–0.50)

## 2022-08-03 LAB — VITAMIN D 25 HYDROXY (VIT D DEFICIENCY, FRACTURES): Vit D, 25-Hydroxy: 57.97 ng/mL (ref 30–100)

## 2022-08-03 MED ORDER — APIXABAN 2.5 MG PO TABS
2.5000 mg | ORAL_TABLET | Freq: Two times a day (BID) | ORAL | Status: DC
  Administered 2022-08-03 – 2022-08-06 (×8): 2.5 mg via ORAL
  Filled 2022-08-03 (×9): qty 1

## 2022-08-03 MED ORDER — ACETAMINOPHEN 325 MG PO TABS: 650.0000 mg | ORAL_TABLET | Freq: Four times a day (QID) | ORAL | Status: DC | PRN

## 2022-08-03 MED ORDER — FINASTERIDE 5 MG PO TABS
5.0000 mg | ORAL_TABLET | Freq: Every day | ORAL | Status: DC
  Administered 2022-08-03 – 2022-08-06 (×4): 5 mg via ORAL
  Filled 2022-08-03 (×4): qty 1

## 2022-08-03 MED ORDER — FLUOXETINE HCL 20 MG PO CAPS
20.0000 mg | ORAL_CAPSULE | Freq: Every day | ORAL | Status: DC
  Administered 2022-08-03 – 2022-08-04 (×2): 20 mg via ORAL
  Filled 2022-08-03 (×2): qty 1

## 2022-08-03 MED ORDER — FLUOXETINE HCL 20 MG PO CAPS
40.0000 mg | ORAL_CAPSULE | Freq: Every day | ORAL | Status: DC
  Administered 2022-08-03 – 2022-08-04 (×2): 40 mg via ORAL
  Filled 2022-08-03 (×2): qty 2

## 2022-08-03 MED ORDER — ACETAMINOPHEN 650 MG RE SUPP: 650.0000 mg | Freq: Four times a day (QID) | RECTAL | Status: DC | PRN

## 2022-08-03 MED ORDER — LACTATED RINGERS IV SOLN: INTRAVENOUS | Status: AC

## 2022-08-03 MED ORDER — SODIUM CHLORIDE 0.9% FLUSH
3.0000 mL | Freq: Two times a day (BID) | INTRAVENOUS | Status: DC
  Administered 2022-08-03 – 2022-08-06 (×8): 3 mL via INTRAVENOUS

## 2022-08-03 MED ORDER — PANTOPRAZOLE SODIUM 40 MG IV SOLR
40.0000 mg | Freq: Two times a day (BID) | INTRAVENOUS | Status: DC
  Administered 2022-08-03 – 2022-08-04 (×4): 40 mg via INTRAVENOUS
  Filled 2022-08-03 (×4): qty 10

## 2022-08-03 MED ORDER — MEMANTINE HCL 5 MG PO TABS
5.0000 mg | ORAL_TABLET | Freq: Every day | ORAL | Status: DC
  Administered 2022-08-03 – 2022-08-06 (×4): 5 mg via ORAL
  Filled 2022-08-03 (×4): qty 1

## 2022-08-03 MED ORDER — PERFLUTREN LIPID MICROSPHERE
1.0000 mL | INTRAVENOUS | Status: AC | PRN
  Administered 2022-08-03: 3 mL via INTRAVENOUS

## 2022-08-03 MED ORDER — TAMSULOSIN HCL 0.4 MG PO CAPS
0.4000 mg | ORAL_CAPSULE | Freq: Every day | ORAL | Status: DC
  Administered 2022-08-03 – 2022-08-06 (×4): 0.4 mg via ORAL
  Filled 2022-08-03 (×4): qty 1

## 2022-08-03 MED ORDER — BUPROPION HCL ER (XL) 150 MG PO TB24: 150.0000 mg | ORAL_TABLET | Freq: Every day | ORAL | Status: DC

## 2022-08-03 MED ORDER — MORPHINE SULFATE (PF) 2 MG/ML IV SOLN: 2.0000 mg | INTRAVENOUS | Status: DC | PRN

## 2022-08-03 MED ORDER — SUCRALFATE 1 G PO TABS
1.0000 g | ORAL_TABLET | Freq: Three times a day (TID) | ORAL | Status: DC
  Administered 2022-08-03 (×2): 1 g via ORAL
  Filled 2022-08-03 (×3): qty 1

## 2022-08-03 NOTE — Progress Notes (Signed)
*  PRELIMINARY RESULTS* Echocardiogram 2D Echocardiogram has been performed.  Carolyne Fiscal 08/03/2022, 3:18 PM

## 2022-08-03 NOTE — Assessment & Plan Note (Signed)
Urinalysis    Component Value Date/Time   COLORURINE YELLOW (A) 08/02/2022 2203   APPEARANCEUR CLEAR 08/02/2022 2203   APPEARANCEUR Hazy (A) 07/30/2022 1556   LABSPEC >1.030 (H) 08/02/2022 2203   PHURINE 5.5 08/02/2022 2203   GLUCOSEU NEGATIVE 08/02/2022 2203   HGBUR NEGATIVE 08/02/2022 2203   BILIRUBINUR SMALL (A) 08/02/2022 2203   BILIRUBINUR Negative 07/30/2022 1556   KETONESUR TRACE (A) 08/02/2022 2203   PROTEINUR NEGATIVE 08/02/2022 2203   NITRITE NEGATIVE 08/02/2022 2203   LEUKOCYTESUR TRACE (A) 08/02/2022 2203  Pt given fosfomycin. Recent Results (from the past 240 hour(s))  CULTURE, URINE COMPREHENSIVE     Status: None (Preliminary result)   Collection Time: 07/30/22  3:56 PM   Specimen: Urine   UR  Result Value Ref Range Status   Urine Culture, Comprehensive Preliminary report  Preliminary   Organism ID, Bacteria Comment  Preliminary    Comment: Microbiological testing to rule out the presence of possible pathogens is in progress. Greater than 100,000 colony forming units per mL   Microscopic Examination     Status: Abnormal   Collection Time: 07/30/22  3:56 PM   Urine  Result Value Ref Range Status   WBC, UA 11-30 (A) 0 - 5 /hpf Final   RBC, Urine 3-10 (A) 0 - 2 /hpf Final   Epithelial Cells (non renal) 0-10 0 - 10 /hpf Final   Casts Present (A) None seen /lpf Final   Cast Type Hyaline casts N/A Final   Mucus, UA Present (A) Not Estab. Final   Bacteria, UA Many (A) None seen/Few Final  Urine culture from 5/9 still pending.

## 2022-08-03 NOTE — Assessment & Plan Note (Signed)
Continue eliquis  ?

## 2022-08-03 NOTE — Consult Note (Signed)
Pharmacy Antibiotic Note  Mark [name]. is a 82 y.o. male admitted on 08/02/2022 with UTI.  Pharmacy has been consulted for antibiotic dosing.  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 Pt also endorses dysuria and urinary frequency that has persisted despite current treatment with Bactrim DS, prescribed for him on 5/9 for similar UTI symptoms. Pt has a history of recurrent UTIs and underwent dorsal split procedure for phomitis in 10/2021. Ucx from 5/9 encounter are still pending but last Ucx from 05/2022 grew MRSE which was resistant to Bactrim. Patient given fosfomycin 3 g PO x 1 overnight in ED. UA not consistent with UTI as WBC only 6-10 but had been taking Bactrim for 3 days prior to this collection.  Plan: Patient given fosfomycin 3 g PO x 1 overnight in ED. Follow up culture results to assess for antibiotic optimization Monitor for continued UTI symptoms (dysuria, urinary frequency) to assess if another dose of fosfomycin will be needed 48-72 h after first  Height: 6\' 4"  (193 cm) Weight: 102.1 kg (225 lb 1.4 oz) IBW/kg (Calculated) : 86.8  Temp (24hrs), Avg:98.5 F (36.9 C), Min:98 F (36.7 C), Max:98.9 F (37.2 C)  Recent Labs  Lab 08/02/22 1808 08/02/22 2154 08/02/22 2232 08/03/22 0422  WBC 10.7*  --   --  9.0  CREATININE 1.12  --   --  1.02  LATICACIDVEN  --  1.1 0.8  --     Estimated Creatinine Clearance: 67.4 mL/min (by C-G formula based on SCr of 1.02 mg/dL).    Allergies  Allergen Reactions   Aricept [Donepezil]     Hallucination, confusion   Ciprofloxacin Other (See Comments)   Erythromycin Swelling   Keflex [Cephalexin] Swelling   Penicillins Swelling    Has patient had a PCN reaction causing immediate rash, facial/tongue/throat swelling, SOB or lightheadedness with hypotension: Yes Has patient had a PCN reaction causing severe rash involving mucus membranes or skin  necrosis: No Has patient had a PCN reaction that required hospitalization: Unknown Has patient had a PCN reaction occurring within the last 10 years: No If all of the above answers are "NO", then may proceed with Cephalosporin use.     Antimicrobials this admission: Fosfomycin 5/12 x 1  Dose adjustments this admission: N/A  Microbiology results: 5/12 BCx: NG<12h 5/09 Ucx (collected as outpatient) : in process    Thank you for allowing pharmacy to be a part of this patient's care.  Will M. Dareen Piano, PharmD PGY-1 Pharmacy Resident 08/03/2022 12:20 PM

## 2022-08-03 NOTE — Evaluation (Signed)
Occupational Therapy Evaluation Patient Details Name: Mark [name]. MRN: [id number] DOB: 02/16/1941 Today's Date: 08/03/2022   History of Present Illness Pt is an 82 y/o M admitted on 08/02/22 after presenting with c/o falling from his bed to the ground & hitting his head & back. Pt notes multiple falls in the past 3-4 days & has been taking medication for UTI. Pt is being treated for fall & UTI. PMH: anxiety, a-fib, chronic back pain, dementia, GERD, heart murmur, HLD, mitral valve prolapse, neuropathy, OA, PE, spinal stenosis, vertigo   Clinical Impression   Patient agreeable to OT evaluation. Spouse present. Pt presenting with decreased independence in self care, balance, functional mobility/transfers, endurance, and safety awareness. At baseline pt is normally Mod I for ADLs and functional mobility using a RW, however, requiring increased assistance recently. Pt is HOH (hearing aides not present) which impacts session. Pt endorsed back pain t/o session. Pt currently functioning at Min A for supine to sit, supervision for sit to supine, and Mod A for STS from elevated EOB using RW. Pt c/o dizziness upon standing and demonstrated heavy posterior lean requiring Max A from therapist to correct. Further mobility not safe to attempt at this time. Attempted to take orthostatic vitals, however, pt unable to tolerate standing long enough to get BP reading (see further details below). Pt will benefit from skilled acute OT services to address deficits noted below. OT recommends ongoing therapy upon discharge to maximize safety and independence with ADLs, decrease fall risk, decrease caregiver burden, and promote return to PLOF.    Orthostatic vitals: BP-supine: 119/62 (80) BP-sitting: 92/78 (83)   Recommendations for follow up therapy are one component of a multi-disciplinary discharge planning process, led by the attending physician.  Recommendations may be updated based on patient status, additional  functional criteria and insurance authorization.   Assistance Recommended at Discharge Frequent or constant Supervision/Assistance  Patient can return home with the following Two people to help with walking and/or transfers;Two people to help with bathing/dressing/bathroom;Assistance with cooking/housework;Assist for transportation;Help with stairs or ramp for entrance;Direct supervision/assist for financial management;Direct supervision/assist for medications management    Functional Status Assessment  Patient has had a recent decline in their functional status and demonstrates the ability to make significant improvements in function in a reasonable and predictable amount of time.  Equipment Recommendations  Other (comment) (defer to next venue of care)    Recommendations for Other Services       Precautions / Restrictions Precautions Precautions: Fall Restrictions Weight Bearing Restrictions: No      Mobility Bed Mobility Overal bed mobility: Needs Assistance Bed Mobility: Supine to Sit, Sit to Supine     Supine to sit: Min assist, HOB elevated (assist for trunk elevation) Sit to supine: Supervision, HOB elevated   General bed mobility comments: Min A to scoot hips forward at EOB    Transfers Overall transfer level: Needs assistance Equipment used: Rolling walker (2 wheels) Transfers: Sit to/from Stand Sit to Stand: Mod assist, From elevated surface           General transfer comment: VC for safe technique      Balance Overall balance assessment: Needs assistance, History of Falls Sitting-balance support: Feet supported, Bilateral upper extremity supported Sitting balance-Leahy Scale: Fair   Postural control: Posterior lean Standing balance support: Bilateral upper extremity supported, Reliant on assistive device for balance Standing balance-Leahy Scale: Poor Standing balance comment: heavy posterior lean in standing, unable to self-correct despite mulitmodal  cues and required  Max A from therapist         ADL either performed or assessed with clinical judgement   ADL Overall ADL's : Needs assistance/impaired     Grooming: Set up;Sitting;Supervision/safety;Wash/dry face               Lower Body Dressing: Maximal assistance;Sitting/lateral leans Lower Body Dressing Details (indicate cue type and reason): socks Toilet Transfer: Rolling walker (2 wheels);Moderate assistance Toilet Transfer Details (indicate cue type and reason): simulated with STS from EOB Toileting- Clothing Manipulation and Hygiene: Maximal assistance;Sit to/from stand Toileting - Clothing Manipulation Details (indicate cue type and reason): anticipate             Vision Patient Visual Report: No change from baseline       Perception     Praxis      Pertinent Vitals/Pain Pain Assessment Pain Assessment: Faces Faces Pain Scale: Hurts little more Pain Location: back pain Pain Descriptors / Indicators: Discomfort Pain Intervention(s): Limited activity within patient's tolerance, Monitored during session, Repositioned     Hand Dominance Right   Extremity/Trunk Assessment Upper Extremity Assessment Upper Extremity Assessment: Generalized weakness   Lower Extremity Assessment Lower Extremity Assessment: Generalized weakness       Communication Communication Communication: HOH (hearing aides not present)   Cognition Arousal/Alertness: Awake/alert Behavior During Therapy: WFL for tasks assessed/performed, Anxious Overall Cognitive Status: History of cognitive impairments - at baseline Area of Impairment: Memory, Attention, Following commands, Safety/judgement, Problem solving, Awareness, Orientation                 Orientation Level: Disoriented to, Place Current Attention Level: Focused Memory: Decreased recall of precautions, Decreased short-term memory Following Commands: Follows one step commands inconsistently, Follows one step  commands with increased time Safety/Judgement: Decreased awareness of safety, Decreased awareness of deficits Awareness: Intellectual Problem Solving: Slow processing, Decreased initiation, Difficulty sequencing, Requires verbal cues General Comments: Pt with h/o dementia at baseline. Limited by [facility], generally anxious with mobility. Wife reports pt is more confused compared to baseline.     General Comments       Exercises Other Exercises Other Exercises: OT provided education re: role of OT, OT POC, post acute recs, sitting up for all meals, EOB/OOB mobility with assistance, home/fall safety.     Shoulder Instructions      Home Living Family/patient expects to be discharged to:: Private residence Living Arrangements: Spouse/significant other Available Help at Discharge: Family Type of Home: House Home Access: Ramped entrance     Home Layout: One level     Bathroom Shower/Tub: Producer, television/film/video: Handicapped height     Home Equipment: Rollator (4 wheels);Wheelchair - manual;Shower seat;Grab bars - tub/shower          Prior Functioning/Environment Prior Level of Function : History of Falls (last six months)             Mobility Comments: Pt was ambulatory with rollator until this past week when pt required assistance & use of w/c for safe mobility. Pt's wife notes pt has had 4 falls in the past 2 days. ADLs Comments: Pt reports normally Mod I for ADLs, however, requiring assistance from wife recently. Pt does not drive, family provides transportation.        OT Problem List: Decreased strength;Decreased coordination;Decreased cognition;Decreased safety awareness;Decreased knowledge of use of DME or AE;Decreased knowledge of precautions;Pain;Decreased activity tolerance;Impaired balance (sitting and/or standing)      OT Treatment/Interventions: Self-care/ADL training;Therapeutic exercise;Energy conservation;DME and/or AE instruction;Therapeutic  activities;Patient/family education;Balance training    OT Goals(Current goals can be found in the care plan section) Acute Rehab OT Goals Patient Stated Goal: return to PLOF OT Goal Formulation: With patient/family Time For Goal Achievement: 08/17/22 Potential to Achieve Goals: Fair   OT Frequency: Min 2X/week    Co-evaluation              AM-PAC OT "6 Clicks" Daily Activity     Outcome Measure Help from another person eating meals?: A Little Help from another person taking care of personal grooming?: A Little Help from another person toileting, which includes using toliet, bedpan, or urinal?: A Lot Help from another person bathing (including washing, rinsing, drying)?: A Lot Help from another person to put on and taking off regular upper body clothing?: A Little Help from another person to put on and taking off regular lower body clothing?: A Lot 6 Click Score: 15   End of Session Equipment Utilized During Treatment: Gait belt;Rolling walker (2 wheels) Nurse Communication: Mobility status;Other (comment) (BP)  Activity Tolerance: Other (comment);Patient limited by pain (limited by orthostatic hypotension) Patient left: in bed;with call bell/phone within reach;with bed alarm set;with family/visitor present  OT Visit Diagnosis: Unsteadiness on feet (R26.81);Muscle weakness (generalized) (M62.81);History of falling (Z91.81);Pain Pain - part of body:  (low back)                Time: 4098-1191 OT Time Calculation (min): 21 min Charges:  OT General Charges $OT Visit: 1 Visit OT Evaluation $OT Eval Moderate Complexity: 1 Mod  [facility] MS, OTR/L ascom [phone]  08/03/22, 5:33 PM

## 2022-08-03 NOTE — Progress Notes (Signed)
[facility] Progress Note  Patient: Mark [name].    ZOX:[id number]  DOA: 08/02/2022     Date of Service: the patient was seen and examined on 08/03/2022  Chief Complaint  Patient presents with   Fall   Brief hospital course: Keiren Delamarter. is an 82 y.o. 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  As per patient he has fallen multiple times in the past 3 to 4 days and has been so weak.  Patient had UTI recently seen by urology and received Bactrim.  Patient still has symptoms of urinary frequency and dysuria.  ED workup, UA negative, CT head negative for any acute findings.  CT neck negative for any acute. - X-ray lumbar spine Mild compression deformity of the superior endplate of L2 which is new from prior. This is age indeterminate. Correlate with point tenderness.  Assessment and Plan:  # Orthostatic hypotension Patient presented with recurrent falls, positive for orthostatic hypotension Continue IV fluid for gentle hydration Monitor vital signs Follow 2D echocardiogram Patient was advised to move slowly and continue fall precautions PT and OT consulted  # History of neuromuscular disorder, ataxia and peripheral neuropathy Currently patient is not on any medication,  patient is following neurology as an outpatient.  # L2 fracture status post fall X-ray shows mild compression deformity superior endplate of L2 [place]ane continue as needed medication for pain control Follow CT scan lumbar spine and CT pelvis   # Recurrent UTI, patient recently finished Bactrim, patient was following urology as an outpatient. 5/12 patient received 1 dose of fosfomycin 3 g  # History of factor V Leiden deficiency and pulmonary  embolism Patient is on Eliquis 2.5 mg p.o. twice daily, patient follows hematology as an outpatient It seems patient is on low-dose, recommended to follow-up with hematology   #Anxiety and depression, continued Prozac home dose #Dementia, continued Namenda home dose #GERD, continue PPI # Abnormal thyroid gland, incidental finding lobulated heterogeneous left lobe of the thyroid with recommendations for ultrasound.  # BPH, continued Flomax and finasteride   Body mass index is 27.4 kg/m.  Interventions:       Diet: Heart healthy diet DVT Prophylaxis: Therapeutic Anticoagulation with Eliquis    Advance goals of care discussion: Full code  Family Communication: family was present at bedside, at the time of interview.  The pt provided permission to discuss medical plan with the family. Opportunity was given to ask question and all questions were answered satisfactorily.   Disposition:  Pt is from Home, admitted with frequent falls, orthostatic hypotension, L2 fracture, still has back pain, difficulty walking, which precludes a safe discharge. Discharge to home with home health services versus SNF, TBD after PT/OT eval, when clinically stable, may need 1-2 more days to stay in the hospital..  Subjective: No significant events overnight, patient still has orthostatic hypotension and feels dizzy while sitting and standing.  Patient remains at high risk for fall.  Patient was complaining of having hip pain more in the right leg on movement, unable to bear weight on the legs, feels weakness.  Denies any chest pain or palpitation, no shortness of breath.  Physical Exam: General: NAD, lying comfortably Appear in no distress, affect appropriate Eyes: PERRLA ENT: Oral Mucosa Clear, moist  Neck:  no JVD,  Cardiovascular: S1 and S2 Present, no Murmur,  Respiratory: good respiratory effort, Bilateral Air entry equal and Decreased, no Crackles, no wheezes Abdomen: Bowel Sound present, Soft and  no tenderness,  Skin: no rashes Extremities: no Pedal edema, no calf tenderness Neurologic: without any new focal findings Gait not checked due to patient safety concerns  Vitals:   08/03/22 0312 08/03/22 0830 08/03/22 1224 08/03/22 1551  BP: 107/69 135/76 128/66 105/82  Pulse: 92 91 93 91  Resp: 19 18 20 20   Temp: 98.5 F (36.9 C) 98 F (36.7 C) 98.6 F (37 C) 98.2 F (36.8 C)  TempSrc:  Oral  Oral  SpO2: 92% 91% 91% 96%  Weight: 102.1 kg     Height: 6\' 4"  (1.93 m)       Intake/Output Summary (Last 24 hours) at 08/03/2022 1606 Last data filed at 08/03/2022 1433 Gross per 24 hour  Intake 642.2 ml  Output 200 ml  Net 442.2 ml   Filed Weights   08/02/22 1802 08/03/22 0312  Weight: 110 kg 102.1 kg    Data Reviewed: I have personally reviewed and interpreted daily labs, tele strips, imagings as discussed above. I reviewed all nursing notes, pharmacy notes, vitals, pertinent old records I have discussed plan of care as described above with RN and patient/family.  CBC: Recent Labs  Lab 08/02/22 1808 08/03/22 0422  WBC 10.7* 9.0  HGB 15.0 13.3  HCT 44.7 40.5  MCV 92.2 94.2  PLT 225 200   Basic Metabolic Panel: Recent Labs  Lab 08/02/22 1808 08/03/22 0422 08/03/22 0654  NA 137 138  --   K 4.2 3.9  --   CL 102 105  --   CO2 22 25  --   GLUCOSE 114* 87  --   BUN 19 19  --   CREATININE 1.12 1.02  --   CALCIUM 9.2 8.9  --   MG  --   --  2.2  PHOS  --   --  3.3    Studies: CT Head Wo Contrast  Result Date: 08/02/2022 CLINICAL DATA:  Head trauma, minor (Age >= 65y); Neck trauma (Age >= 65y) EXAM: CT HEAD WITHOUT CONTRAST CT CERVICAL SPINE WITHOUT CONTRAST TECHNIQUE: Multidetector CT imaging of the head and cervical spine was performed following the standard protocol without intravenous contrast. Multiplanar CT image reconstructions of the cervical spine were also generated. RADIATION DOSE REDUCTION: This exam was performed according to the departmental  dose-optimization program which includes automated exposure control, adjustment of the mA and/or kV according to patient size and/or use of iterative reconstruction technique. COMPARISON:  CT C-spine 08/01/2022, CT head 08/01/2022 FINDINGS: CT HEAD FINDINGS Brain: Cerebral ventricle sizes are concordant with the degree of cerebral volume loss. Patchy and confluent areas of decreased attenuation are noted throughout the deep and periventricular white matter of the cerebral hemispheres bilaterally, compatible with chronic microvascular ischemic disease. No evidence of large-territorial acute infarction. No parenchymal hemorrhage. No mass lesion. No extra-axial collection. No mass effect or midline shift. No hydrocephalus. Basilar cisterns are patent. Vascular: No hyperdense vessel. Skull: No acute fracture or focal lesion. Temporomandibular joint degenerative changes. Sinuses/Orbits: Paranasal sinuses and mastoid air cells are clear. The orbits are unremarkable. Other: None. CT CERVICAL SPINE FINDINGS Alignment: Stable grade 2 anterolisthesis of C3 on C4. Stable grade 1 anterolisthesis of C7 on T1. Skull base and vertebrae: Multilevel moderate to severe degenerative changes of the spine with associated severe right C6-C7 osseous neural foraminal stenosis. No  severe osseous central canal stenosis. No acute fracture. No aggressive appearing focal osseous lesion or focal pathologic process. Soft tissues and spinal canal: No prevertebral fluid or swelling. No visible canal hematoma. Upper chest: Unremarkable. Other: None. IMPRESSION: 1. No acute intracranial abnormality. 2. No acute displaced fracture or traumatic listhesis of the cervical spine. 3. Stable grade 2 anterolisthesis of C3 on C4 and grade 1 on C7 on T1. 4. 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 Electronically Signed   By: Tish Frederickson M.D.   On: 08/02/2022 19:08    CT Cervical Spine Wo Contrast  Result Date: 08/02/2022 CLINICAL DATA:  Head trauma, minor (Age >= 65y); Neck trauma (Age >= 65y) EXAM: CT HEAD WITHOUT CONTRAST CT CERVICAL SPINE WITHOUT CONTRAST TECHNIQUE: Multidetector CT imaging of the head and cervical spine was performed following the standard protocol without intravenous contrast. Multiplanar CT image reconstructions of the cervical spine were also generated. RADIATION DOSE REDUCTION: This exam was performed according to the departmental dose-optimization program which includes automated exposure control, adjustment of the mA and/or kV according to patient size and/or use of iterative reconstruction technique. COMPARISON:  CT C-spine 08/01/2022, CT head 08/01/2022 FINDINGS: CT HEAD FINDINGS Brain: Cerebral ventricle sizes are concordant with the degree of cerebral volume loss. Patchy and confluent areas of decreased attenuation are noted throughout the deep and periventricular white matter of the cerebral hemispheres bilaterally, compatible with chronic microvascular ischemic disease. No evidence of large-territorial acute infarction. No parenchymal hemorrhage. No mass lesion. No extra-axial collection. No mass effect or midline shift. No hydrocephalus. Basilar cisterns are patent. Vascular: No hyperdense vessel. Skull: No acute fracture or focal lesion. Temporomandibular joint degenerative changes. Sinuses/Orbits: Paranasal sinuses and mastoid air cells are clear. The orbits are unremarkable. Other: None. CT CERVICAL SPINE FINDINGS Alignment: Stable grade 2 anterolisthesis of C3 on C4. Stable grade 1 anterolisthesis of C7 on T1. Skull base and vertebrae: Multilevel moderate to severe degenerative changes of the spine with associated severe right C6-C7 osseous neural foraminal stenosis. No severe osseous central canal stenosis. No acute fracture. No aggressive appearing focal osseous lesion or focal pathologic process. Soft tissues and spinal canal: No  prevertebral fluid or swelling. No visible canal hematoma. Upper chest: Unremarkable. Other: None. IMPRESSION: 1. No acute intracranial abnormality. 2. No acute displaced fracture or traumatic listhesis of the cervical spine. 3. Stable grade 2 anterolisthesis of C3 on C4 and grade 1 on C7 on T1. 4. 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 Electronically Signed   By: Tish Frederickson M.D.   On: 08/02/2022 19:08   DG Lumbar Spine 2-3 Views  Result Date: 08/02/2022 CLINICAL DATA:  Back pain after fall EXAM: LUMBAR SPINE - 2-3 VIEW COMPARISON:  Lumbar spine x-ray 02/25/2016. CT chest abdomen and pelvis 01/20/2022. FINDINGS: There is mild compression deformity of the superior endplate of L2 which is new from prior. This is age indeterminate. Vertebral body heights are otherwise well maintained. Alignment is anatomic. There is mild disc space narrowing throughout the lumbar spine compatible with degenerative change similar to prior. There are degenerative changes of lower lumbar facet joints. Soft tissues are within normal limits. IMPRESSION: Mild compression deformity of the superior endplate of L2 which is new from prior. This is age indeterminate. Correlate with point tenderness. Electronically Signed   By: Darliss Cheney M.D.   On: 08/02/2022 18:58  DG Chest 2 View  Result Date: 08/02/2022 CLINICAL DATA:  Low O2 sat EXAM: CHEST - 2 VIEW COMPARISON:  Chest x-ray 01/16/2022 FINDINGS: The heart size and mediastinal contours are within normal limits. Both lungs are clear. The visualized skeletal structures are unremarkable. IMPRESSION: No active cardiopulmonary disease. Electronically Signed   By: Darliss Cheney M.D.   On: 08/02/2022 18:56    Scheduled Meds:  apixaban  2.5 mg Oral Q12H   finasteride  5 mg Oral Daily   FLUoxetine  20 mg Oral Daily   FLUoxetine  40 mg Oral Daily   memantine  5 mg Oral Daily    pantoprazole (PROTONIX) IV  40 mg Intravenous Q12H   sodium chloride flush  3 mL Intravenous Q12H   tamsulosin  0.4 mg Oral Daily   Continuous Infusions:  lactated ringers 50 mL/hr at 08/03/22 0146   PRN Meds: acetaminophen **OR** acetaminophen, morphine injection  Time spent: 35 minutes  Author: Gillis Santa. MD [facility] 08/03/2022 4:06 PM  To reach On-call, see care teams to locate the attending and reach out to them via [email or web address]. If 7PM-7AM, please contact night-coverage If you still have difficulty reaching the attending provider, please page the [facility] (Director on Call) for [facility] on amion for assistance.

## 2022-08-03 NOTE — Evaluation (Signed)
Physical Therapy Evaluation Patient Details Name: Mark [name]. MRN: [id number] DOB: [birth year] Today's Date: 08/03/2022  History of Present Illness  Pt is an 82 y/o M admitted on 08/02/22 after presenting with c/o falling from his bed to the ground & hitting his head & back. Pt notes multiple falls in the past 3-4 days & has been taking medication for UTI. Pt is being treated for fall & UTI. PMH: anxiety, a-fib, chronic back pain, dementia, GERD, heart murmur, HLD, mitral valve prolapse, neuropathy, OA, PE, spinal stenosis, vertigo  Clinical Impression  Pt seen for PT evaluation with pt's wife & son present for session. Pt's wife reports pt was ambulatory with rollator but has declined this past week, having 4 falls in the 2 days prior to admission. Pt is HOH & doesn't have hearing aides, which impacts session, but pt's wife notes pt is more confused compared to baseline. Pt does c/o slight dizziness with supine>sit but otherwise no c/o this (notes dizziness began ~a month ago). Pt is limited in standing tolerance 2/2 low back pain that started last night. Pt does require mod assist for STS at EOB with max cuing for technique & safety & is positive for orthostatic hypotension. Gait deferred during this session. Recommend ongoing PT services to address strengthening, balance, endurance, & progress transfers & gait with LRAD. Encouraged pt to increase water intake & sit upright in bed to help BP adjust.  BP checked in LUE: Supine: 124/68 mmHg (MAP 85), HR 91 bpm Sitting: 92/78 mmHg (MAP 83), HR 100 bpm Pt attempted standing BP but unable to tolerate, so BP sitting after standing: 73/40 mmHg (MAP 52), HR 102 bpm Semi fowler in bed: 148/76 mmHg (MAP 96), HR 90 bpm   Recommendations for follow up therapy are one component of a multi-disciplinary discharge planning process, led by the attending physician.  Recommendations may be updated based on patient status, additional functional criteria and  insurance authorization.  Follow Up Recommendations Can patient physically be transported by private vehicle: No     Assistance Recommended at Discharge Frequent or constant Supervision/Assistance  Patient can return home with the following  Two people to help with walking and/or transfers;Two people to help with bathing/dressing/bathroom;Direct supervision/assist for medications management;Help with stairs or ramp for entrance;Assistance with feeding;Assist for transportation;Assistance with cooking/housework;Direct supervision/assist for financial management    Equipment Recommendations None recommended by PT (TBD in next venue)  Recommendations for Other Services       Functional Status Assessment Patient has had a recent decline in their functional status and demonstrates the ability to make significant improvements in function in a reasonable and predictable amount of time.     Precautions / Restrictions Precautions Precautions: Fall Restrictions Weight Bearing Restrictions: No      Mobility  Bed Mobility Overal bed mobility: Needs Assistance Bed Mobility: Supine to Sit, Sit to Supine     Supine to sit: Supervision, HOB elevated Sit to supine: Supervision, HOB elevated   General bed mobility comments: use of bed rails, HOB elevated    Transfers Overall transfer level: Needs assistance Equipment used: Rolling walker (2 wheels) Transfers: Sit to/from Stand Sit to Stand: Mod assist           General transfer comment: Education re: need to scoot out to edge of seat, push up to standing with at least 1 UE on EOB. Pt requires mod assist for STS from elevated EOB.    Ambulation/Gait  Stairs            Wheelchair Mobility    Modified Rankin (Stroke Patients Only)       Balance Overall balance assessment: Needs assistance, History of Falls Sitting-balance support: Feet supported, Bilateral upper extremity supported Sitting  balance-Leahy Scale: Fair Sitting balance - Comments: close supervision static sitting   Standing balance support: Bilateral upper extremity supported, Reliant on assistive device for balance Standing balance-Leahy Scale: Poor                               Pertinent Vitals/Pain Pain Assessment Pain Assessment: Faces Faces Pain Scale: Hurts even more Pain Location: back pain, increases with mobility Pain Descriptors / Indicators: Discomfort Pain Intervention(s): Limited activity within patient's tolerance, Monitored during session, Repositioned    Home Living Family/patient expects to be discharged to:: Private residence Living Arrangements: Spouse/significant other Available Help at Discharge: Family Type of Home: House Home Access: Ramped entrance       Home Layout: One level Home Equipment: Rollator (4 wheels);Wheelchair - manual      Prior Function               Mobility Comments: Pt was ambulatory with rollator until this past week when pt required assistance & use of w/c for safe mobility. Pt's wife notes pt has had 4 falls in the past 2 days.       Hand Dominance        Extremity/Trunk Assessment   Upper Extremity Assessment Upper Extremity Assessment: Generalized weakness    Lower Extremity Assessment Lower Extremity Assessment: Generalized weakness (some shaking noted with movement)       Communication   Communication: HOH (does not have hearing aides with him)  Cognition Arousal/Alertness: Awake/alert Behavior During Therapy: Anxious Overall Cognitive Status: Impaired/Different from baseline Area of Impairment: Memory, Attention, Following commands, Safety/judgement, Problem solving, Awareness, Orientation                 Orientation Level: Place (oriented to hospital but reports he's in [place] at [facility] with PT re-orienting him)   Memory: Decreased recall of precautions, Decreased short-term memory Following Commands:  Follows one step commands inconsistently, Follows one step commands with increased time Safety/Judgement: Decreased awareness of safety, Decreased awareness of deficits Awareness: Intellectual Problem Solving: Slow processing, Decreased initiation, Difficulty sequencing, Requires verbal cues General Comments: Also limited by [facility] Pt reports he's not anxious with mobility but appears that way to this PT & pt's wife.        General Comments      Exercises     Assessment/Plan    PT Assessment Patient needs continued PT services  PT Problem List Decreased strength;Decreased coordination;Decreased cognition;Decreased knowledge of use of DME;Decreased activity tolerance;Decreased balance;Decreased safety awareness;Decreased mobility;Decreased knowledge of precautions;Pain       PT Treatment Interventions DME instruction;Therapeutic exercise;Gait training;Balance training;Stair training;Neuromuscular re-education;Functional mobility training;Patient/family education;Therapeutic activities;Cognitive remediation;Modalities    PT Goals (Current goals can be found in the Care Plan section)  Acute Rehab PT Goals Patient Stated Goal: get better PT Goal Formulation: With patient/family Time For Goal Achievement: 08/17/22 Potential to Achieve Goals: Fair    Frequency Min 3X/week     Co-evaluation               AM-PAC PT "6 Clicks" Mobility  Outcome Measure Help needed turning from your back to your side while in a flat bed without using bedrails?: A  Little Help needed moving from lying on your back to sitting on the side of a flat bed without using bedrails?: A Little Help needed moving to and from a bed to a chair (including a wheelchair)?: A Lot Help needed standing up from a chair using your arms (e.g., wheelchair or bedside chair)?: A Lot Help needed to walk in hospital room?: Total Help needed climbing 3-5 steps with a railing? : Total 6 Click Score: 12    End of Session  Equipment Utilized During Treatment: Gait belt Activity Tolerance: Patient limited by pain (limited by orthostatic hypotension) Patient left: in bed;with bed alarm set;with call bell/phone within reach;with family/visitor present Nurse Communication:  (BP) PT Visit Diagnosis: Unsteadiness on feet (R26.81);Other abnormalities of gait and mobility (R26.89);Difficulty in walking, not elsewhere classified (R26.2);Muscle weakness (generalized) (M62.81);History of falling (Z91.81)    Time: 5284-1324 PT Time Calculation (min) (ACUTE ONLY): 21 min   Charges:   PT Evaluation $PT Eval Moderate Complexity: 1 Mod          Aleda Grana, PT, DPT 08/03/22, 12:11 PM   Sandi Mariscal 08/03/2022, 12:08 PM

## 2022-08-03 NOTE — Hospital Course (Signed)
Supine 146/83  / 85 Sitting 117/71  / 101 Standing 103/60 / 84 and 88% on RA.

## 2022-08-04 DIAGNOSIS — W19XXXA Unspecified fall, initial encounter: Secondary | ICD-10-CM | POA: Diagnosis not present

## 2022-08-04 DIAGNOSIS — F419 Anxiety disorder, unspecified: Secondary | ICD-10-CM | POA: Diagnosis not present

## 2022-08-04 DIAGNOSIS — I951 Orthostatic hypotension: Secondary | ICD-10-CM | POA: Diagnosis not present

## 2022-08-04 LAB — BASIC METABOLIC PANEL
Anion gap: 9 (ref 5–15)
BUN: 18 mg/dL (ref 8–23)
CO2: 24 mmol/L (ref 22–32)
Calcium: 8.4 mg/dL — ABNORMAL LOW (ref 8.9–10.3)
Chloride: 102 mmol/L (ref 98–111)
Creatinine, Ser: 0.9 mg/dL (ref 0.61–1.24)
GFR, Estimated: >60 mL/min (ref >60)
Glucose, Bld: 96 mg/dL (ref 70–99)
Potassium: 3.8 mmol/L (ref 3.5–5.1)
Sodium: 135 mmol/L (ref 135–145)

## 2022-08-04 LAB — CBC
HCT: 40.2 % (ref 39.0–52.0)
Hemoglobin: 13.2 g/dL (ref 13.0–17.0)
MCH: 30.5 pg (ref 26.0–34.0)
MCHC: 32.8 g/dL (ref 30.0–36.0)
MCV: 92.8 fL (ref 80.0–100.0)
Platelets: 188 10*3/uL (ref 150–400)
RBC: 4.33 MIL/uL (ref 4.22–5.81)
RDW: 13.2 % (ref 11.5–15.5)
WBC: 8.8 10*3/uL (ref 4.0–10.5)
nRBC: 0 % (ref 0.0–0.2)

## 2022-08-04 LAB — GLUCOSE, CAPILLARY: Glucose-Capillary: 108 mg/dL — ABNORMAL HIGH (ref 70–99)

## 2022-08-04 LAB — MAGNESIUM: Magnesium: 2.3 mg/dL (ref 1.7–2.4)

## 2022-08-04 LAB — PHOSPHORUS: Phosphorus: 3.1 mg/dL (ref 2.5–4.6)

## 2022-08-04 LAB — CULTURE, BLOOD (ROUTINE X 2)

## 2022-08-04 MED ORDER — FLUOXETINE HCL 20 MG PO CAPS
60.0000 mg | ORAL_CAPSULE | Freq: Every day | ORAL | Status: DC
  Administered 2022-08-05 – 2022-08-06 (×2): 60 mg via ORAL
  Filled 2022-08-04 (×2): qty 3

## 2022-08-04 MED ORDER — FOLIC ACID 1 MG PO TABS
1.0000 mg | ORAL_TABLET | Freq: Every day | ORAL | Status: DC
  Administered 2022-08-04 – 2022-08-06 (×3): 1 mg via ORAL
  Filled 2022-08-04 (×3): qty 1

## 2022-08-04 MED ORDER — PANTOPRAZOLE SODIUM 40 MG PO TBEC
40.0000 mg | DELAYED_RELEASE_TABLET | Freq: Two times a day (BID) | ORAL | Status: DC
  Administered 2022-08-04 – 2022-08-06 (×4): 40 mg via ORAL
  Filled 2022-08-04 (×4): qty 1

## 2022-08-04 NOTE — TOC PASRR Note (Signed)
RE: Mark [name] Date of Birth: [birth year] Date: 08/04/2022     To Whom It May Concern:   Please be advised that the above-named patient will require a short-term nursing home stay - anticipated 30 days or less for rehabilitation and strengthening.  The plan is for return home

## 2022-08-04 NOTE — TOC Initial Note (Signed)
Transition of Care (TOC) - Initial/Assessment Note    Patient Details  Name: Mark [name]. MRN: [id number] Date of Birth: 07/26/1940  Transition of Care [facility]) CM/SW Contact:    Allena Katz, LCSW Phone Number: 08/04/2022, 2:43 PM  Clinical Narrative:    CSW spoke with wife regarding Rehab. 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 Pt lives at home with his wife and uses a RW at baseline. Pt has had HH arranged in past and would like to continue that at discharge from SNF. Referral sent to [facility].                      Patient Goals and CMS Choice            Expected Discharge Plan and Services                                              Prior Living Arrangements/Services                       Activities of Daily Living Home Assistive Devices/Equipment: Eyeglasses, Hearing aid, Dan Humphreys (specify type), Wheelchair ADL Screening (condition at time of admission) Patient's cognitive ability adequate to safely complete daily activities?: Yes Is the patient deaf or have difficulty hearing?: Yes Does the patient have difficulty seeing, even when wearing glasses/contacts?: No Does the patient have difficulty concentrating, remembering, or making decisions?: No Patient able to express need for assistance with ADLs?: Yes Does the patient have difficulty dressing or bathing?: No Independently performs ADLs?: Yes (appropriate for developmental age) Does the patient have difficulty walking or climbing stairs?: No Weakness of Legs: Both Weakness of Arms/Hands: None  Permission Sought/Granted                  Emotional Assessment              Admission diagnosis:  Fall [W19.XXXA] Hypoxic [R09.02] Acute cystitis without hematuria [N30.00] Injury of head, subsequent encounter [S09.90XD] Fall, initial encounter [W19.XXXA] Patient Active Problem List   Diagnosis Date  Noted   Acute respiratory failure with hypoxia (HCC) 08/02/2022   UTI (urinary tract infection) 08/02/2022   Psychosis (HCC) 01/22/2022   At risk for prolonged QT interval syndrome 01/22/2022   MCI (mild cognitive impairment) 07/21/2021   COVID-19 10/14/2020   MDD (major depressive disorder), recurrent, in full remission (HCC) 07/12/2020   MDD (major depressive disorder), recurrent episode, mild (HCC) 11/04/2018   Insomnia due to medical condition 11/04/2018   Fall 03/02/2018   Pleural effusion    Weakness 02/22/2018   Nausea 02/21/2018   Near syncope 10/24/2017   Orthostatic hypotension 10/24/2017   History of pulmonary embolus (PE) 08/12/2017   PAF (paroxysmal atrial fibrillation) (HCC) 08/12/2017   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   Abnormal cardiovascular function study 01/07/2017   Lightheadedness 12/23/2016   Lower extremity numbness 12/23/2016   Orthostatic dizziness 12/23/2016   Weakness of both lower extremities 12/23/2016   Arthritis 11/17/2016   SOB (shortness of breath) 11/16/2016   DDD (degenerative disc disease), lumbar 07/01/2016   Lumbosacral spondylosis without myelopathy 01/07/2016   Chronic bilateral low back pain without sciatica 10/10/2015   Primary osteoarthritis of right shoulder 06/25/2015   H/O adenomatous  polyp of colon 07/20/2014   Chronic anticoagulation 10/04/2012   Anxiety 09/11/2011   Cyst of left kidney 09/11/2011   GERD (gastroesophageal reflux disease) 09/11/2011   Heart murmur 09/11/2011   Hyperlipidemia 09/11/2011   Neuropathy 09/11/2011   Pulmonary embolus (HCC) 09/11/2011   Back pain, chronic 09/11/2011   PCP:  Barbette Reichmann, MD Pharmacy:   [facility] - [place], [place] - [address] [place] [address] Phone: [phone] Fax: [phone]     Social Determinants of Health (SDOH) Social History: SDOH Screenings   Food Insecurity: No Food Insecurity (08/03/2022)   Housing: Low Risk  (08/03/2022)  Transportation Needs: No Transportation Needs (08/03/2022)  Utilities: Not At Risk (08/03/2022)  Depression (PHQ2-9): Low Risk  (07/30/2022)  Financial Resource Strain: Low Risk  (04/20/2018)  Physical Activity: Insufficiently Active (04/20/2018)  Social Connections: Unknown (04/20/2018)  Stress: No Stress Concern Present (04/20/2018)  Tobacco Use: Medium Risk (08/01/2022)   SDOH Interventions:     Readmission Risk Interventions     No data to display

## 2022-08-04 NOTE — Progress Notes (Signed)
Physical Therapy Treatment Patient Details Name: Mark [name]. MRN: [id number] DOB: 1940-03-27 Today's Date: 08/04/2022   History of Present Illness Pt is an 82 y/o M admitted on 08/02/22 after presenting with c/o falling from his bed to the ground & hitting his head & back. Pt notes multiple falls in the past 3-4 days & has been taking medication for UTI. Pt is being treated for fall & UTI. PMH: anxiety, a-fib, chronic back pain, dementia, GERD, heart murmur, HLD, mitral valve prolapse, neuropathy, OA, PE, spinal stenosis, vertigo    PT Comments    Pt received semi reclined in bed with spouse in room agreeable to PT. Pt denies back pain at rest but has pain with mobility. Pt required multimodal cuing  for all activity but able to follow commands successfully. Required re-education for log roll technique needing minA at torso and HHA to attain sitting. Pt able to stand at EOB with it elevated with max cuing for hand placement on RW standing at minguard and SPT to recliner with RW at [facility] on RW for sequencing and poor eccentric control into recliner. Pt reports more pain than dizziness limiting his OOB tolerance. Reviewed importance of OOB attempts and upright sitting for BP regulation. Spouse educated as well as pt has dementia at baseline. Pt with all needs in reach with d/c recs remaining appropriate.    Recommendations for follow up therapy are one component of a multi-disciplinary discharge planning process, led by the attending physician.  Recommendations may be updated based on patient status, additional functional criteria and insurance authorization.  Follow Up Recommendations  Can patient physically be transported by private vehicle: No    Assistance Recommended at Discharge Frequent or constant Supervision/Assistance  Patient can return home with the following Two people to help with walking and/or transfers;Two people to help with bathing/dressing/bathroom;Direct supervision/assist  for medications management;Help with stairs or ramp for entrance;Assistance with feeding;Assist for transportation;Assistance with cooking/housework;Direct supervision/assist for financial management   Equipment Recommendations  Other (comment) (TBD by next venue of care)    Recommendations for Other Services       Precautions / Restrictions Precautions Precautions: Fall Restrictions Weight Bearing Restrictions: No     Mobility  Bed Mobility Overal bed mobility: Needs Assistance Bed Mobility: Supine to Sit     Supine to sit: Min assist     General bed mobility comments: multimodal cuing for log roll technique Patient Response: Cooperative  Transfers Overall transfer level: Needs assistance Equipment used: Rolling walker (2 wheels) Transfers: Sit to/from Stand, Bed to chair/wheelchair/BSC Sit to Stand: Min guard, From elevated surface   Step pivot transfers: Min assist       General transfer comment: required EOB elevated to ensure no physical assist. MinA on RW for sequencing    Ambulation/Gait                   Stairs             Wheelchair Mobility    Modified Rankin (Stroke Patients Only)       Balance Overall balance assessment: Needs assistance Sitting-balance support: Feet supported, Bilateral upper extremity supported Sitting balance-Leahy Scale: Fair       Standing balance-Leahy Scale: Poor Standing balance comment: heavy reliance on RW for support                            Cognition Arousal/Alertness: Awake/alert Behavior During Therapy: [facility] for tasks  assessed/performed Overall Cognitive Status: History of cognitive impairments - at baseline                                 General Comments: pt HoH but able to follow multimodal commands fully.        Exercises Other Exercises Other Exercises: reviewed upright sitting for BP normalization to pt and spouse    General Comments         Pertinent Vitals/Pain Pain Assessment Pain Assessment: Faces Faces Pain Scale: Hurts little more Pain Location: back pain Pain Descriptors / Indicators: Discomfort Pain Intervention(s): Limited activity within patient's tolerance, Monitored during session, Repositioned    Home Living                          Prior Function            PT Goals (current goals can now be found in the care plan section) Acute Rehab PT Goals Patient Stated Goal: get better PT Goal Formulation: With patient/family Time For Goal Achievement: 08/17/22 Potential to Achieve Goals: Fair Progress towards PT goals: Progressing toward goals    Frequency    Min 3X/week      PT Plan Current plan remains appropriate    Co-evaluation              AM-PAC PT "6 Clicks" Mobility   Outcome Measure  Help needed turning from your back to your side while in a flat bed without using bedrails?: A Little Help needed moving from lying on your back to sitting on the side of a flat bed without using bedrails?: A Little Help needed moving to and from a bed to a chair (including a wheelchair)?: A Little Help needed standing up from a chair using your arms (e.g., wheelchair or bedside chair)?: A Lot Help needed to walk in hospital room?: Total Help needed climbing 3-5 steps with a railing? : Total 6 Click Score: 13    End of Session Equipment Utilized During Treatment: Gait belt Activity Tolerance: Patient tolerated treatment well Patient left: in chair;with call bell/phone within reach;with chair alarm set;with family/visitor present Nurse Communication: Mobility status PT Visit Diagnosis: Unsteadiness on feet (R26.81);Other abnormalities of gait and mobility (R26.89);Difficulty in walking, not elsewhere classified (R26.2);Muscle weakness (generalized) (M62.81);History of falling (Z91.81)     Time: 1610-9604 PT Time Calculation (min) (ACUTE ONLY): 15 min  Charges:  $Therapeutic Activity:  8-22 mins                     Delphia Grates. Fairly IV, PT, DPT Physical Therapist- [place]  [facility]  08/04/2022, 1:01 PM

## 2022-08-04 NOTE — Progress Notes (Signed)
PHARMACIST - PHYSICIAN COMMUNICATION  CONCERNING: IV to Oral Route Change Policy  RECOMMENDATION: This patient is receiving pantoprazole by the intravenous route.  Based on criteria approved by the Pharmacy and Therapeutics Committee, the intravenous medication(s) is/are being converted to the equivalent oral dose form(s).   DESCRIPTION: These criteria include: The patient is eating (either orally or via tube) and/or has been taking other orally administered medications for a least 24 hours The patient has no evidence of active gastrointestinal bleeding or impaired GI absorption (gastrectomy, short bowel, patient on TNA or NPO).  If you have questions about this conversion, please contact the Pharmacy Department  []   [phone] )  [facility] [x]   [phone] )  [facility] []   [phone] )  [facility] []   [phone] )  [facility] []   [phone] )  [facility]   Will M. Dareen Piano, PharmD PGY-1 Pharmacy Resident 08/04/2022 11:29 AM

## 2022-08-04 NOTE — NC FL2 (Signed)
[place] MEDICAID FL2 LEVEL OF CARE FORM     IDENTIFICATION  Patient Name: Mark [name]. Birthdate: [birth year] Sex: male Admission Date (Current Location): 08/02/2022  [facility] and [place] Number:  Chiropodist and Address:  [facility], [address], [place], [place] [address]      Provider Number: [id number]  Attending Physician Name and Address:  Gillis Santa, MD  Relative Name and Phone Number:  Rozelle, Yuill (Spouse) [phone] ([address] [place] [address])    Current Level of Care: Hospital Recommended Level of Care: Skilled Nursing Facility Prior Approval Number:    Date Approved/Denied:   PASRR Number: pending  Discharge Plan: SNF    Current Diagnoses: Patient Active Problem List   Diagnosis Date Noted   Acute respiratory failure with hypoxia (HCC) 08/02/2022   UTI (urinary tract infection) 08/02/2022   Psychosis (HCC) 01/22/2022   At risk for prolonged QT interval syndrome 01/22/2022   MCI (mild cognitive impairment) 07/21/2021   COVID-19 10/14/2020   MDD (major depressive disorder), recurrent, in full remission (HCC) 07/12/2020   MDD (major depressive disorder), recurrent episode, mild (HCC) 11/04/2018   Insomnia due to medical condition 11/04/2018   Fall 03/02/2018   Pleural effusion    Weakness 02/22/2018   Nausea 02/21/2018   Near syncope 10/24/2017   Orthostatic hypotension 10/24/2017   History of pulmonary embolus (PE) 08/12/2017   PAF (paroxysmal atrial fibrillation) (HCC) 08/12/2017   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   Abnormal cardiovascular function study 01/07/2017   Lightheadedness 12/23/2016   Lower extremity numbness 12/23/2016   Orthostatic dizziness 12/23/2016   Weakness of both lower extremities 12/23/2016   Arthritis 11/17/2016   SOB (shortness of breath) 11/16/2016   DDD (degenerative disc disease), lumbar 07/01/2016   Lumbosacral spondylosis without  myelopathy 01/07/2016   Chronic bilateral low back pain without sciatica 10/10/2015   Primary osteoarthritis of right shoulder 06/25/2015   H/O adenomatous polyp of colon 07/20/2014   Chronic anticoagulation 10/04/2012   Anxiety 09/11/2011   Cyst of left kidney 09/11/2011   GERD (gastroesophageal reflux disease) 09/11/2011   Heart murmur 09/11/2011   Hyperlipidemia 09/11/2011   Neuropathy 09/11/2011   Pulmonary embolus (HCC) 09/11/2011   Back pain, chronic 09/11/2011    Orientation RESPIRATION BLADDER Height & Weight     Self, Time, Situation, Place  O2 Continent Weight: 225 lb 1.4 oz (102.1 kg) Height:  6\' 4"  (193 cm)  BEHAVIORAL SYMPTOMS/MOOD NEUROLOGICAL BOWEL NUTRITION STATUS      Continent Diet  AMBULATORY STATUS COMMUNICATION OF NEEDS Skin     Verbally Normal                       Personal Care Assistance Level of Assistance              Functional Limitations Info  Sight, Hearing, Speech Sight Info: Impaired Hearing Info: Adequate Speech Info: Adequate    SPECIAL CARE FACTORS FREQUENCY  PT (By licensed PT), OT (By licensed OT)     PT Frequency: 5 times a week OT Frequency: 5 times a wek            Contractures Contractures Info: Not present    Additional Factors Info  Code Status, Allergies Code Status Info: FULL Allergies Info: Aricept (Donepezil)  Ciprofloxacin  Erythromycin  Keflex (Cephalexin)  Penicillins           Current Medications (08/04/2022):  This is the current hospital  active medication list Current Facility-Administered Medications  Medication Dose Route Frequency Provider Last Rate Last Admin   acetaminophen (TYLENOL) tablet 650 mg  650 mg Oral Q6H PRN Gertha Calkin, MD       Or   acetaminophen (TYLENOL) suppository 650 mg  650 mg Rectal Q6H PRN Gertha Calkin, MD       apixaban Everlene Balls) tablet 2.5 mg  2.5 mg Oral Q12H Irena Cords V, MD   2.5 mg at 08/04/22 0950   finasteride (PROSCAR) tablet 5 mg  5 mg Oral Daily Irena Cords V, MD   5 mg at 08/04/22 0950   [START ON 08/05/2022] FLUoxetine (PROZAC) capsule 60 mg  60 mg Oral Daily Murriel Hopper M, RPH       folic acid (FOLVITE) tablet 1 mg  1 mg Oral Daily Gillis Santa, MD   1 mg at 08/04/22 0950   memantine (NAMENDA) tablet 5 mg  5 mg Oral Daily Irena Cords V, MD   5 mg at 08/04/22 0950   morphine (PF) 2 MG/ML injection 2 mg  2 mg Intravenous Q4H PRN Gertha Calkin, MD       pantoprazole (PROTONIX) EC tablet 40 mg  40 mg Oral BID Orson Aloe, [facility]       sodium chloride flush (NS) 0.9 % injection 3 mL  3 mL Intravenous Q12H Irena Cords V, MD   3 mL at 08/04/22 0946   tamsulosin (FLOMAX) capsule 0.4 mg  0.4 mg Oral Daily Gertha Calkin, MD   0.4 mg at 08/04/22 4098     Discharge Medications: Please see discharge summary for a list of discharge medications.  Relevant Imaging Results:  Relevant Lab Results:   Additional Information SS# [id number]  Allena Katz, LCSW

## 2022-08-04 NOTE — Progress Notes (Signed)
[facility] Progress Note  Patient: Mark [name].    JXB:[id number]  DOA: 08/02/2022     Date of Service: the patient was seen and examined on 08/04/2022  Chief Complaint  Patient presents with   Fall   Brief hospital course: Printis Eckenrod. is an 82 y.o. male with PMH of factor V Leyden deficiency, pulmonary embolism on low-dose Eliquis, ataxia, neuromuscular disorder, mitral valve prolapse, dementia, neuropathy, patient denies history of A-fib, thyroid nodule, GERD, chronic back pain, as reviewed from EMR, presented at [facility] ED with complaining of recurrent falls.  As per patient he has fallen multiple times in the past 3 to 4 days and has been so weak.  Patient had UTI recently seen by urology and received Bactrim.  Patient still has symptoms of urinary frequency and dysuria.  ED workup, UA negative, CT head negative for any acute findings.  CT neck negative for any acute. - X-ray lumbar spine Mild compression deformity of the superior endplate of L2 which is new from prior. This is age indeterminate. Correlate with point tenderness.  Assessment and Plan:  # Orthostatic hypotension Patient presented with recurrent falls, positive for orthostatic hypotension Continue IV fluid for gentle hydration Monitor vital signs TTE shows LVEF 55 to 60%, no any other significant findings Patient was advised to move slowly and continue fall precautions PT and OT consulted TOC consulted for possible placement  # History of neuromuscular disorder, ataxia and peripheral neuropathy Currently patient is not on any medication,  patient is following neurology as an outpatient.  # L2 fracture status post fall X-ray shows mild compression deformity superior endplate of L2 Chane continue as needed medication for pain control CT scan lumbar spine and CT pelvis: Superior endplate compression fracture at L2, with approximately 10% vertebral body height loss, which is favored to be acute. 2. Multilevel  degenerative changes, with mild spinal canal stenosis at L2-L3 and L3-L4 and mild-to-moderate spinal canal stenosis at L4-L5. Mild neural foraminal narrowing on the right at L2-L3 and bilaterally at L4-L5 and L5-S1.   # Recurrent UTI, patient was on Bactrim started on 5/9, patient was following urology as an outpatient. 5/12 patient received 1 dose of fosfomycin 3 g  # History of factor V Leiden deficiency and pulmonary embolism Patient is on Eliquis 2.5 mg p.o. twice daily, patient follows hematology as an outpatient It seems patient is on low-dose, recommended to follow-up with hematology   #Anxiety and depression, continued Prozac home dose #Dementia, continued Namenda home dose #GERD, continue PPI # Abnormal thyroid gland, incidental finding lobulated heterogeneous left lobe of the thyroid with recommendations for ultrasound.  # BPH, continued Flomax and finasteride Follow bladder scan to rule out urinary retention  Body mass index is 27.4 kg/m.  Interventions:       Diet: Heart healthy diet DVT Prophylaxis: Therapeutic Anticoagulation with Eliquis    Advance goals of care discussion: Full code  Family Communication: family was present at bedside, at the time of interview.  The pt provided permission to discuss medical plan with the family. Opportunity was given to ask question and all questions were answered satisfactorily.   Disposition:  Pt is from Home, admitted with frequent falls, orthostatic hypotension, L2 fracture, still has back pain, difficulty walking, which precludes a safe discharge. Discharge to home with home health services versus SNF, TBD after PT/OT eval, when clinically stable, may need 1-2 more days to stay in the hospital..  Subjective: No significant events overnight, patient  is having frequency of urination, RN was advised to do bladder scan to rule out urinary retention.  Patient still having orthostasis and symptomatic.  No pain at rest but it hurts  5-10 on movement in the lower back and right lower extremity.  Still patient having significant difficulty walking, may need SNF placement, awaiting for PT and OT evaluation.   Physical Exam: General: NAD, lying comfortably Appear in no distress, affect appropriate Eyes: PERRLA ENT: Oral Mucosa Clear, moist, hard of hearing Neck: no JVD,  Cardiovascular: S1 and S2 Present, no Murmur,  Respiratory: good respiratory effort, Bilateral Air entry equal and Decreased, no Crackles, no wheezes Abdomen: Bowel Sound present, Soft and no tenderness,  Skin: no rashes Extremities: no Pedal edema, no calf tenderness Neurologic: without any new focal findings Gait not checked due to patient safety concerns  Vitals:   08/03/22 2056 08/04/22 0033 08/04/22 0520 08/04/22 0804  BP: (!) 140/63 130/73 131/74 117/74  Pulse: 93 87 88 87  Resp: 18 18 20 18   Temp: 98.1 F (36.7 C) 98.9 F (37.2 C) 97.9 F (36.6 C) 97.7 F (36.5 C)  TempSrc:      SpO2: 93% 95% 91% 93%  Weight:      Height:        Intake/Output Summary (Last 24 hours) at 08/04/2022 1324 Last data filed at 08/04/2022 1028 Gross per 24 hour  Intake 600 ml  Output 250 ml  Net 350 ml   Filed Weights   08/02/22 1802 08/03/22 0312  Weight: 110 kg 102.1 kg    Data Reviewed: I have personally reviewed and interpreted daily labs, tele strips, imagings as discussed above. I reviewed all nursing notes, pharmacy notes, vitals, pertinent old records I have discussed plan of care as described above with RN and patient/family.  CBC: Recent Labs  Lab 08/02/22 1808 08/03/22 0422 08/04/22 0514  WBC 10.7* 9.0 8.8  HGB 15.0 13.3 13.2  HCT 44.7 40.5 40.2  MCV 92.2 94.2 92.8  PLT 225 200 188   Basic Metabolic Panel: Recent Labs  Lab 08/02/22 1808 08/03/22 0422 08/03/22 0654 08/04/22 0514  NA 137 138  --  135  K 4.2 3.9  --  3.8  CL 102 105  --  102  CO2 22 25  --  24  GLUCOSE 114* 87  --  96  BUN 19 19  --  18  CREATININE 1.12  1.02  --  0.90  CALCIUM 9.2 8.9  --  8.4*  MG  --   --  2.2 2.3  PHOS  --   --  3.3 3.1    Studies: CT PELVIS WO CONTRAST  Result Date: 08/03/2022 CLINICAL DATA:  Pelvic fracture Rule out pelvic and hip fracture EXAM: CT PELVIS WITHOUT CONTRAST TECHNIQUE: Multidetector CT imaging of the pelvis was performed following the standard protocol without intravenous contrast. RADIATION DOSE REDUCTION: This exam was performed according to the departmental dose-optimization program which includes automated exposure control, adjustment of the mA and/or kV according to patient size and/or use of iterative reconstruction technique. COMPARISON:  CT pelvis 01/20/2022 FINDINGS: Urinary Tract:  No abnormality visualized. Bowel: Colonic diverticulosis. Unremarkable visualized pelvic bowel loops. Status post appendectomy. Vascular/Lymphatic: No pathologically enlarged lymph nodes. No significant vascular abnormality seen. Reproductive:  No mass or other significant abnormality Other: Atherosclerotic plaque. No intraperitoneal free fluid. No intraperitoneal free gas. No organized fluid collection. Musculoskeletal: No abdominal wall hernia or abnormality. No suspicious lytic or blastic osseous lesions. No acute displaced  fracture. No pelvic diastasis. No acute displaced fracture or dislocation of either hips. Degenerative changes of the spine. Intervertebral disc space vacuum phenomenon. IMPRESSION: Negative for acute traumatic injury. Electronically Signed   By: Tish Frederickson M.D.   On: 08/03/2022 21:10   CT LUMBAR SPINE WO CONTRAST  Result Date: 08/03/2022 CLINICAL DATA:  L2 fracture on x-ray, low back pain EXAM: CT LUMBAR SPINE WITHOUT CONTRAST TECHNIQUE: Multidetector CT imaging of the lumbar spine was performed without intravenous contrast administration. Multiplanar CT image reconstructions were also generated. RADIATION DOSE REDUCTION: This exam was performed according to the departmental dose-optimization program  which includes automated exposure control, adjustment of the mA and/or kV according to patient size and/or use of iterative reconstruction technique. COMPARISON:  01/20/2022 CT chest abdomen pelvis, CT lumbar spine 04/16/2016. FINDINGS: Segmentation: 5 lumbar type vertebral bodies. Alignment: Trace retrolisthesis of L1 on L2. Trace anterolisthesis of L4 on L5 and L5 on S1. Levocurvature. Vertebrae: Superior endplate deformity at L2, with approximately 10% vertebral body height loss, which is favored to be acute. The fracture does not appear to extend through the posterior cortex or involve the posterior elements. Vertebral body heights are otherwise preserved. Paraspinal and other soft tissues: No lymphadenopathy. Aortic atherosclerosis. Atrophy of the inferior paraspinous muscles. Disc levels: T12-L1: No significant disc bulge. Mild facet arthropathy. No spinal canal stenosis or neural foraminal narrowing. L1-L2: Trace retrolisthesis and minimal disc bulge. Mild facet arthropathy. No spinal canal stenosis or neural foraminal narrowing. L2-L3: Mild disc bulge. Moderate facet arthropathy. Ligamentum flavum hypertrophy. Mild spinal canal stenosis. Narrowing of the lateral recesses. Mild right neural foraminal narrowing. L3-L4: No significant disc bulge. Severe facet arthropathy. Ligamentum flavum hypertrophy. Mild spinal canal stenosis. No neural foraminal narrowing. L4-L5: Trace anterolisthesis and mild disc bulge. Severe facet arthropathy. Ligamentum flavum hypertrophy. Mild-to-moderate spinal canal stenosis. Narrowing of the lateral recesses. Mild bilateral neural foraminal narrowing. L5-S1: Trace anterolisthesis and mild disc bulge. Status post laminectomy. Severe facet arthropathy. No spinal canal stenosis. Mild bilateral neural foraminal narrowing. IMPRESSION: 1. Superior endplate compression fracture at L2, with approximately 10% vertebral body height loss, which is favored to be acute. 2. Multilevel  degenerative changes, with mild spinal canal stenosis at L2-L3 and L3-L4 and mild-to-moderate spinal canal stenosis at L4-L5. Mild neural foraminal narrowing on the right at L2-L3 and bilaterally at L4-L5 and L5-S1. Electronically Signed   By: Wiliam Ke M.D.   On: 08/03/2022 19:57   ECHOCARDIOGRAM COMPLETE  Result Date: 08/03/2022    ECHOCARDIOGRAM REPORT   Patient Name:   Jayvin Sedlar. Date of Exam: 08/03/2022 Medical Rec #:  [id number]       Height:       76.0 in Accession #:    [id number]      Weight:       225.1 lb Date of Birth:  1940-03-30      BSA:          2.329 m Patient Age:    32 years        BP:           128/66 mmHg Patient Gender: M               HR:           95 bpm. Exam Location:  [facility] Procedure: 2D Echo, Cardiac Doppler, Color Doppler and Intracardiac            Opacification Agent Indications:     Mitral valve insufficiency  History:  Patient has prior history of Echocardiogram examinations, most                  recent 10/24/2017. Arrythmias:Atrial Fibrillation,                  Signs/Symptoms:Murmur, Shortness of Breath and Syncope; Risk                  Factors:Dyslipidemia. Pulmonary embolus.  Sonographer:     Mikki Harbor Referring Phys:  [id number] Gillis Santa Diagnosing Phys: Lorine Bears MD  Sonographer Comments: Technically difficult study due to poor echo windows. Image acquisition challenging due to respiratory motion. IMPRESSIONS  1. Left ventricular ejection fraction, by estimation, is 55 to 60%. The left ventricle has normal function. Left ventricular endocardial border not optimally defined to evaluate regional wall motion. There is mild left ventricular hypertrophy. Left ventricular diastolic parameters were normal.  2. Right ventricular systolic function is normal. The right ventricular size is normal. Tricuspid regurgitation signal is inadequate for assessing PA pressure.  3. Left atrial size was mildly dilated.  4. The mitral valve was not well visualized.  Trivial mitral valve regurgitation. No evidence of mitral stenosis.  5. The aortic valve is normal in structure. Aortic valve regurgitation is not visualized. Aortic valve sclerosis/calcification is present, without any evidence of aortic stenosis. FINDINGS  Left Ventricle: Left ventricular ejection fraction, by estimation, is 55 to 60%. The left ventricle has normal function. Left ventricular endocardial border not optimally defined to evaluate regional wall motion. Definity contrast agent was given IV to delineate the left ventricular endocardial borders. The left ventricular internal cavity size was normal in size. There is mild left ventricular hypertrophy. Left ventricular diastolic parameters were normal. Right Ventricle: The right ventricular size is normal. No increase in right ventricular wall thickness. Right ventricular systolic function is normal. Tricuspid regurgitation signal is inadequate for assessing PA pressure. Left Atrium: Left atrial size was mildly dilated. Right Atrium: Right atrial size was normal in size. Pericardium: There is no evidence of pericardial effusion. Mitral Valve: The mitral valve was not well visualized. Trivial mitral valve regurgitation. No evidence of mitral valve stenosis. MV peak gradient, 4.7 mmHg. The mean mitral valve gradient is 2.0 mmHg. Tricuspid Valve: The tricuspid valve is normal in structure. Tricuspid valve regurgitation is not demonstrated. No evidence of tricuspid stenosis. Aortic Valve: The aortic valve is normal in structure. Aortic valve regurgitation is not visualized. Aortic valve sclerosis/calcification is present, without any evidence of aortic stenosis. Aortic valve mean gradient measures 6.0 mmHg. Aortic valve peak  gradient measures 11.0 mmHg. Aortic valve area, by VTI measures 2.08 cm. Pulmonic Valve: The pulmonic valve was normal in structure. Pulmonic valve regurgitation is not visualized. No evidence of pulmonic stenosis. Aorta: The aortic root  is normal in size and structure. Venous: The inferior vena cava was not well visualized. IAS/Shunts: No atrial level shunt detected by color flow Doppler.  LEFT VENTRICLE PLAX 2D LVIDd:         4.40 cm   Diastology LVIDs:         3.20 cm   LV e' medial:   7.07 cm/s LV PW:         1.10 cm   LV E/e' medial: 11.3 LV IVS:        1.20 cm LVOT diam:     2.10 cm LV SV:         63 LV SV Index:   27 LVOT Area:  3.46 cm  LEFT ATRIUM           Index LA diam:      3.60 cm 1.55 cm/m LA Vol (A4C): 55.6 ml 23.87 ml/m  AORTIC VALVE AV Area (Vmax):    2.26 cm AV Area (Vmean):   2.00 cm AV Area (VTI):     2.08 cm AV Vmax:           165.50 cm/s AV Vmean:          113.500 cm/s AV VTI:            0.302 m AV Peak Grad:      11.0 mmHg AV Mean Grad:      6.0 mmHg LVOT Vmax:         108.00 cm/s LVOT Vmean:        65.600 cm/s LVOT VTI:          0.181 m LVOT/AV VTI ratio: 0.60  AORTA Ao Root diam: 3.60 cm MITRAL VALVE MV Area (PHT): 4.31 cm    SHUNTS MV Area VTI:   2.75 cm    Systemic VTI:  0.18 m MV Peak grad:  4.7 mmHg    Systemic Diam: 2.10 cm MV Mean grad:  2.0 mmHg MV Vmax:       1.08 m/s MV Vmean:      67.3 cm/s MV Decel Time: 176 msec MV E velocity: 79.70 cm/s MV A velocity: 92.20 cm/s MV E/A ratio:  0.86 Lorine Bears MD Electronically signed by Lorine Bears MD Signature Date/Time: 08/03/2022/5:39:31 PM    Final     Scheduled Meds:  apixaban  2.5 mg Oral Q12H   finasteride  5 mg Oral Daily   [START ON 08/05/2022] FLUoxetine  60 mg Oral Daily   folic acid  1 mg Oral Daily   memantine  5 mg Oral Daily   pantoprazole  40 mg Oral BID   sodium chloride flush  3 mL Intravenous Q12H   tamsulosin  0.4 mg Oral Daily   Continuous Infusions:   PRN Meds: acetaminophen **OR** acetaminophen, morphine injection  Time spent: 35 minutes  Author: Gillis Santa. MD [facility] 08/04/2022 1:24 PM  To reach On-call, see care teams to locate the attending and reach out to them via [email or web address]. If 7PM-7AM, please  contact night-coverage If you still have difficulty reaching the attending provider, please page the [facility] (Director on Call) for [facility] on amion for assistance.

## 2022-08-05 DIAGNOSIS — W19XXXA Unspecified fall, initial encounter: Secondary | ICD-10-CM | POA: Diagnosis not present

## 2022-08-05 DIAGNOSIS — I951 Orthostatic hypotension: Secondary | ICD-10-CM | POA: Diagnosis not present

## 2022-08-05 DIAGNOSIS — F419 Anxiety disorder, unspecified: Secondary | ICD-10-CM | POA: Diagnosis not present

## 2022-08-05 LAB — CULTURE, BLOOD (ROUTINE X 2)

## 2022-08-05 MED ORDER — NITROFURANTOIN MONOHYD MACRO 100 MG PO CAPS
100.0000 mg | ORAL_CAPSULE | Freq: Two times a day (BID) | ORAL | Status: DC
  Administered 2022-08-05 – 2022-08-06 (×3): 100 mg via ORAL
  Filled 2022-08-05 (×3): qty 1

## 2022-08-05 MED ORDER — OXYCODONE HCL 5 MG PO TABS: 5.0000 mg | ORAL_TABLET | Freq: Four times a day (QID) | ORAL | Status: DC | PRN

## 2022-08-05 NOTE — TOC Progression Note (Signed)
Transition of Care (TOC) - Progression Note    Patient Details  Name: Mark [name]. MRN: [id number] Date of Birth: [birth year]  Transition of Care [facility]) CM/SW Contact  Allena Katz, LCSW Phone Number: 08/05/2022, 10:05 AM  Clinical Narrative:   [facility] has accepted and is able to take pt tomorrow. CSW will inform family.          Expected Discharge Plan and Services                                               Social Determinants of Health (SDOH) Interventions SDOH Screenings   Food Insecurity: No Food Insecurity (08/03/2022)  Housing: Low Risk  (08/03/2022)  Transportation Needs: No Transportation Needs (08/03/2022)  Utilities: Not At Risk (08/03/2022)  Depression (PHQ2-9): Low Risk  (07/30/2022)  Financial Resource Strain: Low Risk  (04/20/2018)  Physical Activity: Insufficiently Active (04/20/2018)  Social Connections: Unknown (04/20/2018)  Stress: No Stress Concern Present (04/20/2018)  Tobacco Use: Medium Risk (08/01/2022)    Readmission Risk Interventions     No data to display

## 2022-08-05 NOTE — Progress Notes (Signed)
[facility] Progress Note  Patient: Mark [name].    QIO:[id number]  DOA: 08/02/2022     Date of Service: the patient was seen and examined on 08/05/2022  Chief Complaint  Patient presents with   Fall   Brief hospital course: Mark [name]. is an 82 y.o. male with PMH of factor V Leyden deficiency, pulmonary embolism on low-dose Eliquis, ataxia, neuromuscular disorder, mitral valve prolapse, dementia, neuropathy, patient denies history of A-fib, thyroid nodule, GERD, chronic back pain, as reviewed from EMR, presented at [facility] ED with complaining of recurrent falls.  As per patient he has fallen multiple times in the past 3 to 4 days and has been so weak.  Patient had UTI recently seen by urology and received Bactrim.  Patient still has symptoms of urinary frequency and dysuria.  ED workup, UA negative, CT head negative for any acute findings.  CT neck negative for any acute. - X-ray lumbar spine Mild compression deformity of the superior endplate of L2 which is new from prior. This is age indeterminate. Correlate with point tenderness.  Assessment and Plan:  # Orthostatic hypotension Patient presented with recurrent falls, positive for orthostatic hypotension S/p IV fluid for gentle hydration Monitor vital signs TTE shows LVEF 55 to 60%, no any other significant findings Patient was advised to move slowly and continue fall precautions PT and OT consulted, recommended SNF placement [facility] consulted for possible placement  # History of neuromuscular disorder, ataxia and peripheral neuropathy Currently patient is not on any medication,  patient is following neurology as an outpatient.  # L2 fracture status post fall X-ray shows mild compression deformity superior endplate of L2 Chane continue as needed medication for pain control CT scan lumbar spine and CT pelvis: Superior endplate compression fracture at L2, with approximately 10% vertebral body height loss, which is favored to be  acute. 2. Multilevel degenerative changes, with mild spinal canal stenosis at L2-L3 and L3-L4 and mild-to-moderate spinal canal stenosis at L4-L5. Mild neural foraminal narrowing on the right at L2-L3 and bilaterally at L4-L5 and L5-S1.   # Recurrent UTI, patient was on Bactrim started on 5/9, patient was following urology as an outpatient. 5/12 patient received 1 dose of fosfomycin 3 g 5/15 Outside urine culture grew strep epi resistant to oxacillin and Bactrim.  Patient's wife was concerned about antibiotics so started on Macrobid 100 mg p.o. twice daily for 5 days.   # History of factor V Leiden deficiency and pulmonary embolism Patient is on Eliquis 2.5 mg p.o. twice daily, patient follows hematology as an outpatient It seems patient is on low-dose, recommended to follow-up with hematology   # Folic acid deficiency, folate level 5.2, started folic acid 1 mg p.o. daily.  Repeat folate level after 3 to 6 months and follow with PCP. #Anxiety and depression, continued Prozac home dose #Dementia, continued Namenda home dose #GERD, continue PPI # Abnormal thyroid gland, incidental finding lobulated heterogeneous left lobe of the thyroid with recommendations for ultrasound.  # BPH, continued Flomax and finasteride Follow bladder scan to rule out urinary retention  Body mass index is 27.4 kg/m.  Interventions:       Diet: Heart healthy diet DVT Prophylaxis: Therapeutic Anticoagulation with Eliquis    Advance goals of care discussion: Full code  Family Communication: family was present at bedside, at the time of interview.  The pt provided permission to discuss medical plan with the family. Opportunity was given to ask question and all questions were  answered satisfactorily.   Disposition:  Pt is from Home, admitted with frequent falls, orthostatic hypotension, L2 fracture, still has back pain, difficulty walking, which precludes a safe discharge. Discharge to SNF as per PT/OT eval,  clinically stable to discharge.  TOC is following for placement.  Patient got accepted and will be discharged to SNF tomorrow a.m.  Subjective: No significant events overnight, patient was sitting comfortably on the recliner, stated that he still feels dizzy while standing up and moving around.  Denies any other complaints.    Physical Exam: General: NAD, lying comfortably Appear in no distress, affect appropriate Eyes: PERRLA ENT: Oral Mucosa Clear, moist, hard of hearing Neck: no JVD,  Cardiovascular: S1 and S2 Present, no Murmur,  Respiratory: good respiratory effort, Bilateral Air entry equal and Decreased, no Crackles, no wheezes Abdomen: Bowel Sound present, Soft and no tenderness,  Skin: no rashes Extremities: no Pedal edema, no calf tenderness Neurologic: without any new focal findings Gait not checked due to patient safety concerns  Vitals:   08/05/22 0732 08/05/22 0820 08/05/22 0850 08/05/22 1257  BP:  113/70 131/69 (!) 144/76  Pulse:  87 87 87  Resp:  19 18 19   Temp:  98.4 F (36.9 C) 98 F (36.7 C) 98.8 F (37.1 C)  TempSrc:      SpO2:  93% 93% 94%  Weight: 103.3 kg     Height:        Intake/Output Summary (Last 24 hours) at 08/05/2022 1300 Last data filed at 08/05/2022 1034 Gross per 24 hour  Intake 770 ml  Output 250 ml  Net 520 ml   Filed Weights   08/02/22 1802 08/03/22 0312 08/05/22 0732  Weight: 110 kg 102.1 kg 103.3 kg    Data Reviewed: I have personally reviewed and interpreted daily labs, tele strips, imagings as discussed above. I reviewed all nursing notes, pharmacy notes, vitals, pertinent old records I have discussed plan of care as described above with RN and patient/family.  CBC: Recent Labs  Lab 08/02/22 1808 08/03/22 0422 08/04/22 0514  WBC 10.7* 9.0 8.8  HGB 15.0 13.3 13.2  HCT 44.7 40.5 40.2  MCV 92.2 94.2 92.8  PLT 225 200 188   Basic Metabolic Panel: Recent Labs  Lab 08/02/22 1808 08/03/22 0422 08/03/22 0654  08/04/22 0514  NA 137 138  --  135  K 4.2 3.9  --  3.8  CL 102 105  --  102  CO2 22 25  --  24  GLUCOSE 114* 87  --  96  BUN 19 19  --  18  CREATININE 1.12 1.02  --  0.90  CALCIUM 9.2 8.9  --  8.4*  MG  --   --  2.2 2.3  PHOS  --   --  3.3 3.1    Studies: No results found.  Scheduled Meds:  apixaban  2.5 mg Oral Q12H   finasteride  5 mg Oral Daily   FLUoxetine  60 mg Oral Daily   folic acid  1 mg Oral Daily   memantine  5 mg Oral Daily   nitrofurantoin (macrocrystal-monohydrate)  100 mg Oral Q12H   pantoprazole  40 mg Oral BID   sodium chloride flush  3 mL Intravenous Q12H   tamsulosin  0.4 mg Oral Daily   Continuous Infusions:   PRN Meds: acetaminophen **OR** acetaminophen, morphine injection  Time spent: 35 minutes  Author: Gillis Santa. MD [facility] 08/05/2022 1:00 PM  To reach On-call, see care teams to  locate the attending and reach out to them via [email or web address]. If 7PM-7AM, please contact night-coverage If you still have difficulty reaching the attending provider, please page the [facility] (Director on Call) for [facility] on amion for assistance.

## 2022-08-05 NOTE — Progress Notes (Signed)
Occupational Therapy Treatment Patient Details Name: Mark [name]. MRN: [id number] DOB: 12-Jan-1941 Today's Date: 08/05/2022   History of present illness Pt is an 82 y/o M admitted on 08/02/22 after presenting with c/o falling from his bed to the ground & hitting his head & back. Pt notes multiple falls in the past 3-4 days & has been taking medication for UTI. Pt is being treated for fall & UTI. PMH: anxiety, a-fib, chronic back pain, dementia, GERD, heart murmur, HLD, mitral valve prolapse, neuropathy, OA, PE, spinal stenosis, vertigo   OT comments  Patient received supine in bed and agreeable to OT. Spouse present. Tx session targeted improving tolerance for functional mobility in the setting of ADL tasks. Pt required Min A to come to EOB this date. Pt then stood from elevated EOB and completed functional mobility at room level with CGA + RW. Attempted functional mobility to the bedside sink in order for pt to engage in standing grooming tasks, however, pt functionally limited by decreased endurance and generalized weakness. Needed to return to EOB for seated rest break. Pt endorsed low back and B shoulder pain with activity that is "chronic" (RN aware). He required VC for overall safety awareness t/o session. Pt left as received with all needs in reach. Pt is making progress toward goal completion. D/C recommendation remains appropriate. OT will continue to follow acutely.   Orthostatic vitals: BP-supine: 130/76 (91), HR 85 BP-sitting: 122/69 (88), HR 89 Attempted BP in standing, however, pt required seated rest break. BP in sitting after activity: 101/80 (88), HR 82   Recommendations for follow up therapy are one component of a multi-disciplinary discharge planning process, led by the attending physician.  Recommendations may be updated based on patient status, additional functional criteria and insurance authorization.    Assistance Recommended at Discharge Frequent or constant  Supervision/Assistance  Patient can return home with the following  Assistance with cooking/housework;Assist for transportation;Help with stairs or ramp for entrance;Direct supervision/assist for financial management;Direct supervision/assist for medications management;A lot of help with bathing/dressing/bathroom;A lot of help with walking and/or transfers   Equipment Recommendations  Other (comment) (defer to next venue of care)    Recommendations for Other Services      Precautions / Restrictions Precautions Precautions: Fall Restrictions Weight Bearing Restrictions: No       Mobility Bed Mobility Overal bed mobility: Needs Assistance Bed Mobility: Supine to Sit, Sit to Supine     Supine to sit: Min assist, HOB elevated Sit to supine: Supervision   General bed mobility comments: assist for trunk elevation    Transfers Overall transfer level: Needs assistance Equipment used: Rolling walker (2 wheels) Transfers: Sit to/from Stand Sit to Stand: Min guard, From elevated surface                 Balance Overall balance assessment: Needs assistance Sitting-balance support: Feet supported, Bilateral upper extremity supported Sitting balance-Leahy Scale: Fair     Standing balance support: Bilateral upper extremity supported, Reliant on assistive device for balance Standing balance-Leahy Scale: Poor                             ADL either performed or assessed with clinical judgement   ADL Overall ADL's : Needs assistance/impaired                         Toilet Transfer: Min guard;Rolling walker (2 wheels) Statistician Details (  indicate cue type and reason): simulated with STS from EOB         Functional mobility during ADLs: Min guard;Rolling walker (2 wheels);Cueing for safety (~72ft) General ADL Comments: Attempted functional mobility to the bedside sink in order for pt to engage in standing grooming tasks, however, pt functionally  limited by decreased endurance and generalized weakness. Needed to return to EOB for seated rest break.    Extremity/Trunk Assessment Upper Extremity Assessment Upper Extremity Assessment: Generalized weakness   Lower Extremity Assessment Lower Extremity Assessment: Generalized weakness        Vision Patient Visual Report: No change from baseline     Perception     Praxis      Cognition Arousal/Alertness: Awake/alert Behavior During Therapy: WFL for tasks assessed/performed Overall Cognitive Status: History of cognitive impairments - at baseline       General Comments: VC for safety awareness throughout session, slightly impulsive/impatient        Exercises      Shoulder Instructions       General Comments      Pertinent Vitals/ Pain       Pain Assessment Pain Assessment: Faces Pain Location: B shoulders and lower back Pain Descriptors / Indicators: Discomfort, Aching, Sore Pain Intervention(s): Limited activity within patient's tolerance, Monitored during session, Repositioned  Home Living        Prior Functioning/Environment              Frequency  Min 2X/week        Progress Toward Goals  OT Goals(current goals can now be found in the care plan section)  Progress towards OT goals: Progressing toward goals  Acute Rehab OT Goals Patient Stated Goal: return to PLOF OT Goal Formulation: With patient/family Time For Goal Achievement: 08/17/22 Potential to Achieve Goals: Fair  Plan Discharge plan remains appropriate;Frequency remains appropriate    Co-evaluation                 AM-PAC OT "6 Clicks" Daily Activity     Outcome Measure   Help from another person eating meals?: A Little Help from another person taking care of personal grooming?: A Little Help from another person toileting, which includes using toliet, bedpan, or urinal?: A Lot Help from another person bathing (including washing, rinsing, drying)?: A Lot Help from  another person to put on and taking off regular upper body clothing?: A Little Help from another person to put on and taking off regular lower body clothing?: A Lot 6 Click Score: 15    End of Session Equipment Utilized During Treatment: Gait belt;Rolling walker (2 wheels)  OT Visit Diagnosis: Unsteadiness on feet (R26.81);Muscle weakness (generalized) (M62.81);History of falling (Z91.81);Pain   Activity Tolerance Patient tolerated treatment well   Patient Left in bed;with call bell/phone within reach;with bed alarm set;with family/visitor present   Nurse Communication Mobility status        Time: 1610-9604 OT Time Calculation (min): 16 min  Charges: OT General Charges $OT Visit: 1 Visit OT Treatments $Self Care/Home Management : 8-22 mins  [facility] MS, OTR/L ascom [phone]  08/05/22, 6:04 PM

## 2022-08-05 NOTE — Progress Notes (Addendum)
Physical Therapy Treatment Patient Details Name: Mark [name]. MRN: [id number] DOB: 06/03/1940 Today's Date: 08/05/2022   History of Present Illness Pt is an 82 y/o M admitted on 08/02/22 after presenting with c/o falling from his bed to the ground & hitting his head & back. Pt notes multiple falls in the past 3-4 days & has been taking medication for UTI. Pt is being treated for fall & UTI. PMH: anxiety, a-fib, chronic back pain, dementia, GERD, heart murmur, HLD, mitral valve prolapse, neuropathy, OA, PE, spinal stenosis, vertigo    PT Comments    Pt received semi reclined in bed agreeable to PT. Attempts made to reassess orthostatics today but unable to get reading in standing. However from supine to sitting there was a 19 point drop in systolic and elevation of HR indicative of positive orthostatics still. Pt still relying on multimodal cuing for following commands but only relies on supervision and minguard throughout session. Pt is able to progress in gait performing 2x6' bouts around the foot of the bed at minguard level. Pt does rely heavily on VC's throughout session for reduced falls risk (I.e. stay closer to BOS of RW, keep the wheels on the ground with turns) with fair carryover. Pt is able to return to recliner with poor eccentric control with increased WOB. Pt denied dizziness with gait attempts with only temporary dizziness with position changes. Pt reports biggest limiting factor is his shoulders hurting and LBP in standing. Pt with all needs in reach with d/c recs remain appropriate. VSS throughout.    BP supine: 129/68, HR: 88 BPM  BP sitting: 110/70, HR: 99 BPM   Recommendations for follow up therapy are one component of a multi-disciplinary discharge planning process, led by the attending physician.  Recommendations may be updated based on patient status, additional functional criteria and insurance authorization.  Follow Up Recommendations  Can patient physically be  transported by private vehicle: No    Assistance Recommended at Discharge Frequent or constant Supervision/Assistance  Patient can return home with the following Two people to help with walking and/or transfers;Two people to help with bathing/dressing/bathroom;Direct supervision/assist for medications management;Help with stairs or ramp for entrance;Assistance with feeding;Assist for transportation;Assistance with cooking/housework;Direct supervision/assist for financial management   Equipment Recommendations  Other (comment) (TBD by next venue of care)    Recommendations for Other Services       Precautions / Restrictions Precautions Precautions: Fall Restrictions Weight Bearing Restrictions: No     Mobility  Bed Mobility Overal bed mobility: Needs Assistance Bed Mobility: Supine to Sit     Supine to sit: Supervision, HOB elevated       Patient Response: Cooperative  Transfers Overall transfer level: Needs assistance Equipment used: Rolling walker (2 wheels) Transfers: Sit to/from Stand Sit to Stand: Min guard, From elevated surface           General transfer comment: VC's for safe hand placement initially but able to demo good carryover throughout other STS efforts.    Ambulation/Gait Ambulation/Gait assistance: Min guard Gait Distance (Feet): 12 Feet (2x6' with seated rest b/t bouts.) Assistive device: Rolling walker (2 wheels) Gait Pattern/deviations: Step-to pattern, Decreased step length - right, Decreased step length - left, Trunk flexed       General Gait Details: heavy use of BUE's on RW with frequent VC's for turning RW and maintaining BOS inside RW.   Stairs             Wheelchair Mobility    Modified  Rankin (Stroke Patients Only)       Balance Overall balance assessment: Needs assistance Sitting-balance support: Feet supported, Bilateral upper extremity supported Sitting balance-Leahy Scale: Fair     Standing balance support:  Bilateral upper extremity supported, Reliant on assistive device for balance Standing balance-Leahy Scale: Poor Standing balance comment: heavy reliance on RW for support                            Cognition Arousal/Alertness: Awake/alert Behavior During Therapy: WFL for tasks assessed/performed Overall Cognitive Status: History of cognitive impairments - at baseline                                 General Comments: pt HoH but able to follow multimodal commands fully.        Exercises      General Comments        Pertinent Vitals/Pain Pain Assessment Pain Assessment: Faces Faces Pain Scale: Hurts little more Pain Location: B shoulders and lower back in standing Pain Descriptors / Indicators: Discomfort Pain Intervention(s): Limited activity within patient's tolerance, Monitored during session, Repositioned    Home Living                          Prior Function            PT Goals (current goals can now be found in the care plan section) Acute Rehab PT Goals Patient Stated Goal: get better PT Goal Formulation: With patient/family Time For Goal Achievement: 08/17/22 Potential to Achieve Goals: Fair Progress towards PT goals: Progressing toward goals    Frequency    Min 3X/week      PT Plan Current plan remains appropriate    Co-evaluation              AM-PAC PT "6 Clicks" Mobility   Outcome Measure  Help needed turning from your back to your side while in a flat bed without using bedrails?: A Little Help needed moving from lying on your back to sitting on the side of a flat bed without using bedrails?: A Little Help needed moving to and from a bed to a chair (including a wheelchair)?: A Little Help needed standing up from a chair using your arms (e.g., wheelchair or bedside chair)?: A Little Help needed to walk in hospital room?: A Lot Help needed climbing 3-5 steps with a railing? : Total 6 Click Score: 15     End of Session Equipment Utilized During Treatment: Gait belt Activity Tolerance: Patient tolerated treatment well Patient left: in chair;with call bell/phone within reach;with chair alarm set;with family/visitor present Nurse Communication: Mobility status PT Visit Diagnosis: Unsteadiness on feet (R26.81);Other abnormalities of gait and mobility (R26.89);Difficulty in walking, not elsewhere classified (R26.2);Muscle weakness (generalized) (M62.81);History of falling (Z91.81)     Time: 1610-9604 PT Time Calculation (min) (ACUTE ONLY): 26 min  Charges:  $Gait Training: 23-37 mins                    Delphia Grates. Fairly IV, PT, DPT Physical Therapist- [place]  [facility]  08/05/2022, 10:11 AM

## 2022-08-06 DIAGNOSIS — W19XXXA Unspecified fall, initial encounter: Secondary | ICD-10-CM | POA: Diagnosis not present

## 2022-08-06 DIAGNOSIS — F419 Anxiety disorder, unspecified: Secondary | ICD-10-CM | POA: Diagnosis not present

## 2022-08-06 DIAGNOSIS — I951 Orthostatic hypotension: Secondary | ICD-10-CM | POA: Diagnosis not present

## 2022-08-06 LAB — CULTURE, BLOOD (ROUTINE X 2): Special Requests: ADEQUATE

## 2022-08-06 MED ORDER — NITROFURANTOIN MONOHYD MACRO 100 MG PO CAPS: 100.0000 mg | ORAL_CAPSULE | Freq: Two times a day (BID) | ORAL | 0 refills | Status: AC

## 2022-08-06 MED ORDER — ACETAMINOPHEN 325 MG PO TABS: 650.0000 mg | ORAL_TABLET | Freq: Four times a day (QID) | ORAL | Status: DC | PRN

## 2022-08-06 MED ORDER — FOLIC ACID 1 MG PO TABS: 1.0000 mg | ORAL_TABLET | Freq: Every day | ORAL | 1 refills | Status: AC

## 2022-08-06 MED ORDER — PANTOPRAZOLE SODIUM 40 MG PO TBEC: 40.0000 mg | DELAYED_RELEASE_TABLET | Freq: Every day | ORAL | Status: DC

## 2022-08-06 NOTE — Plan of Care (Signed)

## 2022-08-06 NOTE — Discharge Summary (Addendum)
[facility] Discharge Summary   Patient: Mark [name]. ZOX:[id number]  PCP: Barbette Reichmann, MD  Date of admission: 08/02/2022   Date of discharge:  08/06/2022     Discharge Diagnoses:  Principal Problem:   Fall Active Problems:   UTI (urinary tract infection)   Acute respiratory failure with hypoxia (HCC)   Orthostatic hypotension   Anxiety   Chronic anticoagulation   GERD (gastroesophageal reflux disease)   PAF (paroxysmal atrial fibrillation) (HCC)   Pulmonary embolus (HCC)   Admitted From: Home Disposition:  SNF   Recommendations for Outpatient Follow-up:  PCP: In 1 to 2 days, patient should be seen by an MD, monitor orthostatic vital signs Follow with neurology as an outpatient Follow up LABS/TEST: Thyroid sonogram as an outpatient, folic acid level after 3 to 6 months   Contact information for after-discharge care     Destination     [facility] .   Service: Skilled Nursing Contact information: [address] [place] [address] [phone]                    Diet recommendation: Regular diet  Activity: The patient is advised to gradually reintroduce usual activities, as tolerated  Discharge Condition: stable  Code Status: Full code   History of present illness: As per the H and P dictated on admission  Hospital Course:  Mark [name]. is an 82 y.o. male with PMH of factor V Leyden deficiency, pulmonary embolism on low-dose Eliquis, ataxia, neuromuscular disorder, mitral valve prolapse, dementia, neuropathy, patient denies history of A-fib, thyroid nodule, GERD, chronic back pain, as reviewed from EMR, presented at [facility] ED with complaining of recurrent falls.  As per patient he has fallen multiple times in the past 3 to 4 days and has been so weak.  Patient had UTI recently seen by urology and received Bactrim.  Patient still has symptoms of urinary frequency and dysuria.  ED workup, UA negative, CT head  negative for any acute findings.  CT neck negative for any acute. - X-ray lumbar spine Mild compression deformity of the superior endplate of L2 which is new from prior. This is age indeterminate. Correlate with point tenderness.   Assessment and Plan: # Orthostatic hypotension Patient presented with recurrent falls, positive for orthostatic hypotension S/p IV fluid for gentle hydration. Monitor vital signs.  Continue fall precautions, patient was advised to move slow while changing the position to avoid syncope and fall. TTE shows LVEF 55 to 60%, no any other significant findings. PT and OT consulted, recommended SNF placement. TOC consulted, patient received a bed offer today, plan is to discharge him today to SNF.  # History of neuromuscular disorder, ataxia and peripheral neuropathy Currently patient is not on any medication, patient is following neurology as an outpatient. # L2 fracture status post fall X-ray shows mild compression deformity superior endplate of L2 Chane continue as needed medication for pain control CT scan lumbar spine and CT pelvis: Superior endplate compression fracture at L2, with approximately 10% vertebral body height loss, which is favored to be acute. 2. Multilevel degenerative changes, with mild spinal canal stenosis at L2-L3 and L3-L4 and mild-to-moderate spinal canal stenosis at L4-L5. Mild neural foraminal narrowing on the right at L2-L3 and bilaterally at L4-L5 and L5-S1. # Recurrent UTI, patient was on Bactrim started on 5/9, patient was following urology as an outpatient. 5/12 patient received 1 dose of fosfomycin 3 g 5/15 Outside urine culture grew  strep epi resistant to oxacillin and Bactrim.  Patient's wife was concerned about antibiotics so on 08/05/22 started on Macrobid 100 mg p.o. twice daily for 5 days. # History of factor V Leiden deficiency and pulmonary embolism Patient is on Eliquis 2.5 mg p.o. twice daily, patient follows hematology as an  outpatient It seems patient is on low-dose, recommended to follow-up with hematology  # Folic acid deficiency, folate level 5.2, started folic acid 1 mg p.o. daily.  Repeat folate level after 3 to 6 months and follow with PCP. #Anxiety and depression, continued Prozac 60 mg pod home dose #Dementia, continued Namenda home dose #GERD, continue PPI # Abnormal thyroid gland, incidental finding.  lobulated heterogeneous left lobe of the thyroid with recommendations for ultrasound.  Follow with PCP for thyroid sonogram and further management as an outpatient. # BPH, continued Flomax and finasteride. Bladder scan prn to rule out urinary retention  Body mass index is 27.35 kg/m.  Nutrition Interventions:  - Patient was instructed, not to drive, operate heavy machinery, perform activities at heights, swimming or participation in water activities or provide baby sitting services while on Pain, Sleep and Anxiety Medications; until his outpatient Physician has advised to do so again.  - Also recommended to not to take more than prescribed Pain, Sleep and Anxiety Medications.  Patient was seen by physical therapy, who recommended Therapy, SNF placement, which was arranged. On the day of the discharge the patient's vitals were stable, and no other acute medical condition were reported by patient. the patient was felt safe to be discharge at [facility].  Consultants: None Procedures: None  Discharge Exam: General: Appear in no distress, no Rash; Oral Mucosa Clear, moist. Cardiovascular: S1 and S2 Present, no Murmur, Respiratory: normal respiratory effort, Bilateral Air entry present and no Crackles, no wheezes Abdomen: Bowel Sound present, Soft and no tenderness, no hernia Extremities: no Pedal edema, no calf tenderness Neurology: alert and oriented to time, place, and person affect appropriate.  Filed Weights   08/03/22 0312 08/05/22 0732 08/06/22 0500  Weight: 102.1 kg 103.3 kg 101.9 kg   Vitals:    08/06/22 0806 08/06/22 1017  BP: 116/67   Pulse: 96 93  Resp: 18   Temp: 98 F (36.7 C)   SpO2: 90%     DISCHARGE MEDICATION: Allergies as of 08/06/2022       Reactions   Aricept [donepezil]    Hallucination, confusion   Ciprofloxacin Other (See Comments)   Erythromycin Swelling   Keflex [cephalexin] Swelling   Penicillins Swelling   Has patient had a PCN reaction causing immediate rash, facial/tongue/throat swelling, SOB or lightheadedness with hypotension: Yes Has patient had a PCN reaction causing severe rash involving mucus membranes or skin necrosis: No Has patient had a PCN reaction that required hospitalization: Unknown Has patient had a PCN reaction occurring within the last 10 years: No If all of the above answers are "NO", then may proceed with Cephalosporin use.        Medication List     STOP taking these medications    betamethasone dipropionate 0.05 % cream   Clotrimazole 1 % Oint   Cranberry 200 MG Caps   sucralfate 1 g tablet Commonly known as: CARAFATE   sulfamethoxazole-trimethoprim 800-160 MG tablet Commonly known as: BACTRIM DS       TAKE these medications    acetaminophen 325 MG tablet Commonly known as: TYLENOL Take 2 tablets (650 mg total) by mouth every 6 (six) hours as needed for mild  pain, moderate pain, fever or headache (or Fever >/= 101).   B-12 5000 MCG Caps Take 5,000 mg by mouth daily. What changed: how much to take   buPROPion 75 MG tablet Commonly known as: WELLBUTRIN Take 75 mg by mouth daily with breakfast. What changed: Another medication with the same name was removed. Continue taking this medication, and follow the directions you see here.   cholecalciferol 10 MCG (400 UNIT) Tabs tablet Commonly known as: VITAMIN D3 Take 400 Units by mouth daily.   CO Q 10 PO Take 1 Dose by mouth daily.   docusate sodium 100 MG capsule Commonly known as: COLACE Take 300 mg by mouth at bedtime.   Eliquis 2.5 MG Tabs  tablet Generic drug: apixaban Take 1 tablet by mouth every 12 (twelve) hours.   esomeprazole 40 MG capsule Commonly known as: NEXIUM Take 1 capsule by mouth daily.   finasteride 5 MG tablet Commonly known as: PROSCAR Take 1 tablet (5 mg total) by mouth daily.   FLUoxetine 20 MG capsule Commonly known as: PROZAC TAKE 1 CAPSULE BY MOUTH ONCE DAILY. TO BE COMBINED WITH 40MG .   FLUoxetine 40 MG capsule Commonly known as: PROZAC TAKE 1 CAPSULE (40 MG) BY MOUTH EVERY DAY - COMBINE WITH 20 MG = 60 MG TOTAL   folic acid 1 MG tablet Commonly known as: FOLVITE Take 1 tablet (1 mg total) by mouth daily. Start taking on: Aug 07, 2022   meclizine 25 MG tablet Commonly known as: ANTIVERT Take 1 tablet (25 mg total) by mouth 3 (three) times daily as needed for dizziness.   memantine 5 MG tablet Commonly known as: NAMENDA 5 mg 2 (two) times daily.   niacinamide 100 MG tablet Take 100 mg by mouth in the morning.   nitrofurantoin (macrocrystal-monohydrate) 100 MG capsule Commonly known as: MACROBID Take 1 capsule (100 mg total) by mouth every 12 (twelve) hours for 4 days.   ondansetron 4 MG disintegrating tablet Commonly known as: ZOFRAN-ODT Take 1 tablet (4 mg total) by mouth every 8 (eight) hours as needed for nausea or vomiting.   OVER THE COUNTER MEDICATION Take 1 tablet by mouth daily. ZINC   QUEtiapine 25 MG tablet Commonly known as: SEROquel Take 0.5-1 tablets (12.5-25 mg total) by mouth at bedtime as needed. For sleep, psychosis   simvastatin 40 MG tablet Commonly known as: ZOCOR Take 40 mg by mouth at bedtime.   tamsulosin 0.4 MG Caps capsule Commonly known as: FLOMAX Take 1 capsule (0.4 mg total) by mouth daily.   triamcinolone 0.1 % paste Commonly known as: KENALOG 1 Application 2 (two) times daily.   Vitamin B Complex Tabs Take by mouth.       Allergies  Allergen Reactions   Aricept [Donepezil]     Hallucination, confusion   Ciprofloxacin Other (See  Comments)   Erythromycin Swelling   Keflex [Cephalexin] Swelling   Penicillins Swelling    Has patient had a PCN reaction causing immediate rash, facial/tongue/throat swelling, SOB or lightheadedness with hypotension: Yes Has patient had a PCN reaction causing severe rash involving mucus membranes or skin necrosis: No Has patient had a PCN reaction that required hospitalization: Unknown Has patient had a PCN reaction occurring within the last 10 years: No If all of the above answers are "NO", then may proceed with Cephalosporin use.    Discharge Instructions     Call MD for:  difficulty breathing, headache or visual disturbances   Complete by: As directed  Call MD for:  extreme fatigue   Complete by: As directed    Call MD for:  persistant dizziness or light-headedness   Complete by: As directed    Call MD for:  persistant nausea and vomiting   Complete by: As directed    Call MD for:  severe uncontrolled pain   Complete by: As directed    Call MD for:  temperature >100.4   Complete by: As directed    Diet - low sodium heart healthy   Complete by: As directed    Discharge instructions   Complete by: As directed    F/u PCP   Increase activity slowly   Complete by: As directed        The results of significant diagnostics from this hospitalization (including imaging, microbiology, ancillary and laboratory) are listed below for reference.    Significant Diagnostic Studies: CT PELVIS WO CONTRAST  Result Date: 08/03/2022 CLINICAL DATA:  Pelvic fracture Rule out pelvic and hip fracture EXAM: CT PELVIS WITHOUT CONTRAST TECHNIQUE: Multidetector CT imaging of the pelvis was performed following the standard protocol without intravenous contrast. RADIATION DOSE REDUCTION: This exam was performed according to the departmental dose-optimization program which includes automated exposure control, adjustment of the mA and/or kV according to patient size and/or use of iterative  reconstruction technique. COMPARISON:  CT pelvis 01/20/2022 FINDINGS: Urinary Tract:  No abnormality visualized. Bowel: Colonic diverticulosis. Unremarkable visualized pelvic bowel loops. Status post appendectomy. Vascular/Lymphatic: No pathologically enlarged lymph nodes. No significant vascular abnormality seen. Reproductive:  No mass or other significant abnormality Other: Atherosclerotic plaque. No intraperitoneal free fluid. No intraperitoneal free gas. No organized fluid collection. Musculoskeletal: No abdominal wall hernia or abnormality. No suspicious lytic or blastic osseous lesions. No acute displaced fracture. No pelvic diastasis. No acute displaced fracture or dislocation of either hips. Degenerative changes of the spine. Intervertebral disc space vacuum phenomenon. IMPRESSION: Negative for acute traumatic injury. Electronically Signed   By: Tish Frederickson M.D.   On: 08/03/2022 21:10   CT LUMBAR SPINE WO CONTRAST  Result Date: 08/03/2022 CLINICAL DATA:  L2 fracture on x-ray, low back pain EXAM: CT LUMBAR SPINE WITHOUT CONTRAST TECHNIQUE: Multidetector CT imaging of the lumbar spine was performed without intravenous contrast administration. Multiplanar CT image reconstructions were also generated. RADIATION DOSE REDUCTION: This exam was performed according to the departmental dose-optimization program which includes automated exposure control, adjustment of the mA and/or kV according to patient size and/or use of iterative reconstruction technique. COMPARISON:  01/20/2022 CT chest abdomen pelvis, CT lumbar spine 04/16/2016. FINDINGS: Segmentation: 5 lumbar type vertebral bodies. Alignment: Trace retrolisthesis of L1 on L2. Trace anterolisthesis of L4 on L5 and L5 on S1. Levocurvature. Vertebrae: Superior endplate deformity at L2, with approximately 10% vertebral body height loss, which is favored to be acute. The fracture does not appear to extend through the posterior cortex or involve the posterior  elements. Vertebral body heights are otherwise preserved. Paraspinal and other soft tissues: No lymphadenopathy. Aortic atherosclerosis. Atrophy of the inferior paraspinous muscles. Disc levels: T12-L1: No significant disc bulge. Mild facet arthropathy. No spinal canal stenosis or neural foraminal narrowing. L1-L2: Trace retrolisthesis and minimal disc bulge. Mild facet arthropathy. No spinal canal stenosis or neural foraminal narrowing. L2-L3: Mild disc bulge. Moderate facet arthropathy. Ligamentum flavum hypertrophy. Mild spinal canal stenosis. Narrowing of the lateral recesses. Mild right neural foraminal narrowing. L3-L4: No significant disc bulge. Severe facet arthropathy. Ligamentum flavum hypertrophy. Mild spinal canal stenosis. No neural foraminal narrowing. L4-L5: Trace anterolisthesis  and mild disc bulge. Severe facet arthropathy. Ligamentum flavum hypertrophy. Mild-to-moderate spinal canal stenosis. Narrowing of the lateral recesses. Mild bilateral neural foraminal narrowing. L5-S1: Trace anterolisthesis and mild disc bulge. Status post laminectomy. Severe facet arthropathy. No spinal canal stenosis. Mild bilateral neural foraminal narrowing. IMPRESSION: 1. Superior endplate compression fracture at L2, with approximately 10% vertebral body height loss, which is favored to be acute. 2. Multilevel degenerative changes, with mild spinal canal stenosis at L2-L3 and L3-L4 and mild-to-moderate spinal canal stenosis at L4-L5. Mild neural foraminal narrowing on the right at L2-L3 and bilaterally at L4-L5 and L5-S1. Electronically Signed   By: Wiliam Ke M.D.   On: 08/03/2022 19:57   ECHOCARDIOGRAM COMPLETE  Result Date: 08/03/2022    ECHOCARDIOGRAM REPORT   Patient Name:   Mark [name]. Date of Exam: 08/03/2022 Medical Rec #:  [id number]       Height:       76.0 in Accession #:    [id number]      Weight:       225.1 lb Date of Birth:  06/18/1940      BSA:          2.329 m Patient Age:    40 years         BP:           128/66 mmHg Patient Gender: M               HR:           95 bpm. Exam Location:  [facility] Procedure: 2D Echo, Cardiac Doppler, Color Doppler and Intracardiac            Opacification Agent Indications:     Mitral valve insufficiency  History:         Patient has prior history of Echocardiogram examinations, most                  recent 10/24/2017. Arrythmias:Atrial Fibrillation,                  Signs/Symptoms:Murmur, Shortness of Breath and Syncope; Risk                  Factors:Dyslipidemia. Pulmonary embolus.  Sonographer:     Mikki Harbor Referring Phys:  [id number] Gillis Santa Diagnosing Phys: Lorine Bears MD  Sonographer Comments: Technically difficult study due to poor echo windows. Image acquisition challenging due to respiratory motion. IMPRESSIONS  1. Left ventricular ejection fraction, by estimation, is 55 to 60%. The left ventricle has normal function. Left ventricular endocardial border not optimally defined to evaluate regional wall motion. There is mild left ventricular hypertrophy. Left ventricular diastolic parameters were normal.  2. Right ventricular systolic function is normal. The right ventricular size is normal. Tricuspid regurgitation signal is inadequate for assessing PA pressure.  3. Left atrial size was mildly dilated.  4. The mitral valve was not well visualized. Trivial mitral valve regurgitation. No evidence of mitral stenosis.  5. The aortic valve is normal in structure. Aortic valve regurgitation is not visualized. Aortic valve sclerosis/calcification is present, without any evidence of aortic stenosis. FINDINGS  Left Ventricle: Left ventricular ejection fraction, by estimation, is 55 to 60%. The left ventricle has normal function. Left ventricular endocardial border not optimally defined to evaluate regional wall motion. Definity contrast agent was given IV to delineate the left ventricular endocardial borders. The left ventricular internal cavity size was normal in  size. There is mild left ventricular hypertrophy. Left ventricular diastolic  parameters were normal. Right Ventricle: The right ventricular size is normal. No increase in right ventricular wall thickness. Right ventricular systolic function is normal. Tricuspid regurgitation signal is inadequate for assessing PA pressure. Left Atrium: Left atrial size was mildly dilated. Right Atrium: Right atrial size was normal in size. Pericardium: There is no evidence of pericardial effusion. Mitral Valve: The mitral valve was not well visualized. Trivial mitral valve regurgitation. No evidence of mitral valve stenosis. MV peak gradient, 4.7 mmHg. The mean mitral valve gradient is 2.0 mmHg. Tricuspid Valve: The tricuspid valve is normal in structure. Tricuspid valve regurgitation is not demonstrated. No evidence of tricuspid stenosis. Aortic Valve: The aortic valve is normal in structure. Aortic valve regurgitation is not visualized. Aortic valve sclerosis/calcification is present, without any evidence of aortic stenosis. Aortic valve mean gradient measures 6.0 mmHg. Aortic valve peak  gradient measures 11.0 mmHg. Aortic valve area, by VTI measures 2.08 cm. Pulmonic Valve: The pulmonic valve was normal in structure. Pulmonic valve regurgitation is not visualized. No evidence of pulmonic stenosis. Aorta: The aortic root is normal in size and structure. Venous: The inferior vena cava was not well visualized. IAS/Shunts: No atrial level shunt detected by color flow Doppler.  LEFT VENTRICLE PLAX 2D LVIDd:         4.40 cm   Diastology LVIDs:         3.20 cm   LV e' medial:   7.07 cm/s LV PW:         1.10 cm   LV E/e' medial: 11.3 LV IVS:        1.20 cm LVOT diam:     2.10 cm LV SV:         63 LV SV Index:   27 LVOT Area:     3.46 cm  LEFT ATRIUM           Index LA diam:      3.60 cm 1.55 cm/m LA Vol (A4C): 55.6 ml 23.87 ml/m  AORTIC VALVE AV Area (Vmax):    2.26 cm AV Area (Vmean):   2.00 cm AV Area (VTI):     2.08 cm AV Vmax:            165.50 cm/s AV Vmean:          113.500 cm/s AV VTI:            0.302 m AV Peak Grad:      11.0 mmHg AV Mean Grad:      6.0 mmHg LVOT Vmax:         108.00 cm/s LVOT Vmean:        65.600 cm/s LVOT VTI:          0.181 m LVOT/AV VTI ratio: 0.60  AORTA Ao Root diam: 3.60 cm MITRAL VALVE MV Area (PHT): 4.31 cm    SHUNTS MV Area VTI:   2.75 cm    Systemic VTI:  0.18 m MV Peak grad:  4.7 mmHg    Systemic Diam: 2.10 cm MV Mean grad:  2.0 mmHg MV Vmax:       1.08 m/s MV Vmean:      67.3 cm/s MV Decel Time: 176 msec MV E velocity: 79.70 cm/s MV A velocity: 92.20 cm/s MV E/A ratio:  0.86 Lorine Bears MD Electronically signed by Lorine Bears MD Signature Date/Time: 08/03/2022/5:39:31 PM    Final    CT Head Wo Contrast  Result Date: 08/02/2022 CLINICAL DATA:  Head trauma, minor (Age >= 65y); Neck  trauma (Age >= 65y) EXAM: CT HEAD WITHOUT CONTRAST CT CERVICAL SPINE WITHOUT CONTRAST TECHNIQUE: Multidetector CT imaging of the head and cervical spine was performed following the standard protocol without intravenous contrast. Multiplanar CT image reconstructions of the cervical spine were also generated. RADIATION DOSE REDUCTION: This exam was performed according to the departmental dose-optimization program which includes automated exposure control, adjustment of the mA and/or kV according to patient size and/or use of iterative reconstruction technique. COMPARISON:  CT C-spine 08/01/2022, CT head 08/01/2022 FINDINGS: CT HEAD FINDINGS Brain: Cerebral ventricle sizes are concordant with the degree of cerebral volume loss. Patchy and confluent areas of decreased attenuation are noted throughout the deep and periventricular white matter of the cerebral hemispheres bilaterally, compatible with chronic microvascular ischemic disease. No evidence of large-territorial acute infarction. No parenchymal hemorrhage. No mass lesion. No extra-axial collection. No mass effect or midline shift. No hydrocephalus. Basilar cisterns  are patent. Vascular: No hyperdense vessel. Skull: No acute fracture or focal lesion. Temporomandibular joint degenerative changes. Sinuses/Orbits: Paranasal sinuses and mastoid air cells are clear. The orbits are unremarkable. Other: None. CT CERVICAL SPINE FINDINGS Alignment: Stable grade 2 anterolisthesis of C3 on C4. Stable grade 1 anterolisthesis of C7 on T1. Skull base and vertebrae: Multilevel moderate to severe degenerative changes of the spine with associated severe right C6-C7 osseous neural foraminal stenosis. No severe osseous central canal stenosis. No acute fracture. No aggressive appearing focal osseous lesion or focal pathologic process. Soft tissues and spinal canal: No prevertebral fluid or swelling. No visible canal hematoma. Upper chest: Unremarkable. Other: None. IMPRESSION: 1. No acute intracranial abnormality. 2. No acute displaced fracture or traumatic listhesis of the cervical spine. 3. Stable grade 2 anterolisthesis of C3 on C4 and grade 1 on C7 on T1. 4. 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 Electronically Signed   By: Tish Frederickson M.D.   On: 08/02/2022 19:08   CT Cervical Spine Wo Contrast  Result Date: 08/02/2022 CLINICAL DATA:  Head trauma, minor (Age >= 65y); Neck trauma (Age >= 65y) EXAM: CT HEAD WITHOUT CONTRAST CT CERVICAL SPINE WITHOUT CONTRAST TECHNIQUE: Multidetector CT imaging of the head and cervical spine was performed following the standard protocol without intravenous contrast. Multiplanar CT image reconstructions of the cervical spine were also generated. RADIATION DOSE REDUCTION: This exam was performed according to the departmental dose-optimization program which includes automated exposure control, adjustment of the mA and/or kV according to patient size and/or use of iterative reconstruction technique. COMPARISON:  CT C-spine 08/01/2022, CT head 08/01/2022 FINDINGS: CT  HEAD FINDINGS Brain: Cerebral ventricle sizes are concordant with the degree of cerebral volume loss. Patchy and confluent areas of decreased attenuation are noted throughout the deep and periventricular white matter of the cerebral hemispheres bilaterally, compatible with chronic microvascular ischemic disease. No evidence of large-territorial acute infarction. No parenchymal hemorrhage. No mass lesion. No extra-axial collection. No mass effect or midline shift. No hydrocephalus. Basilar cisterns are patent. Vascular: No hyperdense vessel. Skull: No acute fracture or focal lesion. Temporomandibular joint degenerative changes. Sinuses/Orbits: Paranasal sinuses and mastoid air cells are clear. The orbits are unremarkable. Other: None. CT CERVICAL SPINE FINDINGS Alignment: Stable grade 2 anterolisthesis of C3 on C4. Stable grade 1 anterolisthesis of C7 on T1. Skull base and vertebrae: Multilevel moderate to severe degenerative changes of the spine with associated severe right C6-C7 osseous neural foraminal stenosis. No severe osseous central canal stenosis. No acute fracture. No aggressive appearing  focal osseous lesion or focal pathologic process. Soft tissues and spinal canal: No prevertebral fluid or swelling. No visible canal hematoma. Upper chest: Unremarkable. Other: None. IMPRESSION: 1. No acute intracranial abnormality. 2. No acute displaced fracture or traumatic listhesis of the cervical spine. 3. Stable grade 2 anterolisthesis of C3 on C4 and grade 1 on C7 on T1. 4. 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 Electronically Signed   By: Tish Frederickson M.D.   On: 08/02/2022 19:08   DG Lumbar Spine 2-3 Views  Result Date: 08/02/2022 CLINICAL DATA:  Back pain after fall EXAM: LUMBAR SPINE - 2-3 VIEW COMPARISON:  Lumbar spine x-ray 02/25/2016. CT chest abdomen and pelvis 01/20/2022. FINDINGS: There is mild compression  deformity of the superior endplate of L2 which is new from prior. This is age indeterminate. Vertebral body heights are otherwise well maintained. Alignment is anatomic. There is mild disc space narrowing throughout the lumbar spine compatible with degenerative change similar to prior. There are degenerative changes of lower lumbar facet joints. Soft tissues are within normal limits. IMPRESSION: Mild compression deformity of the superior endplate of L2 which is new from prior. This is age indeterminate. Correlate with point tenderness. Electronically Signed   By: Darliss Cheney M.D.   On: 08/02/2022 18:58   DG Chest 2 View  Result Date: 08/02/2022 CLINICAL DATA:  Low O2 sat EXAM: CHEST - 2 VIEW COMPARISON:  Chest x-ray 01/16/2022 FINDINGS: The heart size and mediastinal contours are within normal limits. Both lungs are clear. The visualized skeletal structures are unremarkable. IMPRESSION: No active cardiopulmonary disease. Electronically Signed   By: Darliss Cheney M.D.   On: 08/02/2022 18:56   CT Cervical Spine Wo Contrast  Result Date: 08/01/2022 CLINICAL DATA:  Status post fall. EXAM: CT CERVICAL SPINE WITHOUT CONTRAST TECHNIQUE: Multidetector CT imaging of the cervical spine was performed without intravenous contrast. Multiplanar CT image reconstructions were also generated. RADIATION DOSE REDUCTION: This exam was performed according to the departmental dose-optimization program which includes automated exposure control, adjustment of the mA and/or kV according to patient size and/or use of iterative reconstruction technique. COMPARISON:  January 16, 2022 FINDINGS: Alignment: There is stable, chronic, approximately 4 mm anterolisthesis of the C3 vertebral body on C4. Stable, chronic anterolisthesis of C7 on T1 is also noted. Skull base and vertebrae: No acute fracture. No primary bone lesion or focal pathologic process. Degenerative changes are seen involving the tip of the dens and the adjacent portion  of the anterior arch of C1. Soft tissues and spinal canal: No prevertebral fluid or swelling. No visible canal hematoma. Disc levels: Marked severity endplate sclerosis, anterior osteophyte formation and posterior bony spurring are seen at the levels of C4-C5, C5-C6, C6-C7 and C7-T1. There is marked severity narrowing of the anterior atlantoaxial articulation. Marked severity intervertebral disc space narrowing is seen at C4-C5, C5-C6, C6-C7 and C7-T1. Bilateral marked severity multilevel facet joint hypertrophy is noted. Upper chest: Emphysematous lung disease is seen within the bilateral upper lobes. Other: The left lobe of the thyroid gland is enlarged, lobulated and heterogeneous in appearance. IMPRESSION: 1. No acute fracture or traumatic subluxation of the cervical spine. 2. Stable, chronic, 4 mm anterolisthesis of the C3 vertebral body on C4. 3. Stable, chronic anterolisthesis of C7 on T1. 4. Marked severity multilevel degenerative changes, as described above. 5. Emphysematous lung disease. 6. Enlarged, lobulated and heterogeneous left lobe of the thyroid gland. Correlation with nonemergent thyroid  ultrasound is recommended. Emphysema (ICD10-J43.9). Electronically Signed   By: Aram Candela M.D.   On: 08/01/2022 20:02   CT Head Wo Contrast  Result Date: 08/01/2022 CLINICAL DATA:  Head trauma EXAM: CT HEAD WITHOUT CONTRAST TECHNIQUE: Contiguous axial images were obtained from the base of the skull through the vertex without intravenous contrast. RADIATION DOSE REDUCTION: This exam was performed according to the departmental dose-optimization program which includes automated exposure control, adjustment of the mA and/or kV according to patient size and/or use of iterative reconstruction technique. COMPARISON:  Head CT 01/16/2022 FINDINGS: Brain: No evidence of acute infarction, hemorrhage, hydrocephalus, extra-axial collection or mass lesion/mass effect. There is stable moderate patchy periventricular  and deep white matter hypodensity. There is stable moderate diffuse atrophy. Vascular: Atherosclerotic calcifications are present within the cavernous internal carotid arteries. Skull: Normal. Negative for fracture or focal lesion. Sinuses/Orbits: No acute finding. Other: None. IMPRESSION: 1. No acute intracranial abnormality. 2. 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 Electronically Signed   By: Darliss Cheney M.D.   On: 08/01/2022 19:53    Microbiology: Recent Results (from the past 240 hour(s))  CULTURE, URINE COMPREHENSIVE     Status: Abnormal   Collection Time: 07/30/22  3:56 PM   Specimen: Urine   UR  Result Value Ref Range Status   Urine Culture, Comprehensive Final report (A)  Final   Organism ID, Bacteria Comment (A)  Final    Comment: Staphylococcus epidermidis Based on resistance to oxacillin this isolate would be resistant to all currently available beta-lactam antimicrobial agents, with the exception of the newer cephalosporins with anti-MRSA activity, such as Ceftaroline Greater than 100,000 colony forming units per mL    ANTIMICROBIAL SUSCEPTIBILITY Comment  Final    Comment:       ** S = Susceptible; I = Intermediate; R = Resistant **                    P = Positive; N = Negative             MICS are expressed in micrograms per mL    Antibiotic                 RSLT#1    RSLT#2    RSLT#3    RSLT#4 Ciprofloxacin                  S Gentamicin                     S Levofloxacin                   S Linezolid                      S Moxifloxacin                   S Nitrofurantoin                 S Oxacillin                      R Penicillin                     R Quinupristin/Dalfopristin      S Rifampin                       S Tetracycline  R Trimethoprim/Sulfa             R Vancomycin                     S   Microscopic Examination     Status: Abnormal   Collection Time: 07/30/22  3:56 PM   Urine  Result Value Ref Range  Status   WBC, UA 11-30 (A) 0 - 5 /hpf Final   RBC, Urine 3-10 (A) 0 - 2 /hpf Final   Epithelial Cells (non renal) 0-10 0 - 10 /hpf Final   Casts Present (A) None seen /lpf Final   Cast Type Hyaline casts N/A Final   Mucus, UA Present (A) Not Estab. Final   Bacteria, UA Many (A) None seen/Few Final  Blood culture (routine x 2)     Status: None (Preliminary result)   Collection Time: 08/02/22  9:54 PM   Specimen: BLOOD  Result Value Ref Range Status   Specimen Description BLOOD HAND  Final   Special Requests   Final    BOTTLES DRAWN AEROBIC AND ANAEROBIC Blood Culture adequate volume   Culture   Final    NO GROWTH 4 DAYS Performed at [facility], [address] [place], [place] [address]    Report Status PENDING  Incomplete  Blood culture (routine x 2)     Status: None (Preliminary result)   Collection Time: 08/02/22  9:59 PM   Specimen: BLOOD  Result Value Ref Range Status   Specimen Description BLOOD HAND  Final   Special Requests   Final    BOTTLES DRAWN AEROBIC AND ANAEROBIC Blood Culture adequate volume   Culture   Final    NO GROWTH 4 DAYS Performed at [facility], [address] [place], [place] [address]    Report Status PENDING  Incomplete     Labs: CBC: Recent Labs  Lab 08/02/22 1808 08/03/22 0422 08/04/22 0514  WBC 10.7* 9.0 8.8  HGB 15.0 13.3 13.2  HCT 44.7 40.5 40.2  MCV 92.2 94.2 92.8  PLT 225 200 188   Basic Metabolic Panel: Recent Labs  Lab 08/02/22 1808 08/03/22 0422 08/03/22 0654 08/04/22 0514  NA 137 138  --  135  K 4.2 3.9  --  3.8  CL 102 105  --  102  CO2 22 25  --  24  GLUCOSE 114* 87  --  96  BUN 19 19  --  18  CREATININE 1.12 1.02  --  0.90  CALCIUM 9.2 8.9  --  8.4*  MG  --   --  2.2 2.3  PHOS  --   --  3.3 3.1   Liver Function Tests: Recent Labs  Lab 08/02/22 1808 08/03/22 0422  AST 25 21  ALT 16 14  ALKPHOS 66 58  BILITOT 0.9 1.1  PROT 7.6 6.6  ALBUMIN 4.1 3.6   No results for input(s):  "LIPASE", "AMYLASE" in the last 168 hours. No results for input(s): "AMMONIA" in the last 168 hours. Cardiac Enzymes: No results for input(s): "CKTOTAL", "CKMB", "CKMBINDEX", "TROPONINI" in the last 168 hours. BNP (last 3 results) Recent Labs    08/03/22 0214  BNP 116.2*   CBG: Recent Labs  Lab 08/04/22 0845  GLUCAP 108*    Time spent: 35 minutes  Signed:  Gillis Santa  [facility]  08/06/2022 11:32 AM

## 2022-08-06 NOTE — Progress Notes (Signed)
Patient being discharged to [facility]. Called report to nurse at [facility] and spoke to Pharr. All paper work in discharge packet. Patient being transported via EMS.

## 2022-08-06 NOTE — TOC Transition Note (Signed)
Transition of Care [facility]) - CM/SW Discharge Note   Patient Details  Name: Mark [name]. MRN: [id number] Date of Birth: 24-Dec-1940  Transition of [facility]) CM/SW Contact:  Allena Katz, LCSW Phone Number: 08/06/2022, 1:19 PM   Clinical Narrative:   Pt discharging to [facility]. Sue Lush notified. RN given number for report. DC summary sent. EMS arranged pt third in line.           Patient Goals and CMS Choice      Discharge Placement                         Discharge Plan and Services Additional resources added to the After Visit Summary for                                       Social Determinants of Health (SDOH) Interventions SDOH Screenings   Food Insecurity: No Food Insecurity (08/03/2022)  Housing: Low Risk  (08/03/2022)  Transportation Needs: No Transportation Needs (08/03/2022)  Utilities: Not At Risk (08/03/2022)  Depression (PHQ2-9): Low Risk  (07/30/2022)  Financial Resource Strain: Low Risk  (04/20/2018)  Physical Activity: Insufficiently Active (04/20/2018)  Social Connections: Unknown (04/20/2018)  Stress: No Stress Concern Present (04/20/2018)  Tobacco Use: Medium Risk (08/01/2022)     Readmission Risk Interventions     No data to display

## 2022-08-06 NOTE — Care Management Important Message (Signed)
Important Message  Patient Details  Name: Mark [name]. MRN: [id number] Date of Birth: Jan 08, 1941   Medicare Important Message Given:  Yes     [name] 08/06/2022, 11:36 AM

## 2022-08-07 ENCOUNTER — Non-Acute Institutional Stay (SKILLED_NURSING_FACILITY): Payer: Medicare Other | Admitting: Student

## 2022-08-07 ENCOUNTER — Encounter: Payer: Self-pay | Admitting: Student

## 2022-08-07 DIAGNOSIS — N400 Enlarged prostate without lower urinary tract symptoms: Secondary | ICD-10-CM

## 2022-08-07 DIAGNOSIS — F3342 Major depressive disorder, recurrent, in full remission: Secondary | ICD-10-CM

## 2022-08-07 DIAGNOSIS — I951 Orthostatic hypotension: Secondary | ICD-10-CM | POA: Diagnosis not present

## 2022-08-07 DIAGNOSIS — F03B2 Unspecified dementia, moderate, with psychotic disturbance: Secondary | ICD-10-CM

## 2022-08-07 DIAGNOSIS — K219 Gastro-esophageal reflux disease without esophagitis: Secondary | ICD-10-CM

## 2022-08-07 DIAGNOSIS — R296 Repeated falls: Secondary | ICD-10-CM

## 2022-08-07 DIAGNOSIS — R200 Anesthesia of skin: Secondary | ICD-10-CM

## 2022-08-07 DIAGNOSIS — Z9189 Other specified personal risk factors, not elsewhere classified: Secondary | ICD-10-CM

## 2022-08-07 DIAGNOSIS — F29 Unspecified psychosis not due to a substance or known physiological condition: Secondary | ICD-10-CM | POA: Diagnosis not present

## 2022-08-07 LAB — CULTURE, BLOOD (ROUTINE X 2)

## 2022-08-07 MED ORDER — QUETIAPINE FUMARATE 25 MG PO TABS: 12.5000 mg | ORAL_TABLET | Freq: Every evening | ORAL | 0 refills | Status: DC | PRN

## 2022-08-07 NOTE — Progress Notes (Signed)
Provider:  Dr. Earnestine Mealing Location:  Other [place].  Nursing Home Room Number: [facility] 101A Place of Service:  SNF (31)  PCP: Barbette Reichmann, MD Patient Care Team: Barbette Reichmann, MD as PCP - General (Internal Medicine) Ortel, Huston Foley, MD as Referring Physician (Hematology and Oncology)  Extended Emergency Contact Information Primary Emergency Contact: Dunnaway,[name] S Address: [address]          [place], [place] [address] [place] Home Phone: [phone] Mobile Phone: [phone] Relation: Spouse Secondary Emergency Contact: Stradford,Gaven  [place] Mobile Phone: [phone] Relation: Son  Code Status: Full Code Goals of Care: Advanced Directive information    08/07/2022    9:06 AM  Advanced Directives  Does Patient Have a Medical Advance Directive? No  Would patient like information on creating a medical advance directive? No - Patient declined      Chief Complaint  Patient presents with  . New Admit To SNF    Admission.     HPI: Patient is a 82 y.o. male seen today for admission to [facility] after admission to [facility] for multiple falls. He was diagnosed with a UTI and had increased weakness.   He is disoriented-- thinks he is at the hospital.   Oxygen supplement. When they take it off his oxygen is less than 90. He smoked in college, but not long term.   History provided by his wife, Dondra Spry, At home:  2019 Debarah Crape had his first UTI. No symptoms or fever, only way to figure it out was that he was having hallucinations and falls. Had HH a while.  2023 1 UTI in the fall. March check up- urine showed infection but not symptoms and he was started on antibiotic.  2024. Last week he was asking when he was going home. Thursday went to urology and was started on bactrim. He was progressing and getting worse. Saturday from den to bedroom and she is having to aid with his ambulation. Paramedics came to get him up and  helped him to the bathroom. Before getting to his chair he fell. He got the large hematoma on the back of his shoulder and hit his head. Went to ER for CT scan because he is on eliquis. Saturday he is worse and going back and forth. Sunday friends gave lunch and she takes him to the bathroom and moves his walker and he falls off the toilet and hit his head. May have been when he fractured his back. Went to ER and ortho stats were positive and oxygen was all over the place.   History of Dementia: he has used a walker since 2019. She helps with medications. He has not driven since 2019. Does not shower independently. Getting dressed needs a bit of help. Feeds himself.   Constipation -takes stool softner every day.   He was born and raised in [place]. Retired IT trainer.   Past Medical History:  Diagnosis Date  . Anxiety   . Atrial fibrillation (HCC)   . Back pain, chronic   . Cancer (HCC)    skin  . Colon polyp   . Cyst of left kidney   . Dementia (HCC)   . GERD (gastroesophageal reflux disease)   . Heart murmur   . Hyperlipemia   . Mitral valve prolapse   . Neuromuscular disorder (HCC)   . Neuropathy   . Osteoarthritis   . Pulmonary emboli (HCC)   . Spinal stenosis   . Thyroid nodule   .  Vertigo    Past Surgical History:  Procedure Laterality Date  . APPENDECTOMY    . BACK SURGERY    . COLONOSCOPY N/A 08/15/2014   Procedure: COLONOSCOPY;  Surgeon: Scot Jun, MD;  Location: [facility] ENDOSCOPY;  Service: Endoscopy;  Laterality: N/A;  . ESOPHAGOGASTRODUODENOSCOPY N/A 08/15/2014   Procedure: ESOPHAGOGASTRODUODENOSCOPY (EGD);  Surgeon: Scot Jun, MD;  Location: [facility] ENDOSCOPY;  Service: Endoscopy;  Laterality: N/A;  . TONSILLECTOMY      reports that he quit smoking about 64 years ago. His smoking use included cigarettes. He has never used smokeless tobacco. He reports current alcohol use of about 1.0 standard drink of alcohol per week. He reports that he does not use  drugs. Social History   Socioeconomic History  . Marital status: Married    Spouse name: gail  . Number of children: 2  . Years of education: Not on file  . Highest education level: Master's degree (e.g., MA, MS, MEng, MEd, MSW, MBA)  Occupational History  . Not on file  Tobacco Use  . Smoking status: Former    Types: Cigarettes    Quit date: 04/20/1958    Years since quitting: 64.3  . Smokeless tobacco: Never  Vaping Use  . Vaping Use: Never used  Substance and Sexual Activity  . Alcohol use: Yes    Alcohol/week: 1.0 standard drink of alcohol    Types: 1 Glasses of wine per week  . Drug use: Never  . Sexual activity: Not Currently  Other Topics Concern  . Not on file  Social History Narrative  . Not on file   Social Determinants of Health   Financial Resource Strain: Low Risk  (04/20/2018)   Overall Financial Resource Strain (CARDIA)   . Difficulty of Paying Living Expenses: Not hard at all  Food Insecurity: No Food Insecurity (08/03/2022)   Hunger Vital Sign   . Worried About Programme researcher, broadcasting/film/video in the Last Year: Never true   . Ran Out of Food in the Last Year: Never true  Transportation Needs: No Transportation Needs (08/03/2022)   PRAPARE - Transportation   . Lack of Transportation (Medical): No   . Lack of Transportation (Non-Medical): No  Physical Activity: Insufficiently Active (04/20/2018)   Exercise Vital Sign   . Days of Exercise per Week: 2 days   . Minutes of Exercise per Session: 60 min  Stress: No Stress Concern Present (04/20/2018)   [employer] of Occupational Health - Occupational Stress Questionnaire   . Feeling of Stress : Not at all  Social Connections: Unknown (04/20/2018)   Social Connection and Isolation Panel [NHANES]   . Frequency of Communication with Friends and Family: Not on file   . Frequency of Social Gatherings with Friends and Family: Not on file   . Attends Religious Services: More than 4 times per year   . Active Member of Clubs  or Organizations: Yes   . Attends Banker Meetings: More than 4 times per year   . Marital Status: Married  Catering manager Violence: Not At Risk (08/03/2022)   Humiliation, Afraid, Rape, and Kick questionnaire   . Fear of Current or Ex-Partner: No   . Emotionally Abused: No   . Physically Abused: No   . Sexually Abused: No    Functional Status Survey:    Family History  Problem Relation Age of Onset  . Stroke Mother   . COPD Father     Health Maintenance  Topic Date Due  .  Medicare Annual Wellness (AWV)  Never done  . COVID-19 Vaccine (4 - 2023-24 season) 11/21/2021  . INFLUENZA VACCINE  10/22/2022  . DTaP/Tdap/Td (3 - Td or Tdap) 08/01/2032  . Pneumonia Vaccine 42+ Years old  Completed  . Zoster Vaccines- Shingrix  Completed  . HPV VACCINES  Aged Out    Allergies  Allergen Reactions  . Aricept [Donepezil]     Hallucination, confusion  . Ciprofloxacin Other (See Comments)  . Erythromycin Swelling  . Keflex [Cephalexin] Swelling  . Penicillins Swelling    Has patient had a PCN reaction causing immediate rash, facial/tongue/throat swelling, SOB or lightheadedness with hypotension: Yes Has patient had a PCN reaction causing severe rash involving mucus membranes or skin necrosis: No Has patient had a PCN reaction that required hospitalization: Unknown Has patient had a PCN reaction occurring within the last 10 years: No If all of the above answers are "NO", then may proceed with Cephalosporin use.     Outpatient Encounter Medications as of 08/07/2022  Medication Sig  . acetaminophen (TYLENOL) 325 MG tablet Take 2 tablets (650 mg total) by mouth every 6 (six) hours as needed for mild pain, moderate pain, fever or headache (or Fever >/= 101).  Marland Kitchen apixaban (ELIQUIS) 2.5 MG TABS tablet Take 1 tablet by mouth every 12 (twelve) hours.  . B Complex Vitamins (VITAMIN B COMPLEX) TABS Take by mouth.  Marland Kitchen buPROPion (WELLBUTRIN) 75 MG tablet Take 75 mg by mouth daily  with breakfast.  . cholecalciferol (VITAMIN D3) 10 MCG (400 UNIT) TABS tablet Take 400 Units by mouth daily.  . Coenzyme Q10 (CO Q 10 PO) Take 1 Dose by mouth daily.   . Cyanocobalamin (B-12) 5000 MCG CAPS Take 5,000 mg by mouth daily.  Marland Kitchen docusate sodium (COLACE) 100 MG capsule Take 300 mg by mouth at bedtime.  Marland Kitchen esomeprazole (NEXIUM) 40 MG capsule Take 1 capsule by mouth daily.  . finasteride (PROSCAR) 5 MG tablet Take 1 tablet (5 mg total) by mouth daily.  Marland Kitchen FLUoxetine (PROZAC) 20 MG capsule TAKE 1 CAPSULE BY MOUTH ONCE DAILY. TO BE COMBINED WITH 40MG .  . FLUoxetine (PROZAC) 40 MG capsule TAKE 1 CAPSULE (40 MG) BY MOUTH EVERY DAY - COMBINE WITH 20 MG = 60 MG TOTAL  . folic acid (FOLVITE) 1 MG tablet Take 1 tablet (1 mg total) by mouth daily.  . meclizine (ANTIVERT) 25 MG tablet Take 1 tablet (25 mg total) by mouth 3 (three) times daily as needed for dizziness.  . memantine (NAMENDA) 5 MG tablet 5 mg 2 (two) times daily.  . niacinamide 100 MG tablet Take 100 mg by mouth in the morning.  . nitrofurantoin, macrocrystal-monohydrate, (MACROBID) 100 MG capsule Take 1 capsule (100 mg total) by mouth every 12 (twelve) hours for 4 days.  . ondansetron (ZOFRAN-ODT) 4 MG disintegrating tablet Take 1 tablet (4 mg total) by mouth every 8 (eight) hours as needed for nausea or vomiting.  Marland Kitchen OVER THE COUNTER MEDICATION Take 1 tablet by mouth daily. ZINC  . pantoprazole (PROTONIX) 40 MG tablet Take 40 mg by mouth daily.  . QUEtiapine (SEROQUEL) 25 MG tablet Take 0.5-1 tablets (12.5-25 mg total) by mouth at bedtime as needed. For sleep, psychosis  . tamsulosin (FLOMAX) 0.4 MG CAPS capsule Take 1 capsule (0.4 mg total) by mouth daily.  Marland Kitchen triamcinolone (KENALOG) 0.1 % paste 1 Application 2 (two) times daily.  . Zinc Oxide (TRIPLE PASTE) 12.8 % ointment Apply 1 Application topically as needed for irritation. Every shift.  . [  DISCONTINUED] simvastatin (ZOCOR) 40 MG tablet Take 40 mg by mouth at bedtime.     Facility-Administered Encounter Medications as of 08/07/2022  Medication  . nystatin (MYCOSTATIN/NYSTOP) topical powder    Review of Systems  Vitals:   08/07/22 0858 08/07/22 0908  BP: (!) 150/82 (!) 148/71  Pulse: 93   Resp: (!) 22   Temp: 98.2 F (36.8 C)   SpO2: 90%   Weight: 223 lb (101.2 kg)   Height: 6\' 4"  (1.93 m)    Body mass index is 27.14 kg/m. Physical Exam Vitals reviewed.  Constitutional:      Appearance: Normal appearance.  Cardiovascular:     Rate and Rhythm: Normal rate and regular rhythm.     Pulses: Normal pulses.     Heart sounds: Normal heart sounds.  Pulmonary:     Effort: Pulmonary effort is normal.     Breath sounds: Normal breath sounds.  Abdominal:     General: Abdomen is flat. Bowel sounds are normal.     Palpations: Abdomen is soft.  Skin:    General: Skin is warm and dry.  Neurological:     Mental Status: He is alert. Mental status is at baseline. He is disoriented.     Comments: Oriented to self, month, year, but location he states we are at [facility] on multiple occasions.   Psychiatric:        Mood and Affect: Mood normal.    Labs reviewed: Basic Metabolic Panel: Recent Labs    08/02/22 1808 08/03/22 0422 08/03/22 0654 08/04/22 0514  NA 137 138  --  135  K 4.2 3.9  --  3.8  CL 102 105  --  102  CO2 22 25  --  24  GLUCOSE 114* 87  --  96  BUN 19 19  --  18  CREATININE 1.12 1.02  --  0.90  CALCIUM 9.2 8.9  --  8.4*  MG  --   --  2.2 2.3  PHOS  --   --  3.3 3.1   Liver Function Tests: Recent Labs    08/02/22 1808 08/03/22 0422  AST 25 21  ALT 16 14  ALKPHOS 66 58  BILITOT 0.9 1.1  PROT 7.6 6.6  ALBUMIN 4.1 3.6   No results for input(s): "LIPASE", "AMYLASE" in the last 8760 hours. No results for input(s): "AMMONIA" in the last 8760 hours. CBC: Recent Labs    01/16/22 1416 08/02/22 1808 08/03/22 0422 08/04/22 0514  WBC 8.7 10.7* 9.0 8.8  NEUTROABS 6.4  --   --   --   HGB 14.3 15.0 13.3 13.2  HCT 43.6 44.7  40.5 40.2  MCV 93.2 92.2 94.2 92.8  PLT 223 225 200 188   Cardiac Enzymes: No results for input(s): "CKTOTAL", "CKMB", "CKMBINDEX", "TROPONINI" in the last 8760 hours. BNP: Invalid input(s): "POCBNP" No results found for: "HGBA1C" Lab Results  Component Value Date   TSH 0.866 02/21/2018   No results found for: "VITAMINB12" Lab Results  Component Value Date   FOLATE 5.2 (L) 08/03/2022   No results found for: "IRON", "TIBC", "FERRITIN"  Imaging and Procedures obtained prior to SNF admission: CT PELVIS WO CONTRAST  Result Date: 08/03/2022 CLINICAL DATA:  Pelvic fracture Rule out pelvic and hip fracture EXAM: CT PELVIS WITHOUT CONTRAST TECHNIQUE: Multidetector CT imaging of the pelvis was performed following the standard protocol without intravenous contrast. RADIATION DOSE REDUCTION: This exam was performed according to the departmental dose-optimization program which includes automated exposure control,  adjustment of the mA and/or kV according to patient size and/or use of iterative reconstruction technique. COMPARISON:  CT pelvis 01/20/2022 FINDINGS: Urinary Tract:  No abnormality visualized. Bowel: Colonic diverticulosis. Unremarkable visualized pelvic bowel loops. Status post appendectomy. Vascular/Lymphatic: No pathologically enlarged lymph nodes. No significant vascular abnormality seen. Reproductive:  No mass or other significant abnormality Other: Atherosclerotic plaque. No intraperitoneal free fluid. No intraperitoneal free gas. No organized fluid collection. Musculoskeletal: No abdominal wall hernia or abnormality. No suspicious lytic or blastic osseous lesions. No acute displaced fracture. No pelvic diastasis. No acute displaced fracture or dislocation of either hips. Degenerative changes of the spine. Intervertebral disc space vacuum phenomenon. IMPRESSION: Negative for acute traumatic injury. Electronically Signed   By: Tish Frederickson M.D.   On: 08/03/2022 21:10   CT LUMBAR SPINE  WO CONTRAST  Result Date: 08/03/2022 CLINICAL DATA:  L2 fracture on x-ray, low back pain EXAM: CT LUMBAR SPINE WITHOUT CONTRAST TECHNIQUE: Multidetector CT imaging of the lumbar spine was performed without intravenous contrast administration. Multiplanar CT image reconstructions were also generated. RADIATION DOSE REDUCTION: This exam was performed according to the departmental dose-optimization program which includes automated exposure control, adjustment of the mA and/or kV according to patient size and/or use of iterative reconstruction technique. COMPARISON:  01/20/2022 CT chest abdomen pelvis, CT lumbar spine 04/16/2016. FINDINGS: Segmentation: 5 lumbar type vertebral bodies. Alignment: Trace retrolisthesis of L1 on L2. Trace anterolisthesis of L4 on L5 and L5 on S1. Levocurvature. Vertebrae: Superior endplate deformity at L2, with approximately 10% vertebral body height loss, which is favored to be acute. The fracture does not appear to extend through the posterior cortex or involve the posterior elements. Vertebral body heights are otherwise preserved. Paraspinal and other soft tissues: No lymphadenopathy. Aortic atherosclerosis. Atrophy of the inferior paraspinous muscles. Disc levels: T12-L1: No significant disc bulge. Mild facet arthropathy. No spinal canal stenosis or neural foraminal narrowing. L1-L2: Trace retrolisthesis and minimal disc bulge. Mild facet arthropathy. No spinal canal stenosis or neural foraminal narrowing. L2-L3: Mild disc bulge. Moderate facet arthropathy. Ligamentum flavum hypertrophy. Mild spinal canal stenosis. Narrowing of the lateral recesses. Mild right neural foraminal narrowing. L3-L4: No significant disc bulge. Severe facet arthropathy. Ligamentum flavum hypertrophy. Mild spinal canal stenosis. No neural foraminal narrowing. L4-L5: Trace anterolisthesis and mild disc bulge. Severe facet arthropathy. Ligamentum flavum hypertrophy. Mild-to-moderate spinal canal stenosis.  Narrowing of the lateral recesses. Mild bilateral neural foraminal narrowing. L5-S1: Trace anterolisthesis and mild disc bulge. Status post laminectomy. Severe facet arthropathy. No spinal canal stenosis. Mild bilateral neural foraminal narrowing. IMPRESSION: 1. Superior endplate compression fracture at L2, with approximately 10% vertebral body height loss, which is favored to be acute. 2. Multilevel degenerative changes, with mild spinal canal stenosis at L2-L3 and L3-L4 and mild-to-moderate spinal canal stenosis at L4-L5. Mild neural foraminal narrowing on the right at L2-L3 and bilaterally at L4-L5 and L5-S1. Electronically Signed   By: Wiliam Ke M.D.   On: 08/03/2022 19:57   ECHOCARDIOGRAM COMPLETE  Result Date: 08/03/2022    ECHOCARDIOGRAM REPORT   Patient Name:   Nazair Grodin. Date of Exam: 08/03/2022 Medical Rec #:  [id number]       Height:       76.0 in Accession #:    [id number]      Weight:       225.1 lb Date of Birth:  1940-06-05      BSA:          2.329 m Patient Age:  81 years        BP:           128/66 mmHg Patient Gender: M               HR:           95 bpm. Exam Location:  [facility] Procedure: 2D Echo, Cardiac Doppler, Color Doppler and Intracardiac            Opacification Agent Indications:     Mitral valve insufficiency  History:         Patient has prior history of Echocardiogram examinations, most                  recent 10/24/2017. Arrythmias:Atrial Fibrillation,                  Signs/Symptoms:Murmur, Shortness of Breath and Syncope; Risk                  Factors:Dyslipidemia. Pulmonary embolus.  Sonographer:     Mikki Harbor Referring Phys:  [id number] Gillis Santa Diagnosing Phys: Lorine Bears MD  Sonographer Comments: Technically difficult study due to poor echo windows. Image acquisition challenging due to respiratory motion. IMPRESSIONS  1. Left ventricular ejection fraction, by estimation, is 55 to 60%. The left ventricle has normal function. Left ventricular endocardial border  not optimally defined to evaluate regional wall motion. There is mild left ventricular hypertrophy. Left ventricular diastolic parameters were normal.  2. Right ventricular systolic function is normal. The right ventricular size is normal. Tricuspid regurgitation signal is inadequate for assessing PA pressure.  3. Left atrial size was mildly dilated.  4. The mitral valve was not well visualized. Trivial mitral valve regurgitation. No evidence of mitral stenosis.  5. The aortic valve is normal in structure. Aortic valve regurgitation is not visualized. Aortic valve sclerosis/calcification is present, without any evidence of aortic stenosis. FINDINGS  Left Ventricle: Left ventricular ejection fraction, by estimation, is 55 to 60%. The left ventricle has normal function. Left ventricular endocardial border not optimally defined to evaluate regional wall motion. Definity contrast agent was given IV to delineate the left ventricular endocardial borders. The left ventricular internal cavity size was normal in size. There is mild left ventricular hypertrophy. Left ventricular diastolic parameters were normal. Right Ventricle: The right ventricular size is normal. No increase in right ventricular wall thickness. Right ventricular systolic function is normal. Tricuspid regurgitation signal is inadequate for assessing PA pressure. Left Atrium: Left atrial size was mildly dilated. Right Atrium: Right atrial size was normal in size. Pericardium: There is no evidence of pericardial effusion. Mitral Valve: The mitral valve was not well visualized. Trivial mitral valve regurgitation. No evidence of mitral valve stenosis. MV peak gradient, 4.7 mmHg. The mean mitral valve gradient is 2.0 mmHg. Tricuspid Valve: The tricuspid valve is normal in structure. Tricuspid valve regurgitation is not demonstrated. No evidence of tricuspid stenosis. Aortic Valve: The aortic valve is normal in structure. Aortic valve regurgitation is not  visualized. Aortic valve sclerosis/calcification is present, without any evidence of aortic stenosis. Aortic valve mean gradient measures 6.0 mmHg. Aortic valve peak  gradient measures 11.0 mmHg. Aortic valve area, by VTI measures 2.08 cm. Pulmonic Valve: The pulmonic valve was normal in structure. Pulmonic valve regurgitation is not visualized. No evidence of pulmonic stenosis. Aorta: The aortic root is normal in size and structure. Venous: The inferior vena cava was not well visualized. IAS/Shunts: No atrial level shunt detected by color flow Doppler.  LEFT  VENTRICLE PLAX 2D LVIDd:         4.40 cm   Diastology LVIDs:         3.20 cm   LV e' medial:   7.07 cm/s LV PW:         1.10 cm   LV E/e' medial: 11.3 LV IVS:        1.20 cm LVOT diam:     2.10 cm LV SV:         63 LV SV Index:   27 LVOT Area:     3.46 cm  LEFT ATRIUM           Index LA diam:      3.60 cm 1.55 cm/m LA Vol (A4C): 55.6 ml 23.87 ml/m  AORTIC VALVE AV Area (Vmax):    2.26 cm AV Area (Vmean):   2.00 cm AV Area (VTI):     2.08 cm AV Vmax:           165.50 cm/s AV Vmean:          113.500 cm/s AV VTI:            0.302 m AV Peak Grad:      11.0 mmHg AV Mean Grad:      6.0 mmHg LVOT Vmax:         108.00 cm/s LVOT Vmean:        65.600 cm/s LVOT VTI:          0.181 m LVOT/AV VTI ratio: 0.60  AORTA Ao Root diam: 3.60 cm MITRAL VALVE MV Area (PHT): 4.31 cm    SHUNTS MV Area VTI:   2.75 cm    Systemic VTI:  0.18 m MV Peak grad:  4.7 mmHg    Systemic Diam: 2.10 cm MV Mean grad:  2.0 mmHg MV Vmax:       1.08 m/s MV Vmean:      67.3 cm/s MV Decel Time: 176 msec MV E velocity: 79.70 cm/s MV A velocity: 92.20 cm/s MV E/A ratio:  0.86 Lorine Bears MD Electronically signed by Lorine Bears MD Signature Date/Time: 08/03/2022/5:39:31 PM    Final     Assessment/Plan .dia   Family/ staff Communication:   Labs/tests ordered:

## 2022-08-08 DIAGNOSIS — N400 Enlarged prostate without lower urinary tract symptoms: Secondary | ICD-10-CM | POA: Insufficient documentation

## 2022-08-20 ENCOUNTER — Non-Acute Institutional Stay (SKILLED_NURSING_FACILITY): Payer: Medicare Other | Admitting: Nurse Practitioner

## 2022-08-20 ENCOUNTER — Encounter: Payer: Self-pay | Admitting: Nurse Practitioner

## 2022-08-20 DIAGNOSIS — R296 Repeated falls: Secondary | ICD-10-CM | POA: Diagnosis not present

## 2022-08-20 DIAGNOSIS — K219 Gastro-esophageal reflux disease without esophagitis: Secondary | ICD-10-CM

## 2022-08-20 DIAGNOSIS — N400 Enlarged prostate without lower urinary tract symptoms: Secondary | ICD-10-CM

## 2022-08-20 DIAGNOSIS — I951 Orthostatic hypotension: Secondary | ICD-10-CM

## 2022-08-20 DIAGNOSIS — F3342 Major depressive disorder, recurrent, in full remission: Secondary | ICD-10-CM | POA: Diagnosis not present

## 2022-08-20 DIAGNOSIS — F03B2 Unspecified dementia, moderate, with psychotic disturbance: Secondary | ICD-10-CM

## 2022-08-20 DIAGNOSIS — R051 Acute cough: Secondary | ICD-10-CM

## 2022-08-20 DIAGNOSIS — F29 Unspecified psychosis not due to a substance or known physiological condition: Secondary | ICD-10-CM

## 2022-08-20 NOTE — Progress Notes (Signed)
Location:  Other [facility]) Nursing Home Room Number: 101 A Place of Service:  SNF [id number])  Provider: Janene Harvey. Janyth Contes, NP   PCP: Barbette Reichmann, MD Patient Care Team: Barbette Reichmann, MD as PCP - General (Internal Medicine) Ortel, Huston Foley, MD as Referring Physician (Hematology and Oncology)  Extended Emergency Contact Information Primary Emergency Contact: Focht,[name] S Address: [address]          [place], [place] [address] [place] Home Phone: [phone] Mobile Phone: [phone] Relation: Spouse Secondary Emergency Contact: Crehan,Pressley  [place] Mobile Phone: [phone] Relation: Son  Code Status: Full Code   Goals of care:  Advanced Directive information    08/20/2022    9:10 AM  Advanced Directives  Does Patient Have a Medical Advance Directive? No  Would patient like information on creating a medical advance directive? No - Patient declined     Allergies  Allergen Reactions   Aricept [Donepezil]     Hallucination, confusion   Ciprofloxacin Other (See Comments)   Erythromycin Swelling   Keflex [Cephalexin] Swelling   Penicillins Swelling    Has patient had a PCN reaction causing immediate rash, facial/tongue/throat swelling, SOB or lightheadedness with hypotension: Yes Has patient had a PCN reaction causing severe rash involving mucus membranes or skin necrosis: No Has patient had a PCN reaction that required hospitalization: Unknown Has patient had a PCN reaction occurring within the last 10 years: No If all of the above answers are "NO", then may proceed with Cephalosporin use.     Chief Complaint  Patient presents with   Discharge Note    Discharge from [facility] on 08/21/2022    HPI:  82 y.o. male  seen today for discharge home. Planning to be discharged on 08/22/22 after completing therapy. He is at [facility] after hospitalization for UTI with acute confusion and hypotension and multiple falls. He has  been doing much better. He lives with wife who helps with ADLs. Wife reports 5 days ago he got choked when taking medication and since then he has had more of a cough and congestion.  He has had increase in dyspnea with exertion, at rest he denies any shortness of breath. No fever or chills. Wife concerned about aspiration.     Past Medical History:  Diagnosis Date   Anxiety    Atrial fibrillation (HCC)    Back pain, chronic    Cancer (HCC)    skin   Colon polyp    Cyst of left kidney    Dementia (HCC)    GERD (gastroesophageal reflux disease)    Heart murmur    Hyperlipemia    Mitral valve prolapse    Neuromuscular disorder (HCC)    Neuropathy    Osteoarthritis    Pulmonary emboli (HCC)    Spinal stenosis    Thyroid nodule    Vertigo     Past Surgical History:  Procedure Laterality Date   APPENDECTOMY     BACK SURGERY     COLONOSCOPY N/A 08/15/2014   Procedure: COLONOSCOPY;  Surgeon: Scot Jun, MD;  Location: [facility] ENDOSCOPY;  Service: Endoscopy;  Laterality: N/A;   ESOPHAGOGASTRODUODENOSCOPY N/A 08/15/2014   Procedure: ESOPHAGOGASTRODUODENOSCOPY (EGD);  Surgeon: Scot Jun, MD;  Location: [facility] ENDOSCOPY;  Service: Endoscopy;  Laterality: N/A;   TONSILLECTOMY        reports that he quit smoking about 64 years ago. His smoking use included cigarettes. He has never used smokeless tobacco. He reports  current alcohol use of about 1.0 standard drink of alcohol per week. He reports that he does not use drugs. Social History   Socioeconomic History   Marital status: Married    Spouse name: gail   Number of children: 2   Years of education: Not on file   Highest education level: Master's degree (e.g., MA, MS, MEng, MEd, MSW, MBA)  Occupational History   Not on file  Tobacco Use   Smoking status: Former    Types: Cigarettes    Quit date: 04/20/1958    Years since quitting: 64.3   Smokeless tobacco: Never  Vaping Use   Vaping Use: Never used  Substance and  Sexual Activity   Alcohol use: Yes    Alcohol/week: 1.0 standard drink of alcohol    Types: 1 Glasses of wine per week   Drug use: Never   Sexual activity: Not Currently  Other Topics Concern   Not on file  Social History Narrative   Not on file   Social Determinants of Health   Financial Resource Strain: Low Risk  (04/20/2018)   Overall Financial Resource Strain (CARDIA)    Difficulty of Paying Living Expenses: Not hard at all  Food Insecurity: No Food Insecurity (08/03/2022)   Hunger Vital Sign    Worried About Running Out of Food in the Last Year: Never true    Ran Out of Food in the Last Year: Never true  Transportation Needs: No Transportation Needs (08/03/2022)   PRAPARE - Administrator, Civil Service (Medical): No    Lack of Transportation (Non-Medical): No  Physical Activity: Insufficiently Active (04/20/2018)   Exercise Vital Sign    Days of Exercise per Week: 2 days    Minutes of Exercise per Session: 60 min  Stress: No Stress Concern Present (04/20/2018)   [employer] of Occupational Health - Occupational Stress Questionnaire    Feeling of Stress : Not at all  Social Connections: Unknown (04/20/2018)   Social Connection and Isolation Panel [NHANES]    Frequency of Communication with Friends and Family: Not on file    Frequency of Social Gatherings with Friends and Family: Not on file    Attends Religious Services: More than 4 times per year    Active Member of [employer] or Organizations: Yes    Attends Banker Meetings: More than 4 times per year    Marital Status: Married  Catering manager Violence: Not At Risk (08/03/2022)   Humiliation, Afraid, Rape, and Kick questionnaire    Fear of Current or Ex-Partner: No    Emotionally Abused: No    Physically Abused: No    Sexually Abused: No   Functional Status Survey:    Allergies  Allergen Reactions   Aricept [Donepezil]     Hallucination, confusion   Ciprofloxacin Other (See Comments)    Erythromycin Swelling   Keflex [Cephalexin] Swelling   Penicillins Swelling    Has patient had a PCN reaction causing immediate rash, facial/tongue/throat swelling, SOB or lightheadedness with hypotension: Yes Has patient had a PCN reaction causing severe rash involving mucus membranes or skin necrosis: No Has patient had a PCN reaction that required hospitalization: Unknown Has patient had a PCN reaction occurring within the last 10 years: No If all of the above answers are "NO", then may proceed with Cephalosporin use.     Pertinent  Health Maintenance Due  Topic Date Due   INFLUENZA VACCINE  10/22/2022    Medications: Outpatient Encounter Medications as  of 08/20/2022  Medication Sig   acetaminophen (TYLENOL) 325 MG tablet Take 2 tablets (650 mg total) by mouth every 6 (six) hours as needed for mild pain, moderate pain, fever or headache (or Fever >/= 101).   apixaban (ELIQUIS) 2.5 MG TABS tablet Take 1 tablet by mouth every 12 (twelve) hours.   buPROPion (WELLBUTRIN) 75 MG tablet Take 75 mg by mouth daily with breakfast.   cholecalciferol (VITAMIN D3) 10 MCG (400 UNIT) TABS tablet Take 400 Units by mouth daily.   Coenzyme Q10 (CO Q 10 PO) Take 1 Dose by mouth daily.    Cyanocobalamin (B-12) 5000 MCG CAPS Take 5,000 mg by mouth daily.   docusate sodium (COLACE) 100 MG capsule Take 300 mg by mouth at bedtime.   esomeprazole (NEXIUM) 40 MG capsule Take 1 capsule by mouth daily.   finasteride (PROSCAR) 5 MG tablet Take 1 tablet (5 mg total) by mouth daily.   FLUoxetine HCl 60 MG TABS Take 1 tablet by mouth at bedtime. For depression   folic acid (FOLVITE) 1 MG tablet Take 1 tablet (1 mg total) by mouth daily.   meclizine (ANTIVERT) 25 MG tablet Take 1 tablet (25 mg total) by mouth 3 (three) times daily as needed for dizziness.   memantine (NAMENDA) 5 MG tablet 5 mg 2 (two) times daily.   niacinamide 100 MG tablet Take 100 mg by mouth in the morning.   ondansetron (ZOFRAN-ODT) 4 MG  disintegrating tablet Take 1 tablet (4 mg total) by mouth every 8 (eight) hours as needed for nausea or vomiting.   QUEtiapine (SEROQUEL) 25 MG tablet Take 0.5 tablets (12.5 mg total) by mouth at bedtime as needed. For sleep, psychosis   simvastatin (ZOCOR) 40 MG tablet Take 40 mg by mouth daily.   tamsulosin (FLOMAX) 0.4 MG CAPS capsule Take 1 capsule (0.4 mg total) by mouth daily.   triamcinolone (KENALOG) 0.1 % paste 1 Application 2 (two) times daily.   Zinc Oxide (TRIPLE PASTE) 12.8 % ointment Apply 1 Application topically as needed for irritation. Every shift.   OVER THE COUNTER MEDICATION Take 1 tablet by mouth daily. ZINC   [DISCONTINUED] FLUoxetine (PROZAC) 20 MG capsule TAKE 1 CAPSULE BY MOUTH ONCE DAILY. TO BE COMBINED WITH 40MG .   [DISCONTINUED] FLUoxetine (PROZAC) 40 MG capsule TAKE 1 CAPSULE (40 MG) BY MOUTH EVERY DAY - COMBINE WITH 20 MG = 60 MG TOTAL   Facility-Administered Encounter Medications as of 08/20/2022  Medication   nystatin (MYCOSTATIN/NYSTOP) topical powder    Review of Systems  Vitals:   08/20/22 0906  BP: 130/68  Pulse: 85  Resp: 18  Temp: 97.7 F (36.5 C)  SpO2: 94%  Weight: 227 lb 3.2 oz (103.1 kg)  Height: 6\' 4"  (1.93 m)   Body mass index is 27.66 kg/m. Physical Exam Constitutional:      General: He is not in acute distress.    Appearance: He is well-developed. He is not diaphoretic.  HENT:     Head: Normocephalic and atraumatic.     Right Ear: External ear normal.     Left Ear: External ear normal.     Mouth/Throat:     Pharynx: No oropharyngeal exudate.  Eyes:     Conjunctiva/sclera: Conjunctivae normal.     Pupils: Pupils are equal, round, and reactive to light.  Cardiovascular:     Rate and Rhythm: Normal rate and regular rhythm.     Heart sounds: Normal heart sounds.  Pulmonary:     Effort: Pulmonary  effort is normal.     Breath sounds: Wheezing and rhonchi present.     Comments: Rhonchi and wheezes noted to right lung  Abdominal:      General: Bowel sounds are normal.     Palpations: Abdomen is soft.  Musculoskeletal:        General: No tenderness.     Cervical back: Normal range of motion and neck supple.     Right lower leg: No edema.     Left lower leg: No edema.  Skin:    General: Skin is warm and dry.  Neurological:     Mental Status: He is alert. Mental status is at baseline.     Labs reviewed: Basic Metabolic Panel: Recent Labs    08/02/22 1808 08/03/22 0422 08/03/22 0654 08/04/22 0514  NA 137 138  --  135  K 4.2 3.9  --  3.8  CL 102 105  --  102  CO2 22 25  --  24  GLUCOSE 114* 87  --  96  BUN 19 19  --  18  CREATININE 1.12 1.02  --  0.90  CALCIUM 9.2 8.9  --  8.4*  MG  --   --  2.2 2.3  PHOS  --   --  3.3 3.1   Liver Function Tests: Recent Labs    08/02/22 1808 08/03/22 0422  AST 25 21  ALT 16 14  ALKPHOS 66 58  BILITOT 0.9 1.1  PROT 7.6 6.6  ALBUMIN 4.1 3.6   No results for input(s): "LIPASE", "AMYLASE" in the last 8760 hours. No results for input(s): "AMMONIA" in the last 8760 hours. CBC: Recent Labs    01/16/22 1416 08/02/22 1808 08/03/22 0422 08/04/22 0514  WBC 8.7 10.7* 9.0 8.8  NEUTROABS 6.4  --   --   --   HGB 14.3 15.0 13.3 13.2  HCT 43.6 44.7 40.5 40.2  MCV 93.2 92.2 94.2 92.8  PLT 223 225 200 188   Cardiac Enzymes: No results for input(s): "CKTOTAL", "CKMB", "CKMBINDEX", "TROPONINI" in the last 8760 hours. BNP: Invalid input(s): "POCBNP" CBG: Recent Labs    08/04/22 0845  GLUCAP 108*    Procedures and Imaging Studies During Stay: CT PELVIS WO CONTRAST  Result Date: 08/03/2022 CLINICAL DATA:  Pelvic fracture Rule out pelvic and hip fracture EXAM: CT PELVIS WITHOUT CONTRAST TECHNIQUE: Multidetector CT imaging of the pelvis was performed following the standard protocol without intravenous contrast. RADIATION DOSE REDUCTION: This exam was performed according to the departmental dose-optimization program which includes automated exposure control,  adjustment of the mA and/or kV according to patient size and/or use of iterative reconstruction technique. COMPARISON:  CT pelvis 01/20/2022 FINDINGS: Urinary Tract:  No abnormality visualized. Bowel: Colonic diverticulosis. Unremarkable visualized pelvic bowel loops. Status post appendectomy. Vascular/Lymphatic: No pathologically enlarged lymph nodes. No significant vascular abnormality seen. Reproductive:  No mass or other significant abnormality Other: Atherosclerotic plaque. No intraperitoneal free fluid. No intraperitoneal free gas. No organized fluid collection. Musculoskeletal: No abdominal wall hernia or abnormality. No suspicious lytic or blastic osseous lesions. No acute displaced fracture. No pelvic diastasis. No acute displaced fracture or dislocation of either hips. Degenerative changes of the spine. Intervertebral disc space vacuum phenomenon. IMPRESSION: Negative for acute traumatic injury. Electronically Signed   By: Tish Frederickson M.D.   On: 08/03/2022 21:10   CT LUMBAR SPINE WO CONTRAST  Result Date: 08/03/2022 CLINICAL DATA:  L2 fracture on x-ray, low back pain EXAM: CT LUMBAR SPINE WITHOUT CONTRAST TECHNIQUE: Multidetector  CT imaging of the lumbar spine was performed without intravenous contrast administration. Multiplanar CT image reconstructions were also generated. RADIATION DOSE REDUCTION: This exam was performed according to the departmental dose-optimization program which includes automated exposure control, adjustment of the mA and/or kV according to patient size and/or use of iterative reconstruction technique. COMPARISON:  01/20/2022 CT chest abdomen pelvis, CT lumbar spine 04/16/2016. FINDINGS: Segmentation: 5 lumbar type vertebral bodies. Alignment: Trace retrolisthesis of L1 on L2. Trace anterolisthesis of L4 on L5 and L5 on S1. Levocurvature. Vertebrae: Superior endplate deformity at L2, with approximately 10% vertebral body height loss, which is favored to be acute. The  fracture does not appear to extend through the posterior cortex or involve the posterior elements. Vertebral body heights are otherwise preserved. Paraspinal and other soft tissues: No lymphadenopathy. Aortic atherosclerosis. Atrophy of the inferior paraspinous muscles. Disc levels: T12-L1: No significant disc bulge. Mild facet arthropathy. No spinal canal stenosis or neural foraminal narrowing. L1-L2: Trace retrolisthesis and minimal disc bulge. Mild facet arthropathy. No spinal canal stenosis or neural foraminal narrowing. L2-L3: Mild disc bulge. Moderate facet arthropathy. Ligamentum flavum hypertrophy. Mild spinal canal stenosis. Narrowing of the lateral recesses. Mild right neural foraminal narrowing. L3-L4: No significant disc bulge. Severe facet arthropathy. Ligamentum flavum hypertrophy. Mild spinal canal stenosis. No neural foraminal narrowing. L4-L5: Trace anterolisthesis and mild disc bulge. Severe facet arthropathy. Ligamentum flavum hypertrophy. Mild-to-moderate spinal canal stenosis. Narrowing of the lateral recesses. Mild bilateral neural foraminal narrowing. L5-S1: Trace anterolisthesis and mild disc bulge. Status post laminectomy. Severe facet arthropathy. No spinal canal stenosis. Mild bilateral neural foraminal narrowing. IMPRESSION: 1. Superior endplate compression fracture at L2, with approximately 10% vertebral body height loss, which is favored to be acute. 2. Multilevel degenerative changes, with mild spinal canal stenosis at L2-L3 and L3-L4 and mild-to-moderate spinal canal stenosis at L4-L5. Mild neural foraminal narrowing on the right at L2-L3 and bilaterally at L4-L5 and L5-S1. Electronically Signed   By: Wiliam Ke M.D.   On: 08/03/2022 19:57   ECHOCARDIOGRAM COMPLETE  Result Date: 08/03/2022    ECHOCARDIOGRAM REPORT   Patient Name:   Leviathan Josie. Date of Exam: 08/03/2022 Medical Rec #:  [id number]       Height:       76.0 in Accession #:    [id number]      Weight:       225.1 lb  Date of Birth:  03-26-1940      BSA:          2.329 m Patient Age:    50 years        BP:           128/66 mmHg Patient Gender: M               HR:           95 bpm. Exam Location:  [facility] Procedure: 2D Echo, Cardiac Doppler, Color Doppler and Intracardiac            Opacification Agent Indications:     Mitral valve insufficiency  History:         Patient has prior history of Echocardiogram examinations, most                  recent 10/24/2017. Arrythmias:Atrial Fibrillation,                  Signs/Symptoms:Murmur, Shortness of Breath and Syncope; Risk  Factors:Dyslipidemia. Pulmonary embolus.  Sonographer:     Mikki Harbor Referring Phys:  [id number] Gillis Santa Diagnosing Phys: Lorine Bears MD  Sonographer Comments: Technically difficult study due to poor echo windows. Image acquisition challenging due to respiratory motion. IMPRESSIONS  1. Left ventricular ejection fraction, by estimation, is 55 to 60%. The left ventricle has normal function. Left ventricular endocardial border not optimally defined to evaluate regional wall motion. There is mild left ventricular hypertrophy. Left ventricular diastolic parameters were normal.  2. Right ventricular systolic function is normal. The right ventricular size is normal. Tricuspid regurgitation signal is inadequate for assessing PA pressure.  3. Left atrial size was mildly dilated.  4. The mitral valve was not well visualized. Trivial mitral valve regurgitation. No evidence of mitral stenosis.  5. The aortic valve is normal in structure. Aortic valve regurgitation is not visualized. Aortic valve sclerosis/calcification is present, without any evidence of aortic stenosis. FINDINGS  Left Ventricle: Left ventricular ejection fraction, by estimation, is 55 to 60%. The left ventricle has normal function. Left ventricular endocardial border not optimally defined to evaluate regional wall motion. Definity contrast agent was given IV to delineate the left  ventricular endocardial borders. The left ventricular internal cavity size was normal in size. There is mild left ventricular hypertrophy. Left ventricular diastolic parameters were normal. Right Ventricle: The right ventricular size is normal. No increase in right ventricular wall thickness. Right ventricular systolic function is normal. Tricuspid regurgitation signal is inadequate for assessing PA pressure. Left Atrium: Left atrial size was mildly dilated. Right Atrium: Right atrial size was normal in size. Pericardium: There is no evidence of pericardial effusion. Mitral Valve: The mitral valve was not well visualized. Trivial mitral valve regurgitation. No evidence of mitral valve stenosis. MV peak gradient, 4.7 mmHg. The mean mitral valve gradient is 2.0 mmHg. Tricuspid Valve: The tricuspid valve is normal in structure. Tricuspid valve regurgitation is not demonstrated. No evidence of tricuspid stenosis. Aortic Valve: The aortic valve is normal in structure. Aortic valve regurgitation is not visualized. Aortic valve sclerosis/calcification is present, without any evidence of aortic stenosis. Aortic valve mean gradient measures 6.0 mmHg. Aortic valve peak  gradient measures 11.0 mmHg. Aortic valve area, by VTI measures 2.08 cm. Pulmonic Valve: The pulmonic valve was normal in structure. Pulmonic valve regurgitation is not visualized. No evidence of pulmonic stenosis. Aorta: The aortic root is normal in size and structure. Venous: The inferior vena cava was not well visualized. IAS/Shunts: No atrial level shunt detected by color flow Doppler.  LEFT VENTRICLE PLAX 2D LVIDd:         4.40 cm   Diastology LVIDs:         3.20 cm   LV e' medial:   7.07 cm/s LV PW:         1.10 cm   LV E/e' medial: 11.3 LV IVS:        1.20 cm LVOT diam:     2.10 cm LV SV:         63 LV SV Index:   27 LVOT Area:     3.46 cm  LEFT ATRIUM           Index LA diam:      3.60 cm 1.55 cm/m LA Vol (A4C): 55.6 ml 23.87 ml/m  AORTIC VALVE AV  Area (Vmax):    2.26 cm AV Area (Vmean):   2.00 cm AV Area (VTI):     2.08 cm AV Vmax:  165.50 cm/s AV Vmean:          113.500 cm/s AV VTI:            0.302 m AV Peak Grad:      11.0 mmHg AV Mean Grad:      6.0 mmHg LVOT Vmax:         108.00 cm/s LVOT Vmean:        65.600 cm/s LVOT VTI:          0.181 m LVOT/AV VTI ratio: 0.60  AORTA Ao Root diam: 3.60 cm MITRAL VALVE MV Area (PHT): 4.31 cm    SHUNTS MV Area VTI:   2.75 cm    Systemic VTI:  0.18 m MV Peak grad:  4.7 mmHg    Systemic Diam: 2.10 cm MV Mean grad:  2.0 mmHg MV Vmax:       1.08 m/s MV Vmean:      67.3 cm/s MV Decel Time: 176 msec MV E velocity: 79.70 cm/s MV A velocity: 92.20 cm/s MV E/A ratio:  0.86 Lorine Bears MD Electronically signed by Lorine Bears MD Signature Date/Time: 08/03/2022/5:39:31 PM    Final    CT Head Wo Contrast  Result Date: 08/02/2022 CLINICAL DATA:  Head trauma, minor (Age >= 65y); Neck trauma (Age >= 65y) EXAM: CT HEAD WITHOUT CONTRAST CT CERVICAL SPINE WITHOUT CONTRAST TECHNIQUE: Multidetector CT imaging of the head and cervical spine was performed following the standard protocol without intravenous contrast. Multiplanar CT image reconstructions of the cervical spine were also generated. RADIATION DOSE REDUCTION: This exam was performed according to the departmental dose-optimization program which includes automated exposure control, adjustment of the mA and/or kV according to patient size and/or use of iterative reconstruction technique. COMPARISON:  CT C-spine 08/01/2022, CT head 08/01/2022 FINDINGS: CT HEAD FINDINGS Brain: Cerebral ventricle sizes are concordant with the degree of cerebral volume loss. Patchy and confluent areas of decreased attenuation are noted throughout the deep and periventricular white matter of the cerebral hemispheres bilaterally, compatible with chronic microvascular ischemic disease. No evidence of large-territorial acute infarction. No parenchymal hemorrhage. No mass lesion. No  extra-axial collection. No mass effect or midline shift. No hydrocephalus. Basilar cisterns are patent. Vascular: No hyperdense vessel. Skull: No acute fracture or focal lesion. Temporomandibular joint degenerative changes. Sinuses/Orbits: Paranasal sinuses and mastoid air cells are clear. The orbits are unremarkable. Other: None. CT CERVICAL SPINE FINDINGS Alignment: Stable grade 2 anterolisthesis of C3 on C4. Stable grade 1 anterolisthesis of C7 on T1. Skull base and vertebrae: Multilevel moderate to severe degenerative changes of the spine with associated severe right C6-C7 osseous neural foraminal stenosis. No severe osseous central canal stenosis. No acute fracture. No aggressive appearing focal osseous lesion or focal pathologic process. Soft tissues and spinal canal: No prevertebral fluid or swelling. No visible canal hematoma. Upper chest: Unremarkable. Other: None. IMPRESSION: 1. No acute intracranial abnormality. 2. No acute displaced fracture or traumatic listhesis of the cervical spine. 3. Stable grade 2 anterolisthesis of C3 on C4 and grade 1 on C7 on T1. 4. 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 Electronically Signed   By: Tish Frederickson M.D.   On: 08/02/2022 19:08   CT Cervical Spine Wo Contrast  Result Date: 08/02/2022 CLINICAL DATA:  Head trauma, minor (Age >= 65y); Neck trauma (Age >= 65y) EXAM: CT HEAD WITHOUT CONTRAST CT CERVICAL SPINE WITHOUT CONTRAST TECHNIQUE: Multidetector CT imaging of the head and cervical spine  was performed following the standard protocol without intravenous contrast. Multiplanar CT image reconstructions of the cervical spine were also generated. RADIATION DOSE REDUCTION: This exam was performed according to the departmental dose-optimization program which includes automated exposure control, adjustment of the mA and/or kV according to patient size and/or use of iterative  reconstruction technique. COMPARISON:  CT C-spine 08/01/2022, CT head 08/01/2022 FINDINGS: CT HEAD FINDINGS Brain: Cerebral ventricle sizes are concordant with the degree of cerebral volume loss. Patchy and confluent areas of decreased attenuation are noted throughout the deep and periventricular white matter of the cerebral hemispheres bilaterally, compatible with chronic microvascular ischemic disease. No evidence of large-territorial acute infarction. No parenchymal hemorrhage. No mass lesion. No extra-axial collection. No mass effect or midline shift. No hydrocephalus. Basilar cisterns are patent. Vascular: No hyperdense vessel. Skull: No acute fracture or focal lesion. Temporomandibular joint degenerative changes. Sinuses/Orbits: Paranasal sinuses and mastoid air cells are clear. The orbits are unremarkable. Other: None. CT CERVICAL SPINE FINDINGS Alignment: Stable grade 2 anterolisthesis of C3 on C4. Stable grade 1 anterolisthesis of C7 on T1. Skull base and vertebrae: Multilevel moderate to severe degenerative changes of the spine with associated severe right C6-C7 osseous neural foraminal stenosis. No severe osseous central canal stenosis. No acute fracture. No aggressive appearing focal osseous lesion or focal pathologic process. Soft tissues and spinal canal: No prevertebral fluid or swelling. No visible canal hematoma. Upper chest: Unremarkable. Other: None. IMPRESSION: 1. No acute intracranial abnormality. 2. No acute displaced fracture or traumatic listhesis of the cervical spine. 3. Stable grade 2 anterolisthesis of C3 on C4 and grade 1 on C7 on T1. 4. 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 Electronically Signed   By: Tish Frederickson M.D.   On: 08/02/2022 19:08   DG Lumbar Spine 2-3 Views  Result Date: 08/02/2022 CLINICAL DATA:  Back pain after fall EXAM: LUMBAR SPINE - 2-3 VIEW COMPARISON:  Lumbar spine  x-ray 02/25/2016. CT chest abdomen and pelvis 01/20/2022. FINDINGS: There is mild compression deformity of the superior endplate of L2 which is new from prior. This is age indeterminate. Vertebral body heights are otherwise well maintained. Alignment is anatomic. There is mild disc space narrowing throughout the lumbar spine compatible with degenerative change similar to prior. There are degenerative changes of lower lumbar facet joints. Soft tissues are within normal limits. IMPRESSION: Mild compression deformity of the superior endplate of L2 which is new from prior. This is age indeterminate. Correlate with point tenderness. Electronically Signed   By: Darliss Cheney M.D.   On: 08/02/2022 18:58   DG Chest 2 View  Result Date: 08/02/2022 CLINICAL DATA:  Low O2 sat EXAM: CHEST - 2 VIEW COMPARISON:  Chest x-ray 01/16/2022 FINDINGS: The heart size and mediastinal contours are within normal limits. Both lungs are clear. The visualized skeletal structures are unremarkable. IMPRESSION: No active cardiopulmonary disease. Electronically Signed   By: Darliss Cheney M.D.   On: 08/02/2022 18:56   CT Cervical Spine Wo Contrast  Result Date: 08/01/2022 CLINICAL DATA:  Status post fall. EXAM: CT CERVICAL SPINE WITHOUT CONTRAST TECHNIQUE: Multidetector CT imaging of the cervical spine was performed without intravenous contrast. Multiplanar CT image reconstructions were also generated. RADIATION DOSE REDUCTION: This exam was performed according to the departmental dose-optimization program which includes automated exposure control, adjustment of the mA and/or kV according to patient size and/or use of iterative reconstruction technique. COMPARISON:  January 16, 2022 FINDINGS: Alignment: There  is stable, chronic, approximately 4 mm anterolisthesis of the C3 vertebral body on C4. Stable, chronic anterolisthesis of C7 on T1 is also noted. Skull base and vertebrae: No acute fracture. No primary bone lesion or focal pathologic  process. Degenerative changes are seen involving the tip of the dens and the adjacent portion of the anterior arch of C1. Soft tissues and spinal canal: No prevertebral fluid or swelling. No visible canal hematoma. Disc levels: Marked severity endplate sclerosis, anterior osteophyte formation and posterior bony spurring are seen at the levels of C4-C5, C5-C6, C6-C7 and C7-T1. There is marked severity narrowing of the anterior atlantoaxial articulation. Marked severity intervertebral disc space narrowing is seen at C4-C5, C5-C6, C6-C7 and C7-T1. Bilateral marked severity multilevel facet joint hypertrophy is noted. Upper chest: Emphysematous lung disease is seen within the bilateral upper lobes. Other: The left lobe of the thyroid gland is enlarged, lobulated and heterogeneous in appearance. IMPRESSION: 1. No acute fracture or traumatic subluxation of the cervical spine. 2. Stable, chronic, 4 mm anterolisthesis of the C3 vertebral body on C4. 3. Stable, chronic anterolisthesis of C7 on T1. 4. Marked severity multilevel degenerative changes, as described above. 5. Emphysematous lung disease. 6. Enlarged, lobulated and heterogeneous left lobe of the thyroid gland. Correlation with nonemergent thyroid ultrasound is recommended. Emphysema (ICD10-J43.9). Electronically Signed   By: Aram Candela M.D.   On: 08/01/2022 20:02   CT Head Wo Contrast  Result Date: 08/01/2022 CLINICAL DATA:  Head trauma EXAM: CT HEAD WITHOUT CONTRAST TECHNIQUE: Contiguous axial images were obtained from the base of the skull through the vertex without intravenous contrast. RADIATION DOSE REDUCTION: This exam was performed according to the departmental dose-optimization program which includes automated exposure control, adjustment of the mA and/or kV according to patient size and/or use of iterative reconstruction technique. COMPARISON:  Head CT 01/16/2022 FINDINGS: Brain: No evidence of acute infarction, hemorrhage, hydrocephalus,  extra-axial collection or mass lesion/mass effect. There is stable moderate patchy periventricular and deep white matter hypodensity. There is stable moderate diffuse atrophy. Vascular: Atherosclerotic calcifications are present within the cavernous internal carotid arteries. Skull: Normal. Negative for fracture or focal lesion. Sinuses/Orbits: No acute finding. Other: None. IMPRESSION: 1. No acute intracranial abnormality. 2. 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 Electronically Signed   By: Darliss Cheney M.D.   On: 08/01/2022 19:53    Assessment/Plan:   1. Recurrent falls Continues with therapy, has been doing well. Fall prevention has been discussed and with have Home health and supervision of wife once discharged home.   2. Orthostatic hypotension -has improved, continue to maintain proper hydration and TED hose.   3. Gastroesophageal reflux disease, unspecified whether esophagitis present Stable on Nexium.  4. MDD (major depressive disorder), recurrent, in full remission (HCC) Controled on fluoxetine.   5. Psychosis, unspecified psychosis type (HCC) Improved at this time. Continues on seroquel PRN  6. Moderate dementia with psychotic disturbance, unspecified dementia type (HCC) -Stable, no acute changes in cognitive or functional status, continue supportive care with family and staff  7. Benign prostatic hyperplasia, unspecified whether lower urinary tract symptoms present Controlled on flomax  8. Acute cough -cough and congestion since choking episode, he does have abnormal lung sounds noted on exam. Will get chest xray and cbc with diff to evaluate for pneumonia at this time prior to discharge.   Patient is being discharged with the following home health services:  PT/OT  Patient is being discharged with the following durable medical equipment:  none  Patient has been advised to f/u with their PCP in 1 week for a transitions of care visit- already has  appt.  Social services at their facility was responsible for arranging this appointment. Rx will be sent from facility.   Janene Harvey. [facility] [phone]

## 2022-09-28 ENCOUNTER — Encounter: Payer: Self-pay | Admitting: Psychiatry

## 2022-09-28 ENCOUNTER — Telehealth (INDEPENDENT_AMBULATORY_CARE_PROVIDER_SITE_OTHER): Payer: Medicare Other | Admitting: Psychiatry

## 2022-09-28 DIAGNOSIS — F3342 Major depressive disorder, recurrent, in full remission: Secondary | ICD-10-CM | POA: Diagnosis not present

## 2022-09-28 DIAGNOSIS — G4701 Insomnia due to medical condition: Secondary | ICD-10-CM | POA: Diagnosis not present

## 2022-09-28 DIAGNOSIS — Z8659 Personal history of other mental and behavioral disorders: Secondary | ICD-10-CM | POA: Diagnosis not present

## 2022-09-28 DIAGNOSIS — G3184 Mild cognitive impairment, so stated: Secondary | ICD-10-CM

## 2022-09-28 NOTE — Progress Notes (Signed)
Virtual Visit via Video Note  I connected with Mark [name]. on 09/28/22 at  3:00 PM EDT by a video enabled telemedicine application and verified that I am speaking with the correct person using two identifiers.  Location Provider Location : Remote Office  Patient Location : Home  Participants: Patient , Spouse,Provider    I discussed the limitations of evaluation and management by telemedicine and the availability of in person appointments. The patient expressed understanding and agreed to proceed.   I discussed the assessment and treatment plan with the patient. The patient was provided an opportunity to ask questions and all were answered. The patient agreed with the plan and demonstrated an understanding of the instructions.   The patient was advised to call back or seek an in-person evaluation if the symptoms worsen or if the condition fails to improve as anticipated.    [facility] MD OP Progress Note  09/28/2022 5:26 PM Mark [name].  MRN:  [id number]  Chief Complaint:  Chief Complaint  Patient presents with   Follow-up   Medication Refill   Anxiety   Depression   HPI: Mark [name] a 82 year old Caucasian male, lives in [place], has a history of MDD, insomnia, history of pneumothorax, history of syncopal episodes, BPH, RLS, obstructive sleep apnea noncompliant with CPAP, recent fall with spinal fracture, history of recurrent UTIs was evaluated by telemedicine today.  Patient as well as spouse, participated in the evaluation.  Spouse provided collateral information since patient is a limited historian.  According to spouse patient had UTI with fall and was admitted to the medical floor-08/02/2022 - 08/06/2022-I have reviewed notes per [facility] hospitalist Dr.Kumar -patient was started on Macrobid for UTI's since culture grew strains resistant to oxacillin and Bactrim.'  Patient also with L2 fracture status post fall,.'  Patient was released to SNF for 2 weeks.  Patient today  appeared to be alert, oriented to person place, situation.  Was able to give the month as July and the year as 2024, the day as Monday.  Patient appeared to be pleasant.  Reports he does feel frustrated since he is unable to do much at all due to his fracture.  As per wife patient was in a lot of pain up until recently.  They changed his tramadol to Vicodin and now he is sleeping better since pain is more managed.  He however continues to have trouble getting up and doing anything or walking.  He does have physical therapy couple of times a week.  Patient denies any suicidality, homicidality or perceptual disturbances.  Patient currently compliant on Wellbutrin and as per wife he continues to take Wellbutrin XL 150 mg daily and Prozac 60 mg p.o. daily.  Denies side effects.  Dose of Seroquel as needed available however has not been using it.  Not interested in referral for psychotherapy.  Denies any other concerns today.  Visit Diagnosis:    ICD-10-CM   1. MDD (major depressive disorder), recurrent, in full remission (HCC)  F33.42     2. Insomnia due to medical condition  G47.01    Depression, pain    3. MCI (mild cognitive impairment)  G31.84     4. Hx of psychosis  Z86.59       Past Psychiatric History: I have reviewed past psychiatric history from progress note on 04/20/2018.  Past trials of Prozac, Wellbutrin.  Past Medical History:  Past Medical History:  Diagnosis Date   Anxiety    Atrial  fibrillation (HCC)    Back pain, chronic    Cancer (HCC)    skin   Colon polyp    Cyst of left kidney    Dementia (HCC)    GERD (gastroesophageal reflux disease)    Heart murmur    Hyperlipemia    Mitral valve prolapse    Neuromuscular disorder (HCC)    Neuropathy    Osteoarthritis    Pulmonary emboli (HCC)    Spinal stenosis    Thyroid nodule    Vertigo     Past Surgical History:  Procedure Laterality Date   APPENDECTOMY     BACK SURGERY     COLONOSCOPY N/A 08/15/2014    Procedure: COLONOSCOPY;  Surgeon: Scot Jun, MD;  Location: [facility] ENDOSCOPY;  Service: Endoscopy;  Laterality: N/A;   ESOPHAGOGASTRODUODENOSCOPY N/A 08/15/2014   Procedure: ESOPHAGOGASTRODUODENOSCOPY (EGD);  Surgeon: Scot Jun, MD;  Location: [facility] ENDOSCOPY;  Service: Endoscopy;  Laterality: N/A;   TONSILLECTOMY      Family Psychiatric History: I have reviewed family psychiatric history from progress note on 04/20/2018.  Family History:  Family History  Problem Relation Age of Onset   Stroke Mother    COPD Father     Social History: I have reviewed social history from progress note on 04/20/2018. Social History   Socioeconomic History   Marital status: Married    Spouse name: gail   Number of children: 2   Years of education: Not on file   Highest education level: Master's degree (e.g., MA, MS, MEng, MEd, MSW, MBA)  Occupational History   Not on file  Tobacco Use   Smoking status: Former    Types: Cigarettes    Quit date: 04/20/1958    Years since quitting: 64.4   Smokeless tobacco: Never  Vaping Use   Vaping Use: Never used  Substance and Sexual Activity   Alcohol use: Yes    Alcohol/week: 1.0 standard drink of alcohol    Types: 1 Glasses of wine per week   Drug use: Never   Sexual activity: Not Currently  Other Topics Concern   Not on file  Social History Narrative   Not on file   Social Determinants of Health   Financial Resource Strain: Low Risk  (04/20/2018)   Overall Financial Resource Strain (CARDIA)    Difficulty of Paying Living Expenses: Not hard at all  Food Insecurity: No Food Insecurity (08/03/2022)   Hunger Vital Sign    Worried About Running Out of Food in the Last Year: Never true    Ran Out of Food in the Last Year: Never true  Transportation Needs: No Transportation Needs (08/03/2022)   PRAPARE - Administrator, Civil Service (Medical): No    Lack of Transportation (Non-Medical): No  Physical Activity: Insufficiently Active  (04/20/2018)   Exercise Vital Sign    Days of Exercise per Week: 2 days    Minutes of Exercise per Session: 60 min  Stress: No Stress Concern Present (04/20/2018)   [employer] of Occupational Health - Occupational Stress Questionnaire    Feeling of Stress : Not at all  Social Connections: Unknown (04/20/2018)   Social Connection and Isolation Panel [NHANES]    Frequency of Communication with Friends and Family: Not on file    Frequency of Social Gatherings with Friends and Family: Not on file    Attends Religious Services: More than 4 times per year    Active Member of Clubs or Organizations: Yes  Attends Banker Meetings: More than 4 times per year    Marital Status: Married    Allergies:  Allergies  Allergen Reactions   Aricept [Donepezil]     Hallucination, confusion   Ciprofloxacin Other (See Comments)   Erythromycin Swelling   Keflex [Cephalexin] Swelling   Penicillins Swelling    Has patient had a PCN reaction causing immediate rash, facial/tongue/throat swelling, SOB or lightheadedness with hypotension: Yes Has patient had a PCN reaction causing severe rash involving mucus membranes or skin necrosis: No Has patient had a PCN reaction that required hospitalization: Unknown Has patient had a PCN reaction occurring within the last 10 years: No If all of the above answers are "NO", then may proceed with Cephalosporin use.     Metabolic Disorder Labs: No results found for: "HGBA1C", "MPG" No results found for: "PROLACTIN" No results found for: "CHOL", "TRIG", "HDL", "CHOLHDL", "VLDL", "LDLCALC" Lab Results  Component Value Date   TSH 0.866 02/21/2018    Therapeutic Level Labs: No results found for: "LITHIUM" No results found for: "VALPROATE" No results found for: "CBMZ"  Current Medications: Current Outpatient Medications  Medication Sig Dispense Refill   apixaban (ELIQUIS) 2.5 MG TABS tablet Take 1 tablet by mouth every 12 (twelve) hours.      buPROPion (WELLBUTRIN) 75 MG tablet Take 150 mg by mouth daily with breakfast.     cholecalciferol (VITAMIN D3) 10 MCG (400 UNIT) TABS tablet Take 400 Units by mouth daily.     Coenzyme Q10 (CO Q 10 PO) Take 1 Dose by mouth daily.      Cyanocobalamin (B-12) 5000 MCG CAPS Take 5,000 mg by mouth daily. 30 capsule 0   docusate sodium (COLACE) 100 MG capsule Take 300 mg by mouth at bedtime.     esomeprazole (NEXIUM) 40 MG capsule Take 1 capsule by mouth daily.     finasteride (PROSCAR) 5 MG tablet Take 1 tablet (5 mg total) by mouth daily. 90 tablet 3   FLUoxetine HCl 60 MG TABS Take 1 tablet by mouth at bedtime. For depression     folic acid (FOLVITE) 1 MG tablet Take 1 tablet (1 mg total) by mouth daily. 90 tablet 1   HYDROcodone-acetaminophen (NORCO/VICODIN) 5-325 MG tablet Take 1 tablet by mouth.     lidocaine (LIDODERM) 5 % Place onto the skin.     memantine (NAMENDA) 5 MG tablet 5 mg 2 (two) times daily.     niacinamide 100 MG tablet Take 100 mg by mouth in the morning.     nitrofurantoin, macrocrystal-monohydrate, (MACROBID) 100 MG capsule Take 100 mg by mouth 2 (two) times daily.     ondansetron (ZOFRAN-ODT) 4 MG disintegrating tablet Take 1 tablet (4 mg total) by mouth every 8 (eight) hours as needed for nausea or vomiting. 20 tablet 0   OVER THE COUNTER MEDICATION Take 1 tablet by mouth daily. ZINC     simvastatin (ZOCOR) 40 MG tablet Take 40 mg by mouth daily.     tamsulosin (FLOMAX) 0.4 MG CAPS capsule Take 1 capsule (0.4 mg total) by mouth daily. 90 capsule 3   triamcinolone (KENALOG) 0.1 % paste 1 Application 2 (two) times daily.     Zinc Oxide (TRIPLE PASTE) 12.8 % ointment Apply 1 Application topically as needed for irritation. Every shift.     acetaminophen (TYLENOL) 325 MG tablet Take 2 tablets (650 mg total) by mouth every 6 (six) hours as needed for mild pain, moderate pain, fever or headache (or Fever >/=  101). (Patient not taking: Reported on 09/28/2022)     meclizine (ANTIVERT)  25 MG tablet Take 1 tablet (25 mg total) by mouth 3 (three) times daily as needed for dizziness. (Patient not taking: Reported on 09/28/2022) 30 tablet 0   QUEtiapine (SEROQUEL) 25 MG tablet Take 0.5 tablets (12.5 mg total) by mouth at bedtime as needed. For sleep, psychosis (Patient not taking: Reported on 09/28/2022) 30 tablet 0   Current Facility-Administered Medications  Medication Dose Route Frequency Provider Last Rate Last Admin   nystatin (MYCOSTATIN/NYSTOP) topical powder   Topical BID Vanna Scotland, MD         Musculoskeletal: Strength & Muscle Tone:  UTA Gait & Station:  Seated Patient leans: N/A  Psychiatric Specialty Exam: Review of Systems  Psychiatric/Behavioral:         Frustrated    There were no vitals taken for this visit.There is no height or weight on file to calculate BMI.  General Appearance: Fairly Groomed  Eye Contact:  Fair  Speech:  Normal Rate  Volume:  Normal  Mood:   Frustrated , situational  Affect:  Full Range  Thought Process:  Goal Directed and Descriptions of Associations: Intact  Orientation:  Full (Time, Place, and Person)  Thought Content: Logical   Suicidal Thoughts:  No  Homicidal Thoughts:  No  Memory:  Immediate;   Fair Recent;   Fair Remote;   limited  Judgement:  Fair  Insight:  Fair  Psychomotor Activity:  Normal  Concentration:  Concentration: Fair and Attention Span: Fair  Recall:   limited  Fund of Knowledge: Fair  Language: Fair  Akathisia:  No  Handed:  Right  AIMS (if indicated): not done  Assets:  Communication Skills Desire for Improvement Housing Social Support  ADL's:  Intact  Cognition: WNL  Sleep:  Fair   Screenings: AIMS    Flowsheet Row Office Visit from [facility]/05/2022 in [facility]  AIMS Total Score 0      GAD-7    Flowsheet Row Office Visit from 07/30/2022 in [facility]  Total GAD-7 Score 1      Mini-Mental     Flowsheet Row Office Visit from 07/30/2022 in [facility]  Total Score (max 30 points ) 29      PHQ2-9    Flowsheet Row Office Visit from 07/30/2022 in [facility] Video Visit from 03/05/2022 in [facility] Office Visit from 07/21/2021 in [facility] Office Visit from 12/10/2020 in [facility]  PHQ-2 Total Score 0 0 0 0  PHQ-9 Total Score 1 -- 0 --      Flowsheet Row Video Visit from 09/28/2022 in [facility] ED to Hosp-Admission (Discharged) from 08/02/2022 in [facility] 1C MEDICAL TELEMETRY ED from 08/01/2022 in [facility] Emergency Department at [facility]  C-SSRS RISK CATEGORY No Risk No Risk No Risk        Assessment and Plan: PHABIAN RISCO [name] a 82 year old Caucasian male, lives in [place], has a history of depression, sleep problems, recent fall status post recurrent UTIs, L2 fracture, which does limit his ability to be active and does have an impact on his mood although patient with good social support system, access to health care, physical therapy, currently managing well on the current medication regimen.  Plan as noted below.  Plan MDD in full remission Continue Wellbutrin XL 150 mg p.o. daily.  Although per review of [facility] hospitalist discharge summary dated 08/06/2022 Wellbutrin was reduced to 75 mg p.o. daily, patient continues to take the 150 mg p.o. daily and would like to stay on this dosage. Prozac 60 mg p.o. daily  Insomnia-improving Patient will need sufficient pain management. Does have Seroquel 12.5-25 mg at bedtime as needed available.  They have not been using it.  [facility]-patient to continue follow-up with neurology. MMSE-29 out of 30-07/30/2022  Discussed referral to psychotherapist-patient as  well as spouse declines.  Collateral information obtained from spouse as noted above as well as reviewed notes per [facility] hospitalist-08/06/2022 as noted above.  Follow-up in clinic in 2 to 3 months or sooner if needed. Collaboration of Care: Collaboration of Care: Other reviewed notes per [facility] hospitalist as noted above.  Patient/Guardian was advised Release of Information must be obtained prior to any record release in order to collaborate their care with an outside provider. Patient/Guardian was advised if they have not already done so to contact the registration department to sign all necessary forms in order for [place] to release information regarding their care.   Consent: Patient/Guardian gives verbal consent for treatment and assignment of benefits for services provided during this visit. Patient/Guardian expressed understanding and agreed to proceed.   This note was generated in part or whole with voice recognition software. Voice recognition is usually quite accurate but there are transcription errors that can and very often do occur. I apologize for any typographical errors that were not detected and corrected.    Jomarie Longs, MD 09/28/2022, 5:26 PM

## 2022-10-05 NOTE — Progress Notes (Unsigned)
10/06/2022 4:05 PM   Mark [name]. Jul 03, 1940 [id number]  Referring provider: Barbette Reichmann, MD [address] [facility] [place],  [place] [address]  Urological history: 1. 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 BPH with LU TS  -aged out of prostate cancer screening  -Tamsulosin 0.4 mg daily and finasteride 5 mg daily   3.  Phimosis  -Mycolog cream    Chief Complaint  Patient presents with   Benign Prostatic Hypertrophy    HPI: Mark [name]. is a 82 y.o. male who presents today for Two month follow up on recheck symptoms and UA after an UTI.  Previous records reviewed.   At his visit on 07/30/2022, Mark [name] and his wife presented to our office asking if he could be seen to rule out possible UTI for his symptoms of hallucination, being panicked and urinary frequency.   Mark [name] states he has noticed a little fogginess, but he denies any urinary symptoms.  Mark [name] states that this is only way that she knows he has urinary tract infection is when he starts having issues with concentration and not acting like himself.  UA yellow slightly cloudy, specific gravity 1.025, trace blood, pH 6.0, nitrate positive, leukocyte +1, 11-30 WBCs, 3-10 RBCs, 0-10 epithelial cells, hyaline cast present, mucus threads present and many bacteria.  Urine culture positive for staphylococcus epidermidis.  PVR 5 mL   He is having issues with back pain after suffering a fall.  Patient denies any modifying or aggravating factors.  Patient denies any recent UTI's, gross hematuria, dysuria or suprapubic/flank pain.  Patient denies any fevers, chills, nausea or vomiting.   UA amber clear, trace ketone, specific gravity greater than 1.030, trace heme, trace protein,   PMH: Past Medical History:  Diagnosis Date   Anxiety    Atrial fibrillation (HCC)    Back pain, chronic    Cancer (HCC)    skin   Colon polyp    Cyst of left  kidney    Dementia (HCC)    GERD (gastroesophageal reflux disease)    Heart murmur    Hyperlipemia    Mitral valve prolapse    Neuromuscular disorder (HCC)    Neuropathy    Osteoarthritis    Pulmonary emboli (HCC)    Spinal stenosis    Thyroid nodule    Vertigo     Surgical History: Past Surgical History:  Procedure Laterality Date   APPENDECTOMY     BACK SURGERY     COLONOSCOPY N/A 08/15/2014   Procedure: COLONOSCOPY;  Surgeon: Scot Jun, MD;  Location: [facility] ENDOSCOPY;  Service: Endoscopy;  Laterality: N/A;   ESOPHAGOGASTRODUODENOSCOPY N/A 08/15/2014   Procedure: ESOPHAGOGASTRODUODENOSCOPY (EGD);  Surgeon: Scot Jun, MD;  Location: [facility] ENDOSCOPY;  Service: Endoscopy;  Laterality: N/A;   TONSILLECTOMY      Home Medications:  Allergies as of 10/06/2022       Reactions   Aricept [donepezil]    Hallucination, confusion   Ciprofloxacin Other (See Comments)   Erythromycin Swelling   Keflex [cephalexin] Swelling   Penicillins Swelling   Has patient had a PCN reaction causing immediate rash, facial/tongue/throat swelling, SOB or lightheadedness with hypotension: Yes Has patient had a PCN reaction causing severe rash involving mucus membranes or skin necrosis: No Has patient had a PCN reaction that required hospitalization: Unknown Has patient had a PCN reaction occurring within the  last 10 years: No If all of the above answers are "NO", then may proceed with Cephalosporin use.        Medication List        Accurate as of October 06, 2022  4:05 PM. If you have any questions, ask your nurse or doctor.          STOP taking these medications    acetaminophen 325 MG tablet Commonly known as: TYLENOL Stopped by: Mark [name]   meclizine 25 MG tablet Commonly known as: ANTIVERT Stopped by: Mark [name]   QUEtiapine 25 MG tablet Commonly known as: SEROquel Stopped by: Mark [name]       TAKE these medications    B-12 5000 MCG Caps Take  5,000 mg by mouth daily.   buPROPion 75 MG tablet Commonly known as: WELLBUTRIN Take 150 mg by mouth daily with breakfast.   cholecalciferol 10 MCG (400 UNIT) Tabs tablet Commonly known as: VITAMIN D3 Take 400 Units by mouth daily.   CO Q 10 PO Take 1 Dose by mouth daily.   docusate sodium 100 MG capsule Commonly known as: COLACE Take 300 mg by mouth at bedtime.   Eliquis 2.5 MG Tabs tablet Generic drug: apixaban Take 1 tablet by mouth every 12 (twelve) hours.   esomeprazole 40 MG capsule Commonly known as: NEXIUM Take 1 capsule by mouth daily.   finasteride 5 MG tablet Commonly known as: PROSCAR Take 1 tablet (5 mg total) by mouth daily.   FLUoxetine HCl 60 MG Tabs Take 1 tablet by mouth at bedtime. For depression   folic acid 1 MG tablet Commonly known as: FOLVITE Take 1 tablet (1 mg total) by mouth daily.   HYDROcodone-acetaminophen 5-325 MG tablet Commonly known as: NORCO/VICODIN Take 1 tablet by mouth.   memantine 5 MG tablet Commonly known as: NAMENDA 5 mg 2 (two) times daily.   niacinamide 100 MG tablet Take 100 mg by mouth in the morning.   nitrofurantoin (macrocrystal-monohydrate) 100 MG capsule Commonly known as: MACROBID Take 100 mg by mouth 2 (two) times daily.   ondansetron 4 MG disintegrating tablet Commonly known as: ZOFRAN-ODT Take 1 tablet (4 mg total) by mouth every 8 (eight) hours as needed for nausea or vomiting.   OVER THE COUNTER MEDICATION Take 1 tablet by mouth daily. ZINC   simvastatin 40 MG tablet Commonly known as: ZOCOR Take 40 mg by mouth daily.   tamsulosin 0.4 MG Caps capsule Commonly known as: FLOMAX Take 1 capsule (0.4 mg total) by mouth daily.   triamcinolone 0.1 % paste Commonly known as: KENALOG 1 Application 2 (two) times daily.   Zinc Oxide 12.8 % ointment Commonly known as: TRIPLE PASTE Apply 1 Application topically as needed for irritation. Every shift.        Allergies:  Allergies  Allergen  Reactions   Aricept [Donepezil]     Hallucination, confusion   Ciprofloxacin Other (See Comments)   Erythromycin Swelling   Keflex [Cephalexin] Swelling   Penicillins Swelling    Has patient had a PCN reaction causing immediate rash, facial/tongue/throat swelling, SOB or lightheadedness with hypotension: Yes Has patient had a PCN reaction causing severe rash involving mucus membranes or skin necrosis: No Has patient had a PCN reaction that required hospitalization: Unknown Has patient had a PCN reaction occurring within the last 10 years: No If all of the above answers are "NO", then may proceed with Cephalosporin use.     Family History: Family History  Problem Relation Age of  Onset   Stroke Mother    COPD Father     Social History:  reports that he quit smoking about 64 years ago. His smoking use included cigarettes. He has never used smokeless tobacco. He reports current alcohol use of about 1.0 standard drink of alcohol per week. He reports that he does not use drugs.  ROS: Pertinent ROS in HPI  Physical Exam: BP 118/70   Pulse 90   Ht 6\' 4"  (1.93 m)   Wt 240 lb (108.9 kg)   BMI 29.21 kg/m   Constitutional:  Well nourished. Alert and oriented, No acute distress. HEENT: [place] AT, moist mucus membranes.  Trachea midline Cardiovascular: No clubbing, cyanosis, or edema. Respiratory: Normal respiratory effort, no increased work of breathing. Neurologic: Grossly intact, no focal deficits, moving all 4 extremities. Psychiatric: Normal mood and affect.   Laboratory Data: Component     Latest Ref Rng 08/04/2022  Sodium     135 - 145 mmol/L 135   Potassium     3.5 - 5.1 mmol/L 3.8   Chloride     98 - 111 mmol/L 102   CO2     22 - 32 mmol/L 24   Glucose     70 - 99 mg/dL 96   BUN     8 - 23 mg/dL 18   Creatinine     5.40 - 1.24 mg/dL 9.81   Calcium     8.9 - 10.3 mg/dL 8.4 (L)   Anion gap     5 - 15  9   GFR, Estimated     >60 mL/min >60     Legend: (L)  Low  Urinalysis Results for orders placed or performed in visit on 10/06/22  Microscopic Examination   Urine  Result Value Ref Range   WBC, UA 0-5 0 - 5 /hpf   RBC, Urine 0-2 0 - 2 /hpf   Epithelial Cells (non renal) 0-10 0 - 10 /hpf   Mucus, UA Present (A) Not Estab.   Bacteria, UA Many (A) None seen/Few  Urinalysis, Complete  Result Value Ref Range   Specific Gravity, UA >1.030 (H) 1.005 - 1.030   pH, UA 5.5 5.0 - 7.5   Color, UA Amber (A) Yellow   Appearance Ur Clear Clear   Leukocytes,UA Negative Negative   Protein,UA Trace (A) Negative/Trace   Glucose, UA Negative Negative   Ketones, UA Trace (A) Negative   RBC, UA Trace (A) Negative   Bilirubin, UA Negative Negative   Urobilinogen, Ur 0.2 0.2 - 1.0 mg/dL   Nitrite, UA Negative Negative   Microscopic Examination See below:     I have reviewed the labs.   Pertinent Imaging:   Assessment & Plan:    1. Suspected UTI -UA benign  2. Microscopic hematuria -resolved  Return if symptoms worsen or fail to improve.  These notes generated with voice recognition software. I apologize for typographical errors.  Cloretta Ned  [facility] [address]  [address] [place], [place] [address] [phone]

## 2022-10-06 ENCOUNTER — Encounter: Payer: Self-pay | Admitting: Urology

## 2022-10-06 ENCOUNTER — Ambulatory Visit: Payer: Medicare Other | Admitting: Urology

## 2022-10-06 VITALS — BP 118/70 | HR 90 | Ht 76.0 in | Wt 240.0 lb

## 2022-10-06 DIAGNOSIS — R350 Frequency of micturition: Secondary | ICD-10-CM | POA: Diagnosis not present

## 2022-10-06 DIAGNOSIS — R4182 Altered mental status, unspecified: Secondary | ICD-10-CM | POA: Diagnosis not present

## 2022-10-06 DIAGNOSIS — R443 Hallucinations, unspecified: Secondary | ICD-10-CM | POA: Diagnosis not present

## 2022-10-06 DIAGNOSIS — R319 Hematuria, unspecified: Secondary | ICD-10-CM

## 2022-10-06 LAB — URINALYSIS, COMPLETE
Bilirubin, UA: NEGATIVE
Glucose, UA: NEGATIVE
Leukocytes,UA: NEGATIVE
Nitrite, UA: NEGATIVE
Specific Gravity, UA: >1.03 — ABNORMAL HIGH (ref 1.005–1.030)
Urobilinogen, Ur: 0.2 mg/dL (ref 0.2–1.0)
pH, UA: 5.5 (ref 5.0–7.5)

## 2022-10-06 LAB — MICROSCOPIC EXAMINATION

## 2022-10-12 ENCOUNTER — Inpatient Hospital Stay
Admission: RE | Admit: 2022-10-12 | Discharge: 2022-10-12 | Disposition: A | Discharge status: Home / Self Care | Payer: Self-pay | Source: Ambulatory Visit | Attending: Neurosurgery | Admitting: Neurosurgery

## 2022-10-12 ENCOUNTER — Other Ambulatory Visit: Payer: Self-pay

## 2022-10-12 DIAGNOSIS — Z049 Encounter for examination and observation for unspecified reason: Secondary | ICD-10-CM

## 2022-10-12 NOTE — Progress Notes (Unsigned)
Referring Physician:  Barbette Reichmann, MD [address] [facility] [place],  [place] [address]  Primary Physician:  Barbette Reichmann, MD  History of Present Illness: 10/13/2022 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   History of lumbar surgery years ago (decompression at L5-S1?).   Has been seeing [facility] for L2 compression fracture s/p fall on 08/02/22.   He continues with mid to lower back pain that is constant with standing/walking. No leg pain. He has brace to wear when he is up. He is still unable to stand and walk due to pain. No pain with sitting. He has known neuropathy from knees down to feet. No weakness in his legs.   His mobility is very limited due to pain.   He is taking prn norco and neurontin.   He is taking ELIQUIS.   Bowel/Bladder Dysfunction: none  Conservative measures:  Physical therapy: currently participating in home health PT, has repeat eval today.  Multimodal medical therapy including regular antiinflammatories: tramadol, norco, gabapentin, tylenol, hydrocodone Injections: No epidural steroid injections  Past Surgery:  History of lumbar surgery years ago (decompression at L5-S1?)  Review of Systems:  A 10 point review of systems is negative, except for the pertinent positives and negatives detailed in the HPI.  Past Medical History: Past Medical History:  Diagnosis Date   Anxiety    Atrial fibrillation (HCC)    Back pain, chronic    Cancer (HCC)    skin   Colon polyp    Cyst of left kidney    Dementia (HCC)    GERD (gastroesophageal reflux disease)    Heart murmur    Hyperlipemia    Mitral valve prolapse    Neuromuscular disorder (HCC)    Neuropathy    Osteoarthritis    Pulmonary emboli (HCC)    Spinal stenosis    Thyroid nodule    Vertigo     Past Surgical History: Past Surgical History:  Procedure Laterality Date    APPENDECTOMY     BACK SURGERY     COLONOSCOPY N/A 08/15/2014   Procedure: COLONOSCOPY;  Surgeon: Scot Jun, MD;  Location[facility] ENDOSCOPY;  Service: Endoscopy;  Laterality: N/A;   ESOPHAGOGASTRODUODENOSCOPY N/A 08/15/2014   Procedure: ESOPHAGOGASTRODUODENOSCOPY (EGD);  Surgeon: Scot Jun, MD;  Location: [facility] ENDOSCOPY;  Service: Endoscopy;  Laterality: N/A;   TONSILLECTOMY      Allergies: Allergies as of 10/13/2022 - Review Complete 10/06/2022  Allergen Reaction Noted   Aricept [donepezil]  03/05/2022   Ciprofloxacin Other (See Comments) 04/12/2018   Erythromycin Swelling 08/02/2014   Keflex [cephalexin] Swelling 08/14/2014   Penicillins Swelling 08/02/2014    Medications: Outpatient Encounter Medications as of 10/13/2022  Medication Sig   apixaban (ELIQUIS) 2.5 MG TABS tablet Take 1 tablet by mouth every 12 (twelve) hours.   buPROPion (WELLBUTRIN) 75 MG tablet Take 150 mg by mouth daily with breakfast.   cholecalciferol (VITAMIN D3) 10 MCG (400 UNIT) TABS tablet Take 400 Units by mouth daily.   Coenzyme Q10 (CO Q 10 PO) Take 1 Dose by mouth daily.    Cyanocobalamin (B-12) 5000 MCG CAPS Take 5,000 mg by mouth daily.   docusate sodium (COLACE) 100 MG capsule Take 300 mg by mouth at bedtime.   esomeprazole (NEXIUM) 40 MG capsule Take 1 capsule by mouth daily.   finasteride (PROSCAR) 5 MG tablet Take 1 tablet (5 mg total) by mouth daily.  FLUoxetine HCl 60 MG TABS Take 1 tablet by mouth at bedtime. For depression   folic acid (FOLVITE) 1 MG tablet Take 1 tablet (1 mg total) by mouth daily.   HYDROcodone-acetaminophen (NORCO/VICODIN) 5-325 MG tablet Take 1 tablet by mouth.   memantine (NAMENDA) 5 MG tablet 5 mg 2 (two) times daily.   niacinamide 100 MG tablet Take 100 mg by mouth in the morning.   nitrofurantoin, macrocrystal-monohydrate, (MACROBID) 100 MG capsule Take 100 mg by mouth 2 (two) times daily.   ondansetron (ZOFRAN-ODT) 4 MG disintegrating tablet Take 1 tablet  (4 mg total) by mouth every 8 (eight) hours as needed for nausea or vomiting.   OVER THE COUNTER MEDICATION Take 1 tablet by mouth daily. ZINC   simvastatin (ZOCOR) 40 MG tablet Take 40 mg by mouth daily.   tamsulosin (FLOMAX) 0.4 MG CAPS capsule Take 1 capsule (0.4 mg total) by mouth daily.   triamcinolone (KENALOG) 0.1 % paste 1 Application 2 (two) times daily.   Zinc Oxide (TRIPLE PASTE) 12.8 % ointment Apply 1 Application topically as needed for irritation. Every shift.   Facility-Administered Encounter Medications as of 10/13/2022  Medication   nystatin (MYCOSTATIN/NYSTOP) topical powder    Social History: Social History   Tobacco Use   Smoking status: Former    Current packs/day: 0.00    Types: Cigarettes    Quit date: 04/20/1958    Years since quitting: 64.5   Smokeless tobacco: Never  Vaping Use   Vaping status: Never Used  Substance Use Topics   Alcohol use: Yes    Alcohol/week: 1.0 standard drink of alcohol    Types: 1 Glasses of wine per week   Drug use: Never    Family Medical History: Family History  Problem Relation Age of Onset   Stroke Mother    COPD Father     Physical Examination: Vitals:   10/13/22 1108  BP: 110/60    General: Patient is well developed, well nourished, calm, collected, and in no apparent distress. Attention to examination is appropriate.  Respiratory: Patient is breathing without any difficulty.   NEUROLOGICAL:     Awake, alert, oriented to person, place, and time.  Speech is clear and fluent. Fund of knowledge is appropriate.   Cranial Nerves: Pupils equal round and reactive to light.  Facial tone is symmetric.    Mild mid to lower posterior lumbar tenderness.   No abnormal lesions on exposed skin.   Strength: Side Biceps Triceps Deltoid Interossei Grip Wrist Ext. Wrist Flex.  R 5 5 5 5 5 5 5   L 5 5 5 5 5 5 5    Side Iliopsoas Quads Hamstring PF DF EHL  R 5 5 5 5 5 5   L 5 5 5 5 5 5    Reflexes are 2+ and symmetric at  the biceps, brachioradialis, patella and achilles.   Hoffman's is absent.   Bilateral upper and lower extremity sensation is intact to light touch.     Gait not tested. He is in a WC.   Medical Decision Making  Imaging: Lumbar xrays dated 10/08/22:  Compression fracture L2  No xray report from above xrays.    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  Mild anterior wedging/superior endplate compression deformity of L2 with endplate edema, either acute or subacute. No extension of edema into the posterior elements are bowing of the posterior cortex. No retropulsion.  Multilevel spondylitic changes in lumbar spine. Moderate canal stenosis at L2-L3. Moderate bilateral foraminal stenosis at L2-L3 and L4-L5. Lateral recess stenosis at L5-S1 with abutment of the descending S1 nerve roots.  Posterior decompression L5-S1.   I have personally reviewed the images and agree with the above interpretation. MRI report scanned under media.   Above imaging reviewed with Dr. Myer Haff.   Assessment and Plan: Mr. Calico is a pleasant 82 y.o. male has  a history of lumbar surgery years ago (decompression at L5-S1?).   He had fall on 08/02/22 and was found to have L2 compression fracture.   He continues with mid to lower back pain that is constant with standing/walking. No leg pain. He is still unable to stand and walk due to pain. No pain with sitting.   Above imaging shows acute/subacute L2 compression fracture with no retropulsion. This is likely his pain generator.   Treatment options discussed with patient and his wife. Following plan made:   - Referral to IR for evaluation of kyphoplasty at L2.  - Will order DEXA scan- will verify this needs done with IR and call wife to confirm.  - He can continue with back brace when up and walking.  - No bending, twisting, or lifting.  - No follow up   made for now, but will likely touch base once/if kyphoplasty is done.   I spent a total of 35 minutes in face-to-face and non-face-to-face activities related to this patient's care today including review of outside records, review of imaging, review of symptoms, physical exam, discussion of differential diagnosis, discussion of treatment options, and documentation.   Thank you for involving me in the care of this patient.   Drake Leach PA-C Dept. of Neurosurgery

## 2022-10-13 ENCOUNTER — Ambulatory Visit (INDEPENDENT_AMBULATORY_CARE_PROVIDER_SITE_OTHER): Payer: Medicare Other | Admitting: Orthopedic Surgery

## 2022-10-13 ENCOUNTER — Encounter: Payer: Self-pay | Admitting: Orthopedic Surgery

## 2022-10-13 VITALS — BP 110/60 | Ht 76.0 in | Wt 230.0 lb

## 2022-10-13 DIAGNOSIS — W19XXXA Unspecified fall, initial encounter: Secondary | ICD-10-CM

## 2022-10-13 DIAGNOSIS — S32020A Wedge compression fracture of second lumbar vertebra, initial encounter for closed fracture: Secondary | ICD-10-CM | POA: Diagnosis not present

## 2022-10-13 NOTE — Patient Instructions (Signed)
It was so nice to see you today. Thank you so much for coming in.    You have a compression fracture at L2 and this likely is causing most of your pain. You also have some wear and tear (arthritis in your back).  You can continue to wear the back brace if you are up and walking.   Avoid any bending, twisting, or lifting.   I put in a referral for you to see Interventional Radiology to discuss a kyphoplasty procedure (injection of cement into the bone). They will call you to schedule this.   I did put in an order for a DEXA scan as well. Call [facility] to get this scheduled [phone]. I will double check this needs to be done and let you know.   Please do not hesitate to call if you have any questions or concerns. You can also message me in MyChart.   Drake Leach PA-C [phone]

## 2022-10-14 ENCOUNTER — Telehealth: Payer: Self-pay | Admitting: Orthopedic Surgery

## 2022-10-14 NOTE — Telephone Encounter (Signed)
Sorry- I just heard back. He does need to get the DEXA done. She should call to get this done asap.   They should be calling him from IR in next day or so.

## 2022-10-14 NOTE — Telephone Encounter (Signed)
Dondra Spry is calling that they have not heard from the doctor that does the kyphoplasty to schedule an appt. Do you recommend that she go ahead and schedule the patient his bone scan?

## 2022-10-14 NOTE — Telephone Encounter (Signed)
Mark [name] has been notified.

## 2022-10-15 ENCOUNTER — Ambulatory Visit
Admission: RE | Admit: 2022-10-15 | Discharge: 2022-10-15 | Disposition: A | Discharge status: Home / Self Care | Payer: Medicare Other | Source: Ambulatory Visit | Attending: Orthopedic Surgery | Admitting: Orthopedic Surgery

## 2022-10-15 VITALS — BP 137/71 | HR 86 | Temp 98.5°F | Resp 15

## 2022-10-15 DIAGNOSIS — S32020K Wedge compression fracture of second lumbar vertebra, subsequent encounter for fracture with nonunion: Secondary | ICD-10-CM

## 2022-10-15 DIAGNOSIS — S32020A Wedge compression fracture of second lumbar vertebra, initial encounter for closed fracture: Secondary | ICD-10-CM

## 2022-10-15 DIAGNOSIS — S32020G Wedge compression fracture of second lumbar vertebra, subsequent encounter for fracture with delayed healing: Secondary | ICD-10-CM

## 2022-10-15 HISTORY — PX: IR RADIOLOGIST EVAL & MGMT: IMG5224

## 2022-10-15 NOTE — H&P (Signed)
Interventional Radiology - Clinic Visit, Initial H&P    Referring Provider: Drake Leach, PA-C  Reason for Visit: L2 compression fracture     History of Present Illness  Mark Gatt. is a 82 y.o. 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  The patient reports onset of acute lower back pain at the beginning of May 2024 after experiencing a fall.  Patient was ambulatory with walker at baseline.  Subsequent imaging including lumbar spine radiograph and CT of the lumbar spine on May 12 and Aug 03, 2022, respectively, demonstrated suspected L2 compression fracture.  This was confirmed on L-spine MRI on September 17, 2022 confirmed the presence of acute to subacute compression deformity of the L2 vertebral body.  The patient was then seen in the neurosurgery clinic most recently on July 23rd.  The patient has been under conservative therapy measures including bracing and prescription pain medication (hydrocodone/Tylenol), and has additionally been involved with home health physical therapy.  Unfortunately he continues to have severe lower back pain, which she rates at 8/10 at baseline.  There is minimal improvement in his back pain with the prescription pain medicine to 6/10.  He rates significant disability on the [employer] disability questionnaire with 17/24 positive.  Most significantly, he is gone from being able to ambulate with a walker to now being bedridden/needing a wheelchair for mobility.  This has caused significant decrease in his quality of life, as he is unable to stand or walk.     Additional Past Medical History Past Medical History:  Diagnosis Date   Anxiety    Atrial fibrillation (HCC)    Back pain, chronic    Cancer (HCC)    skin   Colon polyp    Cyst of left kidney    Dementia (HCC)    GERD (gastroesophageal reflux disease)    Heart murmur    Hyperlipemia    Mitral  valve prolapse    Neuromuscular disorder (HCC)    Neuropathy    Osteoarthritis    Pulmonary emboli (HCC)    Spinal stenosis    Thyroid nodule    Vertigo      Surgical History  Past Surgical History:  Procedure Laterality Date   APPENDECTOMY     BACK SURGERY     COLONOSCOPY N/A 08/15/2014   Procedure: COLONOSCOPY;  Surgeon: Scot Jun, MD;  Location: [facility] ENDOSCOPY;  Service: Endoscopy;  Laterality: N/A;   ESOPHAGOGASTRODUODENOSCOPY N/A 08/15/2014   Procedure: ESOPHAGOGASTRODUODENOSCOPY (EGD);  Surgeon: Scot Jun, MD;  Location: [facility] ENDOSCOPY;  Service: Endoscopy;  Laterality: N/A;   TONSILLECTOMY       Medications  I have reviewed the current medication list. Refer to chart for details. Current Outpatient Medications  Medication Instructions   apixaban (ELIQUIS) 2.5 MG TABS tablet 1 tablet, Oral, Every 12 hours   B-12 5,000 mg, Oral, Daily   buPROPion (WELLBUTRIN) 150 mg, Oral, Daily with breakfast   cholecalciferol (VITAMIN D3) 400 Units, Oral, Daily   Coenzyme Q10 (CO Q 10 PO) 1 Dose, Oral, Daily   docusate sodium (COLACE) 300 mg, Oral, Nightly   esomeprazole (NEXIUM) 40 MG capsule 1 capsule, Oral, Daily   finasteride (PROSCAR) 5 mg, Oral, Daily   FLUoxetine HCl 60 MG TABS 1 tablet, Oral, Daily at bedtime, For depression    folic acid (FOLVITE) 1 mg, Oral, Daily   gabapentin (NEURONTIN) 300 MG capsule Take 300  mg daily for a week then increase to 300 mg twice a day   HYDROcodone-acetaminophen (NORCO/VICODIN) 5-325 MG tablet 1 tablet, Oral   memantine (NAMENDA) 5 mg, 2 times daily   niacinamide 100 mg, Oral, Every morning   nitrofurantoin (macrocrystal-monohydrate) (MACROBID) 100 mg, Oral, 2 times daily   ondansetron (ZOFRAN-ODT) 4 mg, Oral, Every 8 hours PRN   OVER THE COUNTER MEDICATION 1 tablet, Daily   simvastatin (ZOCOR) 40 mg, Oral, Daily   tamsulosin (FLOMAX) 0.4 mg, Oral, Daily   triamcinolone (KENALOG) 0.1 % paste 1 Application, 2 times daily       Allergies Allergies  Allergen Reactions   Aricept [Donepezil]     Hallucination, confusion   Ciprofloxacin Other (See Comments)   Erythromycin Swelling   Keflex [Cephalexin] Swelling   Penicillins Swelling    Has patient had a PCN reaction causing immediate rash, facial/tongue/throat swelling, SOB or lightheadedness with hypotension: Yes Has patient had a PCN reaction causing severe rash involving mucus membranes or skin necrosis: No Has patient had a PCN reaction that required hospitalization: Unknown Has patient had a PCN reaction occurring within the last 10 years: No If all of the above answers are "NO", then may proceed with Cephalosporin use.    Does patient have contrast allergy: No     Physical Exam Current Vitals Temp: 98.5 F (36.9 C) (Temp Source: Oral)  Pulse Rate: 86  Resp: 15  BP: 137/71  SpO2: 95 %        There is no height or weight on file to calculate BMI.  General: Alert and answers questions appropriately. In wheelchair.  HEENT: Normocephalic, atraumatic. Conjunctivae normal without scleral icterus. Cardiac: Regular rate. No dependent edema. Pulmonary: Normal work of breathing. On room air. Back: TTP in lower back.    Pertinent Lab Results    Latest Ref Rng & Units 08/04/2022    5:14 AM 08/03/2022    4:22 AM 08/02/2022    6:08 PM  CBC  WBC 4.0 - 10.5 K/uL 8.8  9.0  10.7   Hemoglobin 13.0 - 17.0 g/dL 78.2  95.6  21.3   Hematocrit 39.0 - 52.0 % 40.2  40.5  44.7   Platelets 150 - 400 K/uL 188  200  225       Latest Ref Rng & Units 08/04/2022    5:14 AM 08/03/2022    4:22 AM 08/02/2022    6:08 PM  CMP  Glucose 70 - 99 mg/dL 96  87  086   BUN 8 - 23 mg/dL 18  19  19    Creatinine 0.61 - 1.24 mg/dL 5.78  4.69  6.29   Sodium 135 - 145 mmol/L 135  138  137   Potassium 3.5 - 5.1 mmol/L 3.8  3.9  4.2   Chloride 98 - 111 mmol/L 102  105  102   CO2 22 - 32 mmol/L 24  25  22    Calcium 8.9 - 10.3 mg/dL 8.4  8.9  9.2   Total Protein 6.5 - 8.1 g/dL   6.6  7.6   Total Bilirubin 0.3 - 1.2 mg/dL  1.1  0.9   Alkaline Phos 38 - 126 U/L  58  66   AST 15 - 41 U/L  21  25   ALT 0 - 44 U/L  14  16       Imaging:  As reviewed in the HPI     Assessment & Plan:   Patient has suffered subacute osteoporotic fracture  of the L2 vertebra. Fracture after low level traumatic event is an osteoporotic-defining event.   History and exam have demonstrated the following:  Acute/Subacute fracture by imaging dated 09/17/2022, Pain on exam concordant with level of fracture, Failure of conservative therapy (including home health PT and bracing)and pain refractory to narcotic pain mediation, and Significant disability on the [employer] Disability Questionnaire with 17/24 positive symptoms, reflecting significant impact/impairment of (ADLs)   ICD-10-CM Codes that Support Medical Necessity ([email or web address]at.[email or web address]?[email or web address])  M80.08XA    Age-related osteoporosis with current pathological fracture, vertebra(e), initial encounter for fracture  S32.020A    Wedge compression fracture of second lumbar vertebra, initial encounter for closed fracture    Plan:  L2 vertebral body augmentation with balloon kyphoplasty  DEXA scan  Post-procedure disposition: outpatient DRI-A  Medication holds: Eliquis   The patient has suffered a fracture of the L2 vertebral body. It is recommended that patients aged 65 years or older be evaluated for possible testing or treatment of osteoporosis. A copy of this consult report is sent to the patient's referring physician.  Advanced Care Plan: The patient did not want to provide an Advanced Care Plan at the time of this visit     Total time spent on today's visit was over 60 Minutes, including both face-to-face time and non face-to-face time, personally spent on review of chart (including labs and relevant imaging), discussing further workup and treatment options, referral to  specialist if needed, reviewing outside records if pertinent, answering patient questions, and coordinating care regarding L2 fracture as well as management strategy.       Olive Bass, MD  Vascular and Interventional Radiology 10/15/2022 1:57 PM

## 2022-10-16 ENCOUNTER — Other Ambulatory Visit: Payer: Self-pay | Admitting: Orthopedic Surgery

## 2022-10-16 DIAGNOSIS — M8000XA Age-related osteoporosis with current pathological fracture, unspecified site, initial encounter for fracture: Secondary | ICD-10-CM

## 2022-10-16 DIAGNOSIS — S32020A Wedge compression fracture of second lumbar vertebra, initial encounter for closed fracture: Secondary | ICD-10-CM

## 2022-10-22 ENCOUNTER — Ambulatory Visit
Admission: RE | Admit: 2022-10-22 | Discharge: 2022-10-22 | Disposition: A | Discharge status: Home / Self Care | Payer: Medicare Other | Source: Ambulatory Visit | Attending: Orthopedic Surgery | Admitting: Orthopedic Surgery

## 2022-10-22 DIAGNOSIS — S32020A Wedge compression fracture of second lumbar vertebra, initial encounter for closed fracture: Secondary | ICD-10-CM

## 2022-10-22 DIAGNOSIS — M8000XA Age-related osteoporosis with current pathological fracture, unspecified site, initial encounter for fracture: Secondary | ICD-10-CM

## 2022-10-22 HISTORY — PX: IR KYPHO LUMBAR INC FX REDUCE BONE BX UNI/BIL CANNULATION INC/IMAGING: IMG5519

## 2022-10-22 MED ORDER — VANCOMYCIN HCL IN DEXTROSE 1-5 GM/200ML-% IV SOLN
1000.0000 mg | INTRAVENOUS | Status: AC
  Administered 2022-10-22: 1000 mg via INTRAVENOUS

## 2022-10-22 MED ORDER — KETOROLAC TROMETHAMINE 30 MG/ML IJ SOLN
30.0000 mg | Freq: Once | INTRAMUSCULAR | Status: AC
  Administered 2022-10-22: 30 mg via INTRAVENOUS

## 2022-10-22 MED ORDER — FENTANYL CITRATE (PF) 100 MCG/2ML IJ SOLN
INTRAMUSCULAR | Status: AC | PRN
  Administered 2022-10-22 (×4): 25 ug via INTRAVENOUS

## 2022-10-22 MED ORDER — MIDAZOLAM HCL 2 MG/2ML IJ SOLN
INTRAMUSCULAR | Status: AC | PRN
  Administered 2022-10-22: 1 mg via INTRAVENOUS
  Administered 2022-10-22 (×2): .5 mg via INTRAVENOUS
  Administered 2022-10-22: 1 mg via INTRAVENOUS

## 2022-10-22 MED ORDER — FENTANYL CITRATE PF 50 MCG/ML IJ SOSY: 25.0000 ug | PREFILLED_SYRINGE | INTRAMUSCULAR | Status: DC | PRN

## 2022-10-22 MED ORDER — MIDAZOLAM HCL 2 MG/2ML IJ SOLN: 1.0000 mg | INTRAMUSCULAR | Status: DC | PRN

## 2022-10-22 MED ORDER — SODIUM CHLORIDE 0.9 % IV SOLN
INTRAVENOUS | Status: DC
  Administered 2022-10-22: 250 mL via INTRAVENOUS

## 2022-10-22 NOTE — Discharge Instructions (Addendum)
Kyphoplasty Post Procedure Discharge Instructions  May resume a regular diet and any medications that you routinely take (including pain medications). However, if you are taking Aspirin or an anticoagulant/blood thinner you will be told when you can resume taking these by the healthcare provider. No driving day of procedure. The day of your procedure take it easy. You may use an ice pack as needed to injection sites on back.  Ice to back 30 minutes on and 30 minutes off, as needed. May remove bandaids tomorrow after taking a shower. Replace daily with a clean bandaid until healed.  Do not lift anything heavier than a milk jug for 1-2 weeks or determined by your physician.  Follow up with your physician in 2 weeks.    Please contact our office at [phone] for the following symptoms or if you have any questions:  Fever greater than 100 degrees Increased swelling, pain, or redness at injection site. Increased back and/or leg pain New numbness or change in symptoms from before the procedure.    Thank you for visiting [facility]. 

## 2022-10-22 NOTE — Progress Notes (Signed)
Pt back in nursing recovery area. Pt still drowsy from procedure but will wake and verbal when spoken to. Pt follows commands, talks in complete sentences and has no s/s of distress at this time. VS and orders assessed and pt denies additional complaints. Pt will remain in nursing station until discharge. Will continue to monitor and tx pt according to MD orders. Pt is eating crackers and drinking at this time and tolerating it well. Wife is at the bedside.

## 2022-10-30 ENCOUNTER — Telehealth: Payer: Self-pay

## 2022-10-30 NOTE — Telephone Encounter (Signed)
Phone call to pt to follow up from her kyphoplasty on 10/22/22 . Pt reports his pain has not improved at all since the procedure. Pt reports no improvement in mobility. Pt denies any signs of infection, redness at the site, draining or fever. Pt has no further complaints at this time and will be scheduled for a telephone follow up with Dr. Archer Asa next week. Pt advised to call back if anything were to change or any concerns arise and we will arrange an in person appointment. Pt verbalized understanding.

## 2022-10-30 NOTE — Telephone Encounter (Signed)
Phone call to pt to follow up from her kyphoplasty on 02/26/21. Pt reports her pain is completely gone post procedure but is still having "a little soreness". Pt reports she is able to move around a little better. Pt denies any signs of infection, redness at the site, draining or fever. Pt has no complaints at this time and will be scheduled for a telephone follow up with Dr. Laurence Ferrari next week. Pt advised to call back if anything were to change or any concerns arise and we will arrange an in person appointment. Pt verbalized understanding.

## 2022-11-02 ENCOUNTER — Other Ambulatory Visit: Payer: Self-pay | Admitting: Interventional Radiology

## 2022-11-02 DIAGNOSIS — S32000A Wedge compression fracture of unspecified lumbar vertebra, initial encounter for closed fracture: Secondary | ICD-10-CM

## 2022-11-03 ENCOUNTER — Other Ambulatory Visit: Payer: Self-pay | Admitting: Interventional Radiology

## 2022-11-03 ENCOUNTER — Ambulatory Visit
Admission: RE | Admit: 2022-11-03 | Discharge: 2022-11-03 | Disposition: A | Discharge status: Home / Self Care | Payer: Medicare Other | Source: Ambulatory Visit | Attending: Interventional Radiology | Admitting: Interventional Radiology

## 2022-11-03 DIAGNOSIS — S32010A Wedge compression fracture of first lumbar vertebra, initial encounter for closed fracture: Secondary | ICD-10-CM

## 2022-11-03 DIAGNOSIS — S32000A Wedge compression fracture of unspecified lumbar vertebra, initial encounter for closed fracture: Secondary | ICD-10-CM

## 2022-11-03 DIAGNOSIS — M4856XA Collapsed vertebra, not elsewhere classified, lumbar region, initial encounter for fracture: Secondary | ICD-10-CM

## 2022-11-03 HISTORY — PX: IR RADIOLOGIST EVAL & MGMT: IMG5224

## 2022-11-03 NOTE — Progress Notes (Signed)
Chief Complaint: Patient was consulted remotely today (TeleHealth) for low back pain at the request of , K.    Referring Physician(s): Drake Leach, PA-C   History of Present Illness: Mark Esty. is a 82 y.o. male Who was suffering with an osteoporotic compression fracture of L2 and underwent percutaneous cement augmentation with balloon kyphoplasty on 09/21/2022.  We spoke today over the telephone for his follow-up evaluation.  Unfortunately, his pain remains unabated and unchanged.  Additionally, his mobility remains decreased and he continues to require use of a wheelchair.  This is very unfortunate as his procedure went well and appeared to be successful from a technical standpoint.  He has had no worsening of his symptoms but denies any improvement during his recovery process.  Based on the this makes me suspicious that he had developed a second fracture between the timing of his initial MRI and the treatment.  The second fracture may remain untreated and be contributing to his symptoms.  I discussed this with him and he is interested in pursuing a follow-up MRI to see if there is anything else we can offer to help him with his pain.  Past Medical History:  Diagnosis Date   Anxiety    Atrial fibrillation (HCC)    Back pain, chronic    Cancer (HCC)    skin   Colon polyp    Cyst of left kidney    Dementia (HCC)    GERD (gastroesophageal reflux disease)    Heart murmur    Hyperlipemia    Mitral valve prolapse    Neuromuscular disorder (HCC)    Neuropathy    Osteoarthritis    Pulmonary emboli (HCC)    Spinal stenosis    Thyroid nodule    Vertigo     Past Surgical History:  Procedure Laterality Date   APPENDECTOMY     BACK SURGERY     COLONOSCOPY N/A 08/15/2014   Procedure: COLONOSCOPY;  Surgeon: Scot Jun, MD;  Location: [facility] ENDOSCOPY;  Service: Endoscopy;  Laterality: N/A;   ESOPHAGOGASTRODUODENOSCOPY N/A 08/15/2014   Procedure:  ESOPHAGOGASTRODUODENOSCOPY (EGD);  Surgeon: Scot Jun, MD;  Location: [facility] ENDOSCOPY;  Service: Endoscopy;  Laterality: N/A;   IR KYPHO LUMBAR INC FX REDUCE BONE BX UNI/BIL CANNULATION INC/IMAGING  10/22/2022   IR RADIOLOGIST EVAL & MGMT  10/15/2022   IR RADIOLOGIST EVAL & MGMT  11/03/2022   TONSILLECTOMY      Allergies: Aricept [donepezil], Ciprofloxacin, Erythromycin, Keflex [cephalexin], and Penicillins  Medications: Prior to Admission medications   Medication Sig Start Date End Date Taking? Authorizing Provider  apixaban (ELIQUIS) 2.5 MG TABS tablet Take 1 tablet by mouth every 12 (twelve) hours. 06/25/22   [provider]  buPROPion (WELLBUTRIN) 75 MG tablet Take 150 mg by mouth daily with breakfast.    [provider]  cholecalciferol (VITAMIN D3) 10 MCG (400 UNIT) TABS tablet Take 400 Units by mouth daily.    [provider]  Coenzyme Q10 (CO Q 10 PO) Take 1 Dose by mouth daily.     [provider]  Cyanocobalamin (B-12) 5000 MCG CAPS Take 5,000 mg by mouth daily. 02/22/18   Ihor Austin, MD  docusate sodium (COLACE) 100 MG capsule Take 300 mg by mouth at bedtime.    [provider]  esomeprazole (NEXIUM) 40 MG capsule Take 1 capsule by mouth daily. 07/09/22   [provider]  finasteride (PROSCAR) 5 MG tablet Take 1 tablet (5 mg total) by mouth  daily. 12/24/21   Vanna Scotland, MD  FLUoxetine HCl 60 MG TABS Take 1 tablet by mouth at bedtime. For depression    [provider]  folic acid (FOLVITE) 1 MG tablet Take 1 tablet (1 mg total) by mouth daily. 08/07/22 02/03/23  Gillis Santa, MD  gabapentin (NEURONTIN) 300 MG capsule Take 300 mg daily for a week then increase to 300 mg twice a day 10/01/22   [provider]  HYDROcodone-acetaminophen (NORCO/VICODIN) 5-325 MG tablet Take 1 tablet by mouth. 09/25/22   [provider]  memantine (NAMENDA) 5 MG tablet 5 mg 2 (two) times daily. 07/09/22   [provider]  niacinamide 100 MG tablet Take 100 mg by mouth in the morning.    [provider]  nitrofurantoin, macrocrystal-monohydrate, (MACROBID) 100 MG capsule Take 100 mg by mouth 2 (two) times daily.    [provider]  ondansetron (ZOFRAN-ODT) 4 MG disintegrating tablet Take 1 tablet (4 mg total) by mouth every 8 (eight) hours as needed for nausea or vomiting. 08/01/22   Irean Hong, MD  OVER THE COUNTER MEDICATION Take 1 tablet by mouth daily. ZINC Patient not taking: Reported on 10/15/2022    [provider]  simvastatin (ZOCOR) 40 MG tablet Take 40 mg by mouth daily.    [provider]  tamsulosin (FLOMAX) 0.4 MG CAPS capsule Take 1 capsule (0.4 mg total) by mouth daily. 12/24/21   Vanna Scotland, MD  triamcinolone (KENALOG) 0.1 % paste 1 Application 2 (two) times daily. 02/04/22   [provider]     Family History  Problem Relation Age of Onset   Stroke Mother    COPD Father     Social History   Socioeconomic History   Marital status: Married    Spouse name: gail   Number of children: 2   Years of education: Not on file   Highest education level: Master's degree (e.g., MA, MS, MEng, MEd, MSW, MBA)  Occupational History   Not on file  Tobacco Use   Smoking status: Former    Current packs/day: 0.00    Types: Cigarettes    Quit date: 04/20/1958    Years since quitting: 64.5   Smokeless tobacco: Never  Vaping Use   Vaping status: Never Used  Substance and Sexual Activity   Alcohol use: Yes    Alcohol/week: 1.0 standard drink of alcohol    Types: 1 Glasses of wine per week   Drug use: Never   Sexual activity: Not Currently  Other Topics Concern   Not on file  Social History Narrative   Not on file   Social Determinants of Health   Financial Resource Strain: Low Risk  (06/02/2022)   Received from [facility], [facility]   Overall Financial Resource Strain (CARDIA)    Difficulty  of Paying Living Expenses: Not hard at all  Food Insecurity: No Food Insecurity (08/03/2022)   Hunger Vital Sign    Worried About Running Out of Food in the Last Year: Never true    Ran Out of Food in the Last Year: Never true  Transportation Needs: No Transportation Needs (08/03/2022)   PRAPARE - Administrator, Civil Service (Medical): No    Lack of Transportation (Non-Medical): No  Physical Activity: Insufficiently Active (04/20/2018)   Exercise Vital Sign    Days of Exercise per Week: 2 days    Minutes of Exercise per Session: 60 min  Stress: No Stress  Concern Present (04/20/2018)   [employer] of Occupational Health - Occupational Stress Questionnaire    Feeling of Stress : Not at all  Social Connections: Unknown (04/20/2018)   Social Connection and Isolation Panel [NHANES]    Frequency of Communication with Friends and Family: Not on file    Frequency of Social Gatherings with Friends and Family: Not on file    Attends Religious Services: More than 4 times per year    Active Member of [employer] or Organizations: Yes    Attends Engineer, structural: More than 4 times per year    Marital Status: Married    Review of Systems  Review of Systems: A 12 point ROS discussed and pertinent positives are indicated in the HPI above.  All other systems are negative.  Advance Care Plan: The advanced care plan/surrogate decision maker was discussed at the time of visit and the patient did not wish to discuss or was not able to name a surrogate decision maker or provide an advance care plan.    Physical Exam No direct physical exam was performed (except for noted visual exam findings with Video Visits).    Vital Signs: There were no vitals taken for this visit.  Imaging: IR Radiologist Eval & Mgmt  Result Date: 11/03/2022 EXAM: ESTABLISHED PATIENT OFFICE VISIT CHIEF COMPLAINT: SEE EPIC NOTE HISTORY OF PRESENT ILLNESS: SEE EPIC NOTE REVIEW OF SYSTEMS: SEE EPIC NOTE  PHYSICAL EXAMINATION: SEE EPIC NOTE ASSESSMENT AND PLAN: SEE EPIC NOTE Electronically Signed   By: Malachy Moan M.D.   On: 11/03/2022 09:07   IR KYPHO LUMBAR INC FX REDUCE BONE BX UNI/BIL CANNULATION INC/IMAGING  Result Date: 10/22/2022 CLINICAL DATA:  81 year old male with subacute osteoporotic compression fracture of the L2 vertebral body. He presents for cement augmentation with balloon kyphoplasty. EXAM: FLUOROSCOPIC GUIDED KYPHOPLASTY OF THE L2 VERTEBRAL BODY COMPARISON:  None Available. MEDICATIONS: As antibiotic prophylaxis, 1 g vancomycin was ordered pre-procedure and administered intravenously within 1 hour of incision. ANESTHESIA/SEDATION: Moderate (conscious) sedation was employed during this procedure. A total of Versed 3 mg and Fentanyl 100 mcg was administered intravenously. Moderate Sedation Time: 20 minutes. The patient's level of consciousness and vital signs were monitored continuously by radiology nursing throughout the procedure under my direct supervision. FLUOROSCOPY TIME:  Radiation exposure index: 56.6 mGy reference air kerma COMPLICATIONS: None immediate. PROCEDURE: The procedure, risks (including but not limited to bleeding, infection, organ damage), benefits, and alternatives were explained to the patient. Questions regarding the procedure were encouraged and answered. The patient understands and consents to the procedure. The patient was placed prone on the fluoroscopic table. The skin overlying the upper thoracic region was then prepped and draped in the usual sterile fashion. Maximal barrier sterile technique was utilized including caps, mask, sterile gowns, sterile gloves, sterile drape, hand hygiene and skin antiseptic. Intravenous Fentanyl and Versed were administered as conscious sedation during continuous cardiorespiratory monitoring by the radiology RN. The left pedicle at L2 was then infiltrated with 1% lidocaine followed by the advancement of a Kyphon trocar needle  through the left pedicle into the posterior one-third of the vertebral body. Subsequently, the osteo drill was advanced to the anterior third of the vertebral body. The osteo drill was retracted. Through the working cannula, a Kyphon inflatable bone tamp 15 x 2.5 was advanced and positioned with the distal marker approximately 5 mm from the anterior aspect of the cortex. Appropriate positioning was confirmed on the AP projection. At this time, the balloon was expanded  using contrast via a Kyphon inflation syringe device via micro tubing. The balloon is central within the vertebral body. Inflations were continued until there was near apposition with the superior end plate. At this time, methylmethacrylate mixture was reconstituted in the Kyphon bone mixing device system. This was then loaded into the delivery mechanism, attached to the cement delivery system. The balloons were deflated and removed followed by the instillation of methylmethacrylate mixture with excellent filling in the AP and lateral projections. No extravasation was noted in the disk spaces or posteriorly into the spinal canal. No epidural venous contamination was seen. The working cannulae and the bone filler were then retrieved and removed. Hemostasis was achieved with manual compression. The patient tolerated the procedure well without immediate postprocedural complication. IMPRESSION: 1. Technically successful L2 vertebral body augmentation using balloon kyphoplasty. 2. Per CMS PQRS reporting requirements (PQRS Measure 24): Given the patient's age of greater than 50 and the fracture site (hip, distal radius, or spine), the patient should be tested for osteoporosis using DXA, and the appropriate treatment considered based on the DXA results. Electronically Signed   By: Malachy Moan M.D.   On: 10/22/2022 14:56   IR Radiologist Eval & Mgmt  Result Date: 10/15/2022 EXAM: NEW PATIENT OFFICE VISIT CHIEF COMPLAINT: Documented in the EMR HISTORY  OF PRESENT ILLNESS: The patient reports onset of acute lower back pain at the beginning of May 2024 after experiencing a fall. Patient was ambulatory with walker at baseline. Subsequent imaging including lumbar spine radiograph and CT of the lumbar spine on May 12 and Aug 03, 2022, respectively, demonstrated suspected L2 compression fracture. This was confirmed on L-spine MRI on September 17, 2022 confirmed the presence of acute to subacute compression deformity of the L2 vertebral body. The patient was then seen in the neurosurgery clinic most recently on July 23rd. The patient has been under conservative therapy measures including bracing and prescription pain medication (hydrocodone/Tylenol), and has additionally been involved with home health physical therapy. Unfortunately he continues to have severe lower back pain, which she rates at 8/10 at baseline. There is minimal improvement in his back pain with the prescription pain medicine to 6/10. He rates significant disability on the [employer] disability questionnaire with 17/24 positive. Most significantly, he is gone from being able to ambulate with a walker to now being bedridden/needing a wheelchair for mobility. This has caused significant decrease in his quality of life, as he is unable to stand or walk. REVIEW OF SYSTEMS: Documented in the EMR PHYSICAL EXAMINATION: Documented in the EMR ASSESSMENT AND PLAN: Documented in the EMR Electronically Signed   By: Olive Bass M.D.   On: 10/15/2022 14:11    Labs:  CBC: Recent Labs    08/02/22 1808 08/03/22 0422 08/04/22 0514 10/15/22 1200  WBC 10.7* 9.0 8.8 10.2  HGB 15.0 13.3 13.2 14.9  HCT 44.7 40.5 40.2 45.0  PLT 225 200 188 240    COAGS: Recent Labs    10/15/22 1200  INR 1.1    BMP: Recent Labs    01/16/22 1416 08/02/22 1808 08/03/22 0422 08/04/22 0514 10/15/22 1200  NA 142 137 138 135 142  K 3.6 4.2 3.9 3.8 3.8  CL 107 102 105 102 107  CO2 27 22 25 24 26   GLUCOSE 111* 114*  87 96 131*  BUN 14 19 19 18 14   CALCIUM 9.0 9.2 8.9 8.4* 9.3  CREATININE 0.91 1.12 1.02 0.90 0.87  GFRNONAA >60 >60 >60 >60  --  LIVER FUNCTION TESTS: Recent Labs    08/02/22 1808 08/03/22 0422 10/15/22 1200  BILITOT 0.9 1.1 0.4  AST 25 21 11   ALT 16 14 12   ALKPHOS 66 58  --   PROT 7.6 6.6 6.4  ALBUMIN 4.1 3.6  --     TUMOR MARKERS: No results for input(s): "AFPTM", "CEA", "CA199", "CHROMGRNA" in the last 8760 hours.  Assessment and Plan:     Very pleasant 82 year old gentleman with persistent and unchanged back pain and decreased motility despite undergoing L2 kyphoplasty on 09/21/2022.  I am suspicious that he has an additional untreated fracture.  1.) Repeat MRI lumbar spine with clinic visit to follow   Electronically Signed: Sterling Big 11/03/2022, 9:10 AM   I spent a total of 10 Minutes in remote  clinical consultation, greater than 50% of which was counseling/coordinating care for L2 compression fracture post kyphoplasty.    Visit type: Audio only (telephone). Audio (no video) only due to patient preference. Alternative for in-person consultation at [facility], [address] [place], [place], [place]. This visit type was conducted due to national recommendations for restrictions regarding the COVID-19 Pandemic (e.g. social distancing).  This format is felt to be most appropriate for this patient at this time.  All issues noted in this document were discussed and addressed.

## 2022-11-04 ENCOUNTER — Ambulatory Visit
Admission: RE | Admit: 2022-11-04 | Discharge: 2022-11-04 | Disposition: A | Discharge status: Home / Self Care | Payer: Medicare Other | Source: Ambulatory Visit | Attending: Interventional Radiology | Admitting: Interventional Radiology

## 2022-11-04 DIAGNOSIS — M4856XA Collapsed vertebra, not elsewhere classified, lumbar region, initial encounter for fracture: Secondary | ICD-10-CM | POA: Diagnosis not present

## 2022-11-05 ENCOUNTER — Ambulatory Visit
Admission: RE | Admit: 2022-11-05 | Discharge: 2022-11-05 | Disposition: A | Discharge status: Home / Self Care | Payer: Medicare Other | Source: Ambulatory Visit | Attending: Interventional Radiology | Admitting: Interventional Radiology

## 2022-11-05 ENCOUNTER — Telehealth: Payer: Self-pay | Admitting: Orthopedic Surgery

## 2022-11-05 ENCOUNTER — Other Ambulatory Visit: Payer: Self-pay | Admitting: Family Medicine

## 2022-11-05 DIAGNOSIS — S32010A Wedge compression fracture of first lumbar vertebra, initial encounter for closed fracture: Secondary | ICD-10-CM

## 2022-11-05 DIAGNOSIS — M5416 Radiculopathy, lumbar region: Secondary | ICD-10-CM

## 2022-11-05 HISTORY — PX: IR RADIOLOGIST EVAL & MGMT: IMG5224

## 2022-11-05 NOTE — Progress Notes (Signed)
Chief Complaint: Patient was consulted remotely today (TeleHealth) for low back pain, L2 compression fracture at the request of , K.    Referring Physician(s): Drake Leach, PA-C   History of Present Illness: Mark [name]. is a 82 y.o. male who was suffering with an osteoporotic compression fracture of L2 and underwent percutaneous cement augmentation with balloon kyphoplasty on 09/21/2022.  We spoke today over the telephone for his follow-up evaluation.  Unfortunately, his pain remains unabated and unchanged.  Additionally, his mobility remains decreased and he continues to require use of a wheelchair.  This is very unfortunate as his procedure went well and appeared to be successful from a technical standpoint.   He had an MRI of the lumbar spine on 11/04/2022 to assess for a source for his persistent pain.  MRI 11/04/22: 1. No acute findings or explanation for the patient's symptoms. 2. Expected findings related to recent spinal augmentation at L2. No progressive loss of height or osseous retropulsion. No new fractures. 3. 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  I spoke extensively with he and his wife over the phone today.  He continues to have unrelenting back pain which is not improving.  He is interested in any potential intervention that may offer him some relief.  He was reassured that the kyphoplasty appears to have effectively stabilized the L2 fracture and that there are no new fractures.  We discussed that his lumbar spondylosis may be contributing to his back pain, particularly at the L3-L4 level where there is significant central and foraminal stenosis.  Past Medical History:  Diagnosis Date   Anxiety    Atrial fibrillation (HCC)    Back pain, chronic    Cancer (HCC)    skin   Colon polyp    Cyst of left kidney    Dementia (HCC)    GERD (gastroesophageal reflux  disease)    Heart murmur    Hyperlipemia    Mitral valve prolapse    Neuromuscular disorder (HCC)    Neuropathy    Osteoarthritis    Pulmonary emboli (HCC)    Spinal stenosis    Thyroid nodule    Vertigo     Past Surgical History:  Procedure Laterality Date   APPENDECTOMY     BACK SURGERY     COLONOSCOPY N/A 08/15/2014   Procedure: COLONOSCOPY;  Surgeon: Scot Jun, MD;  Location: [facility] ENDOSCOPY;  Service: Endoscopy;  Laterality: N/A;   ESOPHAGOGASTRODUODENOSCOPY N/A 08/15/2014   Procedure: ESOPHAGOGASTRODUODENOSCOPY (EGD);  Surgeon: Scot Jun, MD;  Location: [facility] ENDOSCOPY;  Service: Endoscopy;  Laterality: N/A;   IR KYPHO LUMBAR INC FX REDUCE BONE BX UNI/BIL CANNULATION INC/IMAGING  10/22/2022   IR RADIOLOGIST EVAL & MGMT  10/15/2022   IR RADIOLOGIST EVAL & MGMT  11/03/2022   IR RADIOLOGIST EVAL & MGMT  11/05/2022   TONSILLECTOMY      Allergies: Aricept [donepezil], Ciprofloxacin, Erythromycin, Keflex [cephalexin], and Penicillins  Medications: Prior to Admission medications   Medication Sig Start Date End Date Taking? Authorizing Provider  apixaban (ELIQUIS) 2.5 MG TABS tablet Take 1 tablet by mouth every 12 (twelve) hours. 06/25/22   [provider]  buPROPion (WELLBUTRIN) 75 MG tablet Take 150 mg by mouth daily with breakfast.    [provider]  cholecalciferol (VITAMIN D3) 10 MCG (400 UNIT) TABS tablet Take 400 Units by mouth daily.    [provider]  Coenzyme Q10 (  CO Q 10 PO) Take 1 Dose by mouth daily.     [provider]  Cyanocobalamin (B-12) 5000 MCG CAPS Take 5,000 mg by mouth daily. 02/22/18   Ihor Austin, MD  docusate sodium (COLACE) 100 MG capsule Take 300 mg by mouth at bedtime.    [provider]  esomeprazole (NEXIUM) 40 MG capsule Take 1 capsule by mouth daily. 07/09/22   [provider]  finasteride (PROSCAR) 5 MG tablet Take 1 tablet (5 mg total) by mouth daily. 12/24/21   Vanna Scotland, MD   FLUoxetine HCl 60 MG TABS Take 1 tablet by mouth at bedtime. For depression    [provider]  folic acid (FOLVITE) 1 MG tablet Take 1 tablet (1 mg total) by mouth daily. 08/07/22 02/03/23  Gillis Santa, MD  gabapentin (NEURONTIN) 300 MG capsule Take 300 mg daily for a week then increase to 300 mg twice a day 10/01/22   [provider]  HYDROcodone-acetaminophen (NORCO/VICODIN) 5-325 MG tablet Take 1 tablet by mouth. 09/25/22   [provider]  memantine (NAMENDA) 5 MG tablet 5 mg 2 (two) times daily. 07/09/22   [provider]  niacinamide 100 MG tablet Take 100 mg by mouth in the morning.    [provider]  nitrofurantoin, macrocrystal-monohydrate, (MACROBID) 100 MG capsule Take 100 mg by mouth 2 (two) times daily.    [provider]  ondansetron (ZOFRAN-ODT) 4 MG disintegrating tablet Take 1 tablet (4 mg total) by mouth every 8 (eight) hours as needed for nausea or vomiting. 08/01/22   Irean Hong, MD  OVER THE COUNTER MEDICATION Take 1 tablet by mouth daily. ZINC Patient not taking: Reported on 10/15/2022    [provider]  simvastatin (ZOCOR) 40 MG tablet Take 40 mg by mouth daily.    [provider]  tamsulosin (FLOMAX) 0.4 MG CAPS capsule Take 1 capsule (0.4 mg total) by mouth daily. 12/24/21   Vanna Scotland, MD  triamcinolone (KENALOG) 0.1 % paste 1 Application 2 (two) times daily. 02/04/22   [provider]     Family History  Problem Relation Age of Onset   Stroke Mother    COPD Father     Social History   Socioeconomic History   Marital status: Married    Spouse name: gail   Number of children: 2   Years of education: Not on file   Highest education level: Master's degree (e.g., MA, MS, MEng, MEd, MSW, MBA)  Occupational History   Not on file  Tobacco Use   Smoking status: Former    Current packs/day: 0.00    Types: Cigarettes    Quit date: 04/20/1958    Years since quitting: 64.5    Smokeless tobacco: Never  Vaping Use   Vaping status: Never Used  Substance and Sexual Activity   Alcohol use: Yes    Alcohol/week: 1.0 standard drink of alcohol    Types: 1 Glasses of wine per week   Drug use: Never   Sexual activity: Not Currently  Other Topics Concern   Not on file  Social History Narrative   Not on file   Social Determinants of Health   Financial Resource Strain: Low Risk  (06/02/2022)   [facility], [facility]   Overall Financial Resource Strain (CARDIA)    Difficulty of Paying Living Expenses: Not hard at all  Food Insecurity: No Food Insecurity (08/03/2022)   Hunger Vital Sign    Worried  About Running Out of Food in the Last Year: Never true    Ran Out of Food in the Last Year: Never true  Transportation Needs: No Transportation Needs (08/03/2022)   PRAPARE - Administrator, Civil Service (Medical): No    Lack of Transportation (Non-Medical): No  Physical Activity: Insufficiently Active (04/20/2018)   Exercise Vital Sign    Days of Exercise per Week: 2 days    Minutes of Exercise per Session: 60 min  Stress: No Stress Concern Present (04/20/2018)   [employer] of Occupational Health - Occupational Stress Questionnaire    Feeling of Stress : Not at all  Social Connections: Unknown (04/20/2018)   Social Connection and Isolation Panel [NHANES]    Frequency of Communication with Friends and Family: Not on file    Frequency of Social Gatherings with Friends and Family: Not on file    Attends Religious Services: More than 4 times per year    Active Member of [employer] or Organizations: Yes    Attends Engineer, structural: More than 4 times per year    Marital Status: Married    Review of Systems  Review of Systems: A 12 point ROS discussed and pertinent positives are indicated in the HPI above.  All other systems are negative.  Advance Care Plan: The advanced care plan/surrogate  decision maker was discussed at the time of visit and the patient did not wish to discuss or was not able to name a surrogate decision maker or provide an advance care plan.    Physical Exam No direct physical exam was performed (except for noted visual exam findings with Video Visits).    Vital Signs: There were no vitals taken for this visit.  Imaging: IR Radiologist Eval & Mgmt  Result Date: 11/05/2022 EXAM: ESTABLISHED PATIENT OFFICE VISIT CHIEF COMPLAINT: SEE EPIC NOTE HISTORY OF PRESENT ILLNESS: SEE EPIC NOTE REVIEW OF SYSTEMS: SEE EPIC NOTE PHYSICAL EXAMINATION: SEE EPIC NOTE ASSESSMENT AND PLAN: SEE EPIC NOTE Electronically Signed   By: Malachy Moan M.D.   On: 11/05/2022 09:14   MR LUMBAR SPINE WO CONTRAST  Result Date: 11/04/2022 CLINICAL DATA:  L2 kyphoplasty 10/22/2022. Persistent back pain. No recent injury. EXAM: MRI LUMBAR SPINE WITHOUT CONTRAST TECHNIQUE: Multiplanar, multisequence MR imaging of the lumbar spine was performed. No intravenous contrast was administered. COMPARISON:  Images from kyphoplasty procedure 10/22/2022, radiographs 10/08/2022, outside lumbar MRI 09/17/2022 and CT lumbar spine 08/03/2022. FINDINGS: Segmentation: Conventional anatomy assumed, with the last open disc space designated L5-S1.S1 has transitional elements. Concordant with previous numbering. Alignment: Stable minimal degenerative anterolisthesis at L4-5 and L5-S1. Vertebrae: Spinal augmentation changes at L2 without cement displacement or other complication. Healing fracture of the L2 vertebral body demonstrates no progressive loss of height (approximately 35%). No osseous retropulsion. Mild reactive edema posteriorly in the inferior endplate of L1 is unchanged. No new fractures are identified. No evidence of discitis or osteomyelitis. Conus medullaris: Extends to the L1 level and appears normal. Paraspinal and other soft tissues: No significant paraspinal findings. Partially imaged left renal  cysts for which no follow-up imaging is recommended. Disc levels: Sagittal images demonstrate no significant disc space findings within the visualized lower thoracic spine. L1-2: Moderate loss of disc height with annular disc bulging and endplate osteophytes. Mild to moderate facet and ligamentous hypertrophy. Resulting mild spinal stenosis with mild lateral recess and foraminal narrowing bilaterally, similar to previous studies. L2-3: Chronic loss of disc height with annular disc bulging and endplate osteophytes. Moderate  bilateral facet hypertrophy. Mild to moderate multifactorial spinal stenosis with mild lateral recess and foraminal narrowing bilaterally, similar to previous studies. L3-4: Relatively preserved disc height with mild disc bulging and moderate to severe facet and ligamentous hypertrophy. Resulting moderate multifactorial spinal stenosis with mild lateral recess narrowing bilaterally. The foramina are sufficiently patent. L4-5: Mild loss of disc height with annular disc bulging and endplate osteophytes. Severe bilateral facet hypertrophy. Moderate multifactorial spinal stenosis with mild lateral recess and left foraminal narrowing. L5-S1: Previous posterior decompression. Advanced bilateral facet hypertrophy. The spinal canal remains adequately decompressed. Mild lateral recess and foraminal narrowing bilaterally. IMPRESSION: 1. No acute findings or explanation for the patient's symptoms. 2. Expected findings related to recent spinal augmentation at L2. No progressive loss of height or osseous retropulsion. No new fractures. 3. 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 Electronically Signed   By: Carey Bullocks M.D.   On: 11/04/2022 16:53   IR Radiologist Eval & Mgmt  Result Date: 11/03/2022 EXAM: ESTABLISHED PATIENT OFFICE VISIT CHIEF COMPLAINT: SEE EPIC NOTE HISTORY OF PRESENT ILLNESS: SEE EPIC NOTE  REVIEW OF SYSTEMS: SEE EPIC NOTE PHYSICAL EXAMINATION: SEE EPIC NOTE ASSESSMENT AND PLAN: SEE EPIC NOTE Electronically Signed   By: Malachy Moan M.D.   On: 11/03/2022 09:07   IR KYPHO LUMBAR INC FX REDUCE BONE BX UNI/BIL CANNULATION INC/IMAGING  Result Date: 10/22/2022 CLINICAL DATA:  82 year old male with subacute osteoporotic compression fracture of the L2 vertebral body. He presents for cement augmentation with balloon kyphoplasty. EXAM: FLUOROSCOPIC GUIDED KYPHOPLASTY OF THE L2 VERTEBRAL BODY COMPARISON:  None Available. MEDICATIONS: As antibiotic prophylaxis, 1 g vancomycin was ordered pre-procedure and administered intravenously within 1 hour of incision. ANESTHESIA/SEDATION: Moderate (conscious) sedation was employed during this procedure. A total of Versed 3 mg and Fentanyl 100 mcg was administered intravenously. Moderate Sedation Time: 20 minutes. The patient's level of consciousness and vital signs were monitored continuously by radiology nursing throughout the procedure under my direct supervision. FLUOROSCOPY TIME:  Radiation exposure index: 56.6 mGy reference air kerma COMPLICATIONS: None immediate. PROCEDURE: The procedure, risks (including but not limited to bleeding, infection, organ damage), benefits, and alternatives were explained to the patient. Questions regarding the procedure were encouraged and answered. The patient understands and consents to the procedure. The patient was placed prone on the fluoroscopic table. The skin overlying the upper thoracic region was then prepped and draped in the usual sterile fashion. Maximal barrier sterile technique was utilized including caps, mask, sterile gowns, sterile gloves, sterile drape, hand hygiene and skin antiseptic. Intravenous Fentanyl and Versed were administered as conscious sedation during continuous cardiorespiratory monitoring by the radiology RN. The left pedicle at L2 was then infiltrated with 1% lidocaine followed by the  advancement of a Kyphon trocar needle through the left pedicle into the posterior one-third of the vertebral body. Subsequently, the osteo drill was advanced to the anterior third of the vertebral body. The osteo drill was retracted. Through the working cannula, a Kyphon inflatable bone tamp 15 x 2.5 was advanced and positioned with the distal marker approximately 5 mm from the anterior aspect of the cortex. Appropriate positioning was confirmed on the AP projection. At this time, the balloon was expanded using contrast via a Kyphon inflation syringe device via micro tubing. The balloon is central within the vertebral body. Inflations were continued until there was near apposition with the superior end plate. At this time, methylmethacrylate mixture was reconstituted in the [facility]  bone mixing device system. This was then loaded into the delivery mechanism, attached to the cement delivery system. The balloons were deflated and removed followed by the instillation of methylmethacrylate mixture with excellent filling in the AP and lateral projections. No extravasation was noted in the disk spaces or posteriorly into the spinal canal. No epidural venous contamination was seen. The working cannulae and the bone filler were then retrieved and removed. Hemostasis was achieved with manual compression. The patient tolerated the procedure well without immediate postprocedural complication. IMPRESSION: 1. Technically successful L2 vertebral body augmentation using balloon kyphoplasty. 2. Per CMS PQRS reporting requirements (PQRS Measure 24): Given the patient's age of greater than 50 and the fracture site (hip, distal radius, or spine), the patient should be tested for osteoporosis using DXA, and the appropriate treatment considered based on the DXA results. Electronically Signed   By: Malachy Moan M.D.   On: 10/22/2022 14:56   IR Radiologist Eval & Mgmt  Result Date: 10/15/2022 EXAM: NEW PATIENT OFFICE VISIT CHIEF  COMPLAINT: Documented in the EMR HISTORY OF PRESENT ILLNESS: The patient reports onset of acute lower back pain at the beginning of May 2024 after experiencing a fall. Patient was ambulatory with walker at baseline. Subsequent imaging including lumbar spine radiograph and CT of the lumbar spine on May 12 and Aug 03, 2022, respectively, demonstrated suspected L2 compression fracture. This was confirmed on L-spine MRI on September 17, 2022 confirmed the presence of acute to subacute compression deformity of the L2 vertebral body. The patient was then seen in the neurosurgery clinic most recently on July 23rd. The patient has been under conservative therapy measures including bracing and prescription pain medication (hydrocodone/Tylenol), and has additionally been involved with home health physical therapy. Unfortunately he continues to have severe lower back pain, which she rates at 8/10 at baseline. There is minimal improvement in his back pain with the prescription pain medicine to 6/10. He rates significant disability on the [employer] disability questionnaire with 17/24 positive. Most significantly, he is gone from being able to ambulate with a walker to now being bedridden/needing a wheelchair for mobility. This has caused significant decrease in his quality of life, as he is unable to stand or walk. REVIEW OF SYSTEMS: Documented in the EMR PHYSICAL EXAMINATION: Documented in the EMR ASSESSMENT AND PLAN: Documented in the EMR Electronically Signed   By: Olive Bass M.D.   On: 10/15/2022 14:11    Labs:  CBC: Recent Labs    08/02/22 1808 08/03/22 0422 08/04/22 0514 10/15/22 1200  WBC 10.7* 9.0 8.8 10.2  HGB 15.0 13.3 13.2 14.9  HCT 44.7 40.5 40.2 45.0  PLT 225 200 188 240    COAGS: Recent Labs    10/15/22 1200  INR 1.1    BMP: Recent Labs    01/16/22 1416 08/02/22 1808 08/03/22 0422 08/04/22 0514 10/15/22 1200  NA 142 137 138 135 142  K 3.6 4.2 3.9 3.8 3.8  CL 107 102 105 102 107   CO2 27 22 25 24 26   GLUCOSE 111* 114* 87 96 131*  BUN 14 19 19 18 14   CALCIUM 9.0 9.2 8.9 8.4* 9.3  CREATININE 0.91 1.12 1.02 0.90 0.87  GFRNONAA >60 >60 >60 >60  --     LIVER FUNCTION TESTS: Recent Labs    08/02/22 1808 08/03/22 0422 10/15/22 1200  BILITOT 0.9 1.1 0.4  AST 25 21 11   ALT 16 14 12   ALKPHOS 66 58  --   PROT 7.6  6.6 6.4  ALBUMIN 4.1 3.6  --     TUMOR MARKERS: No results for input(s): "AFPTM", "CEA", "CA199", "CHROMGRNA" in the last 8760 hours.  Assessment and Plan:  82 year old gentleman with persistent lower back pain despite successful L2 kyphoplasty.  Repeat MRI demonstrates a successfully treated L2 compression fracture without evidence of worsening edema or progressive height loss.  No new fracture is identified.  As before, there is significant spinal and foraminal stenosis most severe at L3-L4.  I suspect that this may be the source of the patient's ongoing back pain.  I believe he would benefit from a series of epidural steroid injections targeting L3-L4.  There is a fairly small amount of epidural fat at this level, it is possible that if the ESI cannot be performed successfully at L3-L4 that targeting L4-L5 may be an acceptable second choice.  1.) Schedule for series of 3 ESI at L3-L4.     Electronically Signed: Sterling Big 11/05/2022, 11:16 AM   I spent a total of  10 Minutes in remote  clinical consultation, greater than 50% of which was counseling/coordinating care for low back pain.    Visit type: Audio only (telephone). Audio (no video) only due to patient preference. Alternative for in-person consultation at [facility], [address] [place], [place], [place]. This visit type was conducted due to national recommendations for restrictions regarding the COVID-19 Pandemic (e.g. social distancing).  This format is felt to be most appropriate for this patient at this time.  All issues noted in this document were discussed and addressed.

## 2022-11-05 NOTE — Telephone Encounter (Signed)
FYI. I will place the orders and send them to you for cosign.

## 2022-11-05 NOTE — Telephone Encounter (Signed)
Note reviewed from today when he had televisit with Dr. Archer Asa.   He had persistent pain after L2 kyphoplasty on 09/21/22. Repeat MRI done and lumbar ESIs recommended.   Okay from my standpoint. Orders signed.

## 2022-11-05 NOTE — Telephone Encounter (Signed)
Victorino Dike from [facility] calling on behalf of Dr. Archer Asa. He is requesting patient have a series of 3 epidural injections. Can you send an order please.

## 2022-12-01 NOTE — Discharge Instructions (Signed)

## 2022-12-02 ENCOUNTER — Ambulatory Visit
Admission: RE | Admit: 2022-12-02 | Discharge: 2022-12-02 | Disposition: A | Discharge status: Home / Self Care | Payer: Medicare Other | Source: Ambulatory Visit | Attending: Orthopedic Surgery | Admitting: Orthopedic Surgery

## 2022-12-02 DIAGNOSIS — M5416 Radiculopathy, lumbar region: Secondary | ICD-10-CM

## 2022-12-02 MED ORDER — IOPAMIDOL (ISOVUE-M 200) INJECTION 41%
1.0000 mL | Freq: Once | INTRAMUSCULAR | Status: AC
  Administered 2022-12-02: 1 mL via EPIDURAL

## 2022-12-02 MED ORDER — METHYLPREDNISOLONE ACETATE 40 MG/ML INJ SUSP (RADIOLOG
80.0000 mg | Freq: Once | INTRAMUSCULAR | Status: AC
  Administered 2022-12-02: 80 mg via EPIDURAL

## 2022-12-23 NOTE — Progress Notes (Deleted)
12/24/2022 1:58 PM   Mark [name]. [name]-1942 [id number]  Referring provider: Barbette Reichmann, MD [address] [facility] [place],  [place] [address]  Urological history: 1. 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 BPH with LU TS  -aged out of prostate cancer screening  -Tamsulosin 0.4 mg daily and finasteride 5 mg daily   3.  Phimosis  -Mycolog cream    No chief complaint on file.   HPI: Mark [name]. is a 82 y.o. male who presents today for Two month follow up on recheck symptoms and UA after an UTI.  Previous records reviewed.   At his visit on 07/30/2022, Mark [name] and his wife presented to our office asking if he could be seen to rule out possible UTI for his symptoms of hallucination, being panicked and urinary frequency.   Mark [name] states he has noticed a little fogginess, but he denies any urinary symptoms.  Mark [name] states that this is only way that she knows he has urinary tract infection is when he starts having issues with concentration and not acting like himself.  UA yellow slightly cloudy, specific gravity 1.025, trace blood, pH 6.0, nitrate positive, leukocyte +1, 11-30 WBCs, 3-10 RBCs, 0-10 epithelial cells, hyaline cast present, mucus threads present and many bacteria.  Urine culture positive for staphylococcus epidermidis.  PVR 5 mL   He is having issues with back pain after suffering a fall.  Patient denies any modifying or aggravating factors.  Patient denies any recent UTI's, gross hematuria, dysuria or suprapubic/flank pain.  Patient denies any fevers, chills, nausea or vomiting.   UA amber clear, trace ketone, specific gravity greater than 1.030, trace heme, trace protein,   PMH: Past Medical History:  Diagnosis Date   Anxiety    Atrial fibrillation (HCC)    Back pain, chronic    Cancer (HCC)    skin   Colon polyp    Cyst of left kidney    Dementia (HCC)    GERD  (gastroesophageal reflux disease)    Heart murmur    Hyperlipemia    Mitral valve prolapse    Neuromuscular disorder (HCC)    Neuropathy    Osteoarthritis    Pulmonary emboli (HCC)    Spinal stenosis    Thyroid nodule    Vertigo     Surgical History: Past Surgical History:  Procedure Laterality Date   APPENDECTOMY     BACK SURGERY     COLONOSCOPY N/A 08/15/2014   Procedure: COLONOSCOPY;  Surgeon: Mark Jun, MD;  Location: [facility] ENDOSCOPY;  Service: Endoscopy;  Laterality: N/A;   ESOPHAGOGASTRODUODENOSCOPY N/A 08/15/2014   Procedure: ESOPHAGOGASTRODUODENOSCOPY (EGD);  Surgeon: Mark Jun, MD;  Location: [facility] ENDOSCOPY;  Service: Endoscopy;  Laterality: N/A;   IR KYPHO LUMBAR INC FX REDUCE BONE BX UNI/BIL CANNULATION INC/IMAGING  10/22/2022   IR RADIOLOGIST EVAL & MGMT  10/15/2022   IR RADIOLOGIST EVAL & MGMT  11/03/2022   IR RADIOLOGIST EVAL & MGMT  11/05/2022   TONSILLECTOMY      Home Medications:  Allergies as of 12/24/2022       Reactions   Aricept [donepezil]    Hallucination, confusion   Ciprofloxacin Other (See Comments)   Erythromycin Swelling   Keflex [cephalexin] Swelling   Penicillins Swelling   Has patient had a PCN reaction causing immediate rash, facial/tongue/throat swelling, SOB or lightheadedness with hypotension: Yes  Has patient had a PCN reaction causing severe rash involving mucus membranes or skin necrosis: No Has patient had a PCN reaction that required hospitalization: Unknown Has patient had a PCN reaction occurring within the last 10 years: No If all of the above answers are "NO", then may proceed with Cephalosporin use.        Medication List        Accurate as of December 23, 2022  1:58 PM. If you have any questions, ask your nurse or doctor.          B-12 5000 MCG Caps Take 5,000 mg by mouth daily.   buPROPion 75 MG tablet Commonly known as: WELLBUTRIN Take 150 mg by mouth daily with breakfast.   cholecalciferol 10 MCG (400  UNIT) Tabs tablet Commonly known as: VITAMIN D3 Take 400 Units by mouth daily.   CO Q 10 PO Take 1 Dose by mouth daily.   docusate sodium 100 MG capsule Commonly known as: COLACE Take 300 mg by mouth at bedtime.   Eliquis 2.5 MG Tabs tablet Generic drug: apixaban Take 1 tablet by mouth every 12 (twelve) hours.   esomeprazole 40 MG capsule Commonly known as: NEXIUM Take 1 capsule by mouth daily.   finasteride 5 MG tablet Commonly known as: PROSCAR Take 1 tablet (5 mg total) by mouth daily.   FLUoxetine HCl 60 MG Tabs Take 1 tablet by mouth at bedtime. For depression   folic acid 1 MG tablet Commonly known as: FOLVITE Take 1 tablet (1 mg total) by mouth daily.   gabapentin 300 MG capsule Commonly known as: NEURONTIN Take 300 mg daily for a week then increase to 300 mg twice a day   HYDROcodone-acetaminophen 5-325 MG tablet Commonly known as: NORCO/VICODIN Take 1 tablet by mouth.   memantine 5 MG tablet Commonly known as: NAMENDA 5 mg 2 (two) times daily.   niacinamide 100 MG tablet Take 100 mg by mouth in the morning.   nitrofurantoin (macrocrystal-monohydrate) 100 MG capsule Commonly known as: MACROBID Take 100 mg by mouth 2 (two) times daily.   ondansetron 4 MG disintegrating tablet Commonly known as: ZOFRAN-ODT Take 1 tablet (4 mg total) by mouth every 8 (eight) hours as needed for nausea or vomiting.   OVER THE COUNTER MEDICATION Take 1 tablet by mouth daily. ZINC   simvastatin 40 MG tablet Commonly known as: ZOCOR Take 40 mg by mouth daily.   tamsulosin 0.4 MG Caps capsule Commonly known as: FLOMAX Take 1 capsule (0.4 mg total) by mouth daily.   triamcinolone 0.1 % paste Commonly known as: KENALOG 1 Application 2 (two) times daily.        Allergies:  Allergies  Allergen Reactions   Aricept [Donepezil]     Hallucination, confusion   Ciprofloxacin Other (See Comments)   Erythromycin Swelling   Keflex [Cephalexin] Swelling   Penicillins  Swelling    Has patient had a PCN reaction causing immediate rash, facial/tongue/throat swelling, SOB or lightheadedness with hypotension: Yes Has patient had a PCN reaction causing severe rash involving mucus membranes or skin necrosis: No Has patient had a PCN reaction that required hospitalization: Unknown Has patient had a PCN reaction occurring within the last 10 years: No If all of the above answers are "NO", then may proceed with Cephalosporin use.     Family History: Family History  Problem Relation Age of Onset   Stroke Mother    COPD Father     Social History:  reports that he quit  smoking about 64 years ago. His smoking use included cigarettes. He has never used smokeless tobacco. He reports current alcohol use of about 1.0 standard drink of alcohol per week. He reports that he does not use drugs.  ROS: Pertinent ROS in HPI  Physical Exam: There were no vitals taken for this visit.  Constitutional:  Well nourished. Alert and oriented, No acute distress. HEENT: [place] AT, moist mucus membranes.  Trachea midline Cardiovascular: No clubbing, cyanosis, or edema. Respiratory: Normal respiratory effort, no increased work of breathing. Neurologic: Grossly intact, no focal deficits, moving all 4 extremities. Psychiatric: Normal mood and affect.   Laboratory Data: Component     Latest Ref Rng 08/04/2022  Sodium     135 - 145 mmol/L 135   Potassium     3.5 - 5.1 mmol/L 3.8   Chloride     98 - 111 mmol/L 102   CO2     22 - 32 mmol/L 24   Glucose     70 - 99 mg/dL 96   BUN     8 - 23 mg/dL 18   Creatinine     1.61 - 1.24 mg/dL 0.96   Calcium     8.9 - 10.3 mg/dL 8.4 (L)   Anion gap     5 - 15  9   GFR, Estimated     >60 mL/min >60     Legend: (L) Low  Urinalysis Results for orders placed or performed during the hospital encounter of 10/15/22  CBC  Result Value Ref Range   WBC 10.2 3.8 - 10.8 Thousand/uL   RBC 4.82 4.20 - 5.80 Million/uL   Hemoglobin 14.9 13.2  - 17.1 g/dL   HCT 04.5 40.9 - 81.1 %   MCV 93.4 80.0 - 100.0 fL   MCH 30.9 27.0 - 33.0 pg   MCHC 33.1 32.0 - 36.0 g/dL   RDW 91.4 78.2 - 95.6 %   Platelets 240 140 - 400 Thousand/uL   MPV 10.5 7.5 - 12.5 fL  COMPLETE METABOLIC PANEL WITH GFR  Result Value Ref Range   Glucose, Bld 131 (H) 65 - 99 mg/dL   BUN 14 7 - 25 mg/dL   Creat 2.13 0.86 - 5.78 mg/dL   eGFR 86 > OR = 60 IO/NGE/9.52W4   BUN/Creatinine Ratio SEE NOTE: 6 - 22 (calc)   Sodium 142 135 - 146 mmol/L   Potassium 3.8 3.5 - 5.3 mmol/L   Chloride 107 98 - 110 mmol/L   CO2 26 20 - 32 mmol/L   Calcium 9.3 8.6 - 10.3 mg/dL   Total Protein 6.4 6.1 - 8.1 g/dL   Albumin 3.8 3.6 - 5.1 g/dL   Globulin 2.6 1.9 - 3.7 g/dL (calc)   AG Ratio 1.5 1.0 - 2.5 (calc)   Total Bilirubin 0.4 0.2 - 1.2 mg/dL   Alkaline phosphatase (APISO) 86 35 - 144 U/L   AST 11 10 - 35 U/L   ALT 12 9 - 46 U/L  Protime-INR  Result Value Ref Range   INR 1.1    Prothrombin Time 11.3 9.0 - 11.5 sec    I have reviewed the labs.   Pertinent Imaging:   Assessment & Plan:    1. Suspected UTI -UA benign  2. Microscopic hematuria -resolved  No follow-ups on file.  These notes generated with voice recognition software. I apologize for typographical errors.  Cloretta Ned  [facility] [address]  [address] [place], [place]  [address] [phone]

## 2022-12-24 ENCOUNTER — Ambulatory Visit: Payer: Medicare Other | Admitting: Urology

## 2022-12-29 ENCOUNTER — Encounter: Payer: Self-pay | Admitting: Psychiatry

## 2022-12-29 ENCOUNTER — Telehealth (INDEPENDENT_AMBULATORY_CARE_PROVIDER_SITE_OTHER): Payer: Medicare Other | Admitting: Psychiatry

## 2022-12-29 DIAGNOSIS — G3184 Mild cognitive impairment, so stated: Secondary | ICD-10-CM | POA: Diagnosis not present

## 2022-12-29 DIAGNOSIS — G4701 Insomnia due to medical condition: Secondary | ICD-10-CM | POA: Diagnosis not present

## 2022-12-29 DIAGNOSIS — Z8659 Personal history of other mental and behavioral disorders: Secondary | ICD-10-CM

## 2022-12-29 DIAGNOSIS — F3342 Major depressive disorder, recurrent, in full remission: Secondary | ICD-10-CM

## 2022-12-29 NOTE — Progress Notes (Signed)
Virtual Visit via Video Note  I connected with Mark [name] on 12/29/22 at  4:00 PM EDT by a video enabled telemedicine application and verified that I am speaking with the correct person using two identifiers.  Location Provider Location : [facility] Patient Location : Home  Participants: Patient , Spouse,Provider    I discussed the limitations of evaluation and management by telemedicine and the availability of in person appointments. The patient expressed understanding and agreed to proceed.    I discussed the assessment and treatment plan with the patient. The patient was provided an opportunity to ask questions and all were answered. The patient agreed with the plan and demonstrated an understanding of the instructions.   The patient was advised to call back or seek an in-person evaluation if the symptoms worsen or if the condition fails to improve as anticipated.   BH MD OP Progress Note  12/29/2022 4:28 PM Mark [name]  MRN:  [id number]  Chief Complaint:  Chief Complaint  Patient presents with   Follow-up   Depression   Anxiety   Medication Refill   HPI: Mark Garrisonis a 82 year old Caucasian male, lives in [place], has a history of MDD, insomnia, history of pneumothorax, history of syncopal episode, BPH, RLS, obstructive sleep apnea noncompliant with CPAP, recent fall with spinal fracture, history of recurrent UTIs was evaluated by telemedicine today.  Patient as well as spouse participated in the evaluation.  Patient today appeared to be alert, oriented to the month, the day, the year.  Patient reports he continues to be wheelchair bound, unable to get up and walk since his fall.  According to spouse he has physical therapy sessions coming up.  Otherwise overall mood symptoms are currently stable.  Reports neurologist increase gabapentin to 600 mg at night and that has helped with pain and sleep at night.  Patient is currently compliant on Wellbutrin and Prozac.  He  denies side effects.  Patient denies any suicidality, homicidality or perceptual disturbances.  Denies any other concerns today.  Visit Diagnosis:    ICD-10-CM   1. MDD (major depressive disorder), recurrent, in full remission (HCC)  F33.42     2. Insomnia due to medical condition  G47.01    Depression, pain    3. MCI (mild cognitive impairment)  G31.84     4. Hx of psychosis  Z86.59       Past Psychiatric History: I have reviewed past psychiatric history from progress note on 04/20/2018.  Past trials of Prozac, Wellbutrin.  Past Medical History:  Past Medical History:  Diagnosis Date   Anxiety    Atrial fibrillation (HCC)    Back pain, chronic    Cancer (HCC)    skin   Colon polyp    Cyst of left kidney    Dementia (HCC)    GERD (gastroesophageal reflux disease)    Heart murmur    Hyperlipemia    Mitral valve prolapse    Neuromuscular disorder (HCC)    Neuropathy    Osteoarthritis    Pulmonary emboli (HCC)    Spinal stenosis    Thyroid nodule    Vertigo     Past Surgical History:  Procedure Laterality Date   APPENDECTOMY     BACK SURGERY     COLONOSCOPY N/A 08/15/2014   Procedure: COLONOSCOPY;  Surgeon: Scot Jun, MD;  Location: [facility] ENDOSCOPY;  Service: Endoscopy;  Laterality: N/A;   ESOPHAGOGASTRODUODENOSCOPY N/A 08/15/2014   Procedure: ESOPHAGOGASTRODUODENOSCOPY (EGD);  Surgeon: Scot Jun, MD;  Location: [facility] END[facility]COPY;  Service: Endoscopy;  Laterality: N/A;   IR KYPHO LUMBAR INC FX REDUCE BONE BX UNI/BIL CANNULATION INC/IMAGING  10/22/2022   IR RADIOLOGIST EVAL & MGMT  10/15/2022   IR RADIOLOGIST EVAL & MGMT  11/03/2022   IR RADIOLOGIST EVAL & MGMT  11/05/2022   TONSILLECTOMY      Family Psychiatric History: I have reviewed family psychiatric history from progress note on 04/20/2018.  Family History:  Family History  Problem Relation Age of Onset   Stroke Mother    COPD Father     Social History: I have reviewed social history from  progress note on 04/20/2018. Social History   Socioeconomic History   Marital status: Married    Spouse name: Mark [name]   Number of children: 2   Years of education: Not on file   Highest education level: Master's degree (e.g., MA, MS, MEng, MEd, MSW, MBA)  Occupational History   Not on file  Tobacco Use   Smoking status: Former    Current packs/day: 0.00    Types: Cigarettes    Quit date: 04/20/1958    Years since quitting: 64.7   Smokeless tobacco: Never  Vaping Use   Vaping status: Never Used  Substance and Sexual Activity   Alcohol use: Yes    Alcohol/week: 1.0 standard drink of alcohol    Types: 1 Glasses of wine per week   Drug use: Never   Sexual activity: Not Currently  Other Topics Concern   Not on file  Social History Narrative   Not on file   Social Determinants of Health   Financial Resource Strain: Low Risk  (12/16/2022)   Received from [facility]   Overall Financial Resource Strain (CARDIA)    Difficulty of Paying Living Expenses: Not hard at all  Food Insecurity: No Food Insecurity (12/16/2022)   Received from [facility]   Hunger Vital Sign    Worried About Running Out of Food in the Last Year: Never true    Ran Out of Food in the Last Year: Never true  Transportation Needs: No Transportation Needs (12/16/2022)   Received from [facility] - [facility]    In the past 12 months, has lack of transportation kept you from medical appointments or from getting medications?: No    Lack of Transportation (Non-Medical): No  Physical Activity: Insufficiently Active (04/20/2018)   Exercise Vital Sign    Days of Exercise per Week: 2 days    Minutes of Exercise per Session: 60 min  Stress: No Stress Concern Present (04/20/2018)   [employer] of Occupational Health - Occupational Stress Questionnaire    Feeling of Stress : Not at all  Social Connections: Unknown (04/20/2018)   Social Connection and  Isolation Panel [NHANES]    Frequency of Communication with Friends and Family: Not on file    Frequency of Social Gatherings with Friends and Family: Not on file    Attends Religious Services: More than 4 times per year    Active Member of [employer] or Organizations: Yes    Attends Banker Meetings: More than 4 times per year    Marital Status: Married    Allergies:  Allergies  Allergen Reactions   Aricept [Donepezil]     Hallucination, confusion   Ciprofloxacin Other (See Comments)   Erythromycin Swelling   Keflex [Cephalexin] Swelling   Penicillins Swelling    Has patient had  a PCN reaction causing immediate rash, facial/tongue/throat swelling, SOB or lightheadedness with hypotension: Yes Has patient had a PCN reaction causing severe rash involving mucus membranes or skin necrosis: No Has patient had a PCN reaction that required hospitalization: Unknown Has patient had a PCN reaction occurring within the last 10 years: No If all of the above answers are "NO", then may proceed with Cephalosporin use.     Metabolic Disorder Labs: No results found for: "HGBA1C", "MPG" No results found for: "PROLACTIN" No results found for: "CHOL", "TRIG", "HDL", "CHOLHDL", "VLDL", "LDLCALC" Lab Results  Component Value Date   TSH 0.866 02/21/2018    Therapeutic Level Labs: No results found for: "LITHIUM" No results found for: "VALPROATE" No results found for: "CBMZ"  Current Medications: Current Outpatient Medications  Medication Sig Dispense Refill   apixaban (ELIQUIS) 2.5 MG TABS tablet Take 1 tablet by mouth every 12 (twelve) hours.     buPROPion (WELLBUTRIN) 75 MG tablet Take 150 mg by mouth daily with breakfast.     cholecalciferol (VITAMIN D3) 10 MCG (400 UNIT) TABS tablet Take 400 Units by mouth daily.     Coenzyme Q10 (CO Q 10 PO) Take 1 Dose by mouth daily.      Cyanocobalamin (B-12) 5000 MCG CAPS Take 5,000 mg by mouth daily. 30 capsule 0   docusate sodium (COLACE)  100 MG capsule Take 300 mg by mouth at bedtime.     esomeprazole (NEXIUM) 40 MG capsule Take 1 capsule by mouth daily.     finasteride (PROSCAR) 5 MG tablet Take 1 tablet (5 mg total) by mouth daily. 90 tablet 3   FLUoxetine HCl 60 MG TABS Take 1 tablet by mouth at bedtime. For depression     folic acid (FOLVITE) 1 MG tablet Take 1 tablet (1 mg total) by mouth daily. 90 tablet 1   gabapentin (NEURONTIN) 300 MG capsule 300 mg daily and 600 mg at night     HYDROcodone-acetaminophen (NORCO/VICODIN) 5-325 MG tablet Take 1 tablet by mouth.     memantine (NAMENDA) 5 MG tablet 5 mg 2 (two) times daily.     niacinamide 100 MG tablet Take 100 mg by mouth in the morning.     nitrofurantoin, macrocrystal-monohydrate, (MACROBID) 100 MG capsule Take 100 mg by mouth 2 (two) times daily.     ondansetron (ZOFRAN-ODT) 4 MG disintegrating tablet Take 1 tablet (4 mg total) by mouth every 8 (eight) hours as needed for nausea or vomiting. 20 tablet 0   OVER THE COUNTER MEDICATION Take 1 tablet by mouth daily. ZINC (Patient not taking: Reported on 10/15/2022)     simvastatin (ZOCOR) 40 MG tablet Take 40 mg by mouth daily.     tamsulosin (FLOMAX) 0.4 MG CAPS capsule Take 1 capsule (0.4 mg total) by mouth daily. 90 capsule 3   triamcinolone (KENALOG) 0.1 % paste 1 Application 2 (two) times daily.     Current Facility-Administered Medications  Medication Dose Route Frequency Provider Last Rate Last Admin   nystatin (MYCOSTATIN/NYSTOP) topical powder   Topical BID Vanna Scotland, MD         Musculoskeletal: Strength & Muscle Tone:  UTA Gait & Station:  Seated Patient leans: N/A  Psychiatric Specialty Exam: Review of Systems  Psychiatric/Behavioral: Negative.      There were no vitals taken for this visit.There is no height or weight on file to calculate BMI.  General Appearance: Fairly Groomed  Eye Contact:  Fair  Speech:  Normal Rate  Volume:  Normal  Mood:  Euthymic  Affect:  Congruent  Thought  Process:  Goal Directed and Descriptions of Associations: Intact  Orientation:  Full (Time, Place, and Person)  Thought Content: Logical   Suicidal Thoughts:  No  Homicidal Thoughts:  No  Memory:  Immediate;   Fair Recent;   Fair Remote;   limited  Judgement:  Fair  Insight:  Fair  Psychomotor Activity:  Normal  Concentration:  Concentration: Fair and Attention Span: Fair  Recall:  [employer] of Knowledge: Fair  Language: Fair  Akathisia:  No  Handed:  Right  AIMS (if indicated): not done  Assets:  Communication Skills Desire for Improvement Housing Social Support  ADL's:  Intact  Cognition: WNL  Sleep:  Fair   Screenings: AIMS    Flowsheet Row Office Visit from 02/05/2022 in [facility]  AIMS Total Score 0      GAD-7    Flowsheet Row Office Visit from 07/30/2022 in [facility]  Total GAD-7 Score 1      Mini-Mental    Flowsheet Row Office Visit from 07/30/2022 in [facility]  Total Score (max 30 points ) 29      PHQ2-9    Flowsheet Row Office Visit from 07/30/2022 in [facility] Video Visit from 03/05/2022 in [facility] Office Visit from 07/21/2021 in [facility] Office Visit from 12/10/2020 in [facility]  PHQ-2 Total Score 0 0 0 0  PHQ-9 Total Score 1 -- 0 --      Flowsheet Row Video Visit from 12/29/2022 in [facility] Video Visit from 09/28/2022 in [facility] ED to Hosp-Admission (Discharged) from 08/02/2022 in [facility] 1C MEDICAL TELEMETRY  C-SSRS RISK CATEGORY No Risk No Risk No Risk        Assessment and Plan: JALIL LORUSSO Garrisonis a 82 year old Caucasian male, lives in  [place], has a history of depression, sleep problems, status post fall with L2 fracture, recurrent UTIs, was evaluated by telemedicine today.  Patient is currently stable with regards to his mood symptoms, plan as noted below.  Plan MDD in full remission Wellbutrin XL 150 mg p.o. daily. Prozac 60 mg p.o. daily  Insomnia-improving Continue gabapentin as prescribed by neurology, takes 300 mg during the day and 600 mg at bedtime which does help with sleep at night Patient does have access to Seroquel 12.5-25 mg at bedtime as needed although does not use it at this time.  [facility]-patient to continue follow-up with neurology MMSE-29 out of 30-07/30/2022.   Collateral information obtained from spouse as noted above.   Consent: Patient/Guardian gives verbal consent for treatment and assignment of benefits for services provided during this visit. Patient/Guardian expressed understanding and agreed to proceed.   Follow-up in clinic in 6 months or sooner in person.  This note was generated in part or whole with voice recognition software. Voice recognition is usually quite accurate but there are transcription errors that can and very often do occur. I apologize for any typographical errors that were not detected and corrected.    Jomarie Longs, MD 12/29/2022, 4:28 PM

## 2023-01-07 ENCOUNTER — Ambulatory Visit: Payer: Medicare Other | Admitting: Urology

## 2023-01-14 NOTE — Progress Notes (Signed)
01/19/2023 10:40 AM   Mark [name]. 11/22/1940 [id number]  Referring provider: Barbette Reichmann, MD [address] [facility] [place],  [place] [address]  Urological history: 1. 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 BPH with LU TS  -aged out of prostate cancer screening  -Tamsulosin 0.4 mg daily and finasteride 5 mg daily   3.  Phimosis  -Mycolog cream   Chief Complaint  Patient presents with   Follow-up    HPI: Mark [name]. is a 82 y.o. male who presents today for follow up with his wife, Mark [name].   Previous records reviewed.   At his visit on 07/30/2022, Mr. Sui and his wife presented to our office asking if he could be seen to rule out possible UTI for his symptoms of hallucination, being panicked and urinary frequency.   Mr. Burditt states he has noticed a little fogginess, but he denies any urinary symptoms.  Mrs. Tremaine states that this is only way that she knows he has urinary tract infection is when he starts having issues with concentration and not acting like himself.  UA yellow slightly cloudy, specific gravity 1.025, trace blood, pH 6.0, nitrate positive, leukocyte +1, 11-30 WBCs, 3-10 RBCs, 0-10 epithelial cells, hyaline cast present, mucus threads present and many bacteria.  Urine culture positive for staphylococcus epidermidis.  PVR 5 mL   At his visit on 10/06/2022, he is having issues with back pain after suffering a fall.  Patient denies any modifying or aggravating factors.  Patient denies any recent UTI's, gross hematuria, dysuria or suprapubic/flank pain.  Patient denies any fevers, chills, nausea or vomiting.   UA amber clear, trace ketone, specific gravity greater than 1.030, trace heme, trace protein,   I PSS 7/0  PVR 257 mL    No urinary complaints.  He is taking Macrobid 100 mg daily, tamsulosin 0.4 mg daily and finasteride 5 mg daily.  Patient denies any modifying or aggravating  factors.  Patient denies any recent UTI's, gross hematuria, dysuria or suprapubic/flank pain.  Patient denies any fevers, chills, nausea or vomiting.     IPSS     Row Name 01/19/23 1000         International Prostate Symptom Score   How often have you had the sensation of not emptying your bladder? Not at All     How often have you had to urinate less than every two hours? About half the time     How often have you found you stopped and started again several times when you urinated? Not at All     How often have you found it difficult to postpone urination? About half the time     How often have you had a weak urinary stream? Not at All     How often have you had to strain to start urination? Not at All     How many times did you typically get up at night to urinate? 1 Time     Total IPSS Score 7       Quality of Life due to urinary symptoms   If you were to spend the rest of your life with your urinary condition just the way it is now how would you feel about that? Pleased              Score:  1-7 Mild 8-19 Moderate 20-35 Severe  PMH: Past Medical History:  Diagnosis Date   Anxiety    Atrial fibrillation (HCC)    Back pain, chronic    Cancer (HCC)    skin   Colon polyp    Cyst of left kidney    Dementia (HCC)    GERD (gastroesophageal reflux disease)    Heart murmur    Hyperlipemia    Mitral valve prolapse    Neuromuscular disorder (HCC)    Neuropathy    Osteoarthritis    Pulmonary emboli (HCC)    Spinal stenosis    Thyroid nodule    Vertigo     Surgical History: Past Surgical History:  Procedure Laterality Date   APPENDECTOMY     BACK SURGERY     COLONOSCOPY N/A 08/15/2014   Procedure: COLONOSCOPY;  Surgeon: Scot Jun, MD;  Location: [facility] ENDOSCOPY;  Service: Endoscopy;  Laterality: N/A;   ESOPHAGOGASTRODUODENOSCOPY N/A 08/15/2014   Procedure: ESOPHAGOGASTRODUODENOSCOPY (EGD);  Surgeon: Scot Jun, MD;  Location: [facility] ENDOSCOPY;  Service:  Endoscopy;  Laterality: N/A;   IR KYPHO LUMBAR INC FX REDUCE BONE BX UNI/BIL CANNULATION INC/IMAGING  10/22/2022   IR RADIOLOGIST EVAL & MGMT  10/15/2022   IR RADIOLOGIST EVAL & MGMT  11/03/2022   IR RADIOLOGIST EVAL & MGMT  11/05/2022   TONSILLECTOMY      Home Medications:  Allergies as of 01/19/2023       Reactions   Aricept [donepezil]    Hallucination, confusion   Ciprofloxacin Other (See Comments)   Erythromycin Swelling   Keflex [cephalexin] Swelling   Penicillamine Other (See Comments)   Penicillins Swelling   Has patient had a PCN reaction causing immediate rash, facial/tongue/throat swelling, SOB or lightheadedness with hypotension: Yes Has patient had a PCN reaction causing severe rash involving mucus membranes or skin necrosis: No Has patient had a PCN reaction that required hospitalization: Unknown Has patient had a PCN reaction occurring within the last 10 years: No If all of the above answers are "NO", then may proceed with Cephalosporin use.        Medication List        Accurate as of January 19, 2023 10:40 AM. If you have any questions, ask your nurse or doctor.          B-12 5000 MCG Caps Take 5,000 mg by mouth daily.   buPROPion 75 MG tablet Commonly known as: WELLBUTRIN Take 150 mg by mouth daily with breakfast.   cholecalciferol 10 MCG (400 UNIT) Tabs tablet Commonly known as: VITAMIN D3 Take 400 Units by mouth daily.   CO Q 10 PO Take 1 Dose by mouth daily.   docusate sodium 100 MG capsule Commonly known as: COLACE Take 300 mg by mouth at bedtime.   Eliquis 2.5 MG Tabs tablet Generic drug: apixaban Take 1 tablet by mouth every 12 (twelve) hours.   esomeprazole 40 MG capsule Commonly known as: NEXIUM Take 1 capsule by mouth daily.   finasteride 5 MG tablet Commonly known as: PROSCAR Take 1 tablet (5 mg total) by mouth daily.   FLUoxetine HCl 60 MG Tabs Take 1 tablet by mouth at bedtime. For depression   folic acid 1 MG  tablet Commonly known as: FOLVITE Take 1 tablet (1 mg total) by mouth daily.   gabapentin 300 MG capsule Commonly known as: NEURONTIN 300 mg daily and 600 mg at night   HYDROcodone-acetaminophen 5-325 MG tablet Commonly known as: NORCO/VICODIN Take 1 tablet by mouth.   memantine 5 MG tablet  Commonly known as: NAMENDA 5 mg 2 (two) times daily.   niacinamide 100 MG tablet Take 100 mg by mouth in the morning.   nitrofurantoin (macrocrystal-monohydrate) 100 MG capsule Commonly known as: MACROBID Take 100 mg by mouth 2 (two) times daily.   ondansetron 4 MG disintegrating tablet Commonly known as: ZOFRAN-ODT Take 1 tablet (4 mg total) by mouth every 8 (eight) hours as needed for nausea or vomiting.   OVER THE COUNTER MEDICATION Take 1 tablet by mouth daily. ZINC   simvastatin 40 MG tablet Commonly known as: ZOCOR Take 40 mg by mouth daily.   tamsulosin 0.4 MG Caps capsule Commonly known as: FLOMAX Take 1 capsule (0.4 mg total) by mouth daily.   triamcinolone 0.1 % paste Commonly known as: KENALOG 1 Application 2 (two) times daily.        Allergies:  Allergies  Allergen Reactions   Aricept [Donepezil]     Hallucination, confusion   Ciprofloxacin Other (See Comments)   Erythromycin Swelling   Keflex [Cephalexin] Swelling   Penicillamine Other (See Comments)   Penicillins Swelling    Has patient had a PCN reaction causing immediate rash, facial/tongue/throat swelling, SOB or lightheadedness with hypotension: Yes Has patient had a PCN reaction causing severe rash involving mucus membranes or skin necrosis: No Has patient had a PCN reaction that required hospitalization: Unknown Has patient had a PCN reaction occurring within the last 10 years: No If all of the above answers are "NO", then may proceed with Cephalosporin use.     Family History: Family History  Problem Relation Age of Onset   Stroke Mother    COPD Father     Social History:  reports that he  quit smoking about 64 years ago. His smoking use included cigarettes. He has never used smokeless tobacco. He reports current alcohol use of about 1.0 standard drink of alcohol per week. He reports that he does not use drugs.  ROS: Pertinent ROS in HPI  Physical Exam: BP 114/69   Pulse 66   Constitutional:  Well nourished. Alert and oriented, No acute distress. HEENT: [place] AT, moist mucus membranes.  Trachea midline Cardiovascular: No clubbing, cyanosis, or edema. Respiratory: Normal respiratory effort, no increased work of breathing. Neurologic: Grossly intact, no focal deficits, moving all 4 extremities.  In wheel chair  Psychiatric: Normal mood and affect.    Laboratory Data: CBC w/auto Differential (5 Part) Order: [id number] Component Ref Range & Units 1 mo ago  WBC (White Blood Cell Count) 4.1 - 10.2 10^3/uL 9.2  RBC (Red Blood Cell Count) 4.69 - 6.13 10^6/uL 4.41 Low   Hemoglobin 14.1 - 18.1 gm/dL 40.9  Hematocrit 81.1 - 52.0 % 41.7  MCV (Mean Corpuscular Volume) 80.0 - 100.0 fl 94.6  MCH (Mean Corpuscular Hemoglobin) 27.0 - 31.2 pg 32.0 High   MCHC (Mean Corpuscular Hemoglobin Concentration) 32.0 - 36.0 gm/dL 91.4  Platelet Count 782 - 450 10^3/uL 245  RDW-CV (Red Cell Distribution Width) 11.6 - 14.8 % 12.4  MPV (Mean Platelet Volume) 9.4 - 12.4 fl 10.1  Neutrophils 1.50 - 7.80 10^3/uL 5.91  Lymphocytes 1.00 - 3.60 10^3/uL 1.93  Monocytes 0.00 - 1.50 10^3/uL 0.91  Eosinophils 0.00 - 0.55 10^3/uL 0.31  Basophils 0.00 - 0.09 10^3/uL 0.03  Neutrophil % 32.0 - 70.0 % 64.5  Lymphocyte % 10.0 - 50.0 % 21.1  Monocyte % 4.0 - 13.0 % 9.9  Eosinophil % 1.0 - 5.0 % 3.4  Basophil% 0.0 - 2.0 % 0.3  Immature Granulocyte % <=  0.7 % 0.8 High   Immature Granulocyte Count <=0.06 10^3/L 0.07 High   Resulting Agency [facility] - [facility]   Specimen Collected: 12/09/22 10:15   Performed by: [facility] Last Resulted: 12/09/22 15:21  Received  From: [facility]  Result Received: 12/23/22 13:58    Comprehensive Metabolic Panel (CMP) Order: [id number] Component Ref Range & Units 1 mo ago  Glucose 70 - 110 mg/dL 88  Sodium 725 - 366 mmol/L 139  Potassium 3.6 - 5.1 mmol/L 3.6  Chloride 97 - 109 mmol/L 102  Carbon Dioxide (CO2) 22.0 - 32.0 mmol/L 26.8  Urea Nitrogen (BUN) 7 - 25 mg/dL 12  Creatinine 0.7 - 1.3 mg/dL 0.9  Glomerular Filtration Rate (eGFR) >60 mL/min/1.73sq m 85  Comment: CKD-EPI (2021) does not include patient's race in the calculation of eGFR.  Monitoring changes of plasma creatinine and eGFR over time is useful for monitoring kidney function.  Interpretive Ranges for eGFR (CKD-EPI 2021):  eGFR:       >60 mL/min/1.73 sq. m - Normal eGFR:       30-59 mL/min/1.73 sq. m - Moderately Decreased eGFR:       15-29 mL/min/1.73 sq. m  - Severely Decreased eGFR:       < 15 mL/min/1.73 sq. m  - Kidney Failure   Note: These eGFR calculations do not apply in acute situations when eGFR is changing rapidly or patients on dialysis.  Calcium 8.7 - 10.3 mg/dL 9.0  AST 8 - 39 U/L 12  ALT 6 - 57 U/L 11  Alk Phos (alkaline Phosphatase) 34 - 104 U/L 83  Albumin 3.5 - 4.8 g/dL 3.7  Bilirubin, Total 0.3 - 1.2 mg/dL 0.5  Protein, Total 6.1 - 7.9 g/dL 6.2  A/G Ratio 1.0 - 5.0 gm/dL 1.5  Resulting Agency [facility]   Specimen Collected: 12/09/22 10:15   Performed by: [facility] Last Resulted: 12/09/22 16:08  Received From: [facility]  Result Received: 12/23/22 13:58    Hemoglobin A1C Order: [id number] Component Ref Range & Units 1 mo ago  Hemoglobin A1C 4.2 - 5.6 % 5.8 High   Average Blood Glucose (Calc) mg/dL 956  Resulting Agency [facility]  Narrative Performed by [facility] Normal Range:    4.2 - 5.6% Increased Risk:  5.7 - 6.4% Diabetes:        >= 6.5% Glycemic Control for adults with diabetes:  <7%     Specimen Collected: 12/09/22 10:15   Performed by: [facility] Last Resulted: 12/09/22 13:54  Received From: [facility]  Result Received: 12/23/22 13:58   Lipid Panel w/calc LDL Order: [id number] Component Ref Range & Units 1 mo ago  Cholesterol, Total 100 - 200 mg/dL 951  Triglyceride 35 - 199 mg/dL 884  HDL (High Density Lipoprotein) Cholesterol 29.0 - 71.0 mg/dL 16.6  LDL Calculated 0 - 130 mg/dL 90  VLDL Cholesterol mg/dL 30  Cholesterol/HDL Ratio 4.1  Resulting Agency [facility]   Specimen Collected: 12/09/22 10:15   Performed by: [facility] Last Resulted: 12/09/22 16:12  Received From: [facility]  Result Received: 12/23/22 13:58   Thyroid Stimulating-Hormone (TSH) Order: [id number] Component Ref Range & Units 1 mo ago  Thyroid Stimulating Hormone (TSH) 0.450-5.330 uIU/ml uIU/mL 1.215  Resulting Agency [facility] - LAB   Specimen Collected: 12/09/22  10:15   Performed by: [facility] Last Resulted: 12/09/22 13:31  Received From: [facility]  Result Received: 12/23/22 13:58   PSA, Total (Screen) Order: [id number] Component Ref Range & Units 1 mo ago  PSA (Prostate Specific Antigen), Total 0.10 - 4.00 ng/mL 0.20  Resulting Agency [facility]  Narrative Performed by [facility] - [facility] Test results were determined with Beck[employer] Coulter Hybritech Assay. Values obtained with different assay methods cannot be used interchangeably in serial testing. Assay results should not be interpreted as absolute evidence of the presence or absence of malignant disease  Specimen Collected: 12/09/22 10:15   Performed by: [facility] Last Resulted: 12/09/22 13:30  Received From: [facility]  Result Received: 12/23/22 13:58   Urinalysis Urinalysis w/Microscopic Order: [id number] Component Ref Range &  Units 1 mo ago  Color Colorless, Straw, Light Yellow, Yellow, Dark Yellow Yellow  Clarity Clear Clear  Specific Gravity 1.005 - 1.030 1.028  pH, Urine 5.0 - 8.0 5.0  Protein, Urinalysis Negative mg/dL Negative  Glucose, Urinalysis Negative mg/dL Negative  Ketones, Urinalysis Negative mg/dL Negative  Blood, Urinalysis Negative Negative  Nitrite, Urinalysis Negative Negative  Leukocyte Esterase, Urinalysis Negative Negative  Bilirubin, Urinalysis Negative Negative  Urobilinogen, Urinalysis 0.2 - 1.0 mg/dL 0.2  Mucous, Urine None Seen PRESENT Abnormal   WBC, UA <=5 /hpf 1  Red Blood Cells, Urinalysis <=3 /hpf 1  Bacteria, Urinalysis 0 - 5 /hpf 0-5  Squamous Epithelial Cells, Urinalysis /hpf 0  Resulting Agency [facility]   Specimen Collected: 12/09/22 10:15   Performed by: [facility] Last Resulted: 12/09/22 11:25  Received From: [facility]  Result Received: 12/23/22 13:58  I have reviewed the labs.   Pertinent Imaging:  01/19/23 10:12  Scan Result 257 ml    Assessment & Plan:    1. BPH with LU TS  -PSA (11/2022) 0.20 -continue tamsulosin and finasteride  2. rUTI's -continue macrobid 100 mg daily   Return in about 1 year (around 01/19/2024) for I PSS and PVR .  These notes generated with voice recognition software. I apologize for typographical errors.  Cloretta Ned  [facility] [address]  [address] [place], [place] [address] [phone]

## 2023-01-19 ENCOUNTER — Encounter: Payer: Self-pay | Admitting: Urology

## 2023-01-19 ENCOUNTER — Ambulatory Visit (INDEPENDENT_AMBULATORY_CARE_PROVIDER_SITE_OTHER): Payer: Medicare Other | Admitting: Urology

## 2023-01-19 VITALS — BP 114/69 | HR 66

## 2023-01-19 DIAGNOSIS — R319 Hematuria, unspecified: Secondary | ICD-10-CM

## 2023-01-19 DIAGNOSIS — N401 Enlarged prostate with lower urinary tract symptoms: Secondary | ICD-10-CM

## 2023-01-19 DIAGNOSIS — N390 Urinary tract infection, site not specified: Secondary | ICD-10-CM

## 2023-01-19 DIAGNOSIS — R3989 Other symptoms and signs involving the genitourinary system: Secondary | ICD-10-CM

## 2023-01-19 LAB — BLADDER SCAN AMB NON-IMAGING: Scan Result: 257

## 2023-02-15 ENCOUNTER — Other Ambulatory Visit: Payer: Self-pay | Admitting: Urology

## 2023-03-09 ENCOUNTER — Other Ambulatory Visit: Payer: Self-pay | Admitting: Psychiatry

## 2023-03-09 DIAGNOSIS — F3342 Major depressive disorder, recurrent, in full remission: Secondary | ICD-10-CM

## 2023-03-10 ENCOUNTER — Telehealth: Payer: Self-pay | Admitting: Psychiatry

## 2023-03-10 DIAGNOSIS — F3342 Major depressive disorder, recurrent, in full remission: Secondary | ICD-10-CM

## 2023-03-10 MED ORDER — FLUOXETINE HCL 20 MG PO CAPS: 20.0000 mg | ORAL_CAPSULE | Freq: Every day | ORAL | 3 refills | Status: DC

## 2023-03-10 MED ORDER — FLUOXETINE HCL 40 MG PO CAPS: 40.0000 mg | ORAL_CAPSULE | Freq: Every day | ORAL | Status: DC

## 2023-03-10 NOTE — Telephone Encounter (Signed)
I have sent fluoxetine 20 mg to pharmacy.

## 2023-03-10 NOTE — Telephone Encounter (Signed)
Called pharmacy as instructed by provider spoke to [place] she stated that he is on Fluoxetine 20mg  and also 40 mg the 20 mg was last filled on 12/02/22 90 tablets and the 40 mg was filled on 11/30/22 for 90 tablets and also filled again on 12/25/22 she is not sure why it was filled so soon but he was given another 90 tablets he does not have any refills on file for the 20 mg and doesn't need a refill for the 40 mg. S. Eappen was the last provider to send the medication to the pharmacy

## 2023-03-10 NOTE — Telephone Encounter (Signed)
Called patient to make aware that his fluoxetine 20 mg had been sent to the pharmacy

## 2023-05-31 ENCOUNTER — Other Ambulatory Visit: Payer: Self-pay | Admitting: Psychiatry

## 2023-05-31 DIAGNOSIS — F3342 Major depressive disorder, recurrent, in full remission: Secondary | ICD-10-CM

## 2023-06-11 ENCOUNTER — Other Ambulatory Visit: Payer: Self-pay | Admitting: Psychiatry

## 2023-06-11 DIAGNOSIS — F3342 Major depressive disorder, recurrent, in full remission: Secondary | ICD-10-CM

## 2023-06-29 ENCOUNTER — Ambulatory Visit: Payer: Self-pay | Admitting: Psychiatry

## 2023-07-23 ENCOUNTER — Telehealth: Payer: Self-pay | Admitting: Psychiatry

## 2023-07-23 NOTE — Telephone Encounter (Signed)
 He is not due for fluoxetine .  Please clarify with patient as well as pharmacy.

## 2023-07-23 NOTE — Telephone Encounter (Signed)
 Please clarify with patient as well as pharmacy whether he is in need of his fluoxetine .

## 2023-07-26 NOTE — Telephone Encounter (Signed)
 pt wife states that he ok on medication.

## 2023-07-27 ENCOUNTER — Encounter: Payer: Self-pay | Admitting: Psychiatry

## 2023-07-27 ENCOUNTER — Ambulatory Visit (INDEPENDENT_AMBULATORY_CARE_PROVIDER_SITE_OTHER): Payer: Self-pay | Admitting: Psychiatry

## 2023-07-27 ENCOUNTER — Other Ambulatory Visit: Payer: Self-pay

## 2023-07-27 VITALS — BP 112/74 | HR 86 | Temp 97.7°F | Ht 76.0 in | Wt 230.0 lb

## 2023-07-27 DIAGNOSIS — F330 Major depressive disorder, recurrent, mild: Secondary | ICD-10-CM

## 2023-07-27 DIAGNOSIS — G4701 Insomnia due to medical condition: Secondary | ICD-10-CM

## 2023-07-27 DIAGNOSIS — G3184 Mild cognitive impairment, so stated: Secondary | ICD-10-CM | POA: Diagnosis not present

## 2023-07-27 DIAGNOSIS — Z8659 Personal history of other mental and behavioral disorders: Secondary | ICD-10-CM

## 2023-07-27 MED ORDER — FLUOXETINE HCL 40 MG PO CAPS: 80.0000 mg | ORAL_CAPSULE | Freq: Every day | ORAL | 0 refills | Status: DC

## 2023-07-27 NOTE — Progress Notes (Unsigned)
 BH MD OP Progress Note  07/27/2023 5:10 PM Mark [name].  MRN:  [id number]  Chief Complaint:  Chief Complaint  Patient presents with   Follow-up   Depression   Insomnia   Medication Refill   Discussed the use of AI scribe software for clinical note transcription with the patient, who gave verbal consent to proceed.  History of Present Illness Mark [name]. is an 83 year old Caucasian male, lives in [place], has a history of MDD, insomnia, cognitive disorder, BPH, RLS, obstructive sleep apnea noncompliant with CPAP, history of falls, history of recurrent UTIs was evaluated in office today.  Patient presented along with spouse, Gregary Lean who provided collateral information.  Patient being a limited historian majority of information was obtained from spouse.  He has been experiencing significant depression, which worsened about two weeks ago, coinciding with the start of home health physical therapy. His depression is exacerbated by mobility challenges, particularly difficulty transitioning from a wheelchair to a walker due to left knee instability. He has been confined to a wheelchair since July of the previous year. Current medications for depression include fluoxetine  60 mg daily and bupropion  75 mg once daily. Gabapentin is taken at 300 mg twice daily, reduced from a higher dose due to a previous episode of psychosis.  He experiences mild to moderate memory changes, with difficulties in recalling recent events and a tendency to repeat things. He is under the care of a neurologist and has an upcoming appointment at the end of the month. His memory was assessed with a short-term memory test and a serial subtraction task, which he found challenging.  He was able to give the the month and the year but not the date.  He appeared to be alert and oriented to person place and situation.  His physical activity is limited due to mobility issues, spending most of his time sitting in a chair, reading,  and using his iPad and phone. He previously enjoyed gardening and volunteering as a Radiation protection practitioner. Sleep and appetite are reported as good.  He currently denies any suicidality, homicidality or perceptual disturbances.   Visit Diagnosis:    ICD-10-CM   1. MDD (major depressive disorder), recurrent episode, mild (HCC)  F33.0 FLUoxetine  (PROZAC ) 40 MG capsule    2. Insomnia due to medical condition  G47.01    Depression, pain    3. MCI (mild cognitive impairment)  G31.84     4. Hx of psychosis  Z86.59       Past Psychiatric History: I have reviewed past psychiatric history from progress note on 04/20/2018.  Past trials of Prozac , Wellbutrin .  Past Medical History:  Past Medical History:  Diagnosis Date   Anxiety    Atrial fibrillation (HCC)    Back pain, chronic    Cancer (HCC)    skin   Colon polyp    Cyst of left kidney    Dementia (HCC)    GERD (gastroesophageal reflux disease)    Heart murmur    Hyperlipemia    Mitral valve prolapse    Neuromuscular disorder (HCC)    Neuropathy    Osteoarthritis    Pulmonary emboli (HCC)    Spinal stenosis    Thyroid  nodule    Vertigo     Past Surgical History:  Procedure Laterality Date   APPENDECTOMY     BACK SURGERY     COLONOSCOPY N/A 08/15/2014   Procedure: COLONOSCOPY;  Surgeon: Cassie Click, MD;  Location: [facility] ENDOSCOPY;  Service: Endoscopy;  Laterality: N/A;   ESOPHAGOGASTRODUODENOSCOPY N/A 08/15/2014   Procedure: ESOPHAGOGASTRODUODENOSCOPY (EGD);  Surgeon: Cassie Click, MD;  Location: [facility] ENDOSCOPY;  Service: Endoscopy;  Laterality: N/A;   IR KYPHO LUMBAR INC FX REDUCE BONE BX UNI/BIL CANNULATION INC/IMAGING  10/22/2022   IR RADIOLOGIST EVAL & MGMT  10/15/2022   IR RADIOLOGIST EVAL & MGMT  11/03/2022   IR RADIOLOGIST EVAL & MGMT  11/05/2022   TONSILLECTOMY      Family Psychiatric History: I have reviewed family psychiatric history from progress note on 04/20/2018.  Family History:  Family History  Problem Relation  Age of Onset   Stroke Mother    COPD Father     Social History: I have reviewed social history from progress note on 04/20/2018. Social History   Socioeconomic History   Marital status: Married    Spouse name: Mark [name]   Number of children: 2   Years of education: Not on file   Highest education level: Master's degree (e.g., MA, MS, MEng, MEd, MSW, MBA)  Occupational History   Not on file  Tobacco Use   Smoking status: Former    Current packs/day: 0.00    Types: Cigarettes    Quit date: 04/20/1958    Years since quitting: 65.3   Smokeless tobacco: Never  Vaping Use   Vaping status: Never Used  Substance and Sexual Activity   Alcohol use: Yes    Alcohol/week: 1.0 standard drink of alcohol    Types: 1 Glasses of wine per week   Drug use: Never   Sexual activity: Not Currently  Other Topics Concern   Not on file  Social History Narrative   Not on file   Social Drivers of Health   Financial Resource Strain: Low Risk  (12/16/2022)   Received from [facility]   Overall Financial Resource Strain (CARDIA)    Difficulty of Paying Living Expenses: Not hard at all  Food Insecurity: No Food Insecurity (12/16/2022)   Received from [facility]   Hunger Vital Sign    Ran Out of Food in the Last Year: Never true    Worried About Running Out of Food in the Last Year: Never true  Transportation Needs: No Transportation Needs (12/16/2022)   Received from [facility]   PRAPARE - Transportation    Lack of Transportation (Non-Medical): No    In the past 12 months, has lack of transportation kept you from medical appointments or from getting medications?: No  Physical Activity: Insufficiently Active (04/20/2018)   Exercise Vital Sign    Days of Exercise per Week: 2 days    Minutes of Exercise per Session: 60 min  Stress: No Stress Concern Present (04/20/2018)   [employer] of Occupational Health - Occupational Stress Questionnaire     Feeling of Stress : Not at all  Social Connections: Unknown (04/20/2018)   Social Connection and Isolation Panel [NHANES]    Frequency of Communication with Friends and Family: Not on file    Frequency of Social Gatherings with Friends and Family: Not on file    Attends Religious Services: More than 4 times per year    Active Member of [employer] or Organizations: Yes    Attends Banker Meetings: More than 4 times per year    Marital Status: Married    Allergies:  Allergies  Allergen Reactions   Aricept [Donepezil]     Hallucination, confusion   Ciprofloxacin Other (See Comments)   Erythromycin  Swelling   Keflex [Cephalexin] Swelling   Penicillamine Other (See Comments)   Penicillins Swelling    Has patient had a PCN reaction causing immediate rash, facial/tongue/throat swelling, SOB or lightheadedness with hypotension: Yes Has patient had a PCN reaction causing severe rash involving mucus membranes or skin necrosis: No Has patient had a PCN reaction that required hospitalization: Unknown Has patient had a PCN reaction occurring within the last 10 years: No If all of the above answers are "NO", then may proceed with Cephalosporin use.     Metabolic Disorder Labs: No results found for: "HGBA1C", "MPG" No results found for: "PROLACTIN" No results found for: "CHOL", "TRIG", "HDL", "CHOLHDL", "VLDL", "LDLCALC" Lab Results  Component Value Date   TSH 0.866 02/21/2018    Therapeutic Level Labs: No results found for: "LITHIUM" No results found for: "VALPROATE" No results found for: "CBMZ"  Current Medications: Current Outpatient Medications  Medication Sig Dispense Refill   apixaban  (ELIQUIS ) 2.5 MG TABS tablet Take 1 tablet by mouth every 12 (twelve) hours.     buPROPion  (WELLBUTRIN ) 75 MG tablet Take 75 mg by mouth daily with breakfast.     cholecalciferol (VITAMIN D3) 10 MCG (400 UNIT) TABS tablet Take 400 Units by mouth daily.     Coenzyme Q10 (CO Q 10 PO) Take 1  Dose by mouth daily.      Cyanocobalamin  (B-12) 5000 MCG CAPS Take 5,000 mg by mouth daily. 30 capsule 0   docusate sodium  (COLACE) 100 MG capsule Take 300 mg by mouth at bedtime.     esomeprazole (NEXIUM) 40 MG capsule Take 1 capsule by mouth daily.     finasteride  (PROSCAR ) 5 MG tablet TAKE 1 TABLET BY MOUTH DAILY 90 tablet 3   gabapentin (NEURONTIN) 300 MG capsule Take 300 mg by mouth 2 (two) times daily.     HYDROcodone -acetaminophen  (NORCO/VICODIN) 5-325 MG tablet Take 1 tablet by mouth.     memantine  (NAMENDA ) 5 MG tablet 5 mg 2 (two) times daily.     niacinamide 100 MG tablet Take 100 mg by mouth in the morning.     nitrofurantoin , macrocrystal-monohydrate, (MACROBID ) 100 MG capsule Take 100 mg by mouth 2 (two) times daily.     ondansetron  (ZOFRAN -ODT) 4 MG disintegrating tablet Take 1 tablet (4 mg total) by mouth every 8 (eight) hours as needed for nausea or vomiting. 20 tablet 0   simvastatin  (ZOCOR ) 40 MG tablet Take 40 mg by mouth daily.     tamsulosin  (FLOMAX ) 0.4 MG CAPS capsule Take 1 capsule (0.4 mg total) by mouth daily. 90 capsule 3   triamcinolone  (KENALOG) 0.1 % paste 1 Application 2 (two) times daily.     FLUoxetine  (PROZAC ) 40 MG capsule Take 2 capsules (80 mg total) by mouth daily. 180 capsule 0   Current Facility-Administered Medications  Medication Dose Route Frequency Provider Last Rate Last Admin   nystatin  (MYCOSTATIN /NYSTOP ) topical powder   Topical BID Dustin Gimenez, MD         Musculoskeletal: Strength & Muscle Tone:  limited Gait & Station:  In a wheelchair Patient leans: N/A  Psychiatric Specialty Exam: Review of Systems  Psychiatric/Behavioral:  Positive for dysphoric mood.     Blood pressure 112/74, pulse 86, temperature 97.7 F (36.5 C), temperature source Temporal, height 6\' 4"  (1.93 m), weight 230 lb (104.3 kg).Body mass index is 28 kg/m.  General Appearance: Casual  Eye Contact:  Fair  Speech:  Clear and Coherent  Volume:  Normal  Mood:   Depressed  Affect:  Congruent  Thought Process:  Goal Directed and Descriptions of Associations: Intact  Orientation:  Other:  Self, situation, month, year  Thought Content: Logical   Suicidal Thoughts:  No  Homicidal Thoughts:  No  Memory:  Immediate;   Fair Recent;   Fair Remote;   limited  Judgement:  Other:  Limited  Insight:  Shallow  Psychomotor Activity:  Normal  Concentration:  Concentration: Poor and Attention Span: Poor  Recall:  [employer] of Knowledge: Fair  Language: Fair  Akathisia:  No  Handed:  Right  AIMS (if indicated): not done  Assets:  Desire for Improvement Housing Social Support  ADL's:  Intact  Cognition: Limited  Sleep:  Fair   Screenings: AIMS    Flowsheet Row [facility] Visit from 02/05/2022 in [facility]  AIMS Total Score 0      GAD-7    Flowsheet Row Office Visit from 07/30/2022 in [facility]  Total GAD-7 Score 1      Mini-Mental    Flowsheet Row Office Visit from 07/30/2022 in [facility]  Total Score (max 30 points ) 29      PHQ2-9    Flowsheet Row Office Visit from 07/30/2022 in [facility] Video Visit from 03/05/2022 in [facility] Office Visit from 07/21/2021 in [facility] Office Visit from 12/10/2020 in [facility]  PHQ-2 Total Score 0 0 0 0  PHQ-9 Total Score 1 -- 0 --      Flowsheet Row Video Visit from 12/29/2022 in [facility] Video Visit from 09/28/2022 in [facility] ED to Hosp-Admission (Discharged) from 08/02/2022 in [facility] 1C MEDICAL TELEMETRY  C-SSRS RISK CATEGORY No Risk No Risk No Risk        Assessment and Plan: Tyrease Verble. s a  83 year old Caucasian male, currently with worsening depression symptoms, cognitive issues, limited mobility which complicates his condition, discussed assessment and plan as noted below.  Assessment & Plan Depression-unstable Mild depression with recent exacerbation likely related to mobility issues and left knee instability. Current medication includes fluoxetine  60 mg daily. Discussed increasing fluoxetine  to 80 mg daily as there is room for dosage adjustment. Wellbutrin  dosage cannot be increased due to previous psychosis episode. Discussed adding a new medication if fluoxetine  adjustment is insufficient. No current suicidal ideation. He is open to therapy for additional support. - Increase Fluoxetine  to 80 mg daily - Discontinue Fluoxetine  20 mg tablets. - Continue Wellbutrin  75 mg daily. - Consider referral to a therapist for cognitive behavioral therapy or other strategies if medication adjustment is insufficient. - Provided resources in the community for therapist.  Insomnia-stable Currently reports sleep is overall good. - Continue sleep hygiene techniques as well as pain management.  MCI with memory changes-unstable Mild to moderate memory changes with difficulty in short-term recall and calculation tasks. Engaged in cognitive exercises during the visit. Encouraged to engage in activities that stimulate cognitive function such as reading, puzzles, and word searches. - Encourage cognitive exercises such as reading, puzzles, and word searches.  Will recommend repeating platelet count, sodium and TSH labs.  Patient has upcoming appointment with primary care provider.  Collateral information obtained from spouse who was present in session today.  Follow-up Follow-up in clinic in 2 months or sooner if needed.  Consent: Patient/Guardian gives verbal consent for treatment and assignment of benefits for services provided during this visit. Patient/Guardian expressed understanding and  agreed to proceed.  This note was generated in part or whole with voice recognition software. Voice recognition is usually quite accurate but there are transcription errors that can and very often do occur. I apologize for any typographical errors that were not detected and corrected.     Loriel Diehl, MD 07/27/2023, 5:10 PM

## 2023-07-27 NOTE — Patient Instructions (Signed)
  [email or web address]  www.[email or web address]  [email or web address] [facility] [address], [place], [place] [address]   [phone]  [facility] [email or web address] [address], [place], [place] [address]  [phone]  [facility] [email or web address] [address], [place], [place] [address]  [phone]   Family solutions - [phone]  [facility] - [phone]  [facility] - [phone] counseling [phone]  [facility] [phone]   [email or web address] this clinician can offer telehealth and has a sliding scale option  [email or web address] this group also offers sliding scale rates and is based out of [place]  Dr. Liborio Nixon with the [facility] specializes in divorce  [employer] [facility] has interns who offer sliding scale rates and some of the full time clinicians do, as well. You complete their contact form on their website and the referrals coordinator will help to get connected to someone   [email or web address] [phone]  Medicaid below :  [facility] [address] [place], [place] [address]  [phone]    [employer] [address] [place], [place] [address]  [phone]    [facility] [address] [place], [place] [address]  [phone]

## 2023-08-30 ENCOUNTER — Ambulatory Visit: Payer: Self-pay | Admitting: Psychiatry

## 2023-09-30 ENCOUNTER — Encounter: Payer: Self-pay | Admitting: Psychiatry

## 2023-09-30 ENCOUNTER — Telehealth (INDEPENDENT_AMBULATORY_CARE_PROVIDER_SITE_OTHER): Admitting: Psychiatry

## 2023-09-30 DIAGNOSIS — Z8659 Personal history of other mental and behavioral disorders: Secondary | ICD-10-CM

## 2023-09-30 DIAGNOSIS — F330 Major depressive disorder, recurrent, mild: Secondary | ICD-10-CM

## 2023-09-30 DIAGNOSIS — F3342 Major depressive disorder, recurrent, in full remission: Secondary | ICD-10-CM | POA: Diagnosis not present

## 2023-09-30 DIAGNOSIS — G4701 Insomnia due to medical condition: Secondary | ICD-10-CM

## 2023-09-30 DIAGNOSIS — G3184 Mild cognitive impairment, so stated: Secondary | ICD-10-CM | POA: Diagnosis not present

## 2023-09-30 NOTE — Progress Notes (Signed)
 Virtual Visit via Video Note  I connected with Mark FORBES Malachy [name] on 09/30/23 at  1:00 PM EDT by a video enabled telemedicine application and verified that I am speaking with the correct person using two identifiers.  Location Provider Location : [facility] Patient Location : Home  Participants: Patient ,Spouse, Provider   I discussed the limitations of evaluation and management by telemedicine and the availability of in person appointments. The patient expressed understanding and agreed to proceed.   I discussed the assessment and treatment plan with the patient. The patient was provided an opportunity to ask questions and all were answered. The patient agreed with the plan and demonstrated an understanding of the instructions.   The patient was advised to call back or seek an in-person evaluation if the symptoms worsen or if the condition fails to improve as anticipated.    BH MD OP Progress Note  10/01/2023 8:03 AM Mark FORBES Malachy [name]  MRN:  [id number]  Chief Complaint:  Chief Complaint  Patient presents with   Follow-up   Anxiety   Depression   Medication Refill   Discussed the use of AI scribe software for clinical note transcription with the patient, who gave verbal consent to proceed.  History of Present Illness Mark Berhane. is an 83 year old Caucasian male, lives in [place], has a history of MDD, insomnia, cognitive disorder, BPH, RLS, obstructive sleep apnea noncompliant with CPAP, history of falls, history of recurrent UTIs was evaluated by telemedicine today.  Since his last visit, his fluoxetine  dose was increased to 80 mg, and he feels that he is doing well. He had been struggling with depression due to physical health issues and had  started home health physical therapy. He now reports feeling 'better' and is able to sleep at least eight hours a night, though Mark [name], his wife has to 'drag him out of bed in the morning.' He engages in cognitive activities such as word puzzles  and reading, and he is able to comprehend what he reads.  He is currently using a wheelchair, although Mark [name] mentions that he has been up on his walker a couple of times with the help of home health physical therapy, which now occurs twice a week.  He had a urinary tract infection (UTI) since the last visit, for which he received a shot and completed a seven-day course of doxycycline. He did not experience any hallucinations, agitation, or sleep issues during this episode, unlike previous UTIs.  He currently denies any suicidality, homicidality or perceptual disturbances.  Denies any other concerns today.  Patient being a limited historian majority of information was obtained from spouse.    Visit Diagnosis:    ICD-10-CM   1. Recurrent major depressive disorder, in full remission (HCC)  F33.42     2. Insomnia due to medical condition  G47.01    Depression, pain    3. MCI (mild cognitive impairment)  G31.84     4. Hx of psychosis  Z86.59       Past Psychiatric History: I have reviewed past psychiatric history from progress note on 04/20/2018.  Past trials of Prozac , Wellbutrin .  Past Medical History:  Past Medical History:  Diagnosis Date   Anxiety    Atrial fibrillation (HCC)    Back pain, chronic    Cancer (HCC)    skin   Colon polyp    Cyst of left kidney    Dementia (HCC)    GERD (gastroesophageal reflux disease)  Heart murmur    Hyperlipemia    Mitral valve prolapse    Neuromuscular disorder (HCC)    Neuropathy    Osteoarthritis    Pulmonary emboli (HCC)    Spinal stenosis    Thyroid  nodule    Vertigo     Past Surgical History:  Procedure Laterality Date   APPENDECTOMY     BACK SURGERY     COLONOSCOPY N/A 08/15/2014   Procedure: COLONOSCOPY;  Surgeon: Lamar ONEIDA Holmes, MD;  Location: [facility] ENDOSCOPY;  Service: Endoscopy;  Laterality: N/A;   ESOPHAGOGASTRODUODENOSCOPY N/A 08/15/2014   Procedure: ESOPHAGOGASTRODUODENOSCOPY (EGD);  Surgeon: Lamar ONEIDA Holmes, MD;   Location: [facility] ENDOSCOPY;  Service: Endoscopy;  Laterality: N/A;   IR KYPHO LUMBAR INC FX REDUCE BONE BX UNI/BIL CANNULATION INC/IMAGING  10/22/2022   IR RADIOLOGIST EVAL & MGMT  10/15/2022   IR RADIOLOGIST EVAL & MGMT  11/03/2022   IR RADIOLOGIST EVAL & MGMT  11/05/2022   TONSILLECTOMY      Family Psychiatric History: I have reviewed family psychiatric history from progress note on 04/20/2018.  Family History:  Family History  Problem Relation Age of Onset   Stroke Mother    COPD Father     Social History: I have reviewed social history from progress note on 04/20/2018. Social History   Socioeconomic History   Marital status: Married    Spouse name: Mark [name]   Number of children: 2   Years of education: Not on file   Highest education level: Master's degree (e.g., MA, MS, MEng, MEd, MSW, MBA)  Occupational History   Not on file  Tobacco Use   Smoking status: Former    Current packs/day: 0.00    Types: Cigarettes    Quit date: 04/20/1958    Years since quitting: 65.4   Smokeless tobacco: Never  Vaping Use   Vaping status: Never Used  Substance and Sexual Activity   Alcohol use: Yes    Alcohol/week: 1.0 standard drink of alcohol    Types: 1 Glasses of wine per week   Drug use: Never   Sexual activity: Not Currently  Other Topics Concern   Not on file  Social History Narrative   Not on file   Social Drivers of Health   Financial Resource Strain: Low Risk  (12/16/2022)   Received from [facility]   Overall Financial Resource Strain (CARDIA)    Difficulty of Paying Living Expenses: Not hard at all  Food Insecurity: No Food Insecurity (12/16/2022)   Received from [facility]   Hunger Vital Sign    Within the past 12 months, the food you bought just didn't last and you didn't have money to get more.: Never true    Within the past 12 months, you worried that your food would run out before you got the money to buy more.: Never true   Transportation Needs: No Transportation Needs (12/16/2022)   Received from [facility]   PRAPARE - Transportation    Lack of Transportation (Non-Medical): No    In the past 12 months, has lack of transportation kept you from medical appointments or from getting medications?: No  Physical Activity: Insufficiently Active (04/20/2018)   Exercise Vital Sign    Days of Exercise per Week: 2 days    Minutes of Exercise per Session: 60 min  Stress: No Stress Concern Present (04/20/2018)   [employer] of Occupational Health - Occupational Stress Questionnaire    Feeling of Stress : Not at  all  Social Connections: Unknown (04/20/2018)   Social Connection and Isolation Panel    Frequency of Communication with Friends and Family: Not on file    Frequency of Social Gatherings with Friends and Family: Not on file    Attends Religious Services: More than 4 times per year    Active Member of [employer] or Organizations: Yes    Attends Banker Meetings: More than 4 times per year    Marital Status: Married    Allergies:  Allergies  Allergen Reactions   Aricept [Donepezil]     Hallucination, confusion   Ciprofloxacin Other (See Comments)   Erythromycin Swelling   Keflex [Cephalexin] Swelling   Penicillamine Other (See Comments)   Penicillins Swelling    Has patient had a PCN reaction causing immediate rash, facial/tongue/throat swelling, SOB or lightheadedness with hypotension: Yes Has patient had a PCN reaction causing severe rash involving mucus membranes or skin necrosis: No Has patient had a PCN reaction that required hospitalization: Unknown Has patient had a PCN reaction occurring within the last 10 years: No If all of the above answers are NO, then may proceed with Cephalosporin use.     Metabolic Disorder Labs: No results found for: HGBA1C, MPG No results found for: PROLACTIN No results found for: CHOL, TRIG, HDL, CHOLHDL, VLDL,  LDLCALC Lab Results  Component Value Date   TSH 0.866 02/21/2018    Therapeutic Level Labs: No results found for: LITHIUM No results found for: VALPROATE No results found for: CBMZ  Current Medications: Current Outpatient Medications  Medication Sig Dispense Refill   apixaban  (ELIQUIS ) 2.5 MG TABS tablet Take 1 tablet by mouth every 12 (twelve) hours.     buPROPion  (WELLBUTRIN ) 75 MG tablet Take 75 mg by mouth daily with breakfast.     cholecalciferol (VITAMIN D3) 10 MCG (400 UNIT) TABS tablet Take 400 Units by mouth daily.     Coenzyme Q10 (CO Q 10 PO) Take 1 Dose by mouth daily.      Cyanocobalamin  (B-12) 5000 MCG CAPS Take 5,000 mg by mouth daily. 30 capsule 0   docusate sodium  (COLACE) 100 MG capsule Take 300 mg by mouth at bedtime.     esomeprazole (NEXIUM) 40 MG capsule Take 1 capsule by mouth daily.     finasteride  (PROSCAR ) 5 MG tablet TAKE 1 TABLET BY MOUTH DAILY 90 tablet 3   FLUoxetine  (PROZAC ) 40 MG capsule Take 2 capsules (80 mg total) by mouth daily. 180 capsule 0   gabapentin (NEURONTIN) 300 MG capsule Take 300 mg by mouth 2 (two) times daily.     memantine  (NAMENDA ) 5 MG tablet 5 mg 2 (two) times daily.     niacinamide 100 MG tablet Take 100 mg by mouth in the morning.     nitrofurantoin , macrocrystal-monohydrate, (MACROBID ) 100 MG capsule Take 100 mg by mouth 2 (two) times daily.     ondansetron  (ZOFRAN -ODT) 4 MG disintegrating tablet Take 1 tablet (4 mg total) by mouth every 8 (eight) hours as needed for nausea or vomiting. 20 tablet 0   simvastatin  (ZOCOR ) 40 MG tablet Take 40 mg by mouth daily.     tamsulosin  (FLOMAX ) 0.4 MG CAPS capsule Take 1 capsule (0.4 mg total) by mouth daily. 90 capsule 3   triamcinolone  (KENALOG) 0.1 % paste 1 Application 2 (two) times daily.     Current Facility-Administered Medications  Medication Dose Route Frequency Provider Last Rate Last Admin   nystatin  (MYCOSTATIN /NYSTOP ) topical powder   Topical BID Penne Knee,  MD          Musculoskeletal: Strength & Muscle Tone: UTA Gait & Station: Seated Patient leans: N/A  Psychiatric Specialty Exam: Review of Systems  Unable to perform ROS: Age    There were no vitals taken for this visit.There is no height or weight on file to calculate BMI.  General Appearance: Casual  Eye Contact:  Fair  Speech:  Normal Rate  Volume:  Normal  Mood:  Euthymic  Affect:  Congruent  Thought Process:  Goal Directed and Descriptions of Associations: Intact  Orientation:  Full (Time, Place, and Person)  Thought Content: Logical   Suicidal Thoughts:  No  Homicidal Thoughts:  No  Memory:  Immediate;   Fair Recent;   limited Remote;   limited  Judgement:  Other:  shallow  Insight:  Shallow  Psychomotor Activity:  Decreased  Concentration:  Concentration: limited and Attention Span: limited  Recall:  Poor  Fund of Knowledge: Fair  Language: Fair  Akathisia:  No  Handed:  Right  AIMS (if indicated): not done  Assets:  Desire for Improvement Housing Social Support  ADL's:  with support  Cognition: limited  Sleep:  Fair   Screenings: Geneticist, molecular Office Visit from 02/05/2022 in [facility]  AIMS Total Score 0   GAD-7    Flowsheet Row Office Visit from 07/27/2023 in [facility] Office Visit from 07/30/2022 in [facility]  Total GAD-7 Score 7 1   Mini-Mental    Flowsheet Row Office Visit from 07/30/2022 in [facility]  Total Score (max 30 points ) 29   PHQ2-9    Flowsheet Row Office Visit from 07/27/2023 in [facility] Office Visit from 07/30/2022 in [facility] Video Visit from 03/05/2022 in [facility] Office Visit from 07/21/2021 in [facility] Office Visit from 12/10/2020 in [facility]  PHQ-2 Total Score 4 0 0 0 0  PHQ-9 Total Score 12 1 -- 0 --   Flowsheet Row Video Visit from 09/30/2023 in [facility] Office Visit from 07/27/2023 in [facility] Video Visit from 12/29/2022 in [facility]  C-SSRS RISK CATEGORY No Risk No Risk No Risk     Assessment and Plan: Mark FORBES Malachy [name] Is a 83 year old Caucasian male, currently compliant on medications, discussed assessment and plan as noted below.  Depression-stable Currently denies any significant depression symptoms, good response to the higher dosage of fluoxetine . Continue Fluoxetine  80 mg daily Continue Wellbutrin  75 mg daily Encouraged to establish care with therapist, provided resources for Ms. Ellouise Hummer.  Insomnia-stable Currently sleep is fair on the current medication regimen. Continue sleep hygiene techniques.  MCI with memory changes-chronic Patient continues to have memory problems, short-term memory loss.  Currently engaged in cognitive exercises. Encouraged to follow up with neurologist. Continue Namenda  as prescribed  Most recent labs reviewed and discussed dated 09/13/2023 CBC with differential-within normal limits including platelets, CMP sodium borderline range 135 otherwise within normal limits.  Collateral information obtained from spouse who was present in session.  Follow-up Follow-up in clinic in 6 months or sooner if needed.  Collaboration of Care: Collaboration of Care: Referral or follow-up with counselor/therapist AEB and courage to establish care with therapist.  Patient/Guardian  was advised Release of Information must be obtained prior to any record release in order to collaborate their care with an outside provider. Patient/Guardian was advised if they have not already done so to  contact the registration department to sign all necessary forms in order for us  to release information regarding their care.   Consent: Patient/Guardian gives verbal consent for treatment and assignment of benefits for services provided during this visit. Patient/Guardian expressed understanding and agreed to proceed.   This note was generated in part or whole with voice recognition software. Voice recognition is usually quite accurate but there are transcription errors that can and very often do occur. I apologize for any typographical errors that were not detected and corrected.    Amoree Newlon, MD 10/01/2023, 8:03 AM

## 2023-10-22 ENCOUNTER — Other Ambulatory Visit: Payer: Self-pay | Admitting: Internal Medicine

## 2023-10-22 DIAGNOSIS — G4752 REM sleep behavior disorder: Secondary | ICD-10-CM

## 2023-10-22 DIAGNOSIS — R410 Disorientation, unspecified: Secondary | ICD-10-CM

## 2023-10-22 DIAGNOSIS — R400 Somnolence: Secondary | ICD-10-CM

## 2023-10-22 DIAGNOSIS — R531 Weakness: Secondary | ICD-10-CM

## 2023-10-24 ENCOUNTER — Ambulatory Visit
Admission: RE | Admit: 2023-10-24 | Discharge: 2023-10-24 | Disposition: A | Discharge status: Home / Self Care | Source: Ambulatory Visit | Attending: Internal Medicine | Admitting: Internal Medicine

## 2023-10-24 DIAGNOSIS — R410 Disorientation, unspecified: Secondary | ICD-10-CM | POA: Insufficient documentation

## 2023-10-24 DIAGNOSIS — R400 Somnolence: Secondary | ICD-10-CM | POA: Diagnosis present

## 2023-10-24 DIAGNOSIS — G4752 REM sleep behavior disorder: Secondary | ICD-10-CM | POA: Insufficient documentation

## 2023-10-24 DIAGNOSIS — R531 Weakness: Secondary | ICD-10-CM | POA: Diagnosis present

## 2023-11-18 ENCOUNTER — Other Ambulatory Visit: Payer: Self-pay

## 2023-11-18 ENCOUNTER — Emergency Department

## 2023-11-18 ENCOUNTER — Emergency Department
Admission: EM | Admit: 2023-11-18 | Discharge: 2023-11-18 | Disposition: A | Discharge status: Home / Self Care | Source: Ambulatory Visit

## 2023-11-18 ENCOUNTER — Ambulatory Visit
Admission: RE | Admit: 2023-11-18 | Discharge: 2023-11-18 | Disposition: A | Discharge status: Home / Self Care | Source: Ambulatory Visit | Attending: Physician Assistant | Admitting: Physician Assistant

## 2023-11-18 ENCOUNTER — Other Ambulatory Visit: Payer: Self-pay | Admitting: Physician Assistant

## 2023-11-18 ENCOUNTER — Encounter: Payer: Self-pay | Admitting: Emergency Medicine

## 2023-11-18 DIAGNOSIS — R112 Nausea with vomiting, unspecified: Secondary | ICD-10-CM | POA: Diagnosis present

## 2023-11-18 DIAGNOSIS — R1084 Generalized abdominal pain: Secondary | ICD-10-CM

## 2023-11-18 DIAGNOSIS — R1030 Lower abdominal pain, unspecified: Secondary | ICD-10-CM | POA: Insufficient documentation

## 2023-11-18 DIAGNOSIS — R531 Weakness: Secondary | ICD-10-CM | POA: Insufficient documentation

## 2023-11-18 LAB — COMPREHENSIVE METABOLIC PANEL WITH GFR
ALT: 24 U/L (ref 0–44)
AST: 31 U/L (ref 15–41)
Albumin: 3.5 g/dL (ref 3.5–5.0)
Alkaline Phosphatase: 87 U/L (ref 38–126)
Anion gap: 14 (ref 5–15)
BUN: 10 mg/dL (ref 8–23)
CO2: 21 mmol/L — ABNORMAL LOW (ref 22–32)
Calcium: 9.6 mg/dL (ref 8.9–10.3)
Chloride: 103 mmol/L (ref 98–111)
Creatinine, Ser: 0.96 mg/dL (ref 0.61–1.24)
GFR, Estimated: >60 mL/min (ref >60)
Glucose, Bld: 121 mg/dL — ABNORMAL HIGH (ref 70–99)
Potassium: 3.8 mmol/L (ref 3.5–5.1)
Sodium: 138 mmol/L (ref 135–145)
Total Bilirubin: 1 mg/dL (ref 0.0–1.2)
Total Protein: 7.1 g/dL (ref 6.5–8.1)

## 2023-11-18 LAB — LACTIC ACID, PLASMA
Lactic Acid, Venous: 2.2 mmol/L (ref 0.5–1.9)
Lactic Acid, Venous: 1.5 mmol/L (ref 0.5–1.9)

## 2023-11-18 LAB — CBC
HCT: 47 % (ref 39.0–52.0)
Hemoglobin: 16.3 g/dL (ref 13.0–17.0)
MCH: 31.5 pg (ref 26.0–34.0)
MCHC: 34.7 g/dL (ref 30.0–36.0)
MCV: 90.9 fL (ref 80.0–100.0)
Platelets: 245 K/uL (ref 150–400)
RBC: 5.17 MIL/uL (ref 4.22–5.81)
RDW: 12.7 % (ref 11.5–15.5)
WBC: 11.8 K/uL — ABNORMAL HIGH (ref 4.0–10.5)
nRBC: 0 % (ref 0.0–0.2)

## 2023-11-18 LAB — URINALYSIS, ROUTINE W REFLEX MICROSCOPIC
Bacteria, UA: NONE SEEN
Bilirubin Urine: NEGATIVE
Glucose, UA: NEGATIVE mg/dL
Hgb urine dipstick: NEGATIVE
Ketones, ur: 5 mg/dL — AB
Leukocytes,Ua: NEGATIVE
Nitrite: NEGATIVE
Protein, ur: 30 mg/dL — AB
Specific Gravity, Urine: 1.016 (ref 1.005–1.030)
pH: 5 (ref 5.0–8.0)

## 2023-11-18 LAB — LIPASE, BLOOD: Lipase: 31 U/L (ref 11–51)

## 2023-11-18 LAB — MAGNESIUM: Magnesium: 2 mg/dL (ref 1.7–2.4)

## 2023-11-18 MED ORDER — SODIUM CHLORIDE 0.9 % IV BOLUS
500.0000 mL | Freq: Once | INTRAVENOUS | Status: AC
  Administered 2023-11-18: 500 mL via INTRAVENOUS

## 2023-11-18 MED ORDER — IOHEXOL 300 MG/ML  SOLN
100.0000 mL | Freq: Once | INTRAMUSCULAR | Status: AC | PRN
  Administered 2023-11-18: 100 mL via INTRAVENOUS

## 2023-11-18 MED ORDER — METOCLOPRAMIDE HCL 5 MG/ML IJ SOLN
5.0000 mg | Freq: Once | INTRAMUSCULAR | Status: AC
  Administered 2023-11-18: 5 mg via INTRAVENOUS
  Filled 2023-11-18: qty 2

## 2023-11-18 MED ORDER — ONDANSETRON HCL 4 MG/2ML IJ SOLN
4.0000 mg | Freq: Once | INTRAMUSCULAR | Status: AC
  Administered 2023-11-18: 4 mg via INTRAVENOUS
  Filled 2023-11-18: qty 2

## 2023-11-18 MED ORDER — DIPHENHYDRAMINE HCL 50 MG/ML IJ SOLN
12.5000 mg | Freq: Once | INTRAMUSCULAR | Status: AC
  Administered 2023-11-18: 12.5 mg via INTRAVENOUS
  Filled 2023-11-18: qty 1

## 2023-11-18 MED ORDER — IOHEXOL 300 MG/ML  SOLN: 100.0000 mL | Freq: Once | INTRAMUSCULAR | Status: DC | PRN

## 2023-11-18 MED ORDER — FAMOTIDINE IN NACL 20-0.9 MG/50ML-% IV SOLN
20.0000 mg | Freq: Once | INTRAVENOUS | Status: AC
  Administered 2023-11-18: 20 mg via INTRAVENOUS
  Filled 2023-11-18: qty 50

## 2023-11-18 MED ORDER — PROMETHAZINE HCL 25 MG RE SUPP: 25.0000 mg | Freq: Four times a day (QID) | RECTAL | 0 refills | Status: DC | PRN

## 2023-11-18 NOTE — ED Triage Notes (Signed)
 Patient to ED via POV for intermittent vomiting. Ongoing all summer but wife reports worse this week. Wife reports that they had ordered an outpatient CT scan but patient was unable to get the scan due to not being able to lay flat. Reports recent weight loss.

## 2023-11-18 NOTE — ED Provider Notes (Signed)
 [facility] Provider Note    Event Date/Time   First MD Initiated Contact with Patient 11/18/23 1614     (approximate)   History   Emesis   HPI  Gregary Blackard. is a 83 y.o. male  A-fib factor V Leiden, PE, GERD, hyperlipidemia who presents from outside imaging for an intermittent vomiting.  Patient was recently treated for urinary tract infection and has finished the course of antibiotics.  He continues to have ongoing vomiting and generalized weakness and an outside CT was ordered by his primary care physician.  Unfortunately at the CT scan will he became nauseous and had several episodes of emesis he was consequently too brought to our facility.  Patient denies any chest pain or shortness of breath.  He reports some mild suprapubic abdominal discomfort but no pain.  He denies any abnormalities with his penis or scrotum.  He has Zofran  at home and he last took a dose this morning      Physical Exam   Triage Vital Signs: ED Triage Vitals  Encounter Vitals Group     BP 11/18/23 1445 114/72     Girls Systolic BP Percentile --      Girls Diastolic BP Percentile --      Boys Systolic BP Percentile --      Boys Diastolic BP Percentile --      Pulse Rate 11/18/23 1445 90     Resp 11/18/23 1445 17     Temp 11/18/23 1445 98.2 F (36.8 C)     Temp Source 11/18/23 1445 Oral     SpO2 11/18/23 1445 95 %     Weight 11/18/23 1446 209 lb (94.8 kg)     Height 11/18/23 1446 6' 4 (1.93 m)     Head Circumference --      Peak Flow --      Pain Score 11/18/23 1446 0     Pain Loc --      Pain Education --      Exclude from Growth Chart --     Most recent vital signs: Vitals:   11/18/23 1700 11/18/23 1940  BP: 135/76 129/71  Pulse: 95 90  Resp: 19 20  Temp:  98.2 F (36.8 C)  SpO2: 90% 98%    Nursing Triage Note reviewed. Vital signs reviewed and patients oxygen saturation is normoxic  General: Patient is well nourished, well developed, awake and alert,  resting comfortably in no acute distress Head: Normocephalic and atraumatic Eyes: Normal inspection, extraocular muscles intact, no conjunctival pallor Ear, nose, throat: Normal external exam Neck: Normal range of motion Respiratory: Patient is in no respiratory distress, lungs CTAB Cardiovascular: Patient is not tachycardic, RRR without murmur appreciated GI: Abd SNT with no guarding or rebound  Back: Normal inspection of the back with good strength and range of motion throughout all ext Extremities: pulses intact with good cap refills, no LE pitting edema or calf tenderness Neuro: The patient is alert and oriented to person, place, and time, appropriately conversive, with 5/5 bilat UE/LE strength, no gross motor or sensory defects noted. Coordination appears to be adequate. Skin: Warm, dry, and intact Psych: normal mood and affect, no SI or HI  ED Results / Procedures / Treatments   Labs (all labs ordered are listed, but only abnormal results are displayed) Labs Reviewed  COMPREHENSIVE METABOLIC PANEL WITH GFR - Abnormal; Notable for the following components:      Result Value   CO2 21 (*)  Glucose, Bld 121 (*)    All other components within normal limits  CBC - Abnormal; Notable for the following components:   WBC 11.8 (*)    All other components within normal limits  URINALYSIS, ROUTINE W REFLEX MICROSCOPIC - Abnormal; Notable for the following components:   Color, Urine AMBER (*)    APPearance HAZY (*)    Ketones, ur 5 (*)    Protein, ur 30 (*)    All other components within normal limits  LACTIC ACID, PLASMA - Abnormal; Notable for the following components:   Lactic Acid, Venous 2.2 (*)    All other components within normal limits  LIPASE, BLOOD  LACTIC ACID, PLASMA  MAGNESIUM     EKG EKG and rhythm strip are interpreted by myself:   EKG: [Normal sinus rhythm] at heart rate of 78, wide QRS duration, QTc 467, nonspecific ST segments and T waves no ectopy EKG not  consistent with Acute STEMI Rhythm strip: NSR in lead II EKG not significantly changed from 08/03/2022  RADIOLOGY CT abdomen pelvis with IV contrast: No acute abnormality on my independent review and interpretation radiologist agrees    PROCEDURES:  Critical Care performed: No  Procedures   MEDICATIONS ORDERED IN ED: Medications  famotidine  (PEPCID ) IVPB 20 mg premix (0 mg Intravenous Stopped 11/18/23 1724)  metoCLOPramide  (REGLAN ) injection 5 mg (5 mg Intravenous Given 11/18/23 1636)  diphenhydrAMINE  (BENADRYL ) injection 12.5 mg (12.5 mg Intravenous Given 11/18/23 1635)  sodium chloride  0.9 % bolus 500 mL (0 mLs Intravenous Stopped 11/18/23 1829)  ondansetron  (ZOFRAN ) injection 4 mg (4 mg Intravenous Given 11/18/23 1635)  iohexol  (OMNIPAQUE ) 300 MG/ML solution 100 mL (100 mLs Intravenous Contrast Given 11/18/23 1731)     IMPRESSION / MDM / ASSESSMENT AND PLAN / ED COURSE                                Differential diagnosis includes, but is not limited to, UTI, pyelonephritis, colitis, diverticulitis, electrolyte derangement anemia   ED course: Patient well-appearing and abdominal exam demonstrates no evidence of peritonitis.  Urinalysis does seem sterilized.  He has no acute renal insufficiency.  He had no significant leukocytosis.  Symptoms resolved with IV fluids, Pepcid , Benadryl  and Reglan  along with IV Zofran .  CT abdomen demonstrated no acute abnormality.  Patient was able to ambulate and take p.o. and felt comfortable returning home and following up with his primary care physician.  I did review the incidental findings and printed out the CT report for the patient and his wife.  All questions answered and patient voiced understanding and requested discharge.  Given his request I did send him home with a short course of Phenergan  suppositories for a second line for nausea   Clinical Course as of 11/19/23 0035  Thu Nov 18, 2023  1619 Creatinine: 0.96 Creatinine okay [HD]   1753 Urinalysis, Routine w reflex microscopic -Urine, Clean Catch(!) Not consistent with UTI [HD]  1839 Patient feeling much improved.  I counseled him on the results.  I printed out the CT scan and reviewed all incidental findings with the patient and his wife who voiced understanding.  We are awaiting the repeat lactate but they feel comfortable returning home and following up with primary care physician [HD]  1926 Lactic Acid, Venous: 1.5 Much improved [HD]    Clinical Course User Index [HD] Nicholaus Rolland BRAVO, MD   At time of discharge there is no evidence  of acute life, limb, vision, or fertility threat. Patient has stable vital signs, pain is well controlled, patient is ambulatory and p.o. tolerant.  Discharge instructions were completed using the [employer] system. I would refer you to those at this time. All warnings prescriptions follow-up etc. were discussed in detail with the patient. Patient indicates understanding and is agreeable with this plan. All questions answered.  Patient is made aware that they may return to the emergency department for any worsening or new condition or for any other emergency. -- Risk: 5 This patient has a high risk of morbidity due to further diagnostic testing or treatment. Rationale: This patient's evaluation and management involve a high risk of morbidity due to the potential severity of presenting symptoms, need for diagnostic testing, and/or initiation of treatment that may require close monitoring. The differential includes conditions with potential for significant deterioration or requiring escalation of care. Treatment decisions in the ED, including medication administration, procedural interventions, or disposition planning, reflect this level of risk. Additional Support: -- Drug therapy requiring intensive monitoring for toxicity [ ]  -- Decision regarding elective major surgery with idenitified patient or procedure risk factors [ ]  -- Decision  regarding hospitalization or escalation of hospital-level care [ ]  -- Decision not to resuscitate or to de-escalate care because of poor prognosis [ ]  -- Parental controlled substances [ ]   COPA: 5 The patient has a severe exacerbation, progression, or side effect of treatment of the following illness/illnesses: []  OR  The patient has the following acute or chronic illness/injury that poses a possible threat to life or bodily function: [X] : The patient has a potentially serious acute condition or an acute exacerbation of a chronic illness requiring urgent evaluation and management in the Emergency Department. The clinical presentation necessitates immediate consideration of life-threatening or function-threatening diagnoses, even if they are ultimately ruled out.  Data(2/3 categories following were performed): 5 I reviewed or ordered at least three unique tests, external notes, and/or the history required an independent historian as one of the three requirements as following: CBC, CMP, UA AND  I independently interpreted the following test: CT abdomen pelvis with IV contrast OR  I discussed the management of the patient with the following external physician or qualified healthcare provider: []    Suggested E/M Coding Level: 5, 99285, This has been selected based on the 11/22/2021 CPT guidelines for E/M codes in the Emergency Department based on 2/3 of the CoPA, Data, and Risk.   FINAL CLINICAL IMPRESSION(S) / ED DIAGNOSES   Final diagnoses:  Nausea and vomiting, unspecified vomiting type     Rx / DC Orders   ED Discharge Orders          Ordered    promethazine  (PHENERGAN ) 25 MG suppository  Every 6 hours PRN        11/18/23 1927             Note:  This document was prepared using Dragon voice recognition software and may include unintentional dictation errors.   Nicholaus Rolland BRAVO, MD 11/19/23 [phone]

## 2023-11-18 NOTE — ED Notes (Signed)
 Pt to CT

## 2023-11-18 NOTE — Progress Notes (Signed)
 No chief complaint on file.   HPI  Mark [name]. is a 83 y.o. here for an acute issue.  He has a PMH of A-fib factor V Leiden, PE, GERD, hyperlipidemia who presents for follow-up.  Seen 1-2 weeks ago with nausea vomiting treated for mild UTI with antibiotics.  Returns today for follow-up.  There is really not gotten much better.  Still having periods of nausea with vomiting.  Projectile vomiting yesterday.  Bowels have not moved in 3 days.  X-rays last visit did show some mild stool burden.  Has generalized abdominal pain.  No fevers but has felt chilled at times.    ROS  Pertinent items are noted in HPI.  Outpatient Encounter Medications as of 11/18/2023  Medication Sig Dispense Refill  . acetaminophen  (TYLENOL ) 500 MG tablet Take 1,000 mg by mouth once daily    . apixaban  (ELIQUIS ) 2.5 mg tablet Take 1 tablet (2.5 mg total) by mouth every 12 (twelve) hours 180 tablet 3  . betamethasone  dipropionate (DIPROSONE ) 0.05 % cream Apply topically 2 (two) times daily    . buPROPion  (WELLBUTRIN  XL) 150 MG XL tablet TAKE 1 TABLET BY MOUTH ONCE DAILY 90 tablet 1  . calcium carbonate-vitamin D3 (OS-CAL 500+D) 500 mg-5 mcg (200 unit) tablet Take 1 tablet by mouth 2 (two) times daily with meals    . coenzyme Q10 10 mg capsule Take 10 mg by mouth once daily    . cyanocobalamin  (VITAMIN B12) 1000 MCG tablet Take 5,000 mcg by mouth once daily    . docusate (COLACE) 100 MG capsule Take 300 mg by mouth at bedtime    . DULCOLAX, BISACODYL , ORAL Take 100 mg by mouth once daily    . DULCOLAX, BISACODYL , ORAL Take by mouth 3 tabs daily    . esomeprazole (NEXIUM) 40 MG DR capsule Take 1 capsule (40 mg total) by mouth once daily 90 capsule 1  . finasteride  (PROSCAR ) 5 mg tablet TAKE ONE TABLET BY MOUTH EVERY DAY 90 tablet 1  . FLUoxetine  (PROZAC ) 40 MG capsule Take 1 capsule (40 mg total) by mouth once daily 90 capsule 3  . FLUoxetine  (PROZAC ) 40 MG capsule Take 80 mg by mouth once daily    . folic acid   (FOLVITE ) 1 MG tablet TAKE 1 TABLET BY MOUTH DAILY 90 tablet 1  . gabapentin (NEURONTIN) 300 MG capsule TAKE 1 CAPSULE BY MOUTH TWICE DAILY 60 capsule 0  . lidocaine  (LIDODERM ) 5 % patch PLACE 1 PATCH ONTO THE SKIN DAILY FOR A MONTH. APPLY PATCH TO THE MOST PAINFULAREA FOR UP TO 12 HOURS IN A 24 HOUR PERIOD. 30 patch 3  . meclizine  (ANTIVERT ) 25 mg tablet Take 25 mg by mouth 3 (three) times daily as needed    . NIACINAMIDE ORAL Take 1 tablet by mouth once daily    . nitrofurantoin , macrocrystal-monohydrate, (MACROBID ) 100 MG capsule Take 1 capsule (100 mg total) by mouth once daily for 180 days 90 capsule 1  . ondansetron  (ZOFRAN ) 4 MG tablet Take 1 tablet (4 mg total) by mouth every 8 (eight) hours as needed 20 tablet 3  . ondansetron  (ZOFRAN ) 4 MG tablet Take 1 tablet (4 mg total) by mouth 3 (three) times daily as needed for Nausea for up to 7 days 20 tablet 0  . simvastatin  (ZOCOR ) 40 MG tablet TAKE ONE TABLET EACH EVENING 90 tablet 1  . tamsulosin  (FLOMAX ) 0.4 mg capsule TAKE 2 CAPSULES BY MOUTH ONCE DAILY EVERY DAY 30 MINUTES AFTER SAME  MEAL EACH DAY. 180 capsule 1   No facility-administered encounter medications on file as of 11/18/2023.    Allergies as of 11/18/2023 - Reviewed 10/25/2023  Allergen Reaction Noted  . Donepezil Hallucination and Other (See Comments) 03/05/2022  . Erythromycin Unknown   . Keflex [cephalexin] Swelling 11/14/2013  . Penicillins Unknown and Other (See Comments) 10/08/2023  . Ciprofloxacin Muscle Pain and Other (See Comments) 04/12/2018  . Penicillamine Other (See Comments) 01/19/2023    Past Medical History:  Diagnosis Date  . Anxiety 09/11/2011  . Atrial fibrillation (CMS/HHS-HCC)   . Back pain, chronic 09/11/2011   Chronic low back pain.  . Colon polyp   . Cyst of left kidney 09/11/2011   Kidney cyst  . Factor V Leiden mutation (CMS/HHS-HCC)   . Fracture of L2 vertebra (CMS/HHS-HCC)   . GERD (gastroesophageal reflux disease) 09/11/2011  . H/O  adenomatous polyp of colon 07/20/2014  . Heart murmur 09/11/2011  . Hyperlipidemia 09/11/2011  . Mitral valve prolapse   . Multiple thyroid  nodules    He reports several negative scans.    . Nausea 02/21/2018  . 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      A.  Symptomatic for greater than three to four years.      B.  Followed by Dr. Marty Che in [facility].      C.  Additional studies include negative ANA, negative Lyme titer,          negative SPEP, and negative RPR/syphilis testing.      D.  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   . Osteoarthritis   . Pulmonary embolus (CMS/HHS-HCC) 09/11/2011   Idiopathic pulmonary embolus.       A.  Presentation September 2006 with pleuritic chest pain,          shortness of breath, and lightheadedness.       B.  Patient symptomatic for one week and subsequently          diagnosed by chest x-ray abnormalities and CT scan confirmation.      C.  No risk factors identified for pulmonary embolus.      D.  Diagnostic studies:           1. heterozygous for FV  . Skin cancer   . 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  . Vertigo 09/11/2011   Vertigo      A. acute onset with nausea/vomiting July 2011     B. evaluated locally in ED: negative CT scan for CVA     C. scheduled for ENT evaluation    Past Surgical History:  Procedure Laterality Date  . COLONOSCOPY  07/24/2003   Colonic Mucosa w/Cautery Artifact  . COLONOSCOPY  11/14/2008   Adenomatous Polyp  . COLONOSCOPY  08/15/2014   Adenomatous Polyps: [facility] 07/2019  . EGD  08/15/2014   No repeat per RTE  . skin cancer removed Left 05/12/2017   removed from underneath left eye  . APPENDECTOMY    . BACK SURGERY    . KYPHOPLASTY    . LAMINECTOMY LUMBAR SPINE     Partial L4, partial L5 laminectomy with bilateral foraminotomies (CPT code 36952).  . POLYPECTOMY    . Skin cancer removed     Removed from over the left clavicle.  . TONSILLECTOMY     T & A     There were no vitals filed for this visit.  Physical Exam  General. Well appearing; NAD; VS reviewed  HEENT: Sclera and conjunctiva clear; EOMI,  Lungs. Respirations unlabored; clear to auscultation bilaterally. Cardiovascular. Heart regular rate and rhythm without murmurs, gallops, or rubs. Abdomen:  Soft, generalized tenderness without rebound or guarding.  Decreased bowel sounds. Extremities:  No edema. Skin. Normal color and turgor Neurologic. Alert and oriented x3   Assessment and Plan  83 year old with above PMH who presents with persistent, nausea, vomiting and generalized abdominal pain for the last few weeks.  Recently took doxycycline for UTI.  Further evaluate with CT and repeating labs today. 1. Generalized abdominal pain  -     CT abdomen pelvis with contrast -     CBC w/auto Differential (5 Part) -     Comprehensive Metabolic Panel (CMP) -     Lipase -     Urinalysis w/Microscopic  2. Nausea and vomiting, unspecified vomiting type  -     CT abdomen pelvis with contrast -     CBC w/auto Differential (5 Part) -     Comprehensive Metabolic Panel (CMP) -     Lipase -     Urinalysis w/Microscopic    I have personally performed this service.  [address] [place], [place]

## 2023-11-18 NOTE — Discharge Instructions (Signed)
 You were seen in the emergency department for nausea and vomiting.  Workup today was reassuring and you are safe to continue the workup as an outpatient.  For the next 24 hours please stick to bland foods.  Fluids are more important than solids.  I would use your Zofran  as first-line but if you run into trouble with intractable vomiting, I have ordered you some promethazine  suppositories but be mindful that these can make you sleepy.  Please follow-up with follow-up with your primary care physician.  Return with any acutely worsening symptoms or any other emergency.  It was a pleasure taking care of you and I wish you the best of luck -- RETURN PRECAUTIONS & AFTERCARE: (ENGLISH) RETURN PRECAUTIONS: Return immediately to the emergency department or see/call your doctor if you feel worse, weak or have changes in speech or vision, are short of breath, have fever, vomiting, pain, bleeding or dark stool, trouble urinating or any new issues. Return here or see/call your doctor if not improving as expected for your suspected condition. FOLLOW-UP CARE: Call your doctor and/or any doctors we referred you to for more advice and to make an appointment. Do this today, tomorrow or after the weekend. Some doctors only take PPO insurance so if you have HMO insurance you may want to contact your HMO or your regular doctor for referral to a specialist within your plan. Either way tell the doctor's office that it was a referral from the emergency department so you get the soonest possible appointment.  YOUR TEST RESULTS: Take result reports of any blood or urine tests, imaging tests and EKG's to your doctor and any referral doctor. Have any abnormal tests repeated. Your doctor or a referral doctor can let you know when this should be done. Also make sure your doctor contacts this hospital to get any test results that are not currently available such as cultures or special tests for infection and final imaging reports, which are  often not available at the time you leave the ER but which may list additional important findings that are not documented on the preliminary report. BLOOD PRESSURE: If your blood pressure was greater than 120/80 have your blood pressure rechecked within 1 to 2 weeks. MEDICATION SIDE EFFECTS: Do not drive, walk, bike, take the bus, etc. if you have received or are being prescribed any sedating medications such as those for pain or anxiety or certain antihistamines like Benadryl . If you have been give one of these here get a taxi home or have a friend drive you home. Ask your pharmacist to counsel you on potential side effects of any new medication

## 2023-12-07 ENCOUNTER — Other Ambulatory Visit: Payer: Self-pay | Admitting: Psychiatry

## 2023-12-07 DIAGNOSIS — F330 Major depressive disorder, recurrent, mild: Secondary | ICD-10-CM

## 2023-12-21 ENCOUNTER — Inpatient Hospital Stay
Admission: EM | Admit: 2023-12-21 | Discharge: 2023-12-31 | DRG: 392 | Disposition: A | Discharge status: Skilled Nursing Facility | Attending: Student in an Organized Health Care Education/Training Program | Admitting: Student in an Organized Health Care Education/Training Program

## 2023-12-21 ENCOUNTER — Inpatient Hospital Stay

## 2023-12-21 ENCOUNTER — Other Ambulatory Visit: Payer: Self-pay

## 2023-12-21 DIAGNOSIS — H8120 Vestibular neuronitis, unspecified ear: Secondary | ICD-10-CM | POA: Diagnosis present

## 2023-12-21 DIAGNOSIS — K5900 Constipation, unspecified: Secondary | ICD-10-CM | POA: Diagnosis present

## 2023-12-21 DIAGNOSIS — K317 Polyp of stomach and duodenum: Secondary | ICD-10-CM | POA: Diagnosis present

## 2023-12-21 DIAGNOSIS — G309 Alzheimer's disease, unspecified: Secondary | ICD-10-CM | POA: Diagnosis present

## 2023-12-21 DIAGNOSIS — I480 Paroxysmal atrial fibrillation: Secondary | ICD-10-CM | POA: Diagnosis present

## 2023-12-21 DIAGNOSIS — K449 Diaphragmatic hernia without obstruction or gangrene: Secondary | ICD-10-CM | POA: Diagnosis present

## 2023-12-21 DIAGNOSIS — H9193 Unspecified hearing loss, bilateral: Secondary | ICD-10-CM | POA: Diagnosis present

## 2023-12-21 DIAGNOSIS — Z87440 Personal history of urinary (tract) infections: Secondary | ICD-10-CM

## 2023-12-21 DIAGNOSIS — H8110 Benign paroxysmal vertigo, unspecified ear: Secondary | ICD-10-CM | POA: Diagnosis present

## 2023-12-21 DIAGNOSIS — E876 Hypokalemia: Secondary | ICD-10-CM | POA: Diagnosis present

## 2023-12-21 DIAGNOSIS — Z87891 Personal history of nicotine dependence: Secondary | ICD-10-CM

## 2023-12-21 DIAGNOSIS — E440 Moderate protein-calorie malnutrition: Secondary | ICD-10-CM | POA: Diagnosis present

## 2023-12-21 DIAGNOSIS — Z23 Encounter for immunization: Secondary | ICD-10-CM | POA: Diagnosis not present

## 2023-12-21 DIAGNOSIS — F419 Anxiety disorder, unspecified: Secondary | ICD-10-CM | POA: Diagnosis present

## 2023-12-21 DIAGNOSIS — Z880 Allergy status to penicillin: Secondary | ICD-10-CM

## 2023-12-21 DIAGNOSIS — G3184 Mild cognitive impairment, so stated: Secondary | ICD-10-CM | POA: Diagnosis present

## 2023-12-21 DIAGNOSIS — M549 Dorsalgia, unspecified: Secondary | ICD-10-CM | POA: Diagnosis present

## 2023-12-21 DIAGNOSIS — D6851 Activated protein C resistance: Secondary | ICD-10-CM | POA: Diagnosis present

## 2023-12-21 DIAGNOSIS — Z6825 Body mass index (BMI) 25.0-25.9, adult: Secondary | ICD-10-CM

## 2023-12-21 DIAGNOSIS — R710 Precipitous drop in hematocrit: Secondary | ICD-10-CM | POA: Diagnosis not present

## 2023-12-21 DIAGNOSIS — E785 Hyperlipidemia, unspecified: Secondary | ICD-10-CM | POA: Diagnosis present

## 2023-12-21 DIAGNOSIS — G8929 Other chronic pain: Secondary | ICD-10-CM | POA: Diagnosis present

## 2023-12-21 DIAGNOSIS — F330 Major depressive disorder, recurrent, mild: Secondary | ICD-10-CM | POA: Diagnosis present

## 2023-12-21 DIAGNOSIS — Z881 Allergy status to other antibiotic agents status: Secondary | ICD-10-CM

## 2023-12-21 DIAGNOSIS — R112 Nausea with vomiting, unspecified: Principal | ICD-10-CM | POA: Diagnosis present

## 2023-12-21 DIAGNOSIS — K219 Gastro-esophageal reflux disease without esophagitis: Secondary | ICD-10-CM | POA: Diagnosis present

## 2023-12-21 DIAGNOSIS — Z792 Long term (current) use of antibiotics: Secondary | ICD-10-CM

## 2023-12-21 DIAGNOSIS — Z79899 Other long term (current) drug therapy: Secondary | ICD-10-CM | POA: Diagnosis not present

## 2023-12-21 DIAGNOSIS — R110 Nausea: Principal | ICD-10-CM

## 2023-12-21 DIAGNOSIS — Z85828 Personal history of other malignant neoplasm of skin: Secondary | ICD-10-CM

## 2023-12-21 DIAGNOSIS — Z7901 Long term (current) use of anticoagulants: Secondary | ICD-10-CM | POA: Diagnosis not present

## 2023-12-21 DIAGNOSIS — T378X5A Adverse effect of other specified systemic anti-infectives and antiparasitics, initial encounter: Secondary | ICD-10-CM | POA: Diagnosis present

## 2023-12-21 DIAGNOSIS — Z7984 Long term (current) use of oral hypoglycemic drugs: Secondary | ICD-10-CM

## 2023-12-21 DIAGNOSIS — Z86711 Personal history of pulmonary embolism: Secondary | ICD-10-CM

## 2023-12-21 DIAGNOSIS — Z888 Allergy status to other drugs, medicaments and biological substances status: Secondary | ICD-10-CM

## 2023-12-21 LAB — COMPREHENSIVE METABOLIC PANEL WITH GFR
ALT: 21 U/L (ref 0–44)
AST: 23 U/L (ref 15–41)
Albumin: 3.8 g/dL (ref 3.5–5.0)
Alkaline Phosphatase: 68 U/L (ref 38–126)
Anion gap: 11 (ref 5–15)
BUN: 19 mg/dL (ref 8–23)
CO2: 24 mmol/L (ref 22–32)
Calcium: 9.4 mg/dL (ref 8.9–10.3)
Chloride: 103 mmol/L (ref 98–111)
Creatinine, Ser: 0.9 mg/dL (ref 0.61–1.24)
GFR, Estimated: >60 mL/min (ref >60)
Glucose, Bld: 114 mg/dL — ABNORMAL HIGH (ref 70–99)
Potassium: 3.9 mmol/L (ref 3.5–5.1)
Sodium: 138 mmol/L (ref 135–145)
Total Bilirubin: 1.1 mg/dL (ref 0.0–1.2)
Total Protein: 7.4 g/dL (ref 6.5–8.1)

## 2023-12-21 LAB — CBC
HCT: 49.1 % (ref 39.0–52.0)
Hemoglobin: 16.1 g/dL (ref 13.0–17.0)
MCH: 30.8 pg (ref 26.0–34.0)
MCHC: 32.8 g/dL (ref 30.0–36.0)
MCV: 94.1 fL (ref 80.0–100.0)
Platelets: 259 K/uL (ref 150–400)
RBC: 5.22 MIL/uL (ref 4.22–5.81)
RDW: 12.6 % (ref 11.5–15.5)
WBC: 7.4 K/uL (ref 4.0–10.5)
nRBC: 0 % (ref 0.0–0.2)

## 2023-12-21 LAB — PROTIME-INR
INR: 1.1 (ref 0.8–1.2)
Prothrombin Time: 14.9 s (ref 11.4–15.2)

## 2023-12-21 LAB — LIPASE, BLOOD: Lipase: 30 U/L (ref 11–51)

## 2023-12-21 LAB — APTT: aPTT: 28 s (ref 24–36)

## 2023-12-21 LAB — HEPARIN LEVEL (UNFRACTIONATED): Heparin Unfractionated: 0.78 [IU]/mL — ABNORMAL HIGH (ref 0.30–0.70)

## 2023-12-21 MED ORDER — SODIUM CHLORIDE 0.9 % IV BOLUS
1000.0000 mL | Freq: Once | INTRAVENOUS | Status: AC
  Administered 2023-12-21: 1000 mL via INTRAVENOUS

## 2023-12-21 MED ORDER — ONDANSETRON HCL 4 MG/2ML IJ SOLN: 4.0000 mg | Freq: Four times a day (QID) | INTRAMUSCULAR | Status: DC | PRN

## 2023-12-21 MED ORDER — ONDANSETRON HCL 4 MG PO TABS: 4.0000 mg | ORAL_TABLET | Freq: Four times a day (QID) | ORAL | Status: DC | PRN

## 2023-12-21 MED ORDER — METOPROLOL TARTRATE 5 MG/5ML IV SOLN: 5.0000 mg | INTRAVENOUS | Status: DC | PRN

## 2023-12-21 MED ORDER — LACTATED RINGERS IV SOLN
INTRAVENOUS | Status: DC
Start: 2023-12-21 — End: 2023-12-22

## 2023-12-21 MED ORDER — ACETAMINOPHEN 650 MG RE SUPP: 650.0000 mg | Freq: Four times a day (QID) | RECTAL | Status: AC | PRN

## 2023-12-21 MED ORDER — HEPARIN (PORCINE) 25000 UT/250ML-% IV SOLN
1100.0000 [IU]/h | INTRAVENOUS | Status: AC
  Administered 2023-12-21: 1400 [IU]/h via INTRAVENOUS
  Administered 2023-12-22: 1300 [IU]/h via INTRAVENOUS
  Filled 2023-12-21 (×2): qty 250

## 2023-12-21 MED ORDER — HEPARIN BOLUS VIA INFUSION
2300.0000 [IU] | Freq: Once | INTRAVENOUS | Status: AC
  Administered 2023-12-21: 2300 [IU] via INTRAVENOUS
  Filled 2023-12-21: qty 2300

## 2023-12-21 MED ORDER — ACETAMINOPHEN 325 MG PO TABS: 650.0000 mg | ORAL_TABLET | Freq: Four times a day (QID) | ORAL | Status: AC | PRN

## 2023-12-21 MED ORDER — SENNOSIDES-DOCUSATE SODIUM 8.6-50 MG PO TABS
1.0000 | ORAL_TABLET | Freq: Every evening | ORAL | Status: DC | PRN
  Administered 2023-12-26: 1 via ORAL
  Filled 2023-12-21: qty 1

## 2023-12-21 MED ORDER — PANTOPRAZOLE SODIUM 40 MG IV SOLR
40.0000 mg | Freq: Two times a day (BID) | INTRAVENOUS | Status: DC
Start: 2023-12-21 — End: 2023-12-24
  Administered 2023-12-21 – 2023-12-22 (×2): 40 mg via INTRAVENOUS
  Filled 2023-12-21 (×2): qty 10

## 2023-12-21 MED ORDER — SODIUM CHLORIDE 0.9 % IV SOLN: 12.5000 mg | Freq: Four times a day (QID) | INTRAVENOUS | Status: DC | PRN

## 2023-12-21 MED ORDER — MIDAZOLAM HCL 2 MG/2ML IJ SOLN: 0.5000 mg | Freq: Four times a day (QID) | INTRAMUSCULAR | Status: DC | PRN

## 2023-12-21 NOTE — Assessment & Plan Note (Signed)
 Etiology unclear at this time Portable chest x-ray ordered EDP has consulted GI, who states the patient will be seen Symptomatic support: ondansetron  4 mg IV every 6 hours as needed for nausea and vomiting, 5 days ordered; Phenergan  12.5 mg IV every 6 hours as needed for refractory nausea and vomiting, 1 day ordered Continue with LR infusion at 125 mL/h, 1 day ordered Clear liquid diet ordered on admission with orders to advance as tolerated to full liquid diet Admit to telemetry medical, inpatient

## 2023-12-21 NOTE — Hospital Course (Signed)
 [name], [name] is an 83 year old male with history of GERD, paroxysmal atrial fibrillation, on chronic anticoagulation with Eliquis , hyperlipidemia, factor V Leyden, who presents ED from PCP for chief concerns of intractable nausea and vomiting.  Serum sodium is 138, potassium 3.8, chloride 103, bicarb 24, BUN of 19, serum creatinine 0.90, eGFR greater than 60, nonfasting blood glucose 114, WBC 7.4, hemoglobin 16.1, platelets of 259.  Lactic acid is 1.5.  UA was negative for leukocytes and negative for nitrates.  ED treatment: Sodium chloride  1 L bolus.

## 2023-12-21 NOTE — ED Provider Notes (Signed)
 [facility] Provider Note    Event Date/Time   First MD Initiated Contact with Patient 12/21/23 1702     (approximate)   History   Emesis   HPI  Mark [name]. is a 83 y.o. male with his atrial fibrillation, factor V Leiden, PE, GERD, hyperlipidemia who presents with persistent nausea and vomiting for the last 5 or 6 weeks, worsening over approximate last week, associated with decreased p.o. intake.  The wife states that in the last 3 to 4 days the patient has only had half of a container of Ensure and 1 to 2 pieces of toast.  He feels weak.  He states the nausea is worse when he stands up.  He denies any associated abdominal pain or diarrhea.  He states that his primary care doctor sent him in for fluids and possible EGD.  I reviewed the past medical records.  The patient was seen in the ED on 8/28 with intermittent vomiting.  CT at that time showed no acute abnormality and his workup was otherwise negative.  He was subsequently seen by Dr. Sadie from internal medicine on 9/5 for follow-up after his ED visit and symptoms were better at that time.   Physical Exam   Triage Vital Signs: ED Triage Vitals  Encounter Vitals Group     BP 12/21/23 1444 117/76     Girls Systolic BP Percentile --      Girls Diastolic BP Percentile --      Boys Systolic BP Percentile --      Boys Diastolic BP Percentile --      Pulse Rate 12/21/23 1444 87     Resp 12/21/23 1444 18     Temp 12/21/23 1444 98.3 F (36.8 C)     Temp src --      SpO2 12/21/23 1444 100 %     Weight 12/21/23 1445 208 lb 15.9 oz (94.8 kg)     Height 12/21/23 1445 6' 4 (1.93 m)     Head Circumference --      Peak Flow --      Pain Score 12/21/23 1445 0     Pain Loc --      Pain Education --      Exclude from Growth Chart --     Most recent vital signs: Vitals:   12/21/23 2104 12/22/23 0053  BP: 122/68 119/76  Pulse: 82 88  Resp: 15 18  Temp: 97.7 F (36.5 C) 98.6 F (37 C)  SpO2: 98%  96%     General: Alert, somewhat weak appearing but in no distress.  CV:  Good peripheral perfusion.  Resp:  Normal effort.  Abd:  Soft with no focal tenderness.  No distention.  Other:  No jaundice or scleral icterus.  Dry mucous membranes.   ED Results / Procedures / Treatments   Labs (all labs ordered are listed, but only abnormal results are displayed) Labs Reviewed  COMPREHENSIVE METABOLIC PANEL WITH GFR - Abnormal; Notable for the following components:      Result Value   Glucose, Bld 114 (*)    All other components within normal limits  HEPARIN LEVEL (UNFRACTIONATED) - Abnormal; Notable for the following components:   Heparin Unfractionated 0.78 (*)    All other components within normal limits  LIPASE, BLOOD  CBC  PROTIME-INR  APTT  URINALYSIS, ROUTINE W REFLEX MICROSCOPIC  BASIC METABOLIC PANEL WITH GFR  CBC  HEPARIN LEVEL (UNFRACTIONATED)  APTT  EKG    RADIOLOGY    PROCEDURES:  Critical Care performed: No  Procedures   MEDICATIONS ORDERED IN ED: Medications  acetaminophen  (TYLENOL ) tablet 650 mg (has no administration in time range)    Or  acetaminophen  (TYLENOL ) suppository 650 mg (has no administration in time range)  ondansetron  (ZOFRAN ) injection 4 mg (has no administration in time range)  senna-docusate (Senokot-S) tablet 1 tablet (has no administration in time range)  lactated ringers  infusion ( Intravenous New Bag/Given 12/21/23 2248)  promethazine  (PHENERGAN ) 12.5 mg in sodium chloride  0.9 % 50 mL IVPB (has no administration in time range)  pantoprazole  (PROTONIX ) injection 40 mg (40 mg Intravenous Given 12/21/23 2231)  metoprolol  tartrate (LOPRESSOR ) injection 5 mg (has no administration in time range)  midazolam  (VERSED ) injection 0.5 mg (has no administration in time range)  heparin ADULT infusion 100 units/mL (25000 units/250mL) (1,400 Units/hr Intravenous New Bag/Given 12/21/23 2254)  sodium chloride  0.9 % bolus 1,000 mL (1,000 mLs  Intravenous New Bag/Given 12/21/23 1802)  heparin bolus via infusion 2,300 Units (2,300 Units Intravenous Bolus from Bag 12/21/23 2255)     IMPRESSION / MDM / ASSESSMENT AND PLAN / ED COURSE  I reviewed the triage vital signs and the nursing notes.  83 year old male with PMH as noted above presents with persistent nausea and vomiting for the last 5 or 6 weeks with minimal p.o. intake over the last several days.  Differential diagnosis includes, but is not limited to, gastritis, gastroparesis, PUD, esophagitis, esophageal stricture, pancreatitis, other hepatobiliary cause.  There is no evidence of acute cause such as bowel obstruction.  The patient had imaging for the same symptoms on 8/28, which was negative.  Patient's presentation is most consistent with acute complicated illness / injury requiring diagnostic workup.  The patient is on the cardiac monitor to evaluate for evidence of arrhythmia and/or significant heart rate changes.  We will obtain lab workup.  There is no indication for repeat imaging at this time.  I will reach out to GI.  ----------------------------------------- 7:41 PM on 12/21/2023 -----------------------------------------  I consulted and discussed the case with Dr. Unk from GI.  I have ordered fluids.  I then consulted Dr. Sherre from the hospitalist service; based our discussion she agrees to evaluate the patient for admission.   FINAL CLINICAL IMPRESSION(S) / ED DIAGNOSES   Final diagnoses:  Intractable nausea     Rx / DC Orders   ED Discharge Orders     None        Note:  This document was prepared using Dragon voice recognition software and may include unintentional dictation errors.    Jacolyn Pae, MD 12/22/23 [phone]

## 2023-12-21 NOTE — H&P (Signed)
 History and Physical   Mark [name] Mark [name]. FMW:[id number] DOB: 07/28/1940 DOA: 12/21/2023  PCP: Mark Manna, MD  Patient coming from: Outpatient PCP via EMS  I have personally briefly reviewed patient's old medical records in [facility] EMR.  Chief Concern: Nausea and vomiting  HPI: [name], Mark [name]. is an 83 year old male with history of GERD, paroxysmal atrial fibrillation, on chronic anticoagulation with Eliquis , hyperlipidemia, factor V Leyden, who presents ED from PCP for chief concerns of intractable nausea and vomiting.  Serum sodium is 138, potassium 3.8, chloride 103, bicarb 24, BUN of 19, serum creatinine 0.90, eGFR greater than 60, nonfasting blood glucose 114, WBC 7.4, hemoglobin 16.1, platelets of 259.  Lactic acid is 1.5.  UA was negative for leukocytes and negative for nitrates.  ED treatment: Sodium chloride  1 L bolus. -------------------------------------- At bedside, patient able to tell me his first and last name, location, and identify his spouse at bedside.  He was not able to tell me his age after multiple attempts or the current calendar year.  Spouse reports that he has been having ongoing issues with difficulty with nausea and vomiting anytime he gets up.  This has been happening since July.  Spouse reports that in July, they had an MRI of his brain to evaluate for any intracranial abnormalities and per patient this was negative.  He reports the nausea and vomiting is not related to any specific foods.  Patient has not been able to tolerate his pills.  Patient reports this is never happened before July.  Social history: He lives at home with his spouse of 57 years.  He denies tobacco, EtOH, recreational drug use.  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  ROS: Constitutional: no weight change, no fever ENT/Mouth: no sore throat, no rhinorrhea Eyes: no eye pain, no vision changes Cardiovascular: no chest pain, no  dyspnea,  no edema, no palpitations Respiratory: no cough, no sputum, no wheezing Gastrointestinal: + nausea, + vomiting, no diarrhea, no constipation Genitourinary: no urinary incontinence, no dysuria, no hematuria Musculoskeletal: no arthralgias, no myalgias Skin: no skin lesions, no pruritus, Neuro: + weakness, no loss of consciousness, no syncope Psych: no anxiety, no depression, + decrease appetite Heme/Lymph: no bruising, no bleeding  ED Course: Discussed with EDP, patient requiring hospitalization for chief concerns of intractable nausea and vomiting.  Assessment/Plan  Principal Problem:   Intractable nausea and vomiting Active Problems:   Anxiety   Chronic anticoagulation   GERD (gastroesophageal reflux disease)   Hyperlipidemia   PAF (paroxysmal atrial fibrillation) (HCC)   MDD (major depressive disorder), recurrent episode, mild   MCI (mild cognitive impairment)   Assessment and Plan:  * Intractable nausea and vomiting Etiology unclear at this time Portable chest x-ray ordered EDP has consulted GI, who states the patient will be seen Symptomatic support: ondansetron  4 mg IV every 6 hours as needed for nausea and vomiting, 5 days ordered; Phenergan  12.5 mg IV every 6 hours as needed for refractory nausea and vomiting, 1 day ordered Continue with LR infusion at 125 mL/h, 1 day ordered Clear liquid diet ordered on admission with orders to advance as tolerated to full liquid diet Admit to telemetry medical, inpatient  PAF (paroxysmal atrial fibrillation) (HCC) Heparin per pharmacy Metoprolol  5 mg IV every 4 hours as needed for heart rate greater than 120, 3 doses ordered  Hyperlipidemia Simvastatin  40 mg daily, not resumed on admission  GERD (gastroesophageal reflux disease) Protonix  40  mg IV twice daily  Chronic anticoagulation Eliquis  not resumed on admission Heparin per pharmacy  Anxiety Midazolam  0.5 mg IV every 6 hours as needed for agitation, anxiety, 2  doses ordered  Chart reviewed.   DVT prophylaxis: Heparin per pharmacy Code Status: Full code Diet: Clear liquid diet with orders to advance as tolerated to full liquid diet Family Communication: Updated spouse at bedside with patient's permission Disposition Plan: Pending clinical course Consults called: Gastroenterology per EDP Admission status: Telemetry medical, inpatient  Past Medical History:  Diagnosis Date   Anxiety    Atrial fibrillation (HCC)    Back pain, chronic    Cancer (HCC)    skin   Colon polyp    Cyst of left kidney    Dementia (HCC)    GERD (gastroesophageal reflux disease)    Heart murmur    Hyperlipemia    Mitral valve prolapse    Neuromuscular disorder (HCC)    Neuropathy    Osteoarthritis    Pulmonary emboli (HCC)    Spinal stenosis    Thyroid  nodule    Vertigo    Past Surgical History:  Procedure Laterality Date   APPENDECTOMY     BACK SURGERY     COLONOSCOPY N/A 08/15/2014   Procedure: COLONOSCOPY;  Surgeon: Lamar ONEIDA Holmes, MD;  Location: [facility] ENDOSCOPY;  Service: Endoscopy;  Laterality: N/A;   ESOPHAGOGASTRODUODENOSCOPY N/A 08/15/2014   Procedure: ESOPHAGOGASTRODUODENOSCOPY (EGD);  Surgeon: Lamar ONEIDA Holmes, MD;  Location: [facility] ENDOSCOPY;  Service: Endoscopy;  Laterality: N/A;   IR KYPHO LUMBAR INC FX REDUCE BONE BX UNI/BIL CANNULATION INC/IMAGING  10/22/2022   IR RADIOLOGIST EVAL & MGMT  10/15/2022   IR RADIOLOGIST EVAL & MGMT  11/03/2022   IR RADIOLOGIST EVAL & MGMT  11/05/2022   TONSILLECTOMY     Social History:  reports that he quit smoking about 65 years ago. His smoking use included cigarettes. He has never used smokeless tobacco. He reports current alcohol use of about 1.0 standard drink of alcohol per week. He reports that he does not use drugs.  Allergies  Allergen Reactions   Penicillins Swelling and Rash    Has patient had a PCN reaction causing immediate rash, facial/tongue/throat swelling, SOB or lightheadedness with hypotension:  Yes  Has patient had a PCN reaction causing severe rash involving mucus membranes or skin necrosis: No  Has patient had a PCN reaction that required hospitalization: Unknown  Has patient had a PCN reaction occurring within the last 10 years: No  If all of the above answers are NO, then may proceed with Cephalosporin use.  Product containing penicillin (product)   Donepezil Other (See Comments)    Hallucination, confusion   Ciprofloxacin Other (See Comments)   Erythromycin Swelling   Keflex [Cephalexin] Swelling   Penicillamine Other (See Comments)   Family History  Problem Relation Age of Onset   Stroke Mother    COPD Father    Family history: Family history reviewed and not pertinent.  Prior to Admission medications   Medication Sig Start Date End Date Taking? Authorizing Provider  apixaban  (ELIQUIS ) 2.5 MG TABS tablet Take 1 tablet by mouth every 12 (twelve) hours. 06/25/22   [provider]  buPROPion  (WELLBUTRIN ) 75 MG tablet Take 75 mg by mouth daily with breakfast.    [provider]  cholecalciferol (VITAMIN D3) 10 MCG (400 UNIT) TABS tablet Take 400 Units by mouth daily.    [provider]  Coenzyme Q10 (CO Q 10 PO) Take 1 Dose  by mouth daily.     [provider]  Cyanocobalamin  (B-12) 5000 MCG CAPS Take 5,000 mg by mouth daily. 02/22/18   Pyreddy, Pavan, MD  docusate sodium  (COLACE) 100 MG capsule Take 300 mg by mouth at bedtime.    [provider]  esomeprazole (NEXIUM) 40 MG capsule Take 1 capsule by mouth daily. 07/09/22   [provider]  finasteride  (PROSCAR ) 5 MG tablet TAKE 1 TABLET BY MOUTH DAILY 02/15/23   McGowan, Clotilda A, PA-C  FLUoxetine  (PROZAC ) 40 MG capsule TAKE 2 CAPSULES (80MG  TOTAL) BY MOUTH ONCE DAILY 12/09/23 12/08/24  Eappen, Saramma, MD  gabapentin (NEURONTIN) 300 MG capsule Take 300 mg by mouth 2 (two) times daily. 10/01/22   [provider]  memantine  (NAMENDA ) 5 MG tablet 5 mg 2 (two)  times daily. 07/09/22   [provider]  niacinamide 100 MG tablet Take 100 mg by mouth in the morning.    [provider]  nitrofurantoin , macrocrystal-monohydrate, (MACROBID ) 100 MG capsule Take 100 mg by mouth 2 (two) times daily.    [provider]  ondansetron  (ZOFRAN -ODT) 4 MG disintegrating tablet Take 1 tablet (4 mg total) by mouth every 8 (eight) hours as needed for nausea or vomiting. 08/01/22   Sung, Jade J, MD  promethazine  (PHENERGAN ) 25 MG suppository Place 1 suppository (25 mg total) rectally every 6 (six) hours as needed for nausea or vomiting. 11/18/23   Nicholaus Rolland BRAVO, MD  simvastatin  (ZOCOR ) 40 MG tablet Take 40 mg by mouth daily.    [provider]  tamsulosin  (FLOMAX ) 0.4 MG CAPS capsule Take 1 capsule (0.4 mg total) by mouth daily. 12/24/21   Penne Knee, MD  triamcinolone  (KENALOG) 0.1 % paste 1 Application 2 (two) times daily. 02/04/22   [provider]   Physical Exam: Vitals:   12/21/23 1930 12/21/23 2030 12/21/23 2041 12/21/23 2104  BP: 127/76 132/80  122/68  Pulse: 82 80  82  Resp:  20  15  Temp:   98 F (36.7 C) 97.7 F (36.5 C)  TempSrc:   Oral   SpO2: 96% 90%  98%  Weight:      Height:       Constitutional: appears age-appropriate, frail, NAD, calm Eyes: PERRL, lids and conjunctivae normal HENMT: Bilateral temporal wasting, mucous membranes are moist. Posterior pharynx clear of any exudate or lesions. Age-appropriate dentition. Hearing appropriate.  Tonsils are absent. Neck: normal, supple, no masses, no thyromegaly Respiratory: clear to auscultation bilaterally, no wheezing, no crackles. Normal respiratory effort. No accessory muscle use.  Cardiovascular: Regular rate and rhythm, no murmurs / rubs / gallops. No extremity edema. 2+ pedal pulses. No carotid bruits.  Abdomen: no tenderness, no masses palpated, no hepatosplenomegaly. Bowel sounds positive.  Musculoskeletal: no clubbing / cyanosis. No joint  deformity upper and lower extremities. Good ROM, no contractures, no atrophy. Normal muscle tone.  Skin: no rashes, lesions, ulcers. No induration Neurologic: Sensation intact. Strength 5/5 in all 4.  Psychiatric: Normal judgment and insight. Alert and oriented x 3. Normal mood.   EKG: independently reviewed, showing not indicated at this time  Chest x-ray on Admission: I personally reviewed and I agree with radiologist reading as below.  DG Chest Port 1 View Result Date: 12/21/2023 CLINICAL DATA:  Nausea vomiting EXAM: PORTABLE CHEST 1 VIEW COMPARISON:  08/02/2022 FINDINGS: No acute airspace disease or effusion. Stable cardiomediastinal silhouette. No pneumothorax. Mild coarse chronic interstitial opacities. Advanced right shoulder degenerative change IMPRESSION: No active disease. Mild chronic interstitial  changes. Electronically Signed   By: Luke Bun M.D.   On: 12/21/2023 20:06   Labs on Admission: I have personally reviewed following labs  CBC: Recent Labs  Lab 12/21/23 1448  WBC 7.4  HGB 16.1  HCT 49.1  MCV 94.1  PLT 259   Basic Metabolic Panel: Recent Labs  Lab 12/21/23 1448  NA 138  K 3.9  CL 103  CO2 24  GLUCOSE 114*  BUN 19  CREATININE 0.90  CALCIUM 9.4   GFR: Estimated Creatinine Clearance: 75 mL/min (by C-G formula based on SCr of 0.9 mg/dL).  Liver Function Tests: Recent Labs  Lab 12/21/23 1448  AST 23  ALT 21  ALKPHOS 68  BILITOT 1.1  PROT 7.4  ALBUMIN 3.8   Coagulation Profile: Recent Labs  Lab 12/21/23 1448  INR 1.1   Urine analysis:    Component Value Date/Time   COLORURINE AMBER (A) 11/18/2023 1700   APPEARANCEUR HAZY (A) 11/18/2023 1700   APPEARANCEUR Clear 10/06/2022 1548   LABSPEC 1.016 11/18/2023 1700   PHURINE 5.0 11/18/2023 1700   GLUCOSEU NEGATIVE 11/18/2023 1700   HGBUR NEGATIVE 11/18/2023 1700   BILIRUBINUR NEGATIVE 11/18/2023 1700   BILIRUBINUR Negative 10/06/2022 1548   KETONESUR 5 (A) 11/18/2023 1700   PROTEINUR  30 (A) 11/18/2023 1700   NITRITE NEGATIVE 11/18/2023 1700   LEUKOCYTESUR NEGATIVE 11/18/2023 1700   This document was prepared using Dragon Voice Recognition software and may include unintentional dictation errors.  Dr. Sherre [facility]  If 7PM-7AM, please contact overnight-coverage provider If 7AM-7PM, please contact day attending provider [email or web address]  12/21/2023, 9:39 PM

## 2023-12-21 NOTE — Assessment & Plan Note (Signed)
 Simvastatin  40 mg daily, not resumed on admission

## 2023-12-21 NOTE — Assessment & Plan Note (Signed)
 Heparin per pharmacy Metoprolol  5 mg IV every 4 hours as needed for heart rate greater than 120, 3 doses ordered

## 2023-12-21 NOTE — ED Triage Notes (Signed)
 Pt comes in via pov with complaints of off and on vomiting since the end of August. Pt was seen at his primary care doctor today whom sent him over to be admitted for an EGD. Pt has no complaints of pain at this time, and last episode of vomiting was yesterday morning. Pt now has complaints of dry heaving.

## 2023-12-21 NOTE — Assessment & Plan Note (Signed)
-   Protonix 40 mg IV twice daily

## 2023-12-21 NOTE — Assessment & Plan Note (Signed)
 Eliquis  not resumed on admission Heparin per pharmacy

## 2023-12-21 NOTE — Consult Note (Signed)
 Pharmacy Consult Note - Anticoagulation  Pharmacy Consult for heparin Indication: atrial fibrillation  PATIENT MEASUREMENTS: Height: 6' 4 (193 cm) Weight: 94.8 kg (208 lb 15.9 oz) IBW/kg (Calculated) : 86.8 HEPARIN DW (KG): 94.8  VITAL SIGNS: Temp: 98.3 F (36.8 C) (09/30 1444) BP: 129/65 (09/30 1830) Pulse Rate: 72 (09/30 1830)  Recent Labs    12/21/23 1448  HGB 16.1  HCT 49.1  PLT 259  LABPROT 14.9  INR 1.1  CREATININE 0.90    Estimated Creatinine Clearance: 75 mL/min (by C-G formula based on SCr of 0.9 mg/dL).  PAST MEDICAL HISTORY: Past Medical History:  Diagnosis Date   Anxiety    Atrial fibrillation (HCC)    Back pain, chronic    Cancer (HCC)    skin   Colon polyp    Cyst of left kidney    Dementia (HCC)    GERD (gastroesophageal reflux disease)    Heart murmur    Hyperlipemia    Mitral valve prolapse    Neuromuscular disorder (HCC)    Neuropathy    Osteoarthritis    Pulmonary emboli (HCC)    Spinal stenosis    Thyroid  nodule    Vertigo     ASSESSMENT: 83 y.o. male with PMH including Afib on Eliquis  is presenting with nausea & vomiting, currently unable to take oral medications. Patient is on chronic anticoagulation per chart review. Last dose of Eliquis  was yesterday, 9/29. He is set to undergo EGD. Pharmacy has been consulted to initiate and manage heparin intravenous infusion.  Baseline antiXa level, aPTT, and INR have been ordered.  Pertinent medications: Eliquis  2.5mg  PO twice daily  Goal(s) of therapy: Heparin level 0.3 - 0.7 units/mL aPTT 66 - 102 seconds Monitor platelets by anticoagulation protocol: Yes   Baseline anticoagulation labs: Recent Labs    12/21/23 1448  INR 1.1  HGB 16.1  PLT 259   Date Time aPTT/HL Rate/Comment     PLAN: Bolus 2300 units (half bolus) and start heparin infusion at 1400 units/hour. Check aPTT and heparin level 8 hours after drip start, using baseline labs to determine which metric to titrate  based on Titrate by aPTT until heparin level and aPTT correlate, then titrate by heparin level alone. Continue to check heparin level and CBC daily while on heparin infusion.  Will M. Lenon, PharmD, BCPS Clinical Pharmacist 12/21/2023 7:54 PM

## 2023-12-21 NOTE — Assessment & Plan Note (Addendum)
 Midazolam  0.5 mg IV every 6 hours as needed for agitation, anxiety, 2 doses ordered

## 2023-12-22 ENCOUNTER — Inpatient Hospital Stay: Admitting: Anesthesiology

## 2023-12-22 ENCOUNTER — Encounter: Admission: EM | Disposition: A | Discharge status: Skilled Nursing Facility | Payer: Self-pay | Source: Home / Self Care

## 2023-12-22 ENCOUNTER — Encounter: Payer: Self-pay | Admitting: Internal Medicine

## 2023-12-22 DIAGNOSIS — R112 Nausea with vomiting, unspecified: Secondary | ICD-10-CM | POA: Diagnosis not present

## 2023-12-22 DIAGNOSIS — K317 Polyp of stomach and duodenum: Secondary | ICD-10-CM

## 2023-12-22 HISTORY — PX: ESOPHAGOGASTRODUODENOSCOPY: SHX5428

## 2023-12-22 LAB — CBC
HCT: 42.5 % (ref 39.0–52.0)
Hemoglobin: 14.2 g/dL (ref 13.0–17.0)
MCH: 30.9 pg (ref 26.0–34.0)
MCHC: 33.4 g/dL (ref 30.0–36.0)
MCV: 92.6 fL (ref 80.0–100.0)
Platelets: 226 K/uL (ref 150–400)
RBC: 4.59 MIL/uL (ref 4.22–5.81)
RDW: 12.7 % (ref 11.5–15.5)
WBC: 7.2 K/uL (ref 4.0–10.5)
nRBC: 0 % (ref 0.0–0.2)

## 2023-12-22 LAB — BASIC METABOLIC PANEL WITH GFR
Anion gap: 9 (ref 5–15)
BUN: 17 mg/dL (ref 8–23)
CO2: 27 mmol/L (ref 22–32)
Calcium: 8.8 mg/dL — ABNORMAL LOW (ref 8.9–10.3)
Chloride: 106 mmol/L (ref 98–111)
Creatinine, Ser: 0.87 mg/dL (ref 0.61–1.24)
GFR, Estimated: >60 mL/min (ref >60)
Glucose, Bld: 88 mg/dL (ref 70–99)
Potassium: 3.9 mmol/L (ref 3.5–5.1)
Sodium: 142 mmol/L (ref 135–145)

## 2023-12-22 LAB — APTT
aPTT: 107 s — ABNORMAL HIGH (ref 24–36)
aPTT: 124 s — ABNORMAL HIGH (ref 24–36)

## 2023-12-22 LAB — HEPARIN LEVEL (UNFRACTIONATED): Heparin Unfractionated: >1.1 [IU]/mL — ABNORMAL HIGH (ref 0.30–0.70)

## 2023-12-22 SURGERY — EGD (ESOPHAGOGASTRODUODENOSCOPY)
Anesthesia: General

## 2023-12-22 MED ORDER — APIXABAN 2.5 MG PO TABS
2.5000 mg | ORAL_TABLET | Freq: Two times a day (BID) | ORAL | Status: DC
  Administered 2023-12-22 – 2023-12-31 (×18): 2.5 mg via ORAL
  Filled 2023-12-22 (×18): qty 1

## 2023-12-22 MED ORDER — LIDOCAINE HCL (CARDIAC) PF 100 MG/5ML IV SOSY
PREFILLED_SYRINGE | INTRAVENOUS | Status: DC | PRN
  Administered 2023-12-22: 100 mg via INTRAVENOUS

## 2023-12-22 MED ORDER — FLUOXETINE HCL 20 MG PO CAPS
80.0000 mg | ORAL_CAPSULE | Freq: Every day | ORAL | Status: DC
  Administered 2023-12-22 – 2023-12-31 (×10): 80 mg via ORAL
  Filled 2023-12-22 (×9): qty 4

## 2023-12-22 MED ORDER — SODIUM CHLORIDE 0.9 % IV SOLN
INTRAVENOUS | Status: DC
Start: 2023-12-22 — End: 2023-12-22

## 2023-12-22 MED ORDER — MEMANTINE HCL 5 MG PO TABS: 5.0000 mg | ORAL_TABLET | Freq: Two times a day (BID) | ORAL | Status: DC

## 2023-12-22 MED ORDER — NITROFURANTOIN MONOHYD MACRO 100 MG PO CAPS
100.0000 mg | ORAL_CAPSULE | Freq: Every day | ORAL | Status: DC
Start: 2023-12-22 — End: 2023-12-29
  Administered 2023-12-22 – 2023-12-28 (×7): 100 mg via ORAL
  Filled 2023-12-22 (×8): qty 1

## 2023-12-22 MED ORDER — PANTOPRAZOLE SODIUM 40 MG PO TBEC
40.0000 mg | DELAYED_RELEASE_TABLET | Freq: Every day | ORAL | Status: DC
  Administered 2023-12-22 – 2023-12-31 (×10): 40 mg via ORAL
  Filled 2023-12-22 (×11): qty 1

## 2023-12-22 MED ORDER — SIMVASTATIN 20 MG PO TABS
40.0000 mg | ORAL_TABLET | Freq: Every day | ORAL | Status: DC
  Administered 2023-12-22 – 2023-12-31 (×10): 40 mg via ORAL
  Filled 2023-12-22 (×9): qty 2

## 2023-12-22 MED ORDER — PROPOFOL 10 MG/ML IV BOLUS
INTRAVENOUS | Status: DC | PRN
  Administered 2023-12-22: 100 mg via INTRAVENOUS
  Administered 2023-12-22: 30 mg via INTRAVENOUS

## 2023-12-22 MED ORDER — LIDOCAINE HCL (PF) 2 % IJ SOLN
INTRAMUSCULAR | Status: AC
  Filled 2023-12-22: qty 5

## 2023-12-22 MED ORDER — APIXABAN 2.5 MG PO TABS
2.5000 mg | ORAL_TABLET | Freq: Two times a day (BID) | ORAL | Status: DC
Start: 2023-12-22 — End: 2023-12-22

## 2023-12-22 MED ORDER — MECLIZINE HCL 25 MG PO TABS
25.0000 mg | ORAL_TABLET | Freq: Three times a day (TID) | ORAL | Status: DC
  Administered 2023-12-22 – 2023-12-28 (×18): 25 mg via ORAL
  Filled 2023-12-22 (×21): qty 1

## 2023-12-22 MED ORDER — LACTATED RINGERS IV SOLN: INTRAVENOUS | Status: DC

## 2023-12-22 MED ORDER — SODIUM CHLORIDE 0.9 % IV SOLN: 12.5000 mg | Freq: Four times a day (QID) | INTRAVENOUS | Status: DC | PRN

## 2023-12-22 MED ORDER — BUPROPION HCL 75 MG PO TABS
75.0000 mg | ORAL_TABLET | Freq: Every day | ORAL | Status: DC
  Administered 2023-12-23 – 2023-12-31 (×9): 75 mg via ORAL
  Filled 2023-12-22 (×9): qty 1

## 2023-12-22 MED ORDER — FINASTERIDE 5 MG PO TABS
5.0000 mg | ORAL_TABLET | Freq: Every day | ORAL | Status: DC
  Administered 2023-12-22 – 2023-12-31 (×10): 5 mg via ORAL
  Filled 2023-12-22 (×9): qty 1

## 2023-12-22 MED ORDER — ONDANSETRON HCL 4 MG/2ML IJ SOLN
4.0000 mg | Freq: Four times a day (QID) | INTRAMUSCULAR | Status: DC | PRN
  Administered 2023-12-23 – 2023-12-30 (×4): 4 mg via INTRAVENOUS
  Filled 2023-12-22 (×4): qty 2

## 2023-12-22 MED ORDER — TAMSULOSIN HCL 0.4 MG PO CAPS
0.4000 mg | ORAL_CAPSULE | Freq: Every day | ORAL | Status: DC
  Administered 2023-12-22 – 2023-12-31 (×10): 0.4 mg via ORAL
  Filled 2023-12-22 (×9): qty 1

## 2023-12-22 NOTE — Consult Note (Signed)
 Pharmacy Consult Note - Anticoagulation  Pharmacy Consult for heparin Indication: atrial fibrillation  PATIENT MEASUREMENTS: Height: 6' 4 (193 cm) Weight: 94.8 kg (208 lb 15.9 oz) IBW/kg (Calculated) : 86.8 HEPARIN DW (KG): 94.8  VITAL SIGNS: Temp: 98 F (36.7 C) (10/01 1414) Temp Source: Oral (10/01 1414) BP: 100/75 (10/01 1414) Pulse Rate: 71 (10/01 1414)  Recent Labs    12/21/23 1448 12/21/23 2131 12/22/23 0537 12/22/23 1549  HGB 16.1  --  14.2  --   HCT 49.1  --  42.5  --   PLT 259  --  226  --   APTT  --    < > 107* 124*  LABPROT 14.9  --   --   --   INR 1.1  --   --   --   HEPARINUNFRC  --    < > >1.10*  --   CREATININE 0.90  --  0.87  --    < > = values in this interval not displayed.    Estimated Creatinine Clearance: 77.6 mL/min (by C-G formula based on SCr of 0.87 mg/dL).  PAST MEDICAL HISTORY: Past Medical History:  Diagnosis Date   Anxiety    Atrial fibrillation (HCC)    Back pain, chronic    Cancer (HCC)    skin   Colon polyp    Cyst of left kidney    Dementia (HCC)    GERD (gastroesophageal reflux disease)    Heart murmur    Hyperlipemia    Mitral valve prolapse    Neuromuscular disorder (HCC)    Neuropathy    Osteoarthritis    Pulmonary emboli (HCC)    Spinal stenosis    Thyroid  nodule    Vertigo     ASSESSMENT: 83 y.o. male with PMH including Afib on Eliquis  is presenting with nausea & vomiting, currently unable to take oral medications. Patient is on chronic anticoagulation per chart review. Last dose of Eliquis  was yesterday, 9/29. He is set to undergo EGD. Pharmacy has been consulted to initiate and manage heparin intravenous infusion.  Baseline antiXa level, aPTT, and INR have been ordered.  Pertinent medications: Eliquis  2.5mg  PO twice daily  Goal(s) of therapy: Heparin level 0.3 - 0.7 units/mL aPTT 66 - 102 seconds Monitor platelets by anticoagulation protocol: Yes   Baseline anticoagulation labs: Recent Labs     12/21/23 1448 12/21/23 2131 12/22/23 0537 12/22/23 1549  APTT  --  28 107* 124*  INR 1.1  --   --   --   HGB 16.1  --  14.2  --   PLT 259  --  226  --    Date Time aPTT/HL Rate/Comment 1001 0537 107 / >1.1 Supratherapeutic  1001 1549 aPTT= 124 SUPRAtherapeutic    PLAN: 1001 1549 aPTT= 124 SUPRAtherapeutic  Decrease heparin infusion to 1100 units/hr Heparin drip set to stop at 2159 and to start apixaban  at 2200 Titrate by aPTT until heparin level and aPTT correlate, then titrate by heparin level alone. Continue to check heparin level and CBC daily while on heparin infusion.  Allean Haas PharmD Clinical Pharmacist 12/22/2023

## 2023-12-22 NOTE — Progress Notes (Signed)
 Patient is currently undergoing a ultrasound og the legs.

## 2023-12-22 NOTE — Anesthesia Postprocedure Evaluation (Signed)
 Anesthesia Post Note  Patient: Mark [name].  Procedure(s) Performed: EGD (ESOPHAGOGASTRODUODENOSCOPY)  Patient location during evaluation: PACU Anesthesia Type: General Level of consciousness: sedated Pain management: pain level controlled Vital Signs Assessment: post-procedure vital signs reviewed and stable Respiratory status: spontaneous breathing Cardiovascular status: stable Anesthetic complications: no   No notable events documented.   Last Vitals:  Vitals:   12/22/23 1325 12/22/23 1335  BP: (!) 119/54   Pulse:  83  Resp:    Temp: 36.9 C   SpO2:  95%    Last Pain:  Vitals:   12/22/23 1345  TempSrc:   PainSc: 0-No pain                 VAN STAVEREN,Rhea Kaelin

## 2023-12-22 NOTE — Transfer of Care (Signed)
 Immediate Anesthesia Transfer of Care Note  Patient: Mark [name].  Procedure(s) Performed: EGD (ESOPHAGOGASTRODUODENOSCOPY)  Patient Location: PACU  Anesthesia Type:MAC  Level of Consciousness: drowsy  Airway & Oxygen Therapy: Patient Spontanous Breathing and Patient connected to nasal cannula oxygen  Post-op Assessment: Report given to RN and Post -op Vital signs reviewed and stable  Post vital signs: Reviewed and stable  Last Vitals:  Vitals Value Taken Time  BP 119/54 12/22/23 13:25  Temp    Pulse 80 12/22/23 13:26  Resp 20 12/22/23 13:26  SpO2 93 % 12/22/23 13:26  Vitals shown include unfiled device data.  Last Pain:  Vitals:   12/22/23 1214  TempSrc: Temporal  PainSc:          Complications: No notable events documented.

## 2023-12-22 NOTE — Consult Note (Signed)
 Pharmacy Consult Note - Anticoagulation  Pharmacy Consult for heparin Indication: atrial fibrillation  PATIENT MEASUREMENTS: Height: 6' 4 (193 cm) Weight: 94.8 kg (208 lb 15.9 oz) IBW/kg (Calculated) : 86.8 HEPARIN DW (KG): 94.8  VITAL SIGNS: Temp: 98.6 F (37 C) (10/01 0341) Temp Source: Oral (09/30 2041) BP: 124/48 (10/01 0341) Pulse Rate: 88 (10/01 0341)  Recent Labs    12/21/23 1448 12/21/23 2131 12/22/23 0537  HGB 16.1  --  14.2  HCT 49.1  --  42.5  PLT 259  --  226  APTT  --    < > 107*  LABPROT 14.9  --   --   INR 1.1  --   --   HEPARINUNFRC  --    < > >1.10*  CREATININE 0.90  --   --    < > = values in this interval not displayed.    Estimated Creatinine Clearance: 75 mL/min (by C-G formula based on SCr of 0.9 mg/dL).  PAST MEDICAL HISTORY: Past Medical History:  Diagnosis Date   Anxiety    Atrial fibrillation (HCC)    Back pain, chronic    Cancer (HCC)    skin   Colon polyp    Cyst of left kidney    Dementia (HCC)    GERD (gastroesophageal reflux disease)    Heart murmur    Hyperlipemia    Mitral valve prolapse    Neuromuscular disorder (HCC)    Neuropathy    Osteoarthritis    Pulmonary emboli (HCC)    Spinal stenosis    Thyroid  nodule    Vertigo     ASSESSMENT: 83 y.o. male with PMH including Afib on Eliquis  is presenting with nausea & vomiting, currently unable to take oral medications. Patient is on chronic anticoagulation per chart review. Last dose of Eliquis  was yesterday, 9/29. He is set to undergo EGD. Pharmacy has been consulted to initiate and manage heparin intravenous infusion.  Baseline antiXa level, aPTT, and INR have been ordered.  Pertinent medications: Eliquis  2.5mg  PO twice daily  Goal(s) of therapy: Heparin level 0.3 - 0.7 units/mL aPTT 66 - 102 seconds Monitor platelets by anticoagulation protocol: Yes   Baseline anticoagulation labs: Recent Labs    12/21/23 1448 12/21/23 2131 12/22/23 0537  APTT  --  28 107*   INR 1.1  --   --   HGB 16.1  --  14.2  PLT 259  --  226   Date Time aPTT/HL Rate/Comment 1001 0537 107 / >1.1 Supratherapeutic    PLAN: Decrease heparin infusion to 1300 units/hr Recheck aPTT in 8 hours after rate change Titrate by aPTT until heparin level and aPTT correlate, then titrate by heparin level alone. Continue to check heparin level and CBC daily while on heparin infusion.  Rankin CANDIE Dills, PharmD, [facility] 12/22/2023 7:04 AM

## 2023-12-22 NOTE — Anesthesia Preprocedure Evaluation (Addendum)
 Anesthesia Evaluation  Patient identified by MRN, date of birth, ID band Patient awake and Patient confused    Reviewed: Allergy & Precautions, NPO status , Patient's Chart, lab work & pertinent test results  Airway Mallampati: II  TM Distance: >3 FB Neck ROM: full    Dental  (+) Teeth Intact   Pulmonary Patient abstained from smoking., former smoker   Pulmonary exam normal breath sounds clear to auscultation       Cardiovascular Exercise Tolerance: Poor + dysrhythmias Atrial Fibrillation  Rhythm:Regular Rate:Normal     Neuro/Psych    Depression   Dementia negative neurological ROS  negative psych ROS   GI/Hepatic negative GI ROS, Neg liver ROS,GERD  Medicated,,  Endo/Other  negative endocrine ROS    Renal/GU negative Renal ROS  negative genitourinary   Musculoskeletal  (+) Arthritis ,    Abdominal   Peds negative pediatric ROS (+)  Hematology negative hematology ROS (+)   Anesthesia Other Findings Past Medical History: No date: Anxiety No date: Atrial fibrillation (HCC) No date: Back pain, chronic No date: Cancer (HCC)     Comment:  skin No date: Colon polyp No date: Cyst of left kidney No date: Dementia (HCC) No date: GERD (gastroesophageal reflux disease) No date: Heart murmur No date: Hyperlipemia No date: Mitral valve prolapse No date: Neuromuscular disorder (HCC) No date: Neuropathy No date: Osteoarthritis No date: Pulmonary emboli (HCC) No date: Spinal stenosis No date: Thyroid  nodule No date: Vertigo  Past Surgical History: No date: APPENDECTOMY No date: BACK SURGERY 08/15/2014: COLONOSCOPY; N/A     Comment:  Procedure: COLONOSCOPY;  Surgeon: Lamar ONEIDA Holmes, MD;               Location: [facility] ENDOSCOPY;  Service: Endoscopy;                Laterality: N/A; 08/15/2014: ESOPHAGOGASTRODUODENOSCOPY; N/A     Comment:  Procedure: ESOPHAGOGASTRODUODENOSCOPY (EGD);  Surgeon:               Lamar ONEIDA Holmes, MD;  Location: [facility] ENDOSCOPY;                Service: Endoscopy;  Laterality: N/A; 10/22/2022: IR KYPHO LUMBAR INC FX REDUCE BONE BX UNI/BIL CANNULATION  INC/IMAGING 10/15/2022: IR RADIOLOGIST EVAL & MGMT 11/03/2022: IR RADIOLOGIST EVAL & MGMT 11/05/2022: IR RADIOLOGIST EVAL & MGMT No date: TONSILLECTOMY  BMI    Body Mass Index: 25.44 kg/m      Reproductive/Obstetrics negative OB ROS                              Anesthesia Physical Anesthesia Plan  ASA: 3  Anesthesia Plan: General   Post-op Pain Management:    Induction: Intravenous  PONV Risk Score and Plan: Propofol  infusion and TIVA  Airway Management Planned: Natural Airway and Nasal Cannula  Additional Equipment:   Intra-op Plan:   Post-operative Plan:   Informed Consent: I have reviewed the patients History and Physical, chart, labs and discussed the procedure including the risks, benefits and alternatives for the proposed anesthesia with the patient or authorized representative who has indicated his/her understanding and acceptance.     Consent reviewed with POA  Plan Discussed with: CRNA  Anesthesia Plan Comments:          Anesthesia Quick Evaluation

## 2023-12-22 NOTE — Progress Notes (Addendum)
 PROGRESS NOTE    Mark [name].  FMW:[id number] DOB: [birth year] DOA: 12/21/2023 PCP: Sadie Manna, MD  Chief Complaint  Patient presents with   Emesis    Hospital Course:  Mark [name]. is an 83 year old male with history of GERD, paroxysmal atrial fibrillation, on chronic anticoagulation with Eliquis , hyperlipidemia, factor V Leyden, who presents ED from PCP for chief concerns of intractable nausea and vomiting. Presented with nausea, vomiting for the last 5 to 6 weeks, which reportedly worsened for 1 week. Admitted for further management  Subjective: Patient was examined at the bedside, denies any symptoms.  Wife reports patient had symptoms earlier today, and does not remember due to his memory decline. Reports symptoms associated with change in position, on standing.  Will check orthostatic vitals and meclizine  trial   Objective: Vitals:   12/21/23 2104 12/22/23 0053 12/22/23 0341 12/22/23 0742  BP: 122/68 119/76 (!) 124/48 (!) 117/57  Pulse: 82 88 88 78  Resp: 15 18 18 17   Temp: 97.7 F (36.5 C) 98.6 F (37 C) 98.6 F (37 C) 98.4 F (36.9 C)  TempSrc:      SpO2: 98% 96% 95% 95%  Weight:      Height:        Intake/Output Summary (Last 24 hours) at 12/22/2023 0851 Last data filed at 12/22/2023 0300 Gross per 24 hour  Intake 601.45 ml  Output --  Net 601.45 ml   Filed Weights   12/21/23 1445  Weight: 94.8 kg    Examination: Constitutional: appears age-appropriate, frail, NAD, calm Neck: normal, supple, no masses, no thyromegaly Respiratory: clear to auscultation bilaterally, no wheezing, no crackles. Normal respiratory effort. No accessory muscle use.  Cardiovascular: Regular rate and rhythm, no murmurs / rubs / gallops. No extremity edema. 2+ pedal pulses. No carotid bruits.  Abdomen: no tenderness, no masses palpated, no hepatosplenomegaly. Bowel sounds positive.  Musculoskeletal: no clubbing / cyanosis. No joint deformity upper and lower  extremities. Good ROM, no contractures, no atrophy. Normal muscle tone.  Skin: no rashes, lesions, ulcers. No induration Neurologic: Sensation intact. Strength 5/5 in all 4.  Psychiatric: Normal judgment and insight. Alert and oriented x 3. Normal mood  Assessment & Plan:  Intractable nausea and vomiting Etiology unclear at this time -Was discharged home by PCP on Zofran , adjusted gabapentin dose - Wife reports waxing and waning nausea/vomiting, reports 12 pounds weight loss due to poor appetite - EGD done 10/01, no identifiable cause for his symptoms - MRI brain 08/25 no acute abnormality - Seen by GI, appreciate recs - Ondansetron  prn, Phenergan  prn - Continue with LR infusion, FLD will advance as tolerated - Wife reports nausea worsens with standing, ? If arising from vestibular disorders - Orthostatic vitals and Meclizine  trial - RD consult   PAF (paroxysmal atrial fibrillation) (HCC) - Resume Eliquis , Stop IV Heparin   Hyperlipidemia Simvastatin  40 mg daily   GERD (gastroesophageal reflux disease) PPI   Recurrent UTI - Continue Macrobid  per PCP   - PT/OT eval  DVT prophylaxis: Eliquis    Code Status: Full Code Disposition:  TBD  Consultants:  Treatment Team:  Consulting Physician: Unk Corinn Skiff, MD Consulting Physician: Jinny Carmine, MD  Procedures:  EGD  Antimicrobials:  Anti-infectives (From admission, onward)    None       Data Reviewed: I have personally reviewed following labs and imaging studies CBC: Recent Labs  Lab 12/21/23 1448 12/22/23 0537  WBC 7.4 7.2  HGB 16.1 14.2  HCT 49.1 42.5  MCV 94.1 92.6  PLT 259 226   Basic Metabolic Panel: Recent Labs  Lab 12/21/23 1448 12/22/23 0537  NA 138 142  K 3.9 3.9  CL 103 106  CO2 24 27  GLUCOSE 114* 88  BUN 19 17  CREATININE 0.90 0.87  CALCIUM 9.4 8.8*   GFR: Estimated Creatinine Clearance: 77.6 mL/min (by C-G formula based on SCr of 0.87 mg/dL). Liver Function Tests: Recent  Labs  Lab 12/21/23 1448  AST 23  ALT 21  ALKPHOS 68  BILITOT 1.1  PROT 7.4  ALBUMIN 3.8   CBG: No results for input(s): GLUCAP in the last 168 hours.  No results found for this or any previous visit (from the past 240 hours).   Radiology Studies: DG Chest Port 1 View Result Date: 12/21/2023 CLINICAL DATA:  Nausea vomiting EXAM: PORTABLE CHEST 1 VIEW COMPARISON:  08/02/2022 FINDINGS: No acute airspace disease or effusion. Stable cardiomediastinal silhouette. No pneumothorax. Mild coarse chronic interstitial opacities. Advanced right shoulder degenerative change IMPRESSION: No active disease. Mild chronic interstitial changes. Electronically Signed   By: Luke Bun M.D.   On: 12/21/2023 20:06    Scheduled Meds:  pantoprazole  (PROTONIX ) IV  40 mg Intravenous BID   Continuous Infusions:  heparin 1,300 Units/hr (12/22/23 0757)   lactated ringers  125 mL/hr at 12/21/23 2248   promethazine  (PHENERGAN ) injection (IM or IVPB)       LOS: 1 day  MDM: Patient is high risk for one or more organ failure.  They necessitate ongoing hospitalization for continued IV therapies and subsequent lab monitoring. Total time spent interpreting labs and vitals, reviewing the medical record, coordinating care amongst consultants and care team members, directly assessing and discussing care with the patient and/or family: 55 min Laree Lock, MD [facility]  To contact the attending physician between 7A-7P please use Epic Chat. To contact the covering physician during after hours 7P-7A, please review Amion.  12/22/2023, 8:51 AM   *This document has been created with the assistance of dictation software. Please excuse typographical errors. *

## 2023-12-22 NOTE — OR Nursing (Signed)
 Verified with Dr. Jinny that heparin drip does not need to be paused for EGD, nor does the pt's Factor V leyden need to be addressed. Just taking a look with scope

## 2023-12-22 NOTE — Op Note (Signed)
 [facility] Gastroenterology Patient Name: Mark [name] Procedure Date: 12/22/2023 12:33 PM MRN: [id number] Account #: [id number] Date of Birth: 08-Feb-1941 Admit Type: Outpatient Age: 83 Room: [facility] ENDO ROOM 2 Gender: Male Note Status: Finalized Instrument Name: Upper GI Scope [id number] Procedure:             Upper GI endoscopy Indications:           Nausea with vomiting Providers:             [name] MD, MD Referring MD:          Tamra Leventhal, MD (Referring MD) Medicines:             Propofol  per Anesthesia Complications:         No immediate complications. Procedure:             Pre-Anesthesia Assessment:                        - Prior to the procedure, a History and Physical was                         performed, and patient medications and allergies were                         reviewed. The patient's tolerance of previous                         anesthesia was also reviewed. The risks and benefits                         of the procedure and the sedation options and risks                         were discussed with the patient. All questions were                         answered, and informed consent was obtained. Prior                         Anticoagulants: The patient has taken no anticoagulant                         or antiplatelet agents. ASA Grade Assessment: II - A                         patient with mild systemic disease. After reviewing                         the risks and benefits, the patient was deemed in                         satisfactory condition to undergo the procedure.                        After obtaining informed consent, the endoscope was                         passed under direct vision. Throughout the procedure,  the patient's blood pressure, pulse, and oxygen                         saturations were monitored continuously. The Endoscope                         was introduced through the mouth, and  advanced to the                         second part of duodenum. The upper GI endoscopy was                         accomplished without difficulty. The patient tolerated                         the procedure well. Findings:      A small hiatal hernia was present.      Multiple sessile polyps with no stigmata of recent bleeding were found       in the entire examined stomach.      The examined duodenum was normal. Impression:            - Small hiatal hernia.                        - Multiple gastric polyps.                        - Normal examined duodenum.                        - No specimens collected. Recommendation:        - Return patient to hospital ward for ongoing care.                        - Resume previous diet.                        - Continue present medications. Procedure Code(s):     --- Professional ---                        [phone], Esophagogastroduodenoscopy, flexible,                         transoral; diagnostic, including collection of                         specimen(s) by brushing or washing, when performed                         (separate procedure) Diagnosis Code(s):     --- Professional ---                        R11.2, Nausea with vomiting, unspecified                        K31.7, Polyp of stomach and duodenum CPT copyright 2022 [employer]. All rights reserved. The codes documented in this report are preliminary and upon coder review may  be revised to meet current compliance requirements. [name] MD, MD 12/22/2023  1:32:31 PM This report has been signed electronically. Number of Addenda: 0 Note Initiated On: 12/22/2023 12:33 PM Estimated Blood Loss:  Estimated blood loss: none.      [facility]

## 2023-12-22 NOTE — Consult Note (Signed)
 Mark Copping, MD [facility]  [address], [address] [place], [place] [address] Phone: [phone] Fax : [phone]  Consultation  Referring Provider:     Dr. Sherre Primary Care Physician:  Mark Manna, MD Primary Gastroenterologist: [facility]         Reason for Consultation:     Nausea and vomiting  Date of Admission:  12/21/2023 Date of Consultation:  12/22/2023           HPI:   Mark Sabedra. is a 83 y.o. male with a history of GERD hyperlipidemia and atrial fibrillation who also has a history of a PE.  The patient presented to the emergency department with nausea and vomiting for the last 5 to 6 weeks.  It has been reported to be worse over the last week.  It was reported by the patient's wife that over the last 3 to 4 days the patient has only been able to tolerate a half of a container of Ensure and 1 to 2 pieces of toast.  The patient has a history of having a upper endoscopy and colonoscopy by Dr. Viktoria in 2016 with gastric polyps found at that time.  The patient was seen by his primary care provider back on 5 September and he was sent home with fluids and a report of a possible EGD.  The patient's PCP also referenced a ER visit on August 28 for nausea and vomiting and reported the CT scan at that time showing no acute intra-abdominal or pelvic pathology with mild diverticulosis in the sigmoid colon.  He had written in his note that the patient had lost some weight but his appetite was picking up.  He was also sent home with Zofran  told to continue his MicroBid for recurrent UTIs and had his gabapentin dose adjusted.  On admission yesterday the patient CBC was normal with his CMP showing normal BUN and creatinine and an otherwise unremarkable BMP.  The patient's wife states the patient has the symptoms waxing and waning with his nausea and vomiting stopping for a day or so and then coming back.  He is now actively retching in the hospital room while sitting on the toilet while being  examined.  The patient's wife states that the patient has lost 12 pounds in the last month because of this nausea vomiting and not eating.  Past Medical History:  Diagnosis Date   Anxiety    Atrial fibrillation (HCC)    Back pain, chronic    Cancer (HCC)    skin   Colon polyp    Cyst of left kidney    Dementia (HCC)    GERD (gastroesophageal reflux disease)    Heart murmur    Hyperlipemia    Mitral valve prolapse    Neuromuscular disorder (HCC)    Neuropathy    Osteoarthritis    Pulmonary emboli (HCC)    Spinal stenosis    Thyroid  nodule    Vertigo     Past Surgical History:  Procedure Laterality Date   APPENDECTOMY     BACK SURGERY     COLONOSCOPY N/A 08/15/2014   Procedure: COLONOSCOPY;  Surgeon: Mark ONEIDA Viktoria, MD;  Location: [facility] ENDOSCOPY;  Service: Endoscopy;  Laterality: N/A;   ESOPHAGOGASTRODUODENOSCOPY N/A 08/15/2014   Procedure: ESOPHAGOGASTRODUODENOSCOPY (EGD);  Surgeon: Mark ONEIDA Viktoria, MD;  Location: [facility] ENDOSCOPY;  Service: Endoscopy;  Laterality: N/A;   IR KYPHO LUMBAR INC FX REDUCE BONE BX UNI/BIL CANNULATION INC/IMAGING  10/22/2022  IR RADIOLOGIST EVAL & MGMT  10/15/2022   IR RADIOLOGIST EVAL & MGMT  11/03/2022   IR RADIOLOGIST EVAL & MGMT  11/05/2022   TONSILLECTOMY      Prior to Admission medications   Medication Sig Start Date End Date Taking? Authorizing Provider  apixaban  (ELIQUIS ) 2.5 MG TABS tablet Take 1 tablet by mouth every 12 (twelve) hours. 06/25/22  Yes [provider]  betamethasone  dipropionate 0.05 % cream Apply 1 Application topically 2 (two) times daily as needed. 09/25/21  Yes [provider]  buPROPion  (WELLBUTRIN ) 75 MG tablet Take 75 mg by mouth daily with breakfast.   Yes [provider]  cholecalciferol (VITAMIN D3) 10 MCG (400 UNIT) TABS tablet Take 400 Units by mouth daily.   Yes [provider]  Coenzyme Q10 (CO Q 10 PO) Take 1 Dose by mouth daily.    Yes [provider]  Cyanocobalamin   (B-12) 5000 MCG CAPS Take 5,000 mg by mouth daily. 02/22/18  Yes Pyreddy, Pavan, MD  docusate sodium  (COLACE) 100 MG capsule Take 300 mg by mouth at bedtime.   Yes [provider]  esomeprazole (NEXIUM) 40 MG capsule Take 1 capsule by mouth daily. 07/09/22  Yes [provider]  finasteride  (PROSCAR ) 5 MG tablet TAKE 1 TABLET BY MOUTH DAILY 02/15/23  Yes McGowan, Clotilda A, PA-C  FLUoxetine  (PROZAC ) 40 MG capsule TAKE 2 CAPSULES (80MG  TOTAL) BY MOUTH ONCE DAILY 12/09/23 12/08/24 Yes Eappen, Saramma, MD  folic acid  (FOLVITE ) 1 MG tablet Take 1 mg by mouth daily. 09/06/23  Yes [provider]  memantine  (NAMENDA ) 5 MG tablet 5 mg 2 (two) times daily. 07/09/22  Yes [provider]  niacinamide 100 MG tablet Take 100 mg by mouth in the morning.   Yes [provider]  nitrofurantoin , macrocrystal-monohydrate, (MACROBID ) 100 MG capsule Take 100 mg by mouth 2 (two) times daily.   Yes [provider]  simvastatin  (ZOCOR ) 40 MG tablet Take 40 mg by mouth daily.   Yes [provider]  tamsulosin  (FLOMAX ) 0.4 MG CAPS capsule Take 1 capsule (0.4 mg total) by mouth daily. 12/24/21  Yes Penne Knee, MD  triamcinolone  (KENALOG) 0.1 % paste 1 Application 2 (two) times daily. 02/04/22  Yes [provider]  gabapentin (NEURONTIN) 300 MG capsule Take 300 mg by mouth 2 (two) times daily. Patient not taking: Reported on 12/21/2023 10/01/22   [provider]  ondansetron  (ZOFRAN ) 4 MG tablet Take 4 mg by mouth every 8 (eight) hours as needed. Patient not taking: Reported on 12/21/2023    [provider]  ondansetron  (ZOFRAN -ODT) 4 MG disintegrating tablet Take 1 tablet (4 mg total) by mouth every 8 (eight) hours as needed for nausea or vomiting. Patient not taking: Reported on 12/21/2023 08/01/22   Sung, Jade J, MD  promethazine  (PHENERGAN ) 25 MG suppository Place 1 suppository (25 mg total) rectally every 6 (six) hours as needed for nausea  or vomiting. Patient not taking: Reported on 12/21/2023 11/18/23   Nicholaus Rolland BRAVO, MD    Family History  Problem Relation Age of Onset   Stroke Mother    COPD Father      Social History   Tobacco Use   Smoking status: Former    Current packs/day: 0.00    Types: Cigarettes    Quit date: 04/20/1958    Years since quitting: 65.7   Smokeless tobacco: Never  Vaping Use   Vaping status: Never Used  Substance Use Topics   Alcohol use:  Yes    Alcohol/week: 1.0 standard drink of alcohol    Types: 1 Glasses of wine per week   Drug use: Never    Allergies as of 12/21/2023 - Review Complete 12/21/2023  Allergen Reaction Noted   Penicillins Swelling and Rash 08/02/2014   Donepezil Other (See Comments) 03/05/2022   Ciprofloxacin Other (See Comments) 04/12/2018   Erythromycin Swelling 08/02/2014   Keflex [cephalexin] Swelling 08/14/2014   Penicillamine Other (See Comments) 01/19/2023    Review of Systems:    All systems reviewed and negative except where noted in HPI.   Physical Exam:  Vital signs in last 24 hours: Temp:  [97.7 F (36.5 C)-98.6 F (37 C)] 98.4 F (36.9 C) (10/01 0742) Pulse Rate:  [72-88] 78 (10/01 0742) Resp:  [15-20] 17 (10/01 0742) BP: (117-164)/(48-87) 117/57 (10/01 0742) SpO2:  [90 %-100 %] 95 % (10/01 0742) Weight:  [94.8 kg] 94.8 kg (09/30 1445)   General:   Pleasant, cooperative in NAD Head:  Normocephalic and atraumatic. Eyes:   No icterus.   Conjunctiva pink. PERRLA. Ears:  Normal auditory acuity. Msk:  Symmetrical without gross deformities.   Neurologic:  Alert and oriented x3;  grossly normal neurologically. Skin:  Intact without significant lesions or rashes. Psych:  Alert and cooperative. Normal affect.  LAB RESULTS: Recent Labs    12/21/23 1448 12/22/23 0537  WBC 7.4 7.2  HGB 16.1 14.2  HCT 49.1 42.5  PLT 259 226   BMET Recent Labs    12/21/23 1448 12/22/23 0537  NA 138 142  K 3.9 3.9  CL 103 106  CO2 24 27  GLUCOSE 114*  88  BUN 19 17  CREATININE 0.90 0.87  CALCIUM 9.4 8.8*   LFT Recent Labs    12/21/23 1448  PROT 7.4  ALBUMIN 3.8  AST 23  ALT 21  ALKPHOS 68  BILITOT 1.1   PT/INR Recent Labs    12/21/23 1448  LABPROT 14.9  INR 1.1    STUDIES: DG Chest Port 1 View Result Date: 12/21/2023 CLINICAL DATA:  Nausea vomiting EXAM: PORTABLE CHEST 1 VIEW COMPARISON:  08/02/2022 FINDINGS: No acute airspace disease or effusion. Stable cardiomediastinal silhouette. No pneumothorax. Mild coarse chronic interstitial opacities. Advanced right shoulder degenerative change IMPRESSION: No active disease. Mild chronic interstitial changes. Electronically Signed   By: Luke Bun M.D.   On: 12/21/2023 20:06      Impression / Plan:   Assessment: Principal Problem:   Intractable nausea and vomiting Active Problems:   Anxiety   Chronic anticoagulation   GERD (gastroesophageal reflux disease)   Hyperlipidemia   PAF (paroxysmal atrial fibrillation) (HCC)   MDD (major depressive disorder), recurrent episode, mild   MCI (mild cognitive impairment)   Raji Glinski. is a 83 y.o. y/o male with intractable nausea vomiting for last 5 to 6 weeks which has gotten worse over the last few days.  The patient has been unable to eat anything more than 1 to 2 pieces of toast and a half bottle of Ensure recently although his labs show no sign of dehydration with a normal BUN and creatinine.  The patient is actively retching in the room.  The patient was admitted for his intractable nausea vomiting and for an EGD to evaluate the cause of this patient's issues.  The patient had been referred to [facility] GI as an outpatient but was not able to wait for the appointment so he came to the hospital.  Plan:  The patient will  be set up for an EGD for today.  The patient had been on a heparin drip and did not have it stopped on admission despite GI being consulted for an EGD.  The patient will have the EGD done today to look  for any pathology that may be causing his problems without any intervention taking place.  The patient and his wife have been explained the plan and agree with it.  Thank you for involving me in the care of this patient.      LOS: 1 day   Mark Copping, MD, MD. NOLIA 12/22/2023, 8:18 AM,  Pager [phone] 7am-5pm  Check AMION for 5pm -7am coverage and on weekends   Note: This dictation was prepared with Dragon dictation along with smaller phrase technology. Any transcriptional errors that result from this process are unintentional.

## 2023-12-23 DIAGNOSIS — R112 Nausea with vomiting, unspecified: Secondary | ICD-10-CM | POA: Diagnosis not present

## 2023-12-23 LAB — BASIC METABOLIC PANEL WITH GFR
Anion gap: 8 (ref 5–15)
BUN: 12 mg/dL (ref 8–23)
CO2: 23 mmol/L (ref 22–32)
Calcium: 8.5 mg/dL — ABNORMAL LOW (ref 8.9–10.3)
Chloride: 106 mmol/L (ref 98–111)
Creatinine, Ser: 0.78 mg/dL (ref 0.61–1.24)
GFR, Estimated: >60 mL/min (ref >60)
Glucose, Bld: 85 mg/dL (ref 70–99)
Potassium: 3.2 mmol/L — ABNORMAL LOW (ref 3.5–5.1)
Sodium: 137 mmol/L (ref 135–145)

## 2023-12-23 LAB — CBC
HCT: 39.1 % (ref 39.0–52.0)
Hemoglobin: 12.9 g/dL — ABNORMAL LOW (ref 13.0–17.0)
MCH: 30.8 pg (ref 26.0–34.0)
MCHC: 33 g/dL (ref 30.0–36.0)
MCV: 93.3 fL (ref 80.0–100.0)
Platelets: 197 K/uL (ref 150–400)
RBC: 4.19 MIL/uL — ABNORMAL LOW (ref 4.22–5.81)
RDW: 12.5 % (ref 11.5–15.5)
WBC: 5.4 K/uL (ref 4.0–10.5)
nRBC: 0 % (ref 0.0–0.2)

## 2023-12-23 LAB — MAGNESIUM: Magnesium: 2.1 mg/dL (ref 1.7–2.4)

## 2023-12-23 MED ORDER — PREDNISONE 20 MG PO TABS
30.0000 mg | ORAL_TABLET | Freq: Once | ORAL | Status: AC
  Administered 2023-12-26: 30 mg via ORAL
  Filled 2023-12-23: qty 1

## 2023-12-23 MED ORDER — PROCHLORPERAZINE EDISYLATE 10 MG/2ML IJ SOLN: 5.0000 mg | INTRAMUSCULAR | Status: DC | PRN

## 2023-12-23 MED ORDER — ADULT MULTIVITAMIN W/MINERALS CH
1.0000 | ORAL_TABLET | Freq: Every day | ORAL | Status: DC
  Administered 2023-12-24 – 2023-12-31 (×8): 1 via ORAL
  Filled 2023-12-23 (×9): qty 1

## 2023-12-23 MED ORDER — PREDNISONE 20 MG PO TABS
20.0000 mg | ORAL_TABLET | Freq: Every day | ORAL | Status: AC
  Administered 2023-12-27: 20 mg via ORAL
  Filled 2023-12-23: qty 1

## 2023-12-23 MED ORDER — PROCHLORPERAZINE MALEATE 5 MG PO TABS
5.0000 mg | ORAL_TABLET | Freq: Four times a day (QID) | ORAL | Status: DC | PRN
Start: 2023-12-23 — End: 2023-12-31

## 2023-12-23 MED ORDER — PREDNISONE 50 MG PO TABS
50.0000 mg | ORAL_TABLET | Freq: Once | ORAL | Status: AC
  Administered 2023-12-24: 50 mg via ORAL
  Filled 2023-12-23: qty 1

## 2023-12-23 MED ORDER — ENSURE PLUS HIGH PROTEIN PO LIQD
237.0000 mL | Freq: Two times a day (BID) | ORAL | Status: DC
Start: 2023-12-23 — End: 2023-12-31
  Administered 2023-12-23 – 2023-12-31 (×15): 237 mL via ORAL

## 2023-12-23 MED ORDER — POTASSIUM CHLORIDE 20 MEQ PO PACK
40.0000 meq | PACK | Freq: Once | ORAL | Status: AC
  Administered 2023-12-23: 40 meq via ORAL
  Filled 2023-12-23: qty 2

## 2023-12-23 MED ORDER — PREDNISONE 10 MG PO TABS
10.0000 mg | ORAL_TABLET | Freq: Once | ORAL | Status: AC
  Administered 2023-12-28: 10 mg via ORAL
  Filled 2023-12-23: qty 1

## 2023-12-23 MED ORDER — PREDNISONE 50 MG PO TABS
60.0000 mg | ORAL_TABLET | Freq: Once | ORAL | Status: AC
  Administered 2023-12-23: 60 mg via ORAL
  Filled 2023-12-23: qty 1

## 2023-12-23 MED ORDER — POTASSIUM CHLORIDE CRYS ER 20 MEQ PO TBCR
40.0000 meq | EXTENDED_RELEASE_TABLET | ORAL | Status: DC
  Administered 2023-12-23: 40 meq via ORAL
  Filled 2023-12-23 (×2): qty 2

## 2023-12-23 MED ORDER — PREDNISONE 20 MG PO TABS: 20.0000 mg | ORAL_TABLET | Freq: Every day | ORAL | Status: DC

## 2023-12-23 MED ORDER — PREDNISONE 20 MG PO TABS
40.0000 mg | ORAL_TABLET | Freq: Once | ORAL | Status: AC
  Administered 2023-12-25: 40 mg via ORAL
  Filled 2023-12-23 (×2): qty 2

## 2023-12-23 MED ORDER — INFLUENZA VAC SPLIT HIGH-DOSE 0.5 ML IM SUSY
0.5000 mL | PREFILLED_SYRINGE | INTRAMUSCULAR | Status: DC
Start: 2023-12-24 — End: 2023-12-23

## 2023-12-23 NOTE — Evaluation (Signed)
 Physical Therapy Evaluation Patient Details Name: Mark [name]. MRN: [id number] DOB: January 24, 1941 Today's Date: 12/23/2023  History of Present Illness  [name], Mark [name]. is an 83 year old male with history of GERD, paroxysmal atrial fibrillation, on chronic anticoagulation with Eliquis , hyperlipidemia, factor V Leyden, who presents ED from PCP for chief concerns of intractable nausea and vomiting. MD diagnoses include: paroxysmal atrial fibrillation, GERD, hyperlipidemia, anxiety, and chronic anticoagulation.  Clinical Impression  Pt is pleasant 83 y.o. male admitted for intractable nausea and vomiting. Spouse is at bedside to assist answering questions about PLOF; pt unable to answer accurately d/t poor cognition.  Prior to hospitalization, pt uses transport chair for mobility at baseline with family members assisting with pushing the chair. Pt requires assistance with ADLs, which his wife primarily helps with. Pt able to transfer from different surfaces with assistance from his wife at baseline, however has had multiple falls in the past 6months d/t LOB. Pt now requires min A for bed mobility and transfers. Unsafe to attempt ambulation d/t pt being symptomatic and orthostatic. Pt demonstrates deficits in strength/balance/activity tolerance. Would benefit from skilled PT to address above deficits and promote optimal return to PLOF.  BP readings were the following: Supine: 114/67mmHg, MAP: 79, HR:82 bpm Sitting: 99/78mmHg, MAP: 69, HR: 91bpm Standing: 80/9mmHg, MAP: 71, HR:95bpm    If plan is discharge home, recommend the following: A lot of help with walking and/or transfers;A lot of help with bathing/dressing/bathroom;Supervision due to cognitive status;Assist for transportation;Direct supervision/assist for medications management   Can travel by private vehicle   No    Equipment Recommendations Other (comment) (TBD at next venue)  Recommendations for Other Services       Functional  Status Assessment Patient has had a recent decline in their functional status and demonstrates the ability to make significant improvements in function in a reasonable and predictable amount of time.     Precautions / Restrictions Precautions Precautions: Fall Recall of Precautions/Restrictions: Impaired Restrictions Weight Bearing Restrictions Per Provider Order: No      Mobility  Bed Mobility Overal bed mobility: Needs Assistance Bed Mobility: Supine to Sit     Supine to sit: Min assist     General bed mobility comments: Min A for trunk management. Verbal/tactile cuing for hand placement and sequencing.    Transfers Overall transfer level: Needs assistance Equipment used: Rolling walker (2 wheels) Transfers: Sit to/from Stand, Bed to chair/wheelchair/BSC Sit to Stand: Min assist   Step pivot transfers: Min assist       General transfer comment: Min A for lift from lowest bed height. Min A for sequencing and assistance for controlled sit into recliner.    Ambulation/Gait               General Gait Details: NT. Unsafe to attempt d/t orthostatics and pt being symptomatic.  Stairs            Wheelchair Mobility     Tilt Bed    Modified Rankin (Stroke Patients Only)       Balance Overall balance assessment: Needs assistance Sitting-balance support: Feet supported, Bilateral upper extremity supported Sitting balance-Leahy Scale: Good Sitting balance - Comments: Able to maintain sitting balance at EOB. Pt reports dizziness with supine>sit. Sypmtoms improve with prolonged sit.   Standing balance support: Bilateral upper extremity supported, Reliant on assistive device for balance Standing balance-Leahy Scale: Fair Standing balance comment: No LOB with standing. Pt reports symptoms however is able to tolerate prolonged stand.  Pertinent Vitals/Pain Pain Assessment Pain Assessment: No/denies pain    Home  Living Family/patient expects to be discharged to:: Private residence Living Arrangements: Spouse/significant other Available Help at Discharge: Family Type of Home: House Home Access: Ramped entrance       Home Layout: One level Home Equipment: Agricultural consultant (2 wheels);Cane - single Tax inspector (4 wheels)      Prior Function Prior Level of Function : Needs assist;History of Falls (last six months)             Mobility Comments: Pt uses transport chair at baseline. Pt's family assists with transfers without use of other AD. Pt's spouse reports working with [facility] PT in the past but has not seen progress d/t pt intermittent illness. Pt's spouse reports multiple falls in the past 6 months d/t LOB ADLs Comments: Pt requires assistance with ADLs. Pt's spouse reports spongebathing at baseline.     Extremity/Trunk Assessment   Upper Extremity Assessment Upper Extremity Assessment: Overall WFL for tasks assessed    Lower Extremity Assessment Lower Extremity Assessment: Overall WFL for tasks assessed    Cervical / Trunk Assessment Cervical / Trunk Assessment: Normal  Communication   Communication Communication: No apparent difficulties    Cognition Arousal: Alert Behavior During Therapy: WFL for tasks assessed/performed   PT - Cognitive impairments: History of cognitive impairments                       PT - Cognition Comments: Pt has history of cognitive impairments. Pt's spouse is available to provide information about baseline. Pt unable to answer PLOF questions accurately. Pt is A&Ox3. Not oriented to situation. Following commands: Impaired Following commands impaired: Follows one step commands with increased time, Follows one step commands inconsistently     Cueing Cueing Techniques: Verbal cues, Tactile cues     General Comments General comments (skin integrity, edema, etc.): orthostatic    Exercises     Assessment/Plan    PT  Assessment Patient needs continued PT services  PT Problem List Decreased strength;Decreased activity tolerance;Decreased balance;Decreased mobility;Decreased cognition;Decreased safety awareness       PT Treatment Interventions DME instruction;Functional mobility training;Therapeutic activities;Therapeutic exercise;Balance training;Neuromuscular re-education;Patient/family education    PT Goals (Current goals can be found in the Care Plan section)  Acute Rehab PT Goals Patient Stated Goal: none stated PT Goal Formulation: With patient Time For Goal Achievement: 01/06/24 Potential to Achieve Goals: Fair    Frequency Min 2X/week     Co-evaluation               AM-PAC PT 6 Clicks Mobility  Outcome Measure Help needed turning from your back to your side while in a flat bed without using bedrails?: A Little Help needed moving from lying on your back to sitting on the side of a flat bed without using bedrails?: A Little Help needed moving to and from a bed to a chair (including a wheelchair)?: A Lot Help needed standing up from a chair using your arms (e.g., wheelchair or bedside chair)?: A Little Help needed to walk in hospital room?: A Lot Help needed climbing 3-5 steps with a railing? : Total 6 Click Score: 14    End of Session Equipment Utilized During Treatment: Gait belt Activity Tolerance: Treatment limited secondary to medical complications (Comment) Patient left: in chair;with call bell/phone within reach;with family/visitor present Nurse Communication: Mobility status PT Visit Diagnosis: Unsteadiness on feet (R26.81);Repeated falls (R29.6);Difficulty in walking, not elsewhere classified (R26.2);Dizziness and  giddiness (R42)    Time: 9049-8986 PT Time Calculation (min) (ACUTE ONLY): 23 min   Charges:                 Ason Heslin, SPT   Silvester Reierson 12/23/2023, 12:17 PM

## 2023-12-23 NOTE — Progress Notes (Signed)
 Pt had episode of vomiting when RN attempted to give oral meds. Provider aware. Rosaline DELENA Range, RN

## 2023-12-23 NOTE — Progress Notes (Signed)
 Initial Nutrition Assessment  DOCUMENTATION CODES:   Non-severe (moderate) malnutrition in context of chronic illness  INTERVENTION:   -MVI with minerals daily -Continue regular diet -Ensure Plus High Protein po BID, each supplement provides 350 kcal and 20 grams of protein   NUTRITION DIAGNOSIS:   Moderate Malnutrition related to chronic illness as evidenced by mild fat depletion, mild muscle depletion, moderate muscle depletion, percent weight loss.  GOAL:   Patient will meet greater than or equal to 90% of their needs  MONITOR:   PO intake, Supplement acceptance, Diet advancement  REASON FOR ASSESSMENT:   Consult Assessment of nutrition requirement/status  ASSESSMENT:   Pt with history of GERD, paroxysmal atrial fibrillation, on chronic anticoagulation with Eliquis , hyperlipidemia, factor V Leyden, who presents for chief concerns of intractable nausea and vomiting.  Pt admitted with intractable nausea and vomiting, suspect possibly due to vestibular etiology/ BPPV.   10/1- s/p EGD- reveals small hiatal hernia and multiple gastric polyps, advanced to clear liquid diet, advanced to full liquid diet 10/2- advanced to regular diet  Reviewed I/O's: +1.5 L x 24 hours and +2.1 L since admission  Spoke with pt and wife in room today. Pt sitting up in chair, smiling and eager to engage RD in conversation. Wife reports that pt did well with pt and intake has improved greatly since hospitalization. She reports pt has experienced a general decline in health and intake over past year. Wife shares that intake has been decreased due to cognitive decline. However, pt started having intermittent nausea and vomiting starting in July 2025. Per wife, pt would throw up at random intervals and could not connect any events or foods eaten from the vomiting. Over the past 6 weeks, vomiting has become more frequent and wife noticed he would vomit more if he tried to stand up after eating. She has  been drinking Ensure 2 times daily to help with intake.   Pt wife reports pt has lost about 15# over the past 2 weeks and his UBW is usually about 250#. Pt has experienced a 9.1% wt loss over the past 2 months, which is significant for time frame.   Pt consumed 100% of breakfast today and is eager to try solid foods for lunch. He has not had any vomiting today. He also consumed about 75% of his Ensure. Discussed importance of good meal and supplement intake to promote healing. Pt amenable to continue Ensure at home.   Medications reviewed and include protonix  and prednisone.   Labs reviewed: K: 3.2.    NUTRITION - FOCUSED PHYSICAL EXAM:  Flowsheet Row Most Recent Value  Orbital Region Mild depletion  Upper Arm Region Mild depletion  Thoracic and Lumbar Region No depletion  Buccal Region Mild depletion  Temple Region Moderate depletion  Clavicle Bone Region Mild depletion  Clavicle and Acromion Bone Region Mild depletion  Scapular Bone Region Mild depletion  Dorsal Hand Moderate depletion  Patellar Region Moderate depletion  Anterior Thigh Region Moderate depletion  Posterior Calf Region Moderate depletion  Edema (RD Assessment) None  Hair Reviewed  Eyes Reviewed  Mouth Reviewed  Skin Reviewed  Nails Reviewed    Diet Order:   Diet Order             Diet regular Room service appropriate? Yes; Fluid consistency: Thin  Diet effective now                   EDUCATION NEEDS:   Education needs have been addressed  Skin:  Skin Assessment: Reviewed RN Assessment  Last BM:  12/22/23  Height:   Ht Readings from Last 1 Encounters:  12/21/23 6' 4 (1.93 m)    Weight:   Wt Readings from Last 1 Encounters:  12/21/23 94.8 kg    Ideal Body Weight:  91.8 kg  BMI:  Body mass index is 25.44 kg/m.  Estimated Nutritional Needs:   Kcal:  2200-2400  Protein:  120-135 grams  Fluid:  2.0-2.2 L    Margery ORN, RD, LDN, CDCES Registered Dietitian III Certified  Diabetes Care and Education Specialist If unable to reach this RD, please use RD Inpatient group chat on secure chat between hours of 8am-4 pm daily

## 2023-12-23 NOTE — Plan of Care (Signed)
  Problem: Health Behavior/Discharge Planning: Goal: Ability to manage health-related needs will improve Outcome: Progressing   Problem: Clinical Measurements: Goal: Ability to maintain clinical measurements within normal limits will improve Outcome: Not Progressing

## 2023-12-23 NOTE — Progress Notes (Addendum)
 PROGRESS NOTE    Mark [name].  FMW:[id number] DOB: 11/15/1940 DOA: 12/21/2023 PCP: Sadie Manna, MD  Chief Complaint  Patient presents with   Emesis    Hospital Course:  [name], [name]. is an 83 year old male with history of GERD, paroxysmal atrial fibrillation, on chronic anticoagulation with Eliquis , hyperlipidemia, factor V Leyden, who presents ED from PCP for chief concerns of intractable nausea and vomiting. Presented with nausea, vomiting for the last 5 to 6 weeks, which reportedly worsened for 1 week. Admitted for further management  Subjective: Patient was examined at the bedside, denies any symptoms.  Patient and wife report that he had no further symptoms today.  Advance diet to regular, was doing well and would like to go to rehab at discharge  Later in the day RN reported patient had 1 episode of emesis after taking potassium tab. Will encourage multiple small meals, reduce potassium packet and will monitor   Objective: Vitals:   12/22/23 1414 12/22/23 2032 12/23/23 0426 12/23/23 0732  BP: 100/75 110/67 135/75 127/68  Pulse: 71 71 77 75  Resp: 17 20 18 16   Temp: 98 F (36.7 C) 97.6 F (36.4 C) 98 F (36.7 C) 98.4 F (36.9 C)  TempSrc: Oral Oral Oral Oral  SpO2: 96% 94% 94% 94%  Weight:      Height:        Intake/Output Summary (Last 24 hours) at 12/23/2023 1600 Last data filed at 12/23/2023 1300 Gross per 24 hour  Intake 1405.98 ml  Output --  Net 1405.98 ml   Filed Weights   12/21/23 1445  Weight: 94.8 kg    Examination: Constitutional: appears age-appropriate, frail, NAD, calm Neck: normal, supple, no masses, no thyromegaly Respiratory: clear to auscultation bilaterally, no wheezing, no crackles. Normal respiratory effort. No accessory muscle use.  Cardiovascular: Regular rate and rhythm, no murmurs / rubs / gallops. No extremity edema Abdomen: no tenderness, no masses palpated, no hepatosplenomegaly Skin: no rashes, lesions, ulcers. No  induration Neurologic: Sensation intact. Strength 5/5 in all 4.   Assessment & Plan:  Intractable nausea and vomiting Suspect if due to Vestibular etiology/BPPV - Was discharged home by PCP on Zofran , decreased gabapentin dose, not on opioids - Wife reports waxing and waning nausea/vomiting, reports dizziness and nausea/vomiting associated with change in position/transfers at all times of the day. Denies LOC, headaches, palpitations, new meds - Orthostatic vitals negative in sitting and lying position - EGD done 10/01, no identifiable cause for his symptoms, Seen by GI, appreciate recs - MRI brain 08/25 no acute abnormality - Symptoms better with meclizine  trial - Discussed with Dr.Vaught, ENT - trial of prednisone taper 60mg  - Tolerating regular diet, encourage small portions multiple times of the day - Ondansetron  prn, Compazine prn  Hypokalemia - monitor and replete as needed  Acute drop in hemoglobin - Hb slowly downtrended to 12.9, no evidence of bleeding - Monitor Hb   PAF (paroxysmal atrial fibrillation) (HCC) - Eliquis    Hyperlipidemia Simvastatin  40 mg daily   GERD (gastroesophageal reflux disease) PPI   Recurrent UTI - Continue Macrobid  prophylaxis   - PT/OT eval - rec SNF, may benefit from vestibular rehab  DVT prophylaxis: Eliquis    Code Status: Full Code Disposition:  TBD  Consultants:  Treatment Team:  Consulting Physician: Unk Corinn Skiff, MD Consulting Physician: Jinny Carmine, MD  Procedures:  EGD  Antimicrobials:  Anti-infectives (From admission, onward)    Start     Dose/Rate Route Frequency Ordered Stop  12/22/23 2200  nitrofurantoin  (macrocrystal-monohydrate) (MACROBID ) capsule 100 mg        100 mg Oral Daily at 10 pm 12/22/23 1623         Data Reviewed: I have personally reviewed following labs and imaging studies CBC: Recent Labs  Lab 12/21/23 1448 12/22/23 0537 12/23/23 0224  WBC 7.4 7.2 5.4  HGB 16.1 14.2 12.9*  HCT 49.1  42.5 39.1  MCV 94.1 92.6 93.3  PLT 259 226 197   Basic Metabolic Panel: Recent Labs  Lab 12/21/23 1448 12/22/23 0537 12/23/23 0224  NA 138 142 137  K 3.9 3.9 3.2*  CL 103 106 106  CO2 24 27 23   GLUCOSE 114* 88 85  BUN 19 17 12   CREATININE 0.90 0.87 0.78  CALCIUM 9.4 8.8* 8.5*  MG  --   --  2.1   GFR: Estimated Creatinine Clearance: 84.4 mL/min (by C-G formula based on SCr of 0.78 mg/dL). Liver Function Tests: Recent Labs  Lab 12/21/23 1448  AST 23  ALT 21  ALKPHOS 68  BILITOT 1.1  PROT 7.4  ALBUMIN 3.8   CBG: No results for input(s): GLUCAP in the last 168 hours.  No results found for this or any previous visit (from the past 240 hours).   Radiology Studies: DG Chest Port 1 View Result Date: 12/21/2023 CLINICAL DATA:  Nausea vomiting EXAM: PORTABLE CHEST 1 VIEW COMPARISON:  08/02/2022 FINDINGS: No acute airspace disease or effusion. Stable cardiomediastinal silhouette. No pneumothorax. Mild coarse chronic interstitial opacities. Advanced right shoulder degenerative change IMPRESSION: No active disease. Mild chronic interstitial changes. Electronically Signed   By: Luke Bun M.D.   On: 12/21/2023 20:06    Scheduled Meds:  apixaban   2.5 mg Oral Q12H   buPROPion   75 mg Oral Q breakfast   feeding supplement  237 mL Oral BID BM   finasteride   5 mg Oral Daily   FLUoxetine   80 mg Oral Daily   [START ON 12/24/2023] Influenza vac split trivalent PF  0.5 mL Intramuscular Tomorrow-1000   meclizine   25 mg Oral TID   nitrofurantoin  (macrocrystal-monohydrate)  100 mg Oral Q2200   pantoprazole   40 mg Oral Daily   simvastatin   40 mg Oral Daily   tamsulosin   0.4 mg Oral Daily   Continuous Infusions:     LOS: 2 days  MDM: Patient is high risk for one or more organ failure.  They necessitate ongoing hospitalization for continued IV therapies and subsequent lab monitoring. Total time spent interpreting labs and vitals, reviewing the medical record, coordinating care  amongst consultants and care team members, directly assessing and discussing care with the patient and/or family: 55 min Laree Lock, MD [facility]  To contact the attending physician between 7A-7P please use Epic Chat. To contact the covering physician during after hours 7P-7A, please review Amion.  12/23/2023, 4:00 PM   *This document has been created with the assistance of dictation software. Please excuse typographical errors. *

## 2023-12-23 NOTE — Evaluation (Signed)
 Occupational Therapy Evaluation Patient Details Name: Mark [name]. MRN: [id number] DOB: 09/21/1940 Today's Date: 12/23/2023   History of Present Illness   [name], Mark [name]. is an 83 year old male with history of GERD, paroxysmal atrial fibrillation, on chronic anticoagulation with Eliquis , hyperlipidemia, factor V Leyden, who presents ED from PCP for chief concerns of intractable nausea and vomiting. MD diagnoses include: paroxysmal atrial fibrillation, GERD, hyperlipidemia, anxiety, and chronic anticoagulation.     Clinical Impressions Pt was seen for OT evaluation this date, spouse present and supportive. Prior to hospital admission, pt was requiring assist for transfers and most ADL from spouse. Spouse notes that he has more recently required increased assist for all mobility and ADL. Pt presents with deficits in cognition, activity tolerance, balance, and safety, affecting optimal ADL completion. Pt currently requires MIN-MOD A for ADL transfers with RW and MAX VC for sequencing, MAX A for LB ADL tasks, and endorses dizziness with standing that does not resolve quickly with return to sitting. MD messaged re: TED hose. Pt would benefit from skilled OT services to address noted impairments and functional limitations (see below for any additional details) in order to maximize safety and independence while minimizing future risk of falls, injury, and readmission. Anticipate the need for follow up OT services upon acute hospital DC.    If plan is discharge home, recommend the following:   A lot of help with walking and/or transfers;A lot of help with bathing/dressing/bathroom;Direct supervision/assist for medications management;Supervision due to cognitive status;Direct supervision/assist for financial management;Assist for transportation;Assistance with cooking/housework;Help with stairs or ramp for entrance     Functional Status Assessment   Patient has had a recent decline in their  functional status and demonstrates the ability to make significant improvements in function in a reasonable and predictable amount of time.     Equipment Recommendations   Other (comment) (defer)     Recommendations for Other Services         Precautions/Restrictions   Precautions Precautions: Fall Recall of Precautions/Restrictions: Impaired Restrictions Weight Bearing Restrictions Per Provider Order: No     Mobility Bed Mobility               General bed mobility comments: NT, in recliner pre and post session    Transfers Overall transfer level: Needs assistance Equipment used: Rolling walker (2 wheels) Transfers: Sit to/from Stand Sit to Stand: Mod assist, Min assist           General transfer comment: VC for hand placement, VC sequencing      Balance Overall balance assessment: Needs assistance Sitting-balance support: Feet supported, Bilateral upper extremity supported Sitting balance-Leahy Scale: Good     Standing balance support: Bilateral upper extremity supported, Reliant on assistive device for balance Standing balance-Leahy Scale: Fair                             ADL either performed or assessed with clinical judgement   ADL Overall ADL's : Needs assistance/impaired                                       General ADL Comments: Pt requires MAX A for LB ADL tasks, MIN-MOD A for ADL transfers requiring BUE Support on the RW at all times. Per wife, this is an increased assist level from baseline     Vision  Perception         Praxis         Pertinent Vitals/Pain Pain Assessment Pain Assessment: Faces Faces Pain Scale: Hurts a little bit Pain Location: L foot (corn) Pain Descriptors / Indicators: Aching Pain Intervention(s): Limited activity within patient's tolerance, Monitored during session, Repositioned     Extremity/Trunk Assessment Upper Extremity Assessment Upper Extremity  Assessment: Overall WFL for tasks assessed   Lower Extremity Assessment Lower Extremity Assessment: Overall WFL for tasks assessed   Cervical / Trunk Assessment Cervical / Trunk Assessment: Normal   Communication Communication Communication: No apparent difficulties   Cognition Arousal: Alert Behavior During Therapy: WFL for tasks assessed/performed Cognition: History of cognitive impairments                               Following commands: Impaired Following commands impaired: Follows one step commands with increased time, Follows one step commands inconsistently     Cueing  General Comments   Cueing Techniques: Verbal cues;Tactile cues  orthostatic   Exercises Other Exercises Other Exercises: Pt/spouse educated in role of acute OT, recs   Shoulder Instructions      Home Living Family/patient expects to be discharged to:: Private residence Living Arrangements: Spouse/significant other Available Help at Discharge: Family Type of Home: House Home Access: Ramped entrance     Home Layout: One level     Bathroom Shower/Tub: Walk-in shower;Sponge bathes at baseline         Home Equipment: Agricultural consultant (2 wheels);Cane - single Tax inspector (4 wheels)          Prior Functioning/Environment Prior Level of Function : Needs assist;History of Falls (last six months)             Mobility Comments: Pt uses transport chair at baseline. Pt's family assists with transfers without use of other AD. Pt's spouse reports working with [facility] PT in the past but has not seen progress d/t pt intermittent illness. Pt's spouse reports multiple falls in the past 6 months d/t LOB ADLs Comments: Pt requires assistance with ADLs. Pt's spouse reports spongebathing at baseline.    OT Problem List: Pain;Cardiopulmonary status limiting activity;Decreased cognition;Decreased safety awareness;Decreased activity tolerance;Impaired balance (sitting and/or  standing);Decreased knowledge of use of DME or AE;Decreased knowledge of precautions   OT Treatment/Interventions: Self-care/ADL training;Therapeutic exercise;Therapeutic activities;Cognitive remediation/compensation;Energy conservation;DME and/or AE instruction;Patient/family education;Balance training      OT Goals(Current goals can be found in the care plan section)   Acute Rehab OT Goals Patient Stated Goal: get better OT Goal Formulation: With patient/family Time For Goal Achievement: 01/06/24 Potential to Achieve Goals: Good ADL Goals Pt Will Perform Lower Body Dressing: sit to/from stand;with caregiver independent in assisting;with adaptive equipment Pt Will Transfer to Toilet: stand pivot transfer;bedside commode;with min assist Pt Will Perform Toileting - Clothing Manipulation and hygiene: sit to/from stand;sitting/lateral leans;with caregiver independent in assisting Additional ADL Goal #1: Pt/caregiver will complete sponge bath, primarily in sitting with mod indep, 1/1 opportunity.   OT Frequency:  Min 1X/week    Co-evaluation              AM-PAC OT 6 Clicks Daily Activity     Outcome Measure Help from another person eating meals?: A Little Help from another person taking care of personal grooming?: A Little Help from another person toileting, which includes using toliet, bedpan, or urinal?: A Lot Help from another person bathing (including washing, rinsing, drying)?: A  Lot Help from another person to put on and taking off regular upper body clothing?: A Lot Help from another person to put on and taking off regular lower body clothing?: A Lot 6 Click Score: 14   End of Session Equipment Utilized During Treatment: Rolling walker (2 wheels)  Activity Tolerance: Patient tolerated treatment well Patient left: in chair;with call bell/phone within reach;with chair alarm set;with family/visitor present  OT Visit Diagnosis: Other abnormalities of gait and mobility  (R26.89);Other symptoms and signs involving cognitive function;Pain Pain - Right/Left: Left Pain - part of body: Ankle and joints of foot                Time: 8980-8953 OT Time Calculation (min): 27 min Charges:  OT General Charges $OT Visit: 1 Visit OT Evaluation $OT Eval Moderate Complexity: 1 Mod  Warren SAUNDERS., MPH, MS, OTR/L ascom [phone] 12/23/23, 1:43 PM

## 2023-12-24 DIAGNOSIS — R112 Nausea with vomiting, unspecified: Secondary | ICD-10-CM | POA: Diagnosis not present

## 2023-12-24 DIAGNOSIS — E440 Moderate protein-calorie malnutrition: Secondary | ICD-10-CM | POA: Insufficient documentation

## 2023-12-24 LAB — BASIC METABOLIC PANEL WITH GFR
Anion gap: 6 (ref 5–15)
BUN: 12 mg/dL (ref 8–23)
CO2: 26 mmol/L (ref 22–32)
Calcium: 9.2 mg/dL (ref 8.9–10.3)
Chloride: 105 mmol/L (ref 98–111)
Creatinine, Ser: 0.81 mg/dL (ref 0.61–1.24)
GFR, Estimated: >60 mL/min (ref >60)
Glucose, Bld: 142 mg/dL — ABNORMAL HIGH (ref 70–99)
Potassium: 4.6 mmol/L (ref 3.5–5.1)
Sodium: 137 mmol/L (ref 135–145)

## 2023-12-24 LAB — HEMOGLOBIN: Hemoglobin: 14.4 g/dL (ref 13.0–17.0)

## 2023-12-24 LAB — MAGNESIUM: Magnesium: 2.2 mg/dL (ref 1.7–2.4)

## 2023-12-24 NOTE — Progress Notes (Signed)
 Physical Therapy Treatment Patient Details Name: Mark [name]. MRN: [id number] DOB: [birth year] Today's Date: 12/24/2023   History of Present Illness [name], Mark [name]. is an 83 year old male with history of GERD, paroxysmal atrial fibrillation, on chronic anticoagulation with Eliquis , hyperlipidemia, factor V Leyden, who presents ED from PCP for chief concerns of intractable nausea and vomiting. MD diagnoses include: paroxysmal atrial fibrillation, GERD, hyperlipidemia, anxiety, and chronic anticoagulation.    PT Comments  Pt awake on arrival agreeable to PT session, family at bedside. Pt has known ST memory impairment which precludes useful subjective report, however he is not nauseated on my visit and he has no pain during session. Pt has no recollection of orthostatic BP issue previous day with PT. Pt is briefly hypotensive after transition to EOB, mild symptoms, but pressures correct and improve for remainder of assessments. Pt remains weak with transfers, but tolerates different activities at bedside that involve functional mobility tasks. Pt agreeable to sit EOB at end of session. Pt appears to be progressing back toward most recent baseline which includes significant weakness and nonambulatory status. Will continue to follow.    12/24/23 1340  Therapy Vitals  Patient Position (if appropriate) Orthostatic Vitals  Orthostatic Lying   BP- Lying 102/59  Pulse- Lying 67  Orthostatic Sitting  BP- Sitting 92/61 (115/58 @ 1 min seated)  Pulse- Sitting 92 (80 @ 1 min seated)  Orthostatic Standing at 3 minutes  BP- Standing at 3 minutes 121/60 (after prolonged standing and side stepping along EOB)  Pulse- Standing at 3 minutes 93      If plan is discharge home, recommend the following: A lot of help with bathing/dressing/bathroom;Supervision due to cognitive status;Assist for transportation;Direct supervision/assist for medications management;Two people to help with walking and/or  transfers   Can travel by private vehicle     No  Equipment Recommendations  None recommended by PT    Recommendations for Other Services       Precautions / Restrictions Precautions Precautions: Fall Recall of Precautions/Restrictions: Impaired Restrictions Weight Bearing Restrictions Per Provider Order: No     Mobility  Bed Mobility Overal bed mobility: Needs Assistance Bed Mobility: Supine to Sit     Supine to sit: Mod assist          Transfers Overall transfer level: Needs assistance Equipment used: Rolling walker (2 wheels) Transfers: Sit to/from Stand Sit to Stand: Mod assist   Step pivot transfers: Contact guard assist (does side stepping along EOB to center self with bedframe)            Ambulation/Gait Ambulation/Gait assistance:  (Pt has not been AMB in recent weeks due to balance issues adn weakness.)                 Stairs             Wheelchair Mobility     Tilt Bed    Modified Rankin (Stroke Patients Only)       Balance                                            Communication    Cognition                                        Cueing  Exercises Other Exercises Other Exercises: STS from EOB x5, elevation + minA Other Exercises: seated EOB x20 minutes, education on orthostatic hypotension and pt safety Other Exercises: Rt side stepping with RW, minGuard A    General Comments        Pertinent Vitals/Pain Pain Assessment Pain Assessment: No/denies pain    Home Living                          Prior Function            PT Goals (current goals can now be found in the care plan section) Acute Rehab PT Goals Patient Stated Goal: maintain functional mobility PT Goal Formulation: With family Time For Goal Achievement: 01/06/24 Potential to Achieve Goals: Good Progress towards PT goals: Progressing toward goals    Frequency    Min 2X/week      PT  Plan      Co-evaluation              AM-PAC PT 6 Clicks Mobility   Outcome Measure  Help needed turning from your back to your side while in a flat bed without using bedrails?: A Lot Help needed moving from lying on your back to sitting on the side of a flat bed without using bedrails?: A Lot Help needed moving to and from a bed to a chair (including a wheelchair)?: A Lot Help needed standing up from a chair using your arms (e.g., wheelchair or bedside chair)?: A Lot Help needed to walk in hospital room?: A Lot Help needed climbing 3-5 steps with a railing? : A Lot 6 Click Score: 12    End of Session   Activity Tolerance: Patient tolerated treatment well;No increased pain Patient left: with call bell/phone within reach;with family/visitor present;in bed Nurse Communication: Mobility status PT Visit Diagnosis: Unsteadiness on feet (R26.81);Repeated falls (R29.6);Difficulty in walking, not elsewhere classified (R26.2);Dizziness and giddiness (R42)     Time: 8671-8649 PT Time Calculation (min) (ACUTE ONLY): 22 min  Charges:    $Therapeutic Activity: 8-22 mins PT General Charges $$ ACUTE PT VISIT: 1 Visit                    2:04 PM, 12/24/23 [name], PT, DPT Physical Therapist - [facility]  [phone] (ASCOM)     Makaylah Oddo C 12/24/2023, 2:00 PM

## 2023-12-24 NOTE — Progress Notes (Signed)
 The patient was seen for nausea vomiting with no GI source seen and had negative EGD.  It is felt the patient may have a central cause of the nausea vomiting.  Nothing further to do from GI point of view.  I will sign off.  Please call if any further GI concerns or questions.  We would like to thank you for the opportunity to participate in the care of Mark [name].SABRA

## 2023-12-24 NOTE — Progress Notes (Signed)
 PROGRESS NOTE    Mark [name].  FMW:[id number] DOB: 1940/06/11 DOA: 12/21/2023 PCP: Sadie Manna, MD  Chief Complaint  Patient presents with   Emesis    Hospital Course:  Mark [name], [name]. is an 83 year old male with history of GERD, paroxysmal atrial fibrillation, on chronic anticoagulation with Eliquis , hyperlipidemia, factor V Leyden, who presents ED from PCP for chief concerns of intractable nausea and vomiting. Presented with nausea, vomiting for the last 5 to 6 weeks, which reportedly worsened for 1 week. Admitted for further management  Subjective: Patient was examined at the bedside, denies any symptoms.  Patient and wife report that he had no further symptoms today.  Continue Meclizine  and Prednisone trial Pending discharge to SNF   Objective: Vitals:   12/23/23 2125 12/24/23 0412 12/24/23 0758 12/24/23 1603  BP: 111/72 127/63 120/60 (!) 105/55  Pulse: 74 84 78 87  Resp: 19 17 17 17   Temp: 97.7 F (36.5 C) 97.8 F (36.6 C) 98.6 F (37 C) (!) 97.4 F (36.3 C)  TempSrc:      SpO2: 96% 94% 96% 94%  Weight:      Height:        Intake/Output Summary (Last 24 hours) at 12/24/2023 1624 Last data filed at 12/24/2023 1300 Gross per 24 hour  Intake 720 ml  Output 400 ml  Net 320 ml   Filed Weights   12/21/23 1445  Weight: 94.8 kg    Examination: Constitutional: appears age-appropriate, frail, NAD, calm, horizontal nystagmus Neck: normal, supple, no masses, no thyromegaly Respiratory: clear to auscultation bilaterally, no wheezing, no crackles. Normal respiratory effort. No accessory muscle use.  Cardiovascular: Regular rate and rhythm, no murmurs / rubs / gallops. No extremity edema Abdomen: no tenderness, no masses palpated, no hepatosplenomegaly Skin: no rashes, lesions, ulcers. No induration Neurologic: Sensation intact. Strength 5/5 in all 4.   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 Denies LOC, headaches, palpitations, new meds - Orthostatic vitals negative in sitting and lying position - EGD done 10/01, no identifiable cause for his symptoms, Seen by GI, appreciate recs - MRI brain 08/25 no acute abnormality - Symptoms better with meclizine  trial - Discussed with Dr.Vaught, ENT - trial of prednisone taper 60mg  over 6 days - Tolerating regular diet, encourage small portions multiple times of the day - Ondansetron  prn, Compazine prn  Hypokalemia - Resolved - monitor and replete as needed   PAF (paroxysmal atrial fibrillation) (HCC) - Eliquis    Hyperlipidemia Simvastatin  40 mg daily   GERD (gastroesophageal reflux disease) PPI   Recurrent UTI - Continue Macrobid  prophylaxis   - PT/OT eval - rec SNF, may benefit from vestibular rehab  DVT prophylaxis: Eliquis    Code Status: Full Code Disposition:  TBD  Consultants:  Treatment Team:  Consulting Physician: Unk Corinn Skiff, MD  Procedures:  EGD  Antimicrobials:  Anti-infectives (From admission, onward)    Start     Dose/Rate Route Frequency Ordered Stop   12/22/23 2200  nitrofurantoin  (macrocrystal-monohydrate) (MACROBID ) capsule 100 mg        100 mg Oral Daily at 10 pm 12/22/23 1623         Data Reviewed: I have personally reviewed following labs and imaging studies CBC: Recent Labs  Lab 12/21/23 1448 12/22/23 0537 12/23/23 0224 12/24/23 0418  WBC 7.4 7.2 5.4  --   HGB 16.1 14.2 12.9* 14.4  HCT 49.1 42.5 39.1  --   MCV 94.1 92.6 93.3  --   PLT 259 226 197  --    Basic Metabolic Panel: Recent Labs  Lab 12/21/23 1448 12/22/23 0537 12/23/23 0224 12/24/23 0418  NA 138 142 137 137  K 3.9 3.9 3.2* 4.6  CL 103 106 106 105  CO2 24 27 23 26   GLUCOSE  114* 88 85 142*  BUN 19 17 12 12   CREATININE 0.90 0.87 0.78 0.81  CALCIUM 9.4 8.8* 8.5* 9.2  MG  --   --  2.1 2.2   GFR: Estimated Creatinine Clearance: 83.3 mL/min (by C-G formula based on SCr of 0.81 mg/dL). Liver Function Tests: Recent Labs  Lab 12/21/23 1448  AST 23  ALT 21  ALKPHOS 68  BILITOT 1.1  PROT 7.4  ALBUMIN 3.8   CBG: No results for input(s): GLUCAP in the last 168 hours.  No results found for this or any previous visit (from the past 240 hours).   Radiology Studies: No results found.   Scheduled Meds:  apixaban   2.5 mg Oral Q12H   buPROPion   75 mg Oral Q breakfast   feeding supplement  237 mL Oral BID BM   finasteride   5 mg Oral Daily   FLUoxetine   80 mg Oral Daily   meclizine   25 mg Oral TID   multivitamin with minerals  1 tablet Oral Daily   nitrofurantoin  (macrocrystal-monohydrate)  100 mg Oral Q2200   pantoprazole   40 mg Oral Daily   [START ON 12/28/2023] predniSONE  10 mg Oral Once   [START ON 12/27/2023] predniSONE  20 mg Oral Q breakfast   [START ON 12/26/2023] predniSONE  30 mg Oral Once   [START ON 12/25/2023] predniSONE  40 mg Oral Once   simvastatin   40 mg Oral Daily   tamsulosin   0.4 mg Oral Daily   Continuous Infusions:     LOS: 3 days  MDM: Patient is high risk for one or more organ failure.  They necessitate ongoing hospitalization for continued IV therapies and subsequent lab monitoring. Total time spent interpreting labs and vitals, reviewing the medical record, coordinating care amongst consultants and care team members, directly assessing and discussing care with the patient and/or family: 35 min Laree Lock, MD [facility]  To contact the attending physician between 7A-7P please use Epic Chat. To contact the covering physician during after hours 7P-7A, please review Amion.  12/24/2023, 4:24 PM   *This document has been created with the assistance of dictation software. Please excuse typographical errors. *

## 2023-12-24 NOTE — Plan of Care (Signed)

## 2023-12-24 NOTE — Plan of Care (Signed)
  Problem: Education: Goal: Knowledge of General Education information will improve Description: Including pain rating scale, medication(s)/side effects and non-pharmacologic comfort measures Outcome: Progressing   Problem: Health Behavior/Discharge Planning: Goal: Ability to manage health-related needs will improve Outcome: Progressing   Problem: Clinical Measurements: Goal: Ability to maintain clinical measurements within normal limits will improve Outcome: Progressing Goal: Will remain free from infection Outcome: Progressing   Problem: Activity: Goal: Risk for activity intolerance will decrease Outcome: Progressing   Problem: Coping: Goal: Level of anxiety will decrease Outcome: Progressing   Problem: Pain Managment: Goal: General experience of comfort will improve and/or be controlled Outcome: Progressing   Problem: Safety: Goal: Ability to remain free from injury will improve Outcome: Progressing   Problem: Skin Integrity: Goal: Risk for impaired skin integrity will decrease Outcome: Progressing

## 2023-12-24 NOTE — Care Management Important Message (Signed)
 Important Message  Patient Details  Name: Mark [name]. MRN: [id number] Date of Birth: July 18, 1940   Important Message Given:  Yes - Medicare IM     Valene Villa W, CMA 12/24/2023, 11:35 AM

## 2023-12-25 DIAGNOSIS — R112 Nausea with vomiting, unspecified: Secondary | ICD-10-CM | POA: Diagnosis not present

## 2023-12-25 LAB — BASIC METABOLIC PANEL WITH GFR
Anion gap: 11 (ref 5–15)
BUN: 18 mg/dL (ref 8–23)
CO2: 23 mmol/L (ref 22–32)
Calcium: 8.8 mg/dL — ABNORMAL LOW (ref 8.9–10.3)
Chloride: 103 mmol/L (ref 98–111)
Creatinine, Ser: 0.81 mg/dL (ref 0.61–1.24)
GFR, Estimated: >60 mL/min (ref >60)
Glucose, Bld: 125 mg/dL — ABNORMAL HIGH (ref 70–99)
Potassium: 4 mmol/L (ref 3.5–5.1)
Sodium: 137 mmol/L (ref 135–145)

## 2023-12-25 MED ORDER — POLYETHYLENE GLYCOL 3350 17 G PO PACK
17.0000 g | PACK | Freq: Every day | ORAL | Status: DC
  Administered 2023-12-25 – 2023-12-26 (×2): 17 g via ORAL
  Filled 2023-12-25 (×2): qty 1

## 2023-12-25 NOTE — Plan of Care (Signed)

## 2023-12-25 NOTE — Plan of Care (Signed)
   Problem: Education: Goal: Knowledge of General Education information will improve Description Including pain rating scale, medication(s)/side effects and non-pharmacologic comfort measures Outcome: Progressing   Problem: Health Behavior/Discharge Planning: Goal: Ability to manage health-related needs will improve Outcome: Progressing

## 2023-12-25 NOTE — Progress Notes (Signed)
 PROGRESS NOTE    Mark [name].  FMW:[id number] DOB: 29-Oct-1940 DOA: 12/21/2023 PCP: Sadie Manna, MD  Chief Complaint  Patient presents with   Emesis    Hospital Course:  Mr. Kule Gascoigne, [name]. is an 83 year old male with history of GERD, paroxysmal atrial fibrillation, on chronic anticoagulation with Eliquis , hyperlipidemia, factor V Leyden, who presents ED from PCP for chief concerns of intractable nausea and vomiting. Presented with nausea, vomiting for the last 5 to 6 weeks, which reportedly worsened for 1 week. Admitted for further management  Subjective: Patient was examined at the bedside, denies any symptoms.  Patient and wife report that he had no further symptoms today.  Pending discharge to SNF, TOC aware   Objective: Vitals:   12/24/23 1603 12/24/23 1924 12/25/23 0409 12/25/23 0811  BP: (!) 105/55 (!) 110/49 121/79 115/76  Pulse: 87 80 77 61  Resp: 17 18 15 15   Temp: (!) 97.4 F (36.3 C) 97.8 F (36.6 C) 98.6 F (37 C) 97.7 F (36.5 C)  TempSrc:      SpO2: 94% 95% 95% 97%  Weight:      Height:        Intake/Output Summary (Last 24 hours) at 12/25/2023 1412 Last data filed at 12/25/2023 0900 Gross per 24 hour  Intake 600 ml  Output 700 ml  Net -100 ml   Filed Weights   12/21/23 1445  Weight: 94.8 kg    Examination: Constitutional: appears age-appropriate, frail, NAD, calm, horizontal nystagmus Neck: normal, supple, no masses, no thyromegaly Respiratory: clear to auscultation bilaterally, no wheezing, no crackles. Normal respiratory effort. No accessory muscle use.  Cardiovascular: Regular rate and rhythm, no murmurs / rubs / gallops. No extremity edema Abdomen: no tenderness, no masses palpated, no hepatosplenomegaly Skin: no rashes, lesions, ulcers. No induration Neurologic: Sensation intact. Strength 5/5 in all 4.   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 Denies LOC, headaches, palpitations, new meds - Orthostatic vitals negative in sitting and lying position - EGD done 10/01, no identifiable cause for his symptoms, Seen by GI, appreciate recs - MRI brain 08/25 no acute abnormality - Symptoms better with meclizine  trial - Discussed with Dr.Vaught, ENT - trial of prednisone taper 60mg  over 6 days - Tolerating regular diet, encourage small portions multiple times of the day - Ondansetron  prn, Compazine prn  Hypokalemia - Resolved - monitor and replete as needed   PAF (paroxysmal atrial fibrillation) (HCC) - Eliquis    Hyperlipidemia Simvastatin  40 mg daily   GERD (gastroesophageal reflux disease) PPI   Recurrent UTI - Continue Macrobid  prophylaxis  Constipation - add bowel regimen   - PT/OT eval - rec SNF, may benefit from vestibular rehab  DVT prophylaxis: Eliquis    Code Status: Full Code Disposition:  TBD  Consultants:  Treatment Team:  Consulting Physician: Unk Corinn Skiff, MD  Procedures:  EGD  Antimicrobials:  Anti-infectives (From admission, onward)    Start     Dose/Rate Route Frequency Ordered Stop   12/22/23 2200  nitrofurantoin  (macrocrystal-monohydrate) (MACROBID ) capsule 100 mg        100 mg Oral Daily at 10 pm 12/22/23 1623         Data Reviewed: I have personally reviewed following labs and imaging studies CBC: Recent Labs  Lab 12/21/23 1448 12/22/23 0537 12/23/23 0224 12/24/23 0418  WBC 7.4 7.2 5.4  --   HGB 16.1 14.2 12.9* 14.4  HCT 49.1 42.5 39.1  --   MCV 94.1 92.6 93.3  --   PLT 259 226 197  --    Basic Metabolic Panel: Recent Labs  Lab 12/21/23 1448 12/22/23 0537 12/23/23 0224 12/24/23 0418 12/25/23 0320  NA 138 142 137 137 137  K 3.9 3.9 3.2* 4.6 4.0  CL 103 106 106 105 103   CO2 24 27 23 26 23   GLUCOSE 114* 88 85 142* 125*  BUN 19 17 12 12 18   CREATININE 0.90 0.87 0.78 0.81 0.81  CALCIUM 9.4 8.8* 8.5* 9.2 8.8*  MG  --   --  2.1 2.2  --    GFR: Estimated Creatinine Clearance: 83.3 mL/min (by C-G formula based on SCr of 0.81 mg/dL). Liver Function Tests: Recent Labs  Lab 12/21/23 1448  AST 23  ALT 21  ALKPHOS 68  BILITOT 1.1  PROT 7.4  ALBUMIN 3.8   CBG: No results for input(s): GLUCAP in the last 168 hours.  No results found for this or any previous visit (from the past 240 hours).   Radiology Studies: No results found.   Scheduled Meds:  apixaban   2.5 mg Oral Q12H   buPROPion   75 mg Oral Q breakfast   feeding supplement  237 mL Oral BID BM   finasteride   5 mg Oral Daily   FLUoxetine   80 mg Oral Daily   meclizine   25 mg Oral TID   multivitamin with minerals  1 tablet Oral Daily   nitrofurantoin  (macrocrystal-monohydrate)  100 mg Oral Q2200   pantoprazole   40 mg Oral Daily   polyethylene glycol  17 g Oral Daily   [START ON 12/28/2023] predniSONE  10 mg Oral Once   [START ON 12/27/2023] predniSONE  20 mg Oral Q breakfast   [START ON 12/26/2023] predniSONE  30 mg Oral Once   simvastatin   40 mg Oral Daily   tamsulosin   0.4 mg Oral Daily   Continuous Infusions:     LOS: 4 days  MDM: Patient is high risk for one or more organ failure.  They necessitate ongoing hospitalization for continued IV therapies and subsequent lab monitoring. Total time spent interpreting labs and vitals, reviewing the medical record, coordinating care amongst consultants and care team members, directly assessing and discussing care with the patient and/or family: 35 min Laree Lock, MD [facility]  To contact the attending physician between 7A-7P please use Epic Chat. To contact the covering physician during after hours 7P-7A, please review Amion.  12/25/2023, 2:12 PM   *This document has been created with the assistance of dictation software. Please  excuse typographical errors. *

## 2023-12-26 DIAGNOSIS — R112 Nausea with vomiting, unspecified: Secondary | ICD-10-CM | POA: Diagnosis not present

## 2023-12-26 MED ORDER — POLYETHYLENE GLYCOL 3350 17 G PO PACK: 17.0000 g | PACK | Freq: Every day | ORAL | Status: DC | PRN

## 2023-12-26 NOTE — Plan of Care (Signed)

## 2023-12-26 NOTE — Plan of Care (Signed)
  Problem: Education: Goal: Knowledge of General Education information will improve Description: Including pain rating scale, medication(s)/side effects and non-pharmacologic comfort measures 12/26/2023 1401 by Teressa Nest, RN Outcome: Progressing 12/26/2023 1400 by Teressa Nest, RN Outcome: Progressing   Problem: Health Behavior/Discharge Planning: Goal: Ability to manage health-related needs will improve 12/26/2023 1401 by Teressa Nest, RN Outcome: Progressing 12/26/2023 1400 by Teressa Nest, RN Outcome: Progressing   Problem: Clinical Measurements: Goal: Ability to maintain clinical measurements within normal limits will improve 12/26/2023 1401 by Teressa Nest, RN Outcome: Progressing 12/26/2023 1400 by Teressa Nest, RN Outcome: Progressing Goal: Will remain free from infection 12/26/2023 1401 by Teressa Nest, RN Outcome: Progressing 12/26/2023 1400 by Teressa Nest, RN Outcome: Progressing Goal: Diagnostic test results will improve 12/26/2023 1401 by Teressa Nest, RN Outcome: Completed/Met 12/26/2023 1400 by Teressa Nest, RN Outcome: Progressing Goal: Respiratory complications will improve 12/26/2023 1401 by Teressa Nest, RN Outcome: Completed/Met 12/26/2023 1400 by Teressa Nest, RN Outcome: Progressing Goal: Cardiovascular complication will be avoided 12/26/2023 1401 by Teressa Nest, RN Outcome: Progressing 12/26/2023 1400 by Teressa Nest, RN Outcome: Progressing   Problem: Activity: Goal: Risk for activity intolerance will decrease 12/26/2023 1401 by Teressa Nest, RN Outcome: Not Progressing 12/26/2023 1400 by Teressa Nest, RN Outcome: Progressing   Problem: Nutrition: Goal: Adequate nutrition will be maintained 12/26/2023 1401 by Teressa Nest, RN Outcome: Progressing 12/26/2023 1400 by Teressa Nest, RN Outcome: Progressing   Problem: Coping: Goal: Level of anxiety will decrease 12/26/2023 1401 by  Teressa Nest, RN Outcome: Completed/Met 12/26/2023 1400 by Teressa Nest, RN Outcome: Progressing   Problem: Elimination: Goal: Will not experience complications related to bowel motility 12/26/2023 1401 by Teressa Nest, RN Outcome: Progressing 12/26/2023 1400 by Teressa Nest, RN Outcome: Progressing Goal: Will not experience complications related to urinary retention 12/26/2023 1401 by Teressa Nest, RN Outcome: Completed/Met 12/26/2023 1400 by Teressa Nest, RN Outcome: Progressing   Problem: Pain Managment: Goal: General experience of comfort will improve and/or be controlled 12/26/2023 1401 by Teressa Nest, RN Outcome: Progressing 12/26/2023 1400 by Teressa Nest, RN Outcome: Progressing   Problem: Safety: Goal: Ability to remain free from injury will improve 12/26/2023 1401 by Teressa Nest, RN Outcome: Progressing 12/26/2023 1400 by Teressa Nest, RN Outcome: Progressing   Problem: Skin Integrity: Goal: Risk for impaired skin integrity will decrease 12/26/2023 1401 by Teressa Nest, RN Outcome: Progressing 12/26/2023 1400 by Teressa Nest, RN Outcome: Progressing

## 2023-12-26 NOTE — Progress Notes (Signed)
 PROGRESS NOTE    Mark [name].  FMW:[id number] DOB: September 24, 1940 DOA: 12/21/2023 PCP: Sadie Manna, MD  Chief Complaint  Patient presents with   Emesis    Hospital Course:  [name], [name]. is an 83 year old male with history of GERD, paroxysmal atrial fibrillation, on chronic anticoagulation with Eliquis , hyperlipidemia, factor V Leyden, who presents ED from PCP for chief concerns of intractable nausea and vomiting. Presented with nausea, vomiting for the last 5 to 6 weeks, which reportedly worsened for 1 week. Admitted for further management  Subjective: Patient was examined at the bedside, denies any symptoms.  Patient and wife report that he had no further symptoms today.  Pending discharge to SNF, TOC aware   Objective: Vitals:   12/25/23 1529 12/25/23 1923 12/26/23 0402 12/26/23 0715  BP: (!) 111/58 (!) 107/54 (!) 141/67 (!) 119/58  Pulse: 70 72 82 74  Resp: 17 18 18 19   Temp: (!) 97.4 F (36.3 C) 98.2 F (36.8 C) 98 F (36.7 C) 97.9 F (36.6 C)  TempSrc:  Oral Oral   SpO2: 98% 97% 97% 96%  Weight:      Height:        Intake/Output Summary (Last 24 hours) at 12/26/2023 1430 Last data filed at 12/26/2023 0900 Gross per 24 hour  Intake 601 ml  Output 800 ml  Net -199 ml   Filed Weights   12/21/23 1445  Weight: 94.8 kg    Examination: Constitutional: appears age-appropriate, frail, NAD, calm, horizontal nystagmus Neck: normal, supple, no masses, no thyromegaly Respiratory: clear to auscultation bilaterally, no wheezing, no crackles. Normal respiratory effort. No accessory muscle use.  Cardiovascular: Regular rate and rhythm, no murmurs / rubs / gallops. No extremity edema Abdomen: no tenderness, no masses palpated, no hepatosplenomegaly Skin: no rashes, lesions, ulcers. No induration Neurologic: Sensation intact. Strength 5/5 in all 4.   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 Denies LOC, headaches, palpitations, new meds - Orthostatic vitals negative in sitting and lying position - EGD done 10/01, no identifiable cause for his symptoms, Seen by GI, appreciate recs - MRI brain 08/25 no acute abnormality - Symptoms better with meclizine  trial - Discussed with Dr.Vaught, ENT - trial of prednisone taper 60mg  over 6 days - Tolerating regular diet, encourage small portions multiple times of the day - Ondansetron  prn, Compazine prn  Hypokalemia - Resolved - monitor and replete as needed   PAF (paroxysmal atrial fibrillation) (HCC) - Eliquis    Hyperlipidemia Simvastatin  40 mg daily   GERD (gastroesophageal reflux disease) PPI   Recurrent UTI - Continue Macrobid  prophylaxis  Constipation - bowel regimen   - PT/OT eval - rec SNF, may benefit from vestibular rehab  DVT prophylaxis: Eliquis    Code Status: Full Code Disposition:  SNF - pending placement  Consultants:  Treatment Team:  Consulting Physician: Unk Corinn Skiff, MD  Procedures:  EGD  Antimicrobials:  Anti-infectives (From admission, onward)    Start     Dose/Rate Route Frequency Ordered Stop   12/22/23 2200  nitrofurantoin  (macrocrystal-monohydrate) (MACROBID ) capsule 100 mg        100 mg Oral Daily at 10 pm 12/22/23 1623         Data Reviewed: I have personally reviewed following labs and imaging studies CBC: Recent Labs  Lab 12/21/23 1448 12/22/23 0537 12/23/23 0224 12/24/23 9581  WBC 7.4 7.2 5.4  --   HGB 16.1 14.2 12.9* 14.4  HCT 49.1 42.5 39.1  --   MCV 94.1 92.6 93.3  --   PLT 259 226 197  --    Basic Metabolic Panel: Recent Labs  Lab 12/21/23 1448 12/22/23 0537 12/23/23 0224 12/24/23 0418 12/25/23 0320  NA 138 142 137 137 137  K 3.9 3.9 3.2* 4.6 4.0  CL 103 106 106 105 103  CO2 24 27 23 26 23   GLUCOSE 114* 88 85 142* 125*   BUN 19 17 12 12 18   CREATININE 0.90 0.87 0.78 0.81 0.81  CALCIUM 9.4 8.8* 8.5* 9.2 8.8*  MG  --   --  2.1 2.2  --    GFR: Estimated Creatinine Clearance: 83.3 mL/min (by C-G formula based on SCr of 0.81 mg/dL). Liver Function Tests: Recent Labs  Lab 12/21/23 1448  AST 23  ALT 21  ALKPHOS 68  BILITOT 1.1  PROT 7.4  ALBUMIN 3.8   CBG: No results for input(s): GLUCAP in the last 168 hours.  No results found for this or any previous visit (from the past 240 hours).   Radiology Studies: No results found.   Scheduled Meds:  apixaban   2.5 mg Oral Q12H   buPROPion   75 mg Oral Q breakfast   feeding supplement  237 mL Oral BID BM   finasteride   5 mg Oral Daily   FLUoxetine   80 mg Oral Daily   meclizine   25 mg Oral TID   multivitamin with minerals  1 tablet Oral Daily   nitrofurantoin  (macrocrystal-monohydrate)  100 mg Oral Q2200   pantoprazole   40 mg Oral Daily   polyethylene glycol  17 g Oral Daily   [START ON 12/28/2023] predniSONE  10 mg Oral Once   [START ON 12/27/2023] predniSONE  20 mg Oral Q breakfast   simvastatin   40 mg Oral Daily   tamsulosin   0.4 mg Oral Daily   Continuous Infusions:     LOS: 5 days  MDM: Patient is high risk for one or more organ failure.  They necessitate ongoing hospitalization for continued IV therapies and subsequent lab monitoring. Total time spent interpreting labs and vitals, reviewing the medical record, coordinating care amongst consultants and care team members, directly assessing and discussing care with the patient and/or family: 35 min Laree Lock, MD [facility]  To contact the attending physician between 7A-7P please use Epic Chat. To contact the covering physician during after hours 7P-7A, please review Amion.  12/26/2023, 2:30 PM   *This document has been created with the assistance of dictation software. Please excuse typographical errors. *

## 2023-12-27 ENCOUNTER — Encounter: Payer: Self-pay | Admitting: Gastroenterology

## 2023-12-27 DIAGNOSIS — R112 Nausea with vomiting, unspecified: Secondary | ICD-10-CM | POA: Diagnosis not present

## 2023-12-27 NOTE — Progress Notes (Signed)
 PROGRESS NOTE    Mark [name].  FMW:[id number] DOB: 1940/10/04 DOA: 12/21/2023 PCP: Sadie Manna, MD  Chief Complaint  Patient presents with   Emesis    Hospital Course:  Mark [name], [name]. is an 83 year old male with history of GERD, paroxysmal atrial fibrillation, on chronic anticoagulation with Eliquis , hyperlipidemia, factor V Leyden, who presents ED from PCP for chief concerns of intractable nausea and vomiting. Presented with nausea, vomiting for the last 5 to 6 weeks, which reportedly worsened for 1 week. Admitted for further management  Subjective: Patient was examined at the bedside, denies any symptoms today.  Wife reported patient had mild dizziness and 1 episode of emesis yesterday 10/05 Pending discharge to SNF, TOC aware   Objective: Vitals:   12/26/23 1603 12/26/23 1918 12/27/23 0350 12/27/23 0737  BP: (!) 107/58 117/60 104/61 121/67  Pulse: 79 77 65 69  Resp: 19 18 18 17   Temp: 97.8 F (36.6 C) 97.7 F (36.5 C) 97.7 F (36.5 C) 98.2 F (36.8 C)  TempSrc:      SpO2:  96% 97% 97%  Weight:      Height:        Intake/Output Summary (Last 24 hours) at 12/27/2023 1321 Last data filed at 12/27/2023 0300 Gross per 24 hour  Intake --  Output 900 ml  Net -900 ml   Filed Weights   12/21/23 1445  Weight: 94.8 kg    Examination: Constitutional: appears age-appropriate, frail, NAD, calm, horizontal nystagmus Neck: normal, supple, no masses, no thyromegaly Respiratory: clear to auscultation bilaterally, no wheezing, no crackles. Normal respiratory effort. No accessory muscle use.  Cardiovascular: Regular rate and rhythm, no murmurs / rubs / gallops. No extremity edema Abdomen: no tenderness, no masses palpated, no hepatosplenomegaly Skin: no rashes, lesions, ulcers. No induration Neurologic: Sensation intact. Strength 5/5 in all 4.   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 Denies LOC, headaches, palpitations, new meds - Orthostatic vitals negative in sitting and lying position - EGD done 10/01, no identifiable cause for his symptoms, Seen by GI, appreciate recs - MRI brain 08/25 no acute abnormality - Symptoms better with meclizine  trial - Discussed with Dr.Vaught, ENT - trial of prednisone taper 60mg  over 6 days - Tolerating regular diet, encourage small portions multiple times of the day - Ondansetron  prn, Compazine prn  Hypokalemia - Resolved - monitor and replete as needed   PAF (paroxysmal atrial fibrillation) (HCC) - Eliquis    Hyperlipidemia Simvastatin  40 mg daily   GERD (gastroesophageal reflux disease) PPI   Recurrent UTI - Continue Macrobid  prophylaxis  Constipation - bowel regimen   - PT/OT eval - rec SNF, may benefit from vestibular rehab  DVT prophylaxis: Eliquis    Code Status: Full Code Disposition:  SNF - pending placement  Consultants:  Treatment Team:  Consulting Physician: Unk Corinn Skiff, MD  Procedures:  EGD  Antimicrobials:  Anti-infectives (From admission, onward)    Start     Dose/Rate Route Frequency Ordered Stop   12/22/23 2200  nitrofurantoin  (macrocrystal-monohydrate) (MACROBID ) capsule 100 mg        100 mg Oral Daily at 10 pm 12/22/23 1623         Data Reviewed: I have personally reviewed following labs and imaging studies CBC: Recent Labs  Lab 12/21/23 1448 12/22/23 0537 12/23/23 0224 12/24/23 0418  WBC 7.4  7.2 5.4  --   HGB 16.1 14.2 12.9* 14.4  HCT 49.1 42.5 39.1  --   MCV 94.1 92.6 93.3  --   PLT 259 226 197  --    Basic Metabolic Panel: Recent Labs  Lab 12/21/23 1448 12/22/23 0537 12/23/23 0224 12/24/23 0418 12/25/23 0320  NA 138 142 137 137 137  K 3.9 3.9 3.2* 4.6 4.0  CL 103 106 106 105 103  CO2 24 27 23 26 23   GLUCOSE 114* 88 85 142* 125*  BUN 19  17 12 12 18   CREATININE 0.90 0.87 0.78 0.81 0.81  CALCIUM 9.4 8.8* 8.5* 9.2 8.8*  MG  --   --  2.1 2.2  --    GFR: Estimated Creatinine Clearance: 83.3 mL/min (by C-G formula based on SCr of 0.81 mg/dL). Liver Function Tests: Recent Labs  Lab 12/21/23 1448  AST 23  ALT 21  ALKPHOS 68  BILITOT 1.1  PROT 7.4  ALBUMIN 3.8   CBG: No results for input(s): GLUCAP in the last 168 hours.  No results found for this or any previous visit (from the past 240 hours).   Radiology Studies: No results found.   Scheduled Meds:  apixaban   2.5 mg Oral Q12H   buPROPion   75 mg Oral Q breakfast   feeding supplement  237 mL Oral BID BM   finasteride   5 mg Oral Daily   FLUoxetine   80 mg Oral Daily   meclizine   25 mg Oral TID   multivitamin with minerals  1 tablet Oral Daily   nitrofurantoin  (macrocrystal-monohydrate)  100 mg Oral Q2200   pantoprazole   40 mg Oral Daily   [START ON 12/28/2023] predniSONE  10 mg Oral Once   simvastatin   40 mg Oral Daily   tamsulosin   0.4 mg Oral Daily   Continuous Infusions:     LOS: 6 days  MDM: Patient is high risk for one or more organ failure.  They necessitate ongoing hospitalization for continued IV therapies and subsequent lab monitoring. Total time spent interpreting labs and vitals, reviewing the medical record, coordinating care amongst consultants and care team members, directly assessing and discussing care with the patient and/or family: 35 min Laree Lock, MD [facility]  To contact the attending physician between 7A-7P please use Epic Chat. To contact the covering physician during after hours 7P-7A, please review Amion.  12/27/2023, 1:21 PM   *This document has been created with the assistance of dictation software. Please excuse typographical errors. *

## 2023-12-27 NOTE — Plan of Care (Signed)
  Problem: Clinical Measurements: Goal: Will remain free from infection Outcome: Progressing Goal: Cardiovascular complication will be avoided Outcome: Progressing   Problem: Activity: Goal: Risk for activity intolerance will decrease Outcome: Progressing   Problem: Elimination: Goal: Will not experience complications related to bowel motility Outcome: Progressing   Problem: Pain Managment: Goal: General experience of comfort will improve and/or be controlled Outcome: Progressing

## 2023-12-27 NOTE — Evaluation (Signed)
 Physical Therapy Evaluation Patient Details Name: Mark [name]. MRN: [id number] DOB: June 15, 1940 Today's Date: 12/27/2023  History of Present Illness  [name], Mark [name]. is an 83 year old male with history of GERD, paroxysmal atrial fibrillation, on chronic anticoagulation with Eliquis , hyperlipidemia, factor V Leyden, who presents ED from PCP for chief concerns of intractable nausea and vomiting. MD diagnoses include: paroxysmal atrial fibrillation, GERD, hyperlipidemia, anxiety, and chronic anticoagulation.  Clinical Impression     The patient presents with symptoms consistent with vestibular dysfunction, characterized by reports of dizziness and nausea. On examination, oculomotor testing revealed normal findings. Positional testing Dix-Hallpike and Roll Test was positive for right posterior canal BPPV and positive for bilateral horizontal canals. Balance assessment demonstrated high level of instability secondary to significant baseline weakness and balance deficits at baseline. Romberg difficult to assess due to mobility limitations. Gait: unable to assess. Symptoms were provoked by cervical end range movement demonstrating likely contribution of cervicogenic dizziness. 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 The patient would benefit from skilled physical therapy interventions focused on vestibular rehabilitation, balance training, and compensatory strategies to improve overall mobility and safety and reduce dizziness during mobility. Recommend continued monitoring for symptom changes and coordination with the medical team for further diagnostic clarification if symptoms persist or worsen.  Screening questions: unilateral weakness/numbness, facial droop, slurred speech, or difficulty swallowing with episode, tinnitus, diplopia/visual field deficits aural fullness, ear pain, fluid coming from ears, worsening hearing. Spontaneous nystagmus?  Subjective history of  current problem: Describe incident of symptoms when changing positions although having little to no symptom at rest in bed. Pt states that symptoms worsen my movement is initiated for OOB mobility in the morning.  exacerbating factors: changing positions   Relieving factors: resting/ laying down   Symptom duration: worst and experience immediate vomiting and usually sick for the rest of the day  Symptom frequency: every other day which sometimes makes him afraid to due  Description of symptoms, description of dizziness: mostly just feels sick to my stomach   OCULOMOTOR / VESTIBULAR TESTING:  Oculomotor Exam- Room Light  Findings Comments  Ocular Alignment normal   Ocular ROM normal   Spontaneous Nystagmus normal   Gaze-Holding Nystagmus normal   End-Gaze Nystagmus normal   Vergence (normal 2-3) normal   Smooth Pursuit not examined   Saccades not examined    BPPV TESTS:  Symptoms Duration Intensity Nystagmus  Left Dix-Hallpike No dizziness  <1 min.  mild  none  Right Dix-Hallpike Dizziness  >1 min.  moderate Yes; Rt rotatory   Left Head Roll Dizziness  <1 min.  Mild none  Right Head Roll Dizziness  <1 min.  mild none      If plan is discharge home, recommend the following:     Can travel by private vehicle        Equipment Recommendations    Recommendations for Other Services       Functional Status Assessment       Precautions / Restrictions Precautions Precautions: Fall Recall of Precautions/Restrictions: Impaired Restrictions Weight Bearing Restrictions Per Provider Order: No      Mobility  Bed Mobility Overal bed mobility: Needs Assistance Bed Mobility: Supine to Sit     Supine to sit: Mod assist     General bed mobility comments: NT, in recliner pre and post session    Transfers Overall transfer level: Needs assistance Equipment used: Rolling walker (2 wheels) Transfers: Sit to/from Stand Sit to Stand: Mod assist  General  transfer comment: VC for hand placement, VC sequencing    Ambulation/Gait                  Stairs            Wheelchair Mobility     Tilt Bed    Modified Rankin (Stroke Patients Only)       Balance Overall balance assessment: Needs assistance Sitting-balance support: Feet supported, Bilateral upper extremity supported Sitting balance-Leahy Scale: Good Sitting balance - Comments: Able to maintain sitting balance at EOB. Pt reports dizziness with stand<>sit. Sypmtoms improve with prolonged sit.   Standing balance support: Bilateral upper extremity supported, Reliant on assistive device for balance Standing balance-Leahy Scale: Fair Standing balance comment: No LOB with standing. Pt reports symptoms however is able to tolerate prolonged stand.                             Pertinent Vitals/Pain Pain Assessment Faces Pain Scale: Hurts a little bit Pain Location: L foot (corn) Pain Descriptors / Indicators: Aching Pain Intervention(s): Limited activity within patient's tolerance, Monitored during session, Repositioned    Home Living Family/patient expects to be discharged to:: Private residence Living Arrangements: Spouse/significant other Available Help at Discharge: Family Type of Home: House Home Access: Ramped entrance       Home Layout: One level Home Equipment: Agricultural consultant (2 wheels);Cane - single Tax inspector (4 wheels)      Prior Function Prior Level of Function : Needs assist;History of Falls (last six months)             Mobility Comments: Pt uses transport chair at baseline. Pt's family assists with transfers without use of other AD. Pt's spouse reports working with [facility] PT in the past but has not seen progress d/t pt intermittent illness. Pt's spouse reports multiple falls in the past 6 months d/t LOB ADLs Comments: Pt requires assistance with ADLs. Pt's spouse reports spongebathing at baseline.      Extremity/Trunk Assessment        Lower Extremity Assessment Lower Extremity Assessment: Generalized weakness    Cervical / Trunk Assessment Cervical / Trunk Assessment: Normal  Communication        Cognition Arousal: Alert Behavior During Therapy: WFL for tasks assessed/performed   PT - Cognitive impairments: History of cognitive impairments                       PT - Cognition Comments: Pt has history of cognitive impairments. Pt's spouse is available to provide information about baseline. Pt unable to answer PLOF questions accurately. Pt is A&Ox3. Not oriented to situation.         Cueing       General Comments      Exercises     Assessment/Plan    PT Assessment Patient needs continued PT services  PT Problem List Decreased strength;Decreased activity tolerance;Decreased balance;Decreased mobility;Decreased cognition;Decreased safety awareness       PT Treatment Interventions DME instruction;Functional mobility training;Therapeutic activities;Therapeutic exercise;Balance training;Neuromuscular re-education;Patient/family education    PT Goals (Current goals can be found in the Care Plan section)  Acute Rehab PT Goals Patient Stated Goal: maintain functional mobility Time For Goal Achievement: 01/06/24 Potential to Achieve Goals: Good    Frequency Min 2X/week     Co-evaluation               AM-PAC PT 6 Clicks Mobility  Outcome  Measure Help needed turning from your back to your side while in a flat bed without using bedrails?: A Little Help needed moving from lying on your back to sitting on the side of a flat bed without using bedrails?: A Little Help needed moving to and from a bed to a chair (including a wheelchair)?: A Little Help needed standing up from a chair using your arms (e.g., wheelchair or bedside chair)?: A Lot Help needed to walk in hospital room?: A Lot Help needed climbing 3-5 steps with a railing? : A Lot 6 Click  Score: 15    End of Session Equipment Utilized During Treatment: Gait belt Activity Tolerance: Patient tolerated treatment well;No increased pain Patient left: with call bell/phone within reach;with family/visitor present;in bed Nurse Communication: Mobility status PT Visit Diagnosis: Unsteadiness on feet (R26.81);Repeated falls (R29.6);Difficulty in walking, not elsewhere classified (R26.2);Dizziness and giddiness (R42)    Time: 0230-0300 PT Time Calculation (min) (ACUTE ONLY): 30 min   Charges:   PT Evaluation $PT Eval Low Complexity: 1 Low   PT General Charges $$ ACUTE PT VISIT: 1 Visit         Sherlean Lesches DPT, PT    Anjeli Casad A Margurete Guaman 12/27/2023, 3:06 PM

## 2023-12-27 NOTE — Progress Notes (Signed)
 Occupational Therapy Treatment Patient Details Name: Mark [name]. MRN: [id number] DOB: Jan 20, 1941 Today's Date: 12/27/2023   History of present illness [name], Mark [name]. is an 83 year old male with history of GERD, paroxysmal atrial fibrillation, on chronic anticoagulation with Eliquis , hyperlipidemia, factor V Leyden, who presents ED from PCP for chief concerns of intractable nausea and vomiting. MD diagnoses include: paroxysmal atrial fibrillation, GERD, hyperlipidemia, anxiety, and chronic anticoagulation.   OT comments  Pt seen for OT tx. Spouse present and supportive. Spouse notes difficulties with bladder incontinence overnight while hospitalized. Pt/spouse educated in compensatory and adaptive strategies as well as products to help support improved bladder mgt. Spouse verbalized understanding and appreciation. Pt endorsed sacral pain, skin visualized and appeared red/purple in color. RN notified. Pillow placed under L hip and HOB lowered some to improve pt's comfort. RN and NT notified. Encouraged shifting pillow x2hrs and possible sacral foam patch placement. Pt/spouse educated in pressure relief with increased repositioning. Pt continues to benefit from skilled OT Services.       If plan is discharge home, recommend the following:  A lot of help with walking and/or transfers;A lot of help with bathing/dressing/bathroom;Direct supervision/assist for medications management;Supervision due to cognitive status;Direct supervision/assist for financial management;Assist for transportation;Assistance with cooking/housework;Help with stairs or ramp for entrance   Equipment Recommendations  Other (comment) (efer)    Recommendations for Other Services      Precautions / Restrictions Precautions Precautions: Fall Recall of Precautions/Restrictions: Impaired Restrictions Weight Bearing Restrictions Per Provider Order: No       Mobility Bed Mobility Overal bed mobility: Needs  Assistance Bed Mobility: Rolling Rolling: Mod assist, Used rails         General bed mobility comments: VC for initiation, bed rail use    Transfers                         Balance                                           ADL either performed or assessed with clinical judgement   ADL                                              Extremity/Trunk Assessment     Lower Extremity Assessment Lower Extremity Assessment: Generalized weakness   Cervical / Trunk Assessment Cervical / Trunk Assessment: Normal    Vision       Perception     Praxis     Communication Communication Communication: No apparent difficulties   Cognition Arousal: Alert Behavior During Therapy: WFL for tasks assessed/performed Cognition: History of cognitive impairments                               Following commands: Impaired Following commands impaired: Follows one step commands with increased time, Follows one step commands inconsistently      Cueing   Cueing Techniques: Verbal cues, Tactile cues  Exercises Other Exercises Other Exercises: Spouse expressed newer concern of bladder incontinence overnight. Pt/spouse educated in compensatory and adaptive strategies and products to support improved safety and management of bladder incontinence    Shoulder Instructions  General Comments pt endorsed sacral pain, skin visualized and appeared red/purple in color. RN notified. Pillow placed under L hip and HOB lowered some to improve pt's comfort. RN and NT notified. Encouraged shifting pillow x2hrs and possible sacral foam patch placement    Pertinent Vitals/ Pain       Pain Assessment Pain Assessment: Faces Faces Pain Scale: Hurts little more Pain Location: sacrum Pain Descriptors / Indicators: Aching, Grimacing Pain Intervention(s): Monitored during session, Repositioned, Other (comment) (RN notified)  Home Living  Family/patient expects to be discharged to:: Private residence Living Arrangements: Spouse/significant other Available Help at Discharge: Family Type of Home: House Home Access: Ramped entrance     Home Layout: One level     Bathroom Shower/Tub: Walk-in shower;Sponge bathes at baseline         Home Equipment: Agricultural consultant (2 wheels);Cane - single Tax inspector (4 wheels)          Prior Functioning/Environment              Frequency  Min 1X/week        Progress Toward Goals  OT Goals(current goals can now be found in the care plan section)  Progress towards OT goals: Progressing toward goals  Acute Rehab OT Goals Patient Stated Goal: get better OT Goal Formulation: With patient/family Time For Goal Achievement: 01/06/24 Potential to Achieve Goals: Good  Plan      Co-evaluation                 AM-PAC OT 6 Clicks Daily Activity     Outcome Measure   Help from another person eating meals?: A Little Help from another person taking care of personal grooming?: A Little Help from another person toileting, which includes using toliet, bedpan, or urinal?: A Lot Help from another person bathing (including washing, rinsing, drying)?: A Lot Help from another person to put on and taking off regular upper body clothing?: A Lot Help from another person to put on and taking off regular lower body clothing?: A Lot 6 Click Score: 14    End of Session    OT Visit Diagnosis: Other abnormalities of gait and mobility (R26.89);Other symptoms and signs involving cognitive function   Activity Tolerance Patient tolerated treatment well   Patient Left in bed;with call bell/phone within reach;with bed alarm set;with family/visitor present   Nurse Communication Other (comment) (sacrum discoloration and pain, pillow under L hip, sacral foam pad?)        Time: 8455-8443 OT Time Calculation (min): 12 min  Charges: OT General Charges $OT Visit:  1 Visit OT Treatments $Therapeutic Activity: 8-22 mins  Warren SAUNDERS., MPH, MS, OTR/L ascom [phone] 12/27/23, 4:11 PM

## 2023-12-27 NOTE — Plan of Care (Signed)

## 2023-12-28 DIAGNOSIS — R112 Nausea with vomiting, unspecified: Secondary | ICD-10-CM | POA: Diagnosis not present

## 2023-12-28 MED ORDER — MECLIZINE HCL 25 MG PO TABS
25.0000 mg | ORAL_TABLET | Freq: Three times a day (TID) | ORAL | Status: DC | PRN
  Administered 2023-12-28 – 2023-12-30 (×4): 25 mg via ORAL
  Filled 2023-12-28 (×5): qty 1

## 2023-12-28 NOTE — Consult Note (Signed)
 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 Wound type: Deep pressure injury on sacrum and L buttock. Pressure Injury POA: No (registered 10/06 - see flowsheet) Measurement: Wound bed: Drainage (amount, consistency, odor)  Periwound: Dressing procedure/placement/frequency: Cleanse with warm water, pat dry. Apply the silicon foam dressing, change every 3 days or PRN. Reassess every shift, pt can move with little help. Encourage him to change positions every 2 hours or less.    WOC team will follow weekly. Please reconsult if further assistance is needed. Thank-you,  Lela Holm RN, CNS, [employer], MSN.  (Phone [phone])

## 2023-12-28 NOTE — Plan of Care (Signed)
  Problem: Health Behavior/Discharge Planning: Goal: Ability to manage health-related needs will improve Outcome: Progressing   Problem: Clinical Measurements: Goal: Cardiovascular complication will be avoided Outcome: Not Progressing   Problem: Activity: Goal: Risk for activity intolerance will decrease Outcome: Not Progressing

## 2023-12-28 NOTE — Plan of Care (Signed)
   Problem: Activity: Goal: Risk for activity intolerance will decrease Outcome: Progressing   Problem: Nutrition: Goal: Adequate nutrition will be maintained Outcome: Progressing

## 2023-12-28 NOTE — Progress Notes (Signed)
 PROGRESS NOTE    Mark [name].  FMW:[id number] DOB: 03-20-1941 DOA: 12/21/2023 PCP: Mark Manna, MD  Chief Complaint  Patient presents with   Emesis    Hospital Course:  Mark [name], [name]. is an 83 year old male with history of GERD, paroxysmal atrial fibrillation, on chronic anticoagulation with Eliquis , hyperlipidemia, factor V Leyden, who presents ED from PCP for chief concerns of intractable nausea and vomiting. Presented with nausea, vomiting for the last 5 to 6 weeks, which reportedly worsened for 1 week. Admitted for further management  Subjective: Patient was examined at the bedside, denies any symptoms today Pending discharge to SNF, TOC aware   Objective: Vitals:   12/27/23 1500 12/27/23 2036 12/28/23 0402 12/28/23 1450  BP: (!) 99/48 112/60 (!) 111/53 118/65  Pulse: 65 71 (!) 58 79  Resp: 18 18 18 18   Temp: 98.3 F (36.8 C) 97.7 F (36.5 C) 97.7 F (36.5 C) 98.1 F (36.7 C)  TempSrc: Oral     SpO2: 95% 96% 96% 96%  Weight:      Height:        Intake/Output Summary (Last 24 hours) at 12/28/2023 1713 Last data filed at 12/28/2023 1016 Gross per 24 hour  Intake 480 ml  Output 1300 ml  Net -820 ml   Filed Weights   12/21/23 1445  Weight: 94.8 kg    Examination: Constitutional: appears age-appropriate, frail, NAD, calm, horizontal nystagmus Neck: normal, supple, no masses, no thyromegaly Respiratory: clear to auscultation bilaterally, no wheezing, no crackles. Normal respiratory effort. No accessory muscle use.  Cardiovascular: Regular rate and rhythm, no murmurs / rubs / gallops. No extremity edema Abdomen: no tenderness, no masses palpated, no hepatosplenomegaly Skin: no rashes, lesions, ulcers. No induration Neurologic: Sensation intact. Strength 5/5 in all 4.   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 Denies LOC, headaches, palpitations, new meds - Orthostatic vitals negative in sitting and lying position - EGD done 10/01, no identifiable cause for his symptoms, Seen by GI, appreciate recs - MRI brain 08/25 no acute abnormality - Symptoms better with meclizine  trial - Discussed with Dr.Vaught, ENT - trial of prednisone taper 60mg  over 6 days until 10/07, follow up outpatient - Tolerating regular diet, encourage small portions multiple times of the day - Ondansetron  prn, Compazine prn  Hypokalemia - Resolved - monitor and replete as needed   PAF (paroxysmal atrial fibrillation) (HCC) - Eliquis    Hyperlipidemia Simvastatin  40 mg daily   GERD (gastroesophageal reflux disease) PPI   Recurrent UTI - Continue Macrobid  prophylaxis  Constipation - bowel regimen   - PT/OT eval - rec SNF, may benefit from vestibular rehab  DVT prophylaxis: Eliquis    Code Status: Full Code Disposition:  SNF - pending placement  Consultants:  Treatment Team:  Consulting Physician: Unk Corinn Skiff, MD  Procedures:  EGD  Antimicrobials:  Anti-infectives (From admission, onward)    Start     Dose/Rate Route Frequency Ordered Stop   12/22/23 2200  nitrofurantoin  (macrocrystal-monohydrate) (MACROBID ) capsule 100 mg        100 mg Oral Daily at 10 pm 12/22/23 1623         Data Reviewed: I have personally reviewed following labs and imaging studies CBC: Recent Labs  Lab 12/22/23 0537 12/23/23 0224 12/24/23 0418  WBC 7.2 5.4  --   HGB 14.2 12.9*  14.4  HCT 42.5 39.1  --   MCV 92.6 93.3  --   PLT 226 197  --    Basic Metabolic Panel: Recent Labs  Lab 12/22/23 0537 12/23/23 0224 12/24/23 0418 12/25/23 0320  NA 142 137 137 137  K 3.9 3.2* 4.6 4.0  CL 106 106 105 103  CO2 27 23 26 23   GLUCOSE 88 85 142* 125*  BUN 17 12 12 18   CREATININE 0.87 0.78 0.81 0.81  CALCIUM 8.8* 8.5* 9.2 8.8*  MG  --  2.1 2.2  --     GFR: Estimated Creatinine Clearance: 83.3 mL/min (by C-G formula based on SCr of 0.81 mg/dL). Liver Function Tests: No results for input(s): AST, ALT, ALKPHOS, BILITOT, PROT, ALBUMIN in the last 168 hours.  CBG: No results for input(s): GLUCAP in the last 168 hours.  No results found for this or any previous visit (from the past 240 hours).   Radiology Studies: No results found.   Scheduled Meds:  apixaban   2.5 mg Oral Q12H   buPROPion   75 mg Oral Q breakfast   feeding supplement  237 mL Oral BID BM   finasteride   5 mg Oral Daily   FLUoxetine   80 mg Oral Daily   meclizine   25 mg Oral TID   multivitamin with minerals  1 tablet Oral Daily   nitrofurantoin  (macrocrystal-monohydrate)  100 mg Oral Q2200   pantoprazole   40 mg Oral Daily   simvastatin   40 mg Oral Daily   tamsulosin   0.4 mg Oral Daily   Continuous Infusions:     LOS: 7 days  MDM: Patient is high risk for one or more organ failure.  They necessitate ongoing hospitalization for continued IV therapies and subsequent lab monitoring. Total time spent interpreting labs and vitals, reviewing the medical record, coordinating care amongst consultants and care team members, directly assessing and discussing care with the patient and/or family: 35 min Laree Lock, MD [facility]  To contact the attending physician between 7A-7P please use Epic Chat. To contact the covering physician during after hours 7P-7A, please review Amion.  12/28/2023, 5:13 PM   *This document has been created with the assistance of dictation software. Please excuse typographical errors. *

## 2023-12-29 DIAGNOSIS — R112 Nausea with vomiting, unspecified: Secondary | ICD-10-CM | POA: Diagnosis not present

## 2023-12-29 NOTE — Plan of Care (Signed)
  Problem: Education: Goal: Knowledge of General Education information will improve Description: Including pain rating scale, medication(s)/side effects and non-pharmacologic comfort measures Outcome: Progressing   Problem: Health Behavior/Discharge Planning: Goal: Ability to manage health-related needs will improve Outcome: Progressing   Problem: Clinical Measurements: Goal: Ability to maintain clinical measurements within normal limits will improve Outcome: Progressing Goal: Will remain free from infection Outcome: Progressing Goal: Cardiovascular complication will be avoided Outcome: Progressing   Problem: Activity: Goal: Risk for activity intolerance will decrease Outcome: Progressing   Problem: Nutrition: Goal: Adequate nutrition will be maintained Outcome: Progressing   Problem: Elimination: Goal: Will not experience complications related to bowel motility Outcome: Progressing   Problem: Pain Managment: Goal: General experience of comfort will improve and/or be controlled Outcome: Progressing   Problem: Safety: Goal: Ability to remain free from injury will improve Outcome: Progressing   Problem: Skin Integrity: Goal: Risk for impaired skin integrity will decrease Outcome: Progressing

## 2023-12-29 NOTE — TOC Progression Note (Signed)
 Transition of Care (TOC) - Progression Note    Patient Details  Name: Mark [name]. MRN: [id number] Date of Birth: [birth year]  Transition of Care [facility]) CM/SW Contact  Marinda Cooks, RN Phone Number: 12/29/2023, 3:16 PM  Clinical Narrative:     This CM  spoke with pt's wife and provided bed offers listed below for choice selection . This CM also informed pt's wife her 1st choice [facility] declined a bed offer . TOC will cont to follow dc planning / care coordination and update as applicable.  [facility] SNF  Accepted -- [address], [place] [address] [phone] --  [facility]IT[facility] Preferred SNF  Accepted -- [address] [place] [address] [phone] --  [facility] Preferred SNF  Accepted -- [address], [place] [address] [phone] --  [facility]  Accepted -- [address], [place] [address] [phone] --  [facility]  Accepted -- [address] [place] [address] [phone] --                     Expected Discharge Plan and Services                                               Social Drivers of Health (SDOH) Interventions SDOH Screenings   Food Insecurity: No Food Insecurity (12/21/2023)  Housing: Low Risk  (12/21/2023)  Transportation Needs: No Transportation Needs (12/21/2023)  Utilities: Not At Risk (12/21/2023)  Depression (PHQ2-9): High Risk (07/27/2023)  Financial Resource Strain: Low Risk  (10/19/2023)   Received from [facility]  Physical Activity: Insufficiently Active (04/20/2018)  Social Connections: Moderately Isolated (12/21/2023)  Stress: No Stress Concern Present (04/20/2018)  Tobacco Use: Medium Risk (12/22/2023)    Readmission Risk Interventions     No data to  display

## 2023-12-29 NOTE — NC FL2 (Signed)
 [place]  MEDICAID FL2 LEVEL OF CARE FORM     IDENTIFICATION  Patient Name: Mark [name]. Birthdate: 1940/04/03 Sex: male Admission Date (Current Location): 12/21/2023  [place] and [place] Number:  Chiropodist and Address:  [facility], [address], [place], [place] [address]      Provider Number: [id number]  Attending Physician Name and Address:  Lenon Marien CROME, MD  Relative Name and Phone Number:  [name] [phone])    Current Level of Care: Hospital Recommended Level of Care: Skilled Nursing Facility Prior Approval Number:    Date Approved/Denied:   PASRR Number: [id number]  Discharge Plan: SNF    Current Diagnoses: Patient Active Problem List   Diagnosis Date Noted   Malnutrition of moderate degree 12/24/2023   Intractable nausea and vomiting 12/21/2023   Hx of psychosis 09/28/2022   Benign prostatic hyperplasia 08/08/2022   Acute respiratory failure with hypoxia (HCC) 08/02/2022   UTI (urinary tract infection) 08/02/2022   Psychosis (HCC) 01/22/2022   At risk for prolonged QT interval syndrome 01/22/2022   MCI (mild cognitive impairment) 07/21/2021   COVID-19 10/14/2020   MDD (major depressive disorder), recurrent, in full remission 07/12/2020   MDD (major depressive disorder), recurrent episode, mild 11/04/2018   Insomnia due to medical condition 11/04/2018   Fall 03/02/2018   Pleural effusion    Weakness 02/22/2018   Nausea 02/21/2018   Near syncope 10/24/2017   Orthostatic hypotension 10/24/2017   History of pulmonary embolus (PE) 08/12/2017   PAF (paroxysmal atrial fibrillation) (HCC) 08/12/2017   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   Abnormal cardiovascular function study 01/07/2017   Lightheadedness 12/23/2016   Lower extremity numbness 12/23/2016   Orthostatic dizziness 12/23/2016   Weakness of both lower extremities 12/23/2016   Arthritis 11/17/2016   SOB (shortness of  breath) 11/16/2016   DDD (degenerative disc disease), lumbar 07/01/2016   Lumbosacral spondylosis without myelopathy 01/07/2016   Chronic bilateral low back pain without sciatica 10/10/2015   Primary osteoarthritis of right shoulder 06/25/2015   H/O adenomatous polyp of colon 07/20/2014   Chronic anticoagulation 10/04/2012   Anxiety 09/11/2011   Cyst of left kidney 09/11/2011   GERD (gastroesophageal reflux disease) 09/11/2011   Heart murmur 09/11/2011   Hyperlipidemia 09/11/2011   Neuropathy 09/11/2011   Pulmonary embolus (HCC) 09/11/2011   Back pain, chronic 09/11/2011    Orientation RESPIRATION BLADDER Height & Weight     Self  Normal Incontinent Weight: 94.8 kg Height:  6' 4 (193 cm)  BEHAVIORAL SYMPTOMS/MOOD NEUROLOGICAL BOWEL NUTRITION STATUS     (N/A) Incontinent Diet (Diet regular with thin liquids)  AMBULATORY STATUS COMMUNICATION OF NEEDS Skin   Total Care Verbally Other (Comment) (Per Bedside RN pt has Deep Tissue Injury)                       Personal Care Assistance Level of Assistance  Bathing, Feeding, Dressing, Total care Bathing Assistance: Maximum assistance Feeding assistance: Independent Dressing Assistance: Maximum assistance Total Care Assistance: Maximum assistance   Functional Limitations Info  Hearing   Hearing Info: Impaired (Per bedside nurse pt does not wear any devices here at hospital)      SPECIAL CARE FACTORS FREQUENCY  PT (By licensed PT), OT (By licensed OT)     PT Frequency: 5 x a wk OT Frequency: 5 x a wk            Contractures Contractures Info: Not present  Additional Factors Info  Code Status Code Status Info: Full             Current Medications (12/29/2023):  This is the current hospital active medication list Current Facility-Administered Medications  Medication Dose Route Frequency Provider Last Rate Last Admin   apixaban  (ELIQUIS ) tablet 2.5 mg  2.5 mg Oral Q12H Lenon Elsie HERO, RPH   2.5 mg at  12/29/23 9163   buPROPion  (WELLBUTRIN ) tablet 75 mg  75 mg Oral Q breakfast Jinny Carmine, MD   75 mg at 12/29/23 0836   feeding supplement (ENSURE PLUS HIGH PROTEIN) liquid 237 mL  237 mL Oral BID BM Ponnala, Shruthi, MD   237 mL at 12/29/23 0837   finasteride  (PROSCAR ) tablet 5 mg  5 mg Oral Daily Jinny Carmine, MD   5 mg at 12/29/23 9163   FLUoxetine  (PROZAC ) capsule 80 mg  80 mg Oral Daily Jinny Carmine, MD   80 mg at 12/29/23 9163   meclizine  (ANTIVERT ) tablet 25 mg  25 mg Oral TID PRN Ponnala, Shruthi, MD   25 mg at 12/28/23 2143   metoprolol  tartrate (LOPRESSOR ) injection 5 mg  5 mg Intravenous Q4H PRN Jinny Carmine, MD       multivitamin with minerals tablet 1 tablet  1 tablet Oral Daily Ponnala, Shruthi, MD   1 tablet at 12/29/23 0836   nitrofurantoin  (macrocrystal-monohydrate) (MACROBID ) capsule 100 mg  100 mg Oral Q2200 Ponnala, Shruthi, MD   100 mg at 12/28/23 2209   ondansetron  (ZOFRAN ) injection 4 mg  4 mg Intravenous Q6H PRN Jinny Carmine, MD   4 mg at 12/28/23 1508   pantoprazole  (PROTONIX ) EC tablet 40 mg  40 mg Oral Daily Ponnala, Shruthi, MD   40 mg at 12/29/23 0836   polyethylene glycol (MIRALAX / GLYCOLAX) packet 17 g  17 g Oral Daily PRN Ponnala, Shruthi, MD       prochlorperazine (COMPAZINE) injection 5 mg  5 mg Intravenous Q4H PRN Ponnala, Shruthi, MD       prochlorperazine (COMPAZINE) tablet 5 mg  5 mg Oral Q6H PRN Ponnala, Shruthi, MD       senna-docusate (Senokot-S) tablet 1 tablet  1 tablet Oral QHS PRN Jinny Carmine, MD   1 tablet at 12/26/23 9377   simvastatin  (ZOCOR ) tablet 40 mg  40 mg Oral Daily Wohl, Darren, MD   40 mg at 12/29/23 9163   tamsulosin  (FLOMAX ) capsule 0.4 mg  0.4 mg Oral Daily Jinny Carmine, MD   0.4 mg at 12/29/23 9163     Discharge Medications: Please see discharge summary for a list of discharge medications.  Relevant Imaging Results:  Relevant Lab Results:   Additional Information SS# [id number]  Marinda Cooks, RN

## 2023-12-29 NOTE — Progress Notes (Signed)
 Occupational Therapy Treatment Patient Details Name: Mark [name]. MRN: [id number] DOB: [birth year] Today's Date: 12/29/2023   History of present illness Mark [name], Mark [name]. is an 83 year old male with history of GERD, paroxysmal atrial fibrillation, on chronic anticoagulation with Eliquis , hyperlipidemia, factor V Leyden, who presents ED from PCP for chief concerns of intractable nausea and vomiting. MD diagnoses include: paroxysmal atrial fibrillation, GERD, hyperlipidemia, anxiety, and chronic anticoagulation.   OT comments  Pt agreeable to OT session this date; spouse present at bedside throughout session.  Pt participatory in rolling in bed for skin checks, transitioning to EOB sitting in prep for STS trials.  STS trials completed today for 30-60 sec intervals x3, with OT educating pt/spouse on benefits for pressure relief and preventing transfer decline upon d/c from hospital.  Spouse reports that she usually pulls pt up to standing by grabbing his hands and pt will take a few steps to a chair.  Pt was cued to assist with transfer with 1 hand on walker and the other pushing from bed.  Mod A to facilitate forward WS.  Once standing, pt was able to perform side stepping toward [facility] with CGA, though 2 trials were required to move from middle of bed toward [facility].  Dizziness with sitting and standing, with spouse reporting this is baseline for pt.  Pt required pacing/rest between positional changes d/t dizziness.  Assisted pt back to supine with pillow beneath L hip and heels floated for pressure relief.  Spouse acknowledges good adherence to encouraging pt and assisting him to repositioning every 2 hours. Will continue to follow to work towards goals in OT poc.       If plan is discharge home, recommend the following:  A lot of help with walking and/or transfers;A lot of help with bathing/dressing/bathroom;Direct supervision/assist for medications management;Supervision due to cognitive status;Direct  supervision/assist for financial management;Assist for transportation;Assistance with cooking/housework;Help with stairs or ramp for entrance   Equipment Recommendations  Other (comment) (defer to next venue of care)    Recommendations for Other Services      Precautions / Restrictions Precautions Precautions: Fall Recall of Precautions/Restrictions: Impaired Restrictions Weight Bearing Restrictions Per Provider Order: No       Mobility Bed Mobility Overal bed mobility: Needs Assistance Bed Mobility: Supine to Sit, Sit to Supine, Rolling Rolling: Mod assist   Supine to sit: Mod assist Sit to supine: Mod assist   General bed mobility comments: vc/tactile cues for hand placement on bed rails to assist with rolling Patient Response: Anxious, Cooperative  Transfers Overall transfer level: Needs assistance Equipment used: Rolling walker (2 wheels)   Sit to Stand: Mod assist     Step pivot transfers: Contact guard assist     General transfer comment: Completed standing at bedside for 30-60 sec intervals, then side stepping toward HOB in prep for return to bed.     Balance Overall balance assessment: Needs assistance Sitting-balance support: Feet supported, Bilateral upper extremity supported Sitting balance-Leahy Scale: Good     Standing balance support: Bilateral upper extremity supported, Reliant on assistive device for balance Standing balance-Leahy Scale: Fair Standing balance comment: Limited tolerance for standing d/t dizziness             High level balance activites: Side stepping High Level Balance Comments: min guard and RW for side stepping toward [facility]           ADL either performed or assessed with clinical judgement   ADL Overall ADL's : Needs assistance/impaired  Lower Body Dressing: Sitting/lateral leans;Total assistance Lower Body Dressing Details (indicate cue type and reason): dep to doff/doff socks for skin  check                    Extremity/Trunk Assessment Upper Extremity Assessment Upper Extremity Assessment: Overall WFL for tasks assessed   Lower Extremity Assessment Lower Extremity Assessment: Generalized weakness   Cervical / Trunk Assessment Cervical / Trunk Assessment: Normal    Vision       Perception     Praxis     Communication Communication Communication: No apparent difficulties Factors Affecting Communication: Hearing impaired   Cognition Arousal: Alert Behavior During Therapy: WFL for tasks assessed/performed Cognition: History of cognitive impairments                               Following commands: Impaired Following commands impaired: Follows one step commands with increased time, Follows one step commands inconsistently      Cueing   Cueing Techniques: Verbal cues, Tactile cues  Exercises Other Exercises Other Exercises: Reinforced positional changes every 2 hours to reduce risk for skin breakdown.  Assisted pt to float heels in bed; encouraged this at home for any prolonged time in supine; spouse receptive.    Shoulder Instructions       General Comments Pt verbalized L foot hurting; skin check performed with spouse acknowledging pt's pain from corn on bottom of L foot.  Heels intact bilaterally.  Sacral bandage in place but skin check around bandage completed and noted to be intact.    Pertinent Vitals/ Pain       Pain Assessment Pain Assessment: Faces Pain Score: 2  Pain Location: Bottom of L foot (Corn) Pain Descriptors / Indicators: Tender Pain Intervention(s): Monitored during session, Limited activity within patient's tolerance, Repositioned  Home Living                                          Prior Functioning/Environment              Frequency  Min 1X/week        Progress Toward Goals  OT Goals(current goals can now be found in the care plan section)  Progress towards OT goals:  Progressing toward goals  Acute Rehab OT Goals Patient Stated Goal: get better OT Goal Formulation: With patient/family Time For Goal Achievement: 01/06/24 Potential to Achieve Goals: Good  Plan      Co-evaluation                 AM-PAC OT 6 Clicks Daily Activity     Outcome Measure   Help from another person eating meals?: A Little Help from another person taking care of personal grooming?: A Little Help from another person toileting, which includes using toliet, bedpan, or urinal?: A Lot Help from another person bathing (including washing, rinsing, drying)?: A Lot Help from another person to put on and taking off regular upper body clothing?: A Lot Help from another person to put on and taking off regular lower body clothing?: A Lot 6 Click Score: 14    End of Session Equipment Utilized During Treatment: Rolling walker (2 wheels);Gait belt  OT Visit Diagnosis: Other abnormalities of gait and mobility (R26.89);Other symptoms and signs involving cognitive function Pain - Right/Left: Left Pain - part of body:  Ankle and joints of foot   Activity Tolerance Patient tolerated treatment well   Patient Left in bed;with call bell/phone within reach;with bed alarm set;with family/visitor present   Nurse Communication          Time: 8845-8781 OT Time Calculation (min): 24 min  Charges: OT General Charges $OT Visit: 1 Visit OT Treatments $Self Care/Home Management : 23-37 mins  Inocente Blazing, MS, OTR/L   Inocente MARLA Blazing 12/29/2023, 3:08 PM

## 2023-12-29 NOTE — TOC Progression Note (Addendum)
 Transition of Care (TOC) - Progression Note    Patient Details  Name: Mark [name]. MRN: [id number] Date of Birth: 26-Nov-1940  Transition of Care [facility]) CM/SW Contact  Marinda Cooks, RN Phone Number: 12/29/2023, 9:15 AM  Clinical Narrative:     This CM arrived spoke with pt's wife Mrs Jostin Rue introduced role and completed Initial assessment with her . Pt A&Ox1& lives in a Single family ranch style home  with ramp entry. Pt has family support provided by wife & his children. Pt was a total care per wife prior to this hospital admission only able to brush his teeth& feed hisself.   [facility] uses [facility] & PCP is listed as Dr. Vishawanath Hande at [facility] . Pt has access to  DME that includes  transport w/c, BSC, shower chair, & pull up bars. Mrs. Beckles  reports pt was  not  in SNF prior to  this admission. Pt  has HH services open currently  with [facility] this CM update liaison [place] of pt's hospitalization . Pt denies/confirms community resources with .CM will cont to follow pt during hospital stay and update as applicable.   This CM discussed DC plan/ recommendations of PT for pt to have SNF/Rehab at dc . Pt and his wife in agreement with this plan. TOC will coordinate SNF work up . TOC will cont to follow dc planning / care coordination and update as applicable.                     Expected Discharge Plan and Services                                               Social Drivers of Health (SDOH) Interventions SDOH Screenings   Food Insecurity: No Food Insecurity (12/21/2023)  Housing: Low Risk  (12/21/2023)  Transportation Needs: No Transportation Needs (12/21/2023)  Utilities: Not At Risk (12/21/2023)  Depression (PHQ2-9): High Risk (07/27/2023)  Financial Resource Strain: Low Risk  (10/19/2023)   Received from [facility]  Physical Activity: Insufficiently Active (04/20/2018)  Social Connections: Moderately Isolated (12/21/2023)   Stress: No Stress Concern Present (04/20/2018)  Tobacco Use: Medium Risk (12/22/2023)    Readmission Risk Interventions     No data to display

## 2023-12-29 NOTE — Progress Notes (Addendum)
 Physical Therapy Treatment Patient Details Name: Mark [name]. MRN: [id number] DOB: 08-22-1940 Today's Date: 12/29/2023   History of Present Illness Mark [name], Mark [name]. is an 83 year old male with history of GERD, paroxysmal atrial fibrillation, on chronic anticoagulation with Eliquis , hyperlipidemia, factor V Leyden, who presents ED from PCP for chief concerns of intractable nausea and vomiting. MD diagnoses include: paroxysmal atrial fibrillation, GERD, hyperlipidemia, anxiety, and chronic anticoagulation.    PT Comments  Patient seen for PT session focused on strengthening and vestibular training. Patient required modA  for bed mobility to sit EOB. Vestibular exercises preformed in bed with moderate increase in dizziness during training. Tolerated session well  with some dizziness. Main limiting factors today were baseline limitations in mobility and strength deficits to perform OOB vestibular exercises. Interventions aimed at improving dizziness and functional strength. Continued skilled PT recommended to progress toward functional goals and support discharge readiness.    If plan is discharge home, recommend the following: A lot of help with bathing/dressing/bathroom;Supervision due to cognitive status;Assist for transportation;Direct supervision/assist for medications management;Two people to help with walking and/or transfers   Can travel by private vehicle     No  Equipment Recommendations  None recommended by PT    Recommendations for Other Services       Precautions / Restrictions       Mobility  Bed Mobility Overal bed mobility: Needs Assistance Bed Mobility: Supine to Sit Rolling: Mod assist   Supine to sit: Mod assist     General bed mobility comments: NT, in recliner pre and post session    Transfers Overall transfer level: Needs assistance Equipment used: Rolling walker (2 wheels)               General transfer comment: VC for hand placement, VC  sequencing    Ambulation/Gait                   Stairs             Wheelchair Mobility     Tilt Bed    Modified Rankin (Stroke Patients Only)       Balance Overall balance assessment: Needs assistance Sitting-balance support: Feet supported, Bilateral upper extremity supported Sitting balance-Leahy Scale: Good Sitting balance - Comments: Able to maintain sitting balance at EOB. Pt reports dizziness with stand<>sit. Sypmtoms improve with prolonged sit.                                    Communication Communication Communication: No apparent difficulties  Cognition Arousal: Alert Behavior During Therapy: WFL for tasks assessed/performed                             Following commands: Impaired Following commands impaired: Follows one step commands with increased time, Follows one step commands inconsistently    Cueing Cueing Techniques: Verbal cues, Tactile cues  Exercises Other Exercises Other Exercises: vestibular exercises in bed( saccades 3 x 10) x1 in laying in bed and x1 in sitting EOB; saccades with correction rt./lf. cervical rotation 1 x 10 in each position laying and sitting EOB; Other Exercises: sitting eyes closed EOB with cervical Rt/Lf rotation x 10 each is     General Comments        Pertinent Vitals/Pain      Home Living  Prior Function            PT Goals (current goals can now be found in the care plan section) Acute Rehab PT Goals Patient Stated Goal: maintain functional mobility PT Goal Formulation: With family Time For Goal Achievement: 01/06/24 Potential to Achieve Goals: Good Progress towards PT goals: Progressing toward goals    Frequency    Min 2X/week      PT Plan      Co-evaluation              AM-PAC PT 6 Clicks Mobility   Outcome Measure  Help needed turning from your back to your side while in a flat bed without using bedrails?: A  Little Help needed moving from lying on your back to sitting on the side of a flat bed without using bedrails?: A Little Help needed moving to and from a bed to a chair (including a wheelchair)?: A Little Help needed standing up from a chair using your arms (e.g., wheelchair or bedside chair)?: A Lot Help needed to walk in hospital room?: A Lot Help needed climbing 3-5 steps with a railing? : A Lot 6 Click Score: 15    End of Session Equipment Utilized During Treatment: Gait belt Activity Tolerance: Patient tolerated treatment well;No increased pain Patient left: with call bell/phone within reach;with family/visitor present;in bed Nurse Communication: Mobility status PT Visit Diagnosis: Unsteadiness on feet (R26.81);Repeated falls (R29.6);Difficulty in walking, not elsewhere classified (R26.2);Dizziness and giddiness (R42)     Time: 9051-8984 PT Time Calculation (min) (ACUTE ONLY): 27 min  Charges:    $Therapeutic Exercise: 8-22 mins $Neuromuscular Re-education: 8-22 mins PT General Charges $$ ACUTE PT VISIT: 1 Visit                     Sherlean Lesches DPT, PT     Sherlean A Ehab Humber 12/29/2023, 10:54 AM

## 2023-12-29 NOTE — Plan of Care (Signed)

## 2023-12-29 NOTE — Progress Notes (Incomplete Revision)
 PROGRESS NOTE Mark [name].  FMW:[id number] DOB: 03-01-1941 DOA: 12/21/2023 PCP: Sadie Manna, MD  Chief Complaint  Patient presents with   Emesis   Hospital Course:  Mr. Royer [name], [name]. is an 83 year old male with history of GERD, paroxysmal atrial fibrillation, on chronic anticoagulation with Eliquis , hyperlipidemia, factor V Leyden, who presents ED from PCP for chief concerns of intractable nausea and vomiting.  Subjective: Patient was examined, wife at the bedside. He continues to have unchanged nausea and dizziness with head or eye movements that did cause two episode of vomiting when getting to the commode yesterday. No symptoms with rest and complete stillness.   Objective: Vitals:   12/27/23 2036 12/28/23 0402 12/28/23 1450 12/28/23 1928  BP: 112/60 (!) 111/53 118/65 (!) 127/59  Pulse: 71 (!) 58 79 75  Resp: 18 18 18 20   Temp: 97.7 F (36.5 C) 97.7 F (36.5 C) 98.1 F (36.7 C) 97.6 F (36.4 C)  TempSrc:      SpO2: 96% 96% 96% 96%  Weight:      Height:        Intake/Output Summary (Last 24 hours) at 12/29/2023 0738 Last data filed at 12/28/2023 1016 Gross per 24 hour  Intake 240 ml  Output 500 ml  Net -260 ml   Filed Weights   12/21/23 1445  Weight: 94.8 kg   Examination: Constitutional: appears age-appropriate, frail, NAD, calm, horizontal nystagmus. Hard of hearing Respiratory: clear to auscultation bilaterally, no wheezing, no crackles. Normal respiratory effort. No accessory muscle use.  Skin: no rashes, lesions, ulcers on exposed skin Neurologic: Sensation intact. Strength 5/5 in all 4.   Assessment & Plan:  Intractable nausea and vomiting- EGD done 10/01, no identifiable cause for his symptoms, Seen by GI. 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- MRI brain 08/25 no acute abnormality - Orthostatic vitals negative in sitting and lying position - Symptoms better with meclizine  trial - Discussed with Dr.Vaught, ENT - trial of prednisone taper 60mg  over 6  days until 10/07, follow up outpatient - Tolerating regular diet, encourage small portions multiple times of the day - Ondansetron  prn, Compazine prn - stopping macrobid  as a trial to see if contributing to his symptoms  Hypokalemia - Resolved - monitor and replete as needed   PAF (paroxysmal atrial fibrillation) (HCC) - Eliquis    Hyperlipidemia Simvastatin  40 mg daily   GERD (gastroesophageal reflux disease) PPI   Recurrent UTI- has been on nightly macrobid  for a year for frequent UTIs. Although his symptoms of presentation have only been present since August, he may be more sensitive to the side effects of the medication with aging and prolonged use so have discussed risks and benefits with his wife about discontinuing this and given the severity of his symptoms for dizziness and nausea/vomiting, we are going to stop the macrobid  and trial off of it to see if his symptoms improve in a few days.  - discussing with pharmacy an alternative UTI ppx if needed  - per Will Lenon, [facility]- some alternative options for ppx may be: cranberry supplements which do have evidence but again only in women -Methenamine hippurate 1g twice is a well-tolerated, non-antibiotic alternative that has evidence for UTI ppx for those with intact bladder anatomy, If you want to keep the nitrofurantoin , would recommend considering Macrodantin  50mg  once daily   Constipation - bowel regimen  - PT/OT eval - rec SNF, may benefit from vestibular rehab  DVT prophylaxis: Eliquis    Code Status: Full Code  Disposition:  SNF - pending  placement  Consultants:  GI ENT  Procedures:  EGD  Data Reviewed: I have personally reviewed following labs and imaging studies CBC: Recent Labs  Lab 12/23/23 0224 12/24/23 0418  WBC 5.4  --   HGB 12.9* 14.4  HCT 39.1  --   MCV 93.3  --   PLT 197  --    Basic Metabolic Panel: Recent Labs  Lab 12/23/23 0224 12/24/23 0418 12/25/23 0320  NA 137 137 137  K 3.2* 4.6 4.0   CL 106 105 103  CO2 23 26 23   GLUCOSE 85 142* 125*  BUN 12 12 18   CREATININE 0.78 0.81 0.81  CALCIUM 8.5* 9.2 8.8*  MG 2.1 2.2  --     LOS: 8 days  MDM: Patient is high risk for one or more organ failure.  They necessitate ongoing hospitalization for continued IV therapies and subsequent lab monitoring. Total time spent interpreting labs and vitals, reviewing the medical record, coordinating care amongst consultants and care team members, directly assessing and discussing care with the patient and/or family: 55 min  Marien LITTIE Piety, MD [facility]  To contact the attending physician between 7A-7P please use Epic Chat. To contact the covering physician during after hours 7P-7A, please review Amion.  12/29/2023, 7:38 AM

## 2023-12-29 NOTE — Progress Notes (Signed)
 PROGRESS NOTE Mark [name] Mark [name].  FMW:[id number] DOB: 03-01-1941 DOA: 12/21/2023 PCP: Sadie Manna, MD  Chief Complaint  Patient presents with   Emesis   Hospital Course:  [name], Mark [name]. is an 83 year old male with history of GERD, paroxysmal atrial fibrillation, on chronic anticoagulation with Eliquis , hyperlipidemia, factor V Leyden, who presents ED from PCP for chief concerns of intractable nausea and vomiting.  Subjective: Patient was examined, wife at the bedside. He continues to have unchanged nausea and dizziness with head or eye movements that did cause two episode of vomiting when getting to the commode yesterday. No symptoms with rest and complete stillness.   Objective: Vitals:   12/27/23 2036 12/28/23 0402 12/28/23 1450 12/28/23 1928  BP: 112/60 (!) 111/53 118/65 (!) 127/59  Pulse: 71 (!) 58 79 75  Resp: 18 18 18 20   Temp: 97.7 F (36.5 C) 97.7 F (36.5 C) 98.1 F (36.7 C) 97.6 F (36.4 C)  TempSrc:      SpO2: 96% 96% 96% 96%  Weight:      Height:        Intake/Output Summary (Last 24 hours) at 12/29/2023 0738 Last data filed at 12/28/2023 1016 Gross per 24 hour  Intake 240 ml  Output 500 ml  Net -260 ml   Filed Weights   12/21/23 1445  Weight: 94.8 kg   Examination: Constitutional: appears age-appropriate, frail, NAD, calm, horizontal nystagmus. Hard of hearing Respiratory: clear to auscultation bilaterally, no wheezing, no crackles. Normal respiratory effort. No accessory muscle use.  Skin: no rashes, lesions, ulcers on exposed skin Neurologic: Sensation intact. Strength 5/5 in all 4.   Assessment & Plan:  Intractable nausea and vomiting- EGD done 10/01, no identifiable cause for his symptoms, Seen by GI. 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- MRI brain 08/25 no acute abnormality - Orthostatic vitals negative in sitting and lying position - Symptoms better with meclizine  trial - Discussed with Dr.Vaught, ENT - trial of prednisone taper 60mg  over 6  days until 10/07, follow up outpatient - Tolerating regular diet, encourage small portions multiple times of the day - Ondansetron  prn, Compazine prn - stopping macrobid  as a trial to see if contributing to his symptoms  Hypokalemia - Resolved - monitor and replete as needed   PAF (paroxysmal atrial fibrillation) (HCC) - Eliquis    Hyperlipidemia Simvastatin  40 mg daily   GERD (gastroesophageal reflux disease) PPI   Recurrent UTI- has been on nightly macrobid  for a year for frequent UTIs. Although his symptoms of presentation have only been present since August, he may be more sensitive to the side effects of the medication with aging and prolonged use so have discussed risks and benefits with his wife about discontinuing this and given the severity of his symptoms for dizziness and nausea/vomiting, we are going to stop the macrobid  and trial off of it to see if his symptoms improve in a few days.  - discussing with pharmacy an alternative UTI ppx if needed  - per Will Lenon, [facility]- some alternative options for ppx may be: cranberry supplements which do have evidence but again only in women -Methenamine hippurate 1g twice is a well-tolerated, non-antibiotic alternative that has evidence for UTI ppx for those with intact bladder anatomy, If you want to keep the nitrofurantoin , would recommend considering Macrodantin  50mg  once daily   Constipation - bowel regimen  - PT/OT eval - rec SNF, may benefit from vestibular rehab  DVT prophylaxis: Eliquis    Code Status: Full Code  Disposition:  SNF - pending  placement  Consultants:  GI ENT  Procedures:  EGD  Data Reviewed: I have personally reviewed following labs and imaging studies CBC: Recent Labs  Lab 12/23/23 0224 12/24/23 0418  WBC 5.4  --   HGB 12.9* 14.4  HCT 39.1  --   MCV 93.3  --   PLT 197  --    Basic Metabolic Panel: Recent Labs  Lab 12/23/23 0224 12/24/23 0418 12/25/23 0320  NA 137 137 137  K 3.2* 4.6 4.0   CL 106 105 103  CO2 23 26 23   GLUCOSE 85 142* 125*  BUN 12 12 18   CREATININE 0.78 0.81 0.81  CALCIUM 8.5* 9.2 8.8*  MG 2.1 2.2  --     LOS: 8 days  MDM: Patient is high risk for one or more organ failure.  They necessitate ongoing hospitalization for continued IV therapies and subsequent lab monitoring. Total time spent interpreting labs and vitals, reviewing the medical record, coordinating care amongst consultants and care team members, directly assessing and discussing care with the patient and/or family: 55 min  Marien LITTIE Piety, MD [facility]  To contact the attending physician between 7A-7P please use Epic Chat. To contact the covering physician during after hours 7P-7A, please review Amion.  12/29/2023, 7:38 AM

## 2023-12-29 NOTE — Progress Notes (Signed)
 Nutrition Follow-up  DOCUMENTATION CODES:   Non-severe (moderate) malnutrition in context of chronic illness  INTERVENTION:   -Continue MVI with minerals daily -Continue regular diet -Continue Ensure Plus High Protein po BID, each supplement provides 350 kcal and 20 grams of protein  -Double protein portions with meals  NUTRITION DIAGNOSIS:   Moderate Malnutrition related to chronic illness as evidenced by mild fat depletion, mild muscle depletion, moderate muscle depletion, percent weight loss.  Ongoing  GOAL:   Patient will meet greater than or equal to 90% of their needs  Progressing   MONITOR:   PO intake, Supplement acceptance, Diet advancement  REASON FOR ASSESSMENT:   Consult Assessment of nutrition requirement/status  ASSESSMENT:   Pt with history of GERD, paroxysmal atrial fibrillation, on chronic anticoagulation with Eliquis , hyperlipidemia, factor V Leyden, who presents for chief concerns of intractable nausea and vomiting.  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   Spoke with Mr Deshler and wife at bedside. He was pleasant and in good spirits; smiling and eager to engage RD in conversation. Both confirm that he has a good appetite; wife assist with cutting up his breakfast. He has been consuming 100% of meals and drinking Ensure. He states my only complaint about my food is there is not enough of it.   Reviewed plan of care; Mr Tullis and his wife amenable to nutritional care plan. They have no further questions, but expressed appreciation for visit.   No new wt since admission.   Per TOC notes, plan for SNF placement at discharge.   Medications reviewed and include protonix .   Labs  reviewed.    Diet Order:   Diet Order             Diet regular Room service appropriate? Yes; Fluid consistency: Thin  Diet effective now                   EDUCATION NEEDS:   Education needs have been addressed  Skin:  Skin Assessment: Skin Integrity Issues: Skin Integrity Issues:: DTI DTI: sacrum and lt buttock  Last BM:  12/29/23  Height:   Ht Readings from Last 1 Encounters:  12/21/23 6' 4 (1.93 m)    Weight:   Wt Readings from Last 1 Encounters:  12/21/23 94.8 kg    Ideal Body Weight:  91.8 kg  BMI:  Body mass index is 25.44 kg/m.  Estimated Nutritional Needs:   Kcal:  2200-2400  Protein:  120-135 grams  Fluid:  2.0-2.2 L    Margery ORN, RD, LDN, CDCES Registered Dietitian III Certified Diabetes Care and Education Specialist If unable to reach this RD, please use RD Inpatient group chat on secure chat between hours of 8am-4 pm daily

## 2023-12-30 DIAGNOSIS — R112 Nausea with vomiting, unspecified: Secondary | ICD-10-CM | POA: Diagnosis not present

## 2023-12-30 LAB — GLUCOSE, CAPILLARY: Glucose-Capillary: 84 mg/dL (ref 70–99)

## 2023-12-30 MED ORDER — SENNA 8.6 MG PO TABS: 1.0000 | ORAL_TABLET | Freq: Every day | ORAL | Status: DC | PRN

## 2023-12-30 NOTE — TOC Progression Note (Signed)
 Transition of Care (TOC) - Progression Note    Patient Details  Name: Mark [name]. MRN: [id number] Date of Birth: [birth year]  Transition of Care [facility]) CM/SW Contact  Marinda Cooks, RN Phone Number: 12/30/2023, 8:44 AM  Clinical Narrative:     This CM rec'd call from pt's wife Mrs. Mark [name] informing her 1st choice for SNF is [facility] . This CM sent message t admission liaison to confirm bed offer awaiting response . TOC will cont to follow dc planning / care coordination and update as applicable.                    Expected Discharge Plan and Services                                               Social Drivers of Health (SDOH) Interventions SDOH Screenings   Food Insecurity: No Food Insecurity (12/21/2023)  Housing: Low Risk  (12/21/2023)  Transportation Needs: No Transportation Needs (12/21/2023)  Utilities: Not At Risk (12/21/2023)  Depression (PHQ2-9): High Risk (07/27/2023)  Financial Resource Strain: Low Risk  (10/19/2023)   Received from [facility]  Physical Activity: Insufficiently Active (04/20/2018)  Social Connections: Moderately Isolated (12/21/2023)  Stress: No Stress Concern Present (04/20/2018)  Tobacco Use: Medium Risk (12/22/2023)    Readmission Risk Interventions     No data to display

## 2023-12-30 NOTE — Progress Notes (Signed)
 PROGRESS NOTE Mark [name].  FMW:[id number] DOB: 1940-12-04 DOA: 12/21/2023 PCP: Sadie Manna, MD  Chief Complaint  Patient presents with   Emesis   Hospital Course:  [name], [name]. is an 83 year old male with history of GERD, paroxysmal atrial fibrillation, on chronic anticoagulation with Eliquis , hyperlipidemia, factor V Leyden, who presents to ED from PCP for chief concerns of intractable nausea and vomiting.  Subjective: Patient was examined, wife at the bedside. He feels alright today. Had nausea with moving in bed when they changed his sheets but no vomiting. Does not feel any significant improvement since yesterday.    Objective: Vitals:   12/29/23 0749 12/29/23 1535 12/29/23 2018 12/30/23 0432  BP: 112/62 (!) 105/55 (!) 99/56 (!) 100/54  Pulse: 61 82 75 71  Resp: 17 18 18 16   Temp: 98.4 F (36.9 C) 98.9 F (37.2 C) 98.4 F (36.9 C) 98.5 F (36.9 C)  TempSrc: Oral Oral    SpO2: 95% 94% 96% 95%  Weight:      Height:        Intake/Output Summary (Last 24 hours) at 12/30/2023 0718 Last data filed at 12/29/2023 1700 Gross per 24 hour  Intake 630 ml  Output --  Net 630 ml   Filed Weights   12/21/23 1445  Weight: 94.8 kg   Examination: Constitutional: appears age-appropriate, frail, NAD, calm. Hard of hearing Respiratory: Normal respiratory effort. No accessory muscle use.  Skin: no rashes, lesions, ulcers on exposed skin Neurologic: Sensation intact. Strength 5/5 in all 4.   Assessment & Plan:  Intractable nausea and vomiting- EGD done 10/01, no identifiable cause for his symptoms, Seen by GI. 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- MRI brain 08/25 no acute abnormality - Orthostatic vitals negative in sitting and lying position - Symptoms better with meclizine  trial - Discussed with Dr.Vaught, ENT - trial of prednisone taper 60mg  over 6 days until 10/07, follow up outpatient - recommend vestibular therapy outpatient  - Tolerating regular diet, encourage  small portions multiple times of the day - Ondansetron  prn, Compazine prn - stopping macrobid  as a trial to see if contributing to his symptoms  Hypokalemia - Resolved - monitor and replete as needed   PAF (paroxysmal atrial fibrillation) (HCC) - Eliquis    Hyperlipidemia Simvastatin  40 mg daily   GERD (gastroesophageal reflux disease) PPI   Recurrent UTI- has been on nightly macrobid  for a year for frequent UTIs. Although his symptoms of presentation have only been present since August, he may be more sensitive to the side effects of the medication with aging and prolonged use so have discussed risks and benefits with his wife about discontinuing this and given the severity of his symptoms for dizziness and nausea/vomiting, we are going to stop the macrobid  and trial off of it to see if his symptoms improve in a few days.  - they are interested in starting a cranberry supplement for ppx following dc instead of the macrobid .   Constipation- last BM today - bowel regimen  - PT/OT eval - rec SNF, may benefit from vestibular rehab  DVT prophylaxis: Eliquis    Code Status: Full Code  Disposition:  SNF - pending placement  Consultants:  GI ENT  Procedures:  EGD  Data Reviewed: I have personally reviewed following labs and imaging studies CBC: Recent Labs  Lab 12/24/23 0418  HGB 14.4   Basic Metabolic Panel: Recent Labs  Lab 12/24/23 0418 12/25/23 0320  NA 137 137  K 4.6 4.0  CL 105 103  CO2  26 23  GLUCOSE 142* 125*  BUN 12 18  CREATININE 0.81 0.81  CALCIUM 9.2 8.8*  MG 2.2  --     LOS: 9 days  MDM: Patient is high risk for one or more organ failure.  They necessitate ongoing hospitalization for continued IV therapies and subsequent lab monitoring. Total time spent interpreting labs and vitals, reviewing the medical record, coordinating care amongst consultants and care team members, directly assessing and discussing care with the patient and/or family: 55  min  Marien LITTIE Piety, MD [facility]  To contact the attending physician between 7A-7P please use Epic Chat. To contact the covering physician during after hours 7P-7A, please review Amion.  12/30/2023, 7:18 AM

## 2023-12-30 NOTE — Progress Notes (Signed)
 Physical Therapy Treatment Patient Details Name: Mark [name]. MRN: [id number] DOB: 1940-09-13 Today's Date: 12/30/2023   History of Present Illness [name], Mark [name]. is an 83 year old male with history of GERD, paroxysmal atrial fibrillation, on chronic anticoagulation with Eliquis , hyperlipidemia, factor V Leyden, who presents ED from PCP for chief concerns of intractable nausea and vomiting. MD diagnoses include: paroxysmal atrial fibrillation, GERD, hyperlipidemia, anxiety, and chronic anticoagulation.    PT Comments  Pt was supine-long sitting in bed (HOB elevated ~ 40 degrees) upon arrival. He is A with supportive spouse at bedside however only truly oriented x 2. He needed a lot of encouragement to perform OOB activity. BP MAP maintained > 75 throughout session. Per spouse, pt has not been ambulatory for  about 1 year  due to severity of his orthostatic hypotension and poor cognition/safety awareness. Pt was able to tolerate with extensive assistance, getting OOB towards R and and stand-step pivoting to recliner. Pt requires constant assistance and vcs. DC recs remain appropriate to maximize his independence while decreasing caregiver burden.   If plan is discharge home, recommend the following: A lot of help with bathing/dressing/bathroom;Supervision due to cognitive status;Assist for transportation;Direct supervision/assist for medications management;Two people to help with walking and/or transfers     Equipment Recommendations  Other (comment) (Defer to next level of care)       Precautions / Restrictions Precautions Precautions: Fall Recall of Precautions/Restrictions: Impaired Restrictions Weight Bearing Restrictions Per Provider Order: No     Mobility  Bed Mobility Overal bed mobility: Needs Assistance Bed Mobility: Supine to Sit  Supine to sit: Mod assist, HOB elevated, Used rails Sit to supine: Mod assist General bed mobility comments: pt required mod assist to  safely exit R side on bed. pt needs increased time and vcs for sequencing. 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 Pt does have sever arthritis. BP was read in standing with MAP >72    Ambulation/Gait Ambulation/Gait assistance: Min assist, Mod assist Gait Distance (Feet): 3 Feet Assistive device: Rolling walker (2 wheels) Gait Pattern/deviations: Step-to pattern Gait velocity: decreased  General Gait Details: pt did take a few poor quality steps form EOB to recliner. Per spouse, pt does not ambulate at baseline. HHPT has worked with pt to get him ambulatory again however pt is limited by ongoing orthostatic hypotension for ~ 1 year    Balance Overall balance assessment: Needs assistance Sitting-balance support: Feet supported, Bilateral upper extremity supported Sitting balance-Leahy Scale: Good     Standing balance support: Bilateral upper extremity supported, During functional activity, Reliant on assistive device for balance Standing balance-Leahy Scale: Fair       Hotel manager: No apparent difficulties  Cognition Arousal: Alert Behavior During Therapy: WFL for tasks assessed/performed   PT - Cognitive impairments: History of cognitive impairments    PT - Cognition Comments: Pt has history of cognitive impairments. Pt's spouse is available to provide information about baseline. Pt unable to answer PLOF questions accurately. Pt is A&Ox2. Overall poor safety awareness and limitations. extensive hx of orthostatic hypotension per spouse Following commands: Impaired Following commands impaired: Follows one step commands inconsistently, Follows one step commands with increased  time    Cueing Cueing Techniques: Verbal cues,  Tactile cues, Visual cues     General Comments General comments (skin integrity, edema, etc.): Pt is a little self limiting and needed constant encouragement to perform task requested of him.      Pertinent Vitals/Pain Pain Assessment Pain Assessment: PAINAD Breathing: normal Negative Vocalization: occasional moan/groan, low speech, negative/disapproving quality Facial Expression: sad, frightened, frown Body Language: tense, distressed pacing, fidgeting Consolability: no need to console PAINAD Score: 3 Pain Location:  ( all over.) Pain Intervention(s): Limited activity within patient's tolerance, Monitored during session, Premedicated before session, Repositioned     PT Goals (current goals can now be found in the care plan section) Acute Rehab PT Goals Patient Stated Goal: none stated Progress towards PT goals: Progressing toward goals    Frequency    Min 2X/week       AM-PAC PT 6 Clicks Mobility   Outcome Measure  Help needed turning from your back to your side while in a flat bed without using bedrails?: A Little Help needed moving from lying on your back to sitting on the side of a flat bed without using bedrails?: A Little Help needed moving to and from a bed to a chair (including a wheelchair)?: A Lot Help needed standing up from a chair using your arms (e.g., wheelchair or bedside chair)?: A Lot Help needed to walk in hospital room?: A Lot Help needed climbing 3-5 steps with a railing? : A Lot 6 Click Score: 14    End of Session   Activity Tolerance: Patient tolerated treatment well Patient left: in chair;with call bell/phone within reach;with chair alarm set;with family/visitor present Nurse Communication: Mobility status PT Visit Diagnosis: Unsteadiness on feet (R26.81);Repeated falls (R29.6);Difficulty in walking, not elsewhere classified (R26.2);Dizziness and giddiness (R42)     Time: 8953-8893 PT  Time Calculation (min) (ACUTE ONLY): 20 min  Charges:    $Therapeutic Activity: 8-22 mins PT General Charges $$ ACUTE PT VISIT: 1 Visit                    Rankin Essex PTA 12/30/23, 2:03 PM

## 2023-12-30 NOTE — TOC Progression Note (Signed)
 Transition of Care (TOC) - Progression Note    Patient Details  Name: Mark [name]. MRN: [id number] Date of Birth: 1940/05/18  [facility] CM/SW Contact  Marinda Cooks, RN Phone Number: 12/30/2023, 9:44 AM  Clinical Narrative:    This CM spoke with pt's wife and rec'd her SNF selection as [employer] . This CM reached out to the liaison and was informed they would not have a bed opening until tomm tentatively .Medical team updated . TOC will cont to follow dc planning and update as applicable.                     Expected Discharge Plan and Services                                               Social Drivers of Health (SDOH) Interventions SDOH Screenings   Food Insecurity: No Food Insecurity (12/21/2023)  Housing: Low Risk  (12/21/2023)  Transportation Needs: No Transportation Needs (12/21/2023)  Utilities: Not At Risk (12/21/2023)  Depression (PHQ2-9): High Risk (07/27/2023)  Financial Resource Strain: Low Risk  (10/19/2023)   Received from [facility]  Physical Activity: Insufficiently Active (04/20/2018)  Social Connections: Moderately Isolated (12/21/2023)  Stress: No Stress Concern Present (04/20/2018)  Tobacco Use: Medium Risk (12/22/2023)    Readmission Risk Interventions     No data to display

## 2023-12-30 NOTE — Plan of Care (Signed)

## 2023-12-30 NOTE — Plan of Care (Signed)
  Problem: Education: Goal: Knowledge of General Education information will improve Description: Including pain rating scale, medication(s)/side effects and non-pharmacologic comfort measures Outcome: Progressing   Problem: Health Behavior/Discharge Planning: Goal: Ability to manage health-related needs will improve Outcome: Progressing   Problem: Clinical Measurements: Goal: Ability to maintain clinical measurements within normal limits will improve Outcome: Progressing Goal: Will remain free from infection Outcome: Progressing Goal: Cardiovascular complication will be avoided Outcome: Progressing   Problem: Activity: Goal: Risk for activity intolerance will decrease Outcome: Progressing   Problem: Nutrition: Goal: Adequate nutrition will be maintained Outcome: Progressing   Problem: Elimination: Goal: Will not experience complications related to bowel motility Outcome: Progressing   Problem: Pain Managment: Goal: General experience of comfort will improve and/or be controlled Outcome: Progressing   Problem: Safety: Goal: Ability to remain free from injury will improve Outcome: Progressing   Problem: Skin Integrity: Goal: Risk for impaired skin integrity will decrease Outcome: Progressing

## 2023-12-31 DIAGNOSIS — R112 Nausea with vomiting, unspecified: Secondary | ICD-10-CM | POA: Diagnosis not present

## 2023-12-31 DIAGNOSIS — H8120 Vestibular neuronitis, unspecified ear: Secondary | ICD-10-CM

## 2023-12-31 MED ORDER — SENNA 8.6 MG PO TABS: 1.0000 | ORAL_TABLET | Freq: Every day | ORAL | Status: AC | PRN

## 2023-12-31 MED ORDER — POLYETHYLENE GLYCOL 3350 17 G PO PACK: 17.0000 g | PACK | Freq: Every day | ORAL | Status: AC | PRN

## 2023-12-31 MED ORDER — ORAL CARE MOUTH RINSE: 15.0000 mL | OROMUCOSAL | Status: DC | PRN

## 2023-12-31 MED ORDER — CRANBERRY 250 MG PO TABS: 250.0000 mg | ORAL_TABLET | Freq: Every day | ORAL | Status: AC

## 2023-12-31 MED ORDER — MECLIZINE HCL 25 MG PO TABS: 25.0000 mg | ORAL_TABLET | Freq: Three times a day (TID) | ORAL | Status: AC | PRN

## 2023-12-31 NOTE — Discharge Summary (Signed)
 Physician Discharge Summary  Patient: Mark [name]. FMW:[id number] DOB: [birth year]   Code Status: Full Code Admit date: 12/21/2023 Discharge date: 12/31/2023 Disposition: Skilled nursing facility, PT, OT, nurse aid, and RN PCP: Sadie Manna, MD  Recommendations for Outpatient Follow-up:  Follow up with PCP within 1-2 weeks Regarding general hospital follow up and preventative care Recommend referral for vestibular physical therapy Would not recommend resuming macrobid  as this may further worsen dizziness and nausea  Discharge Diagnoses:  Principal Problem:   Intractable nausea and vomiting Active Problems:   Anxiety   Chronic anticoagulation   GERD (gastroesophageal reflux disease)   Hyperlipidemia   PAF (paroxysmal atrial fibrillation) (HCC)   MDD (major depressive disorder), recurrent episode, mild   MCI (mild cognitive impairment)   Malnutrition of moderate degree  Brief Hospital Course Summary: Mark [name], Mark [name]. is an 83 year old male with history of GERD, paroxysmal atrial fibrillation, on chronic anticoagulation with Eliquis , hyperlipidemia, factor V Leyden, who presents to ED from PCP for chief concerns of intractable nausea and vomiting.   Intractable nausea and vomiting- EGD done 10/01, no identifiable cause for his symptoms, Seen by GI who signed off without further recommendations. 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 - Discussed with Dr.Vaught, ENT - completed recommended trial of high dose steroids, follow up outpatient - recommend vestibular therapy outpatient  - Tolerating regular diet, encourage small portions multiple times of the day - Ondansetron  prn, Compazine prn - stopping macrobid  as may be contributing to his symptoms   Hypokalemia - Resolved - monitor and replete as needed   PAF (paroxysmal  atrial fibrillation) (HCC) - continue Eliquis    Hyperlipidemia Simvastatin  40 mg daily   GERD (gastroesophageal reflux disease) PPI    Recurrent UTI- has been on nightly macrobid  for a year for frequent UTIs. Although his symptoms of presentation have only been present since August, he may be more sensitive to the side effects of the medication with aging and prolonged use so have discussed risks and benefits with his wife about discontinuing this and given the severity of his symptoms for dizziness and nausea/vomiting, we are going to stop the macrobid  and trial off of it to see if his symptoms improve in a few days.  - they are interested in starting a cranberry supplement for ppx following dc instead of the macrobid .   All other chronic conditions were treated with home medications.    Discharge Condition: Stable, improved Recommended discharge diet: Regular healthy diet  Consultations: ENT GI  Procedures/Studies: EGD  Allergies as of 12/31/2023       Reactions   Penicillins Swelling, Rash   Has patient had a PCN reaction causing immediate rash, facial/tongue/throat swelling, SOB or lightheadedness with hypotension: Yes Has patient had a PCN reaction causing severe rash involving mucus membranes or skin necrosis: No Has patient had a PCN reaction that required hospitalization: Unknown Has patient had a PCN reaction occurring within the last 10 years: No If all of the above answers are NO, then may proceed with Cephalosporin use. Product containing penicillin (product)   Donepezil Other (See Comments)   Hallucination, confusion   Ciprofloxacin Other (See Comments)   Erythromycin Swelling   Keflex [cephalexin] Swelling   Penicillamine Other (See Comments)        Medication List     STOP taking these medications    docusate sodium  100  MG capsule Commonly known as: COLACE   gabapentin 300 MG capsule Commonly known as: NEURONTIN   nitrofurantoin   (macrocrystal-monohydrate) 100 MG capsule Commonly known as: MACROBID    promethazine  25 MG suppository Commonly known as: PHENERGAN        TAKE these medications    B-12 5000 MCG Caps Take 5,000 mg by mouth daily.   betamethasone  dipropionate 0.05 % cream Apply 1 Application topically 2 (two) times daily as needed.   buPROPion  75 MG tablet Commonly known as: WELLBUTRIN  Take 75 mg by mouth daily with breakfast.   cholecalciferol 10 MCG (400 UNIT) Tabs tablet Commonly known as: VITAMIN D3 Take 400 Units by mouth daily.   CO Q 10 PO Take 1 Dose by mouth daily.   Cranberry 250 MG Tabs Take 1 tablet (250 mg total) by mouth daily.   Eliquis  2.5 MG Tabs tablet Generic drug: apixaban  Take 1 tablet by mouth every 12 (twelve) hours.   esomeprazole 40 MG capsule Commonly known as: NEXIUM Take 1 capsule by mouth daily.   finasteride  5 MG tablet Commonly known as: PROSCAR  TAKE 1 TABLET BY MOUTH DAILY   FLUoxetine  40 MG capsule Commonly known as: PROZAC  TAKE 2 CAPSULES (80MG  TOTAL) BY MOUTH ONCE DAILY   folic acid  1 MG tablet Commonly known as: FOLVITE  Take 1 mg by mouth daily.   meclizine  25 MG tablet Commonly known as: ANTIVERT  Take 1 tablet (25 mg total) by mouth 3 (three) times daily as needed for dizziness.   memantine  5 MG tablet Commonly known as: NAMENDA  5 mg 2 (two) times daily.   niacinamide 100 MG tablet Take 100 mg by mouth in the morning.   ondansetron  4 MG disintegrating tablet Commonly known as: ZOFRAN -ODT Take 1 tablet (4 mg total) by mouth every 8 (eight) hours as needed for nausea or vomiting.   ondansetron  4 MG tablet Commonly known as: ZOFRAN  Take 4 mg by mouth every 8 (eight) hours as needed.   polyethylene glycol 17 g packet Commonly known as: MIRALAX / GLYCOLAX Take 17 g by mouth daily as needed for moderate constipation.   senna 8.6 MG Tabs tablet Commonly known as: SENOKOT Take 1 tablet (8.6 mg total) by mouth daily as needed for  mild constipation.   simvastatin  40 MG tablet Commonly known as: ZOCOR  Take 40 mg by mouth daily.   tamsulosin  0.4 MG Caps capsule Commonly known as: FLOMAX  Take 1 capsule (0.4 mg total) by mouth daily.   triamcinolone  0.1 % paste Commonly known as: KENALOG 1 Application 2 (two) times daily.         Subjective   Pt reports feeling stable. When he is laying with no movement his symptoms are resolved. He still has dizziness and nausea with head/eye movements. Has been able to tolerate a diet.   All questions and concerns were addressed at time of discharge.  Objective  Blood pressure 117/69, pulse 78, temperature 98.9 F (37.2 C), resp. rate 16, height 6' 4 (1.93 m), weight 94.8 kg, SpO2 94%.   General: Pt is alert, awake, not in acute distress Cardiovascular: RRR, S1/S2 +, no rubs, no gallops Respiratory: CTA bilaterally, no wheezing, no rhonchi Abdominal: Soft, NT, ND, bowel sounds + Extremities: no edema, no cyanosis  The results of significant diagnostics from this hospitalization (including imaging, microbiology, ancillary and laboratory) are listed below for reference.   Imaging studies: DG Chest Port 1 View Result Date: 12/21/2023 CLINICAL DATA:  Nausea vomiting EXAM: PORTABLE CHEST 1 VIEW COMPARISON:  08/02/2022  FINDINGS: No acute airspace disease or effusion. Stable cardiomediastinal silhouette. No pneumothorax. Mild coarse chronic interstitial opacities. Advanced right shoulder degenerative change IMPRESSION: No active disease. Mild chronic interstitial changes. Electronically Signed   By: Luke Bun M.D.   On: 12/21/2023 20:06    Labs: Basic Metabolic Panel: Recent Labs  Lab 12/25/23 0320  NA 137  K 4.0  CL 103  CO2 23  GLUCOSE 125*  BUN 18  CREATININE 0.81  CALCIUM 8.8*   CBC:    Latest Ref Rng & Units 12/24/2023    4:18 AM 12/23/2023    2:24 AM 12/22/2023    5:37 AM  CBC  WBC 4.0 - 10.5 K/uL  5.4  7.2   Hemoglobin 13.0 - 17.0 g/dL 85.5  87.0   85.7   Hematocrit 39.0 - 52.0 %  39.1  42.5   Platelets 150 - 400 K/uL  197  226    Microbiology: Results for orders placed or performed in visit on 10/06/22  Microscopic Examination     Status: Abnormal   Collection Time: 10/06/22  3:48 PM   Urine  Result Value Ref Range Status   WBC, UA 0-5 0 - 5 /hpf Final   RBC, Urine 0-2 0 - 2 /hpf Final   Epithelial Cells (non renal) 0-10 0 - 10 /hpf Final   Mucus, UA Present (A) Not Estab. Final   Bacteria, UA Many (A) None seen/Few Final    Time coordinating discharge: Over 30 minutes  Marien LITTIE Piety, MD  [facility] 12/31/2023, 9:58 AM

## 2023-12-31 NOTE — TOC Transition Note (Signed)
 Transition of Care [facility]) - Discharge Note   Patient Details  Name: Mark [name]. MRN: [id number] Date of Birth: 1940-10-06  Transition of [facility] CM/SW Contact:  Alvaro Louder, LCSW Phone Number: 12/31/2023, 11:56 AM   Clinical Narrative:   LCSWA received insurance approval for patient to admit to SNF [facility]. LCSWA confirmed with MD that patient is stable for discharge. LCSWA notified the patient and they are in agreement with discharge. LCSWA confirmed bed is available at SNF. Transport arranged with [facility] for next available.  [phone] RM 110   TOC Signing off     Final next level of care: Skilled Nursing Facility Barriers to Discharge: No Barriers Identified   Patient Goals and CMS Choice            Discharge Placement              Patient chooses bed at: [facility] Patient to be transferred to facility by: [facility] Name of family member notified: [name] Patient and family notified of of transfer: 12/31/23  Discharge Plan and Services Additional resources added to the After Visit Summary for                                       Social Drivers of Health (SDOH) Interventions SDOH Screenings   Food Insecurity: No Food Insecurity (12/21/2023)  Housing: Low Risk  (12/21/2023)  Transportation Needs: No Transportation Needs (12/21/2023)  Utilities: Not At Risk (12/21/2023)  Depression (PHQ2-9): High Risk (07/27/2023)  Financial Resource Strain: Low Risk  (10/19/2023)   Received [facility]  Physical Activity: Insufficiently Active (04/20/2018)  Social Connections: Moderately Isolated (12/21/2023)  Stress: No Stress Concern Present (04/20/2018)  Tobacco Use: Medium Risk (12/22/2023)     Readmission Risk Interventions     No data to display

## 2023-12-31 NOTE — Plan of Care (Signed)
  Problem: Clinical Measurements: Goal: Ability to maintain clinical measurements within normal limits will improve Outcome: Progressing   Problem: Pain Managment: Goal: General experience of comfort will improve and/or be controlled Outcome: Progressing   Problem: Safety: Goal: Ability to remain free from injury will improve Outcome: Progressing

## 2023-12-31 NOTE — Progress Notes (Signed)
 Called [facility] x 2 to give report about the patient on given no: [phone], Nurse did not answer the call. Tam, Receptionist stated [facility] will call back to primary nurse when they are available. Primary nurse phone no provided [phone].

## 2023-12-31 NOTE — Progress Notes (Incomplete)
 PROGRESS NOTE Mark Mark [name].  FMW:[id number] DOB: 1940-08-09 DOA: 12/21/2023 PCP: Mark Manna, MD  Chief Complaint  Patient presents with   Emesis   Hospital Course:  Mark Mark [name], Mark [name]. is an 83 year old male with history of GERD, paroxysmal atrial fibrillation, on chronic anticoagulation with Eliquis , hyperlipidemia, factor V Leyden, who presents to ED from PCP for chief concerns of intractable nausea and vomiting.  Subjective: Patient was examined, wife at the bedside. He feels alright today. Had nausea with moving in bed when they changed his sheets but no vomiting. Does not feel any significant improvement since yesterday.    Objective: Vitals:   12/30/23 0739 12/30/23 1444 12/30/23 1920 12/31/23 0449  BP: (!) 109/55 106/60 (!) 118/56 (!) 125/58  Pulse: 67 74 72 72  Resp: 16 16 18 18   Temp: 98.1 F (36.7 C) 97.8 F (36.6 C) 97.8 F (36.6 C) 98.7 F (37.1 C)  TempSrc: Oral   Oral  SpO2: 96% 96% 93% 96%  Weight:      Height:        Intake/Output Summary (Last 24 hours) at 12/31/2023 0728 Last data filed at 12/31/2023 0500 Gross per 24 hour  Intake 480 ml  Output 1250 ml  Net -770 ml   Filed Weights   12/21/23 1445  Weight: 94.8 kg   Examination: Constitutional: appears age-appropriate, frail, NAD, calm. Hard of hearing Respiratory: Normal respiratory effort. No accessory muscle use.  Skin: no rashes, lesions, ulcers on exposed skin Neurologic: Sensation intact. Strength 5/5 in all 4.   Assessment & Plan:  Intractable nausea and vomiting- EGD done 10/01, no identifiable cause for his symptoms, Seen by GI. 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- MRI brain 08/25 no acute abnormality - Orthostatic vitals negative in sitting and lying position - Symptoms better with meclizine  trial - Discussed with Dr.Vaught, ENT - trial of prednisone taper 60mg  over 6 days until 10/07, follow up outpatient - recommend vestibular therapy outpatient  - Tolerating regular diet,  encourage small portions multiple times of the day - Ondansetron  prn, Compazine prn - stopping macrobid  as a trial to see if contributing to his symptoms  Hypokalemia - Resolved - monitor and replete as needed   PAF (paroxysmal atrial fibrillation) (HCC) - Eliquis    Hyperlipidemia Simvastatin  40 mg daily   GERD (gastroesophageal reflux disease) PPI   Recurrent UTI- has been on nightly macrobid  for a year for frequent UTIs. Although his symptoms of presentation have only been present since August, he may be more sensitive to the side effects of the medication with aging and prolonged use so have discussed risks and benefits with his wife about discontinuing this and given the severity of his symptoms for dizziness and nausea/vomiting, we are going to stop the macrobid  and trial off of it to see if his symptoms improve in a few days.  - they are interested in starting a cranberry supplement for ppx following dc instead of the macrobid .   Constipation- last BM today - bowel regimen  - PT/OT eval - rec SNF, may benefit from vestibular rehab  DVT prophylaxis: Eliquis    Code Status: Full Code  Disposition:  SNF - pending placement  Consultants:  GI ENT  Procedures:  EGD  Data Reviewed: I have personally reviewed following labs and imaging studies CBC: No results for input(s): WBC, NEUTROABS, HGB, HCT, MCV, PLT in the last 168 hours.  Basic Metabolic Panel: Recent Labs  Lab 12/25/23 0320  NA 137  K 4.0  CL 103  CO2 23  GLUCOSE 125*  BUN 18  CREATININE 0.81  CALCIUM 8.8*    LOS: 10 days  MDM: Patient is high risk for one or more organ failure.  They necessitate ongoing hospitalization for continued IV therapies and subsequent lab monitoring. Total time spent interpreting labs and vitals, reviewing the medical record, coordinating care amongst consultants and care team members, directly assessing and discussing care with the patient and/or family: 55  min  Mark LITTIE Piety, MD [facility]  To contact the attending physician between 7A-7P please use Epic Chat. To contact the covering physician during after hours 7P-7A, please review Amion.  12/31/2023, 7:28 AM

## 2024-01-18 ENCOUNTER — Ambulatory Visit: Payer: Self-pay | Admitting: Urology

## 2024-02-20 ENCOUNTER — Emergency Department

## 2024-02-20 ENCOUNTER — Emergency Department
Admission: EM | Admit: 2024-02-20 | Discharge: 2024-02-23 | Disposition: A | Discharge status: Home / Self Care | Attending: Emergency Medicine | Admitting: Emergency Medicine

## 2024-02-20 ENCOUNTER — Other Ambulatory Visit: Payer: Self-pay

## 2024-02-20 DIAGNOSIS — M6281 Muscle weakness (generalized): Secondary | ICD-10-CM | POA: Insufficient documentation

## 2024-02-20 DIAGNOSIS — R2681 Unsteadiness on feet: Secondary | ICD-10-CM | POA: Insufficient documentation

## 2024-02-20 DIAGNOSIS — N390 Urinary tract infection, site not specified: Secondary | ICD-10-CM | POA: Insufficient documentation

## 2024-02-20 DIAGNOSIS — R400 Somnolence: Secondary | ICD-10-CM | POA: Diagnosis present

## 2024-02-20 DIAGNOSIS — R531 Weakness: Secondary | ICD-10-CM | POA: Insufficient documentation

## 2024-02-20 DIAGNOSIS — Z7409 Other reduced mobility: Secondary | ICD-10-CM | POA: Insufficient documentation

## 2024-02-20 DIAGNOSIS — Z9181 History of falling: Secondary | ICD-10-CM | POA: Diagnosis not present

## 2024-02-20 DIAGNOSIS — R2689 Other abnormalities of gait and mobility: Secondary | ICD-10-CM | POA: Insufficient documentation

## 2024-02-20 DIAGNOSIS — R4182 Altered mental status, unspecified: Secondary | ICD-10-CM | POA: Insufficient documentation

## 2024-02-20 DIAGNOSIS — R9082 White matter disease, unspecified: Secondary | ICD-10-CM | POA: Diagnosis not present

## 2024-02-20 DIAGNOSIS — F0390 Unspecified dementia without behavioral disturbance: Secondary | ICD-10-CM | POA: Diagnosis not present

## 2024-02-20 LAB — COMPREHENSIVE METABOLIC PANEL WITH GFR
ALT: 19 U/L (ref 0–44)
AST: 22 U/L (ref 15–41)
Albumin: 3.7 g/dL (ref 3.5–5.0)
Alkaline Phosphatase: 87 U/L (ref 38–126)
Anion gap: 12 (ref 5–15)
BUN: 12 mg/dL (ref 8–23)
CO2: 23 mmol/L (ref 22–32)
Calcium: 9.2 mg/dL (ref 8.9–10.3)
Chloride: 102 mmol/L (ref 98–111)
Creatinine, Ser: 0.84 mg/dL (ref 0.61–1.24)
GFR, Estimated: >60 mL/min (ref >60)
Glucose, Bld: 90 mg/dL (ref 70–99)
Potassium: 4 mmol/L (ref 3.5–5.1)
Sodium: 136 mmol/L (ref 135–145)
Total Bilirubin: 0.5 mg/dL (ref 0.0–1.2)
Total Protein: 6.7 g/dL (ref 6.5–8.1)

## 2024-02-20 LAB — URINALYSIS, COMPLETE (UACMP) WITH MICROSCOPIC
Bacteria, UA: NONE SEEN
Bilirubin Urine: NEGATIVE
Glucose, UA: NEGATIVE mg/dL
Hgb urine dipstick: NEGATIVE
Ketones, ur: NEGATIVE mg/dL
Leukocytes,Ua: NEGATIVE
Nitrite: NEGATIVE
Protein, ur: NEGATIVE mg/dL
Specific Gravity, Urine: 1.015 (ref 1.005–1.030)
WBC, UA: 0 WBC/hpf (ref 0–5)
pH: 6 (ref 5.0–8.0)

## 2024-02-20 LAB — CBC
HCT: 42.6 % (ref 39.0–52.0)
Hemoglobin: 14.2 g/dL (ref 13.0–17.0)
MCH: 30.7 pg (ref 26.0–34.0)
MCHC: 33.3 g/dL (ref 30.0–36.0)
MCV: 92 fL (ref 80.0–100.0)
Platelets: 290 K/uL (ref 150–400)
RBC: 4.63 MIL/uL (ref 4.22–5.81)
RDW: 12.8 % (ref 11.5–15.5)
WBC: 9.3 K/uL (ref 4.0–10.5)
nRBC: 0 % (ref 0.0–0.2)

## 2024-02-20 LAB — RESP PANEL BY RT-PCR (RSV, FLU A&B, COVID)  RVPGX2
Influenza A by PCR: NEGATIVE
Influenza B by PCR: NEGATIVE
Resp Syncytial Virus by PCR: NEGATIVE
SARS Coronavirus 2 by RT PCR: NEGATIVE

## 2024-02-20 MED ORDER — FINASTERIDE 5 MG PO TABS
5.0000 mg | ORAL_TABLET | Freq: Every day | ORAL | Status: DC
  Administered 2024-02-20 – 2024-02-22 (×3): 5 mg via ORAL
  Filled 2024-02-20 (×3): qty 1

## 2024-02-20 MED ORDER — TAMSULOSIN HCL 0.4 MG PO CAPS
0.4000 mg | ORAL_CAPSULE | Freq: Every day | ORAL | Status: DC
  Administered 2024-02-20 – 2024-02-22 (×3): 0.4 mg via ORAL
  Filled 2024-02-20 (×3): qty 1

## 2024-02-20 MED ORDER — BUPROPION HCL 75 MG PO TABS
150.0000 mg | ORAL_TABLET | Freq: Every day | ORAL | Status: DC
  Administered 2024-02-20 – 2024-02-22 (×3): 150 mg via ORAL
  Filled 2024-02-20 (×4): qty 2

## 2024-02-20 MED ORDER — MEMANTINE HCL 5 MG PO TABS
5.0000 mg | ORAL_TABLET | Freq: Two times a day (BID) | ORAL | Status: DC
  Administered 2024-02-20 – 2024-02-22 (×5): 5 mg via ORAL
  Filled 2024-02-20 (×5): qty 1

## 2024-02-20 MED ORDER — PANTOPRAZOLE SODIUM 40 MG PO TBEC
40.0000 mg | DELAYED_RELEASE_TABLET | Freq: Every day | ORAL | Status: DC
  Administered 2024-02-20 – 2024-02-22 (×3): 40 mg via ORAL
  Filled 2024-02-20 (×3): qty 1

## 2024-02-20 MED ORDER — SIMVASTATIN 10 MG PO TABS
40.0000 mg | ORAL_TABLET | Freq: Every day | ORAL | Status: DC
  Administered 2024-02-20 – 2024-02-22 (×3): 40 mg via ORAL
  Filled 2024-02-20 (×3): qty 4

## 2024-02-20 MED ORDER — LACTATED RINGERS IV BOLUS
1000.0000 mL | Freq: Once | INTRAVENOUS | Status: AC
  Administered 2024-02-20: 1000 mL via INTRAVENOUS

## 2024-02-20 MED ORDER — APIXABAN 2.5 MG PO TABS
2.5000 mg | ORAL_TABLET | Freq: Two times a day (BID) | ORAL | Status: DC
  Administered 2024-02-20 – 2024-02-22 (×5): 2.5 mg via ORAL
  Filled 2024-02-20 (×5): qty 1

## 2024-02-20 NOTE — ED Provider Notes (Signed)
 [facility] Provider Note    Event Date/Time   First MD Initiated Contact with Patient 02/20/24 [id number]     (approximate)  History   Chief Complaint: Urinary Tract Infection  HPI  Jaymes Hang. is a 83 y.o. male with a past medical history of dementia, hyperlipidemia, gastric reflux, atrial fibrillation, presents to the emergency department for worsening somnolence.  According to the wife patient has dementia, however over the past week or 2 has been less active has been more weak and has been unable to help with transfers over the last couple days which is atypical.  She recently brought him to his doctor who tested his urine and the culture came back positive for UTI.  Started antibiotics on Friday received 2 doses of antibiotics Friday (Bactrim ), 2 doses yesterday.  Wife states the patient still seemed more confused and weak today so she had him brought to the emergency department for evaluation.  No known fever.  Wife denies any vomiting or diarrhea.  No cough.  Physical Exam   Triage Vital Signs: ED Triage Vitals  Encounter Vitals Group     BP 02/20/24 0657 (!) 140/116     Girls Systolic BP Percentile --      Girls Diastolic BP Percentile --      Boys Systolic BP Percentile --      Boys Diastolic BP Percentile --      Pulse Rate 02/20/24 0657 85     Resp 02/20/24 0657 18     Temp 02/20/24 0657 98.7 F (37.1 C)     Temp Source 02/20/24 0657 Oral     SpO2 02/20/24 0657 95 %     Weight 02/20/24 0701 240 lb (108.9 kg)     Height 02/20/24 0701 6' 4 (1.93 m)     Head Circumference --      Peak Flow --      Pain Score 02/20/24 0701 0     Pain Loc --      Pain Education --      Exclude from Growth Chart --     Most recent vital signs: Vitals:   02/20/24 0657  BP: (!) 140/116  Pulse: 85  Resp: 18  Temp: 98.7 F (37.1 C)  SpO2: 95%    General: Awake, no distress.  CV:  Good peripheral perfusion.  Regular rate and rhythm  Resp:  Normal effort.   Equal breath sounds bilaterally.  Abd:  No distention.  Soft, nontender.  No rebound or guarding.  ED Results / Procedures / Treatments   RADIOLOGY  I have reviewed interpret the CT images.  No obvious bleed or significant abnormality seen on my evaluation. Radiology has read the CT scan as negative for acute abnormality.   MEDICATIONS ORDERED IN ED: Medications  lactated ringers  bolus 1,000 mL (has no administration in time range)     IMPRESSION / MDM / ASSESSMENT AND PLAN / ED COURSE  I reviewed the triage vital signs and the nursing notes.  Patient's presentation is most consistent with acute presentation with potential threat to life or bodily function.  Patient presents emergency department for generalized weakness increased confusion.  Patient has a history of dementia but the wife feels that the symptoms have been worsening over the past 2 weeks and then acutely worse over the last 2 days.  Patient currently on Bactrim  for a UTI.  We will check labs including a CBC chemistry urine we will obtain a CT  scan of the head we will obtain a COVID swab IV hydrate while awaiting results.  Patient's workup is overall reassuring CBC is normal, chemistry is normal, respiratory panel negative.  Urinalysis is normal.  CT scan of the head is negative.  We attempted to get the patient up after IV hydration patient is still requiring a two-person assist and the wife does not believe they can safely go home as the patient is too weak to help with transfers.  Wife describes a progressive decline in functional status over the past 1 year that actually had an appointment coming up this week with neurology to discuss his decline.  Wife states a more abrupt decline over the past week or so to the point where the patient is no longer able to help with transfers.  They have some home health care but it is sporadic.  The wife does not believe she can adequately care for the patient at home and in state.   However given his reassuring medical workup I do not see any clear need to admit to the hospital.  Will have social work and physical therapy evaluate as the patient would likely benefit from a rehab placement for physical therapy and strength training.  I suspect this could be more related to her chronic cognitive disorder such as dementia although the patient has not been formally diagnosed  FINAL CLINICAL IMPRESSION(S) / ED DIAGNOSES   Weakness  Note:  This document was prepared using Dragon voice recognition software and may include unintentional dictation errors.   Dorothyann Drivers, MD 02/20/24 1234

## 2024-02-20 NOTE — ED Triage Notes (Signed)
 [facility] for c/o urinary tract infection x 1 week. P/t also has hallucinations x a couple weeks

## 2024-02-20 NOTE — Evaluation (Addendum)
 Physical Therapy Evaluation Patient Details Name: Mark [name]. MRN: [id number] DOB: 1940-05-27 Today's Date: 02/20/2024  History of Present Illness  Mark [name]. is a 83 y.o. male with a past medical history of dementia, hyperlipidemia, gastric reflux, atrial fibrillation, presents to the emergency department for worsening somnolence.  According to the wife patient has dementia, however over the past week or 2 has been less active has been more weak and has been unable to help with transfers over the last couple days which is atypical. Patient here with UTI.   Clinical Impression  Patient received in bed, wife at bedside. Patient is pleasantly confused, HOH. Wife provides prior history information. Patient is impulsive with mobility requiring cues to take his time and slow down for safety. He required min/mod A for bed mobility and min A to stand however requires mod A initially to maintain standing balance. Posterior leaning and required seated break. On second attempt with cues for safe hand placement patient is able to achieve standing balance with walker. He was able to take a few side steps at edge of bed but easily fatigued with this. Patient will continue to benefit from skilled PT to improve strength and safety with mobility.             If plan is discharge home, recommend the following: A lot of help with walking and/or transfers;A lot of help with bathing/dressing/bathroom   Can travel by private vehicle   No    Equipment Recommendations None recommended by PT  Recommendations for Other Services       Functional Status Assessment Patient has had a recent decline in their functional status and demonstrates the ability to make significant improvements in function in a reasonable and predictable amount of time.     Precautions / Restrictions Precautions Precautions: Fall Recall of Precautions/Restrictions: Impaired Restrictions Weight Bearing Restrictions Per Provider  Order: No      Mobility  Bed Mobility Overal bed mobility: Needs Assistance Bed Mobility: Supine to Sit, Sit to Supine     Supine to sit: Mod assist, HOB elevated Sit to supine: Min assist   General bed mobility comments: patient is slightly impulsive with mobility. Requires cues to wait and sit for a minute before attempting to stand. Required mod A to get legs around and feet on floor.    Transfers Overall transfer level: Needs assistance Equipment used: Rolling walker (2 wheels) Transfers: Sit to/from Stand Sit to Stand: Min assist           General transfer comment: posterior leaning with initial attempt. Second attempt patient was able to achieve good balance standing with RW. Requires cues for safe hand placement    Ambulation/Gait Ambulation/Gait assistance: Min assist Gait Distance (Feet): 2 Feet Assistive device: Rolling walker (2 wheels) Gait Pattern/deviations: Step-to pattern, Decreased step length - right, Decreased step length - left, Decreased stride length       General Gait Details: side steps at edge of bed  Stairs            Wheelchair Mobility     Tilt Bed    Modified Rankin (Stroke Patients Only)       Balance Overall balance assessment: Needs assistance Sitting-balance support: Feet supported Sitting balance-Leahy Scale: Good   Postural control: Posterior lean Standing balance support: Bilateral upper extremity supported, During functional activity, Reliant on assistive device for balance Standing balance-Leahy Scale: Fair Standing balance comment: posterior leaning initially  Pertinent Vitals/Pain Pain Assessment Pain Assessment: No/denies pain    Home Living Family/patient expects to be discharged to:: Private residence Living Arrangements: Spouse/significant other. Patient also has private aide 2x per week.  Available Help at Discharge: Available 24 hours/day Type of Home:  House Home Access: Ramped entrance       Home Layout: One level Home Equipment: Agricultural Consultant (2 wheels);Cane - single Tax Inspector (4 wheels);Wheelchair - manual      Prior Function Prior Level of Function : Needs assist       Physical Assist : Mobility (physical);ADLs (physical)     Mobility Comments: Pt uses transport chair at baseline. Pt's family assists with transfers without use of other AD. Pt's spouse reports working with [facility] PT in the past but has not seen progress d/t pt intermittent illness. Pt's spouse reports multiple falls in the past 6 months d/t LOB ADLs Comments: Pt requires assistance with ADLs. Pt's spouse reports spongebathing at baseline.     Extremity/Trunk Assessment   Upper Extremity Assessment Upper Extremity Assessment: Generalized weakness    Lower Extremity Assessment Lower Extremity Assessment: Generalized weakness    Cervical / Trunk Assessment Cervical / Trunk Assessment: Normal  Communication   Communication Communication: Impaired Factors Affecting Communication: Hearing impaired;Reduced clarity of speech    Cognition Arousal: Alert Behavior During Therapy: Anxious   PT - Cognitive impairments: History of cognitive impairments, Problem solving, Safety/Judgement, Memory, Attention, Orientation   Orientation impairments: Time, Situation                     Following commands: Impaired Following commands impaired: Follows one step commands with increased time, Follows one step commands inconsistently     Cueing Cueing Techniques: Verbal cues, Gestural cues     General Comments      Exercises     Assessment/Plan    PT Assessment Patient needs continued PT services  PT Problem List Decreased strength;Decreased activity tolerance;Decreased balance;Decreased mobility;Decreased safety awareness;Decreased knowledge of use of DME;Decreased cognition       PT Treatment Interventions DME  instruction;Functional mobility training;Therapeutic activities;Therapeutic exercise;Balance training;Neuromuscular re-education;Cognitive remediation;Patient/family education    PT Goals (Current goals can be found in the Care Plan section)  Acute Rehab PT Goals Patient Stated Goal: go to rehab PT Goal Formulation: With family Time For Goal Achievement: 03/05/24 Potential to Achieve Goals: Good    Frequency Min 2X/week     Co-evaluation               AM-PAC PT 6 Clicks Mobility  Outcome Measure Help needed turning from your back to your side while in a flat bed without using bedrails?: A Lot Help needed moving from lying on your back to sitting on the side of a flat bed without using bedrails?: A Lot Help needed moving to and from a bed to a chair (including a wheelchair)?: A Lot Help needed standing up from a chair using your arms (e.g., wheelchair or bedside chair)?: A Little Help needed to walk in hospital room?: Total Help needed climbing 3-5 steps with a railing? : Total 6 Click Score: 11    End of Session   Activity Tolerance: Patient limited by fatigue Patient left: in bed;with call bell/phone within reach;with family/visitor present Nurse Communication: Mobility status PT Visit Diagnosis: Unsteadiness on feet (R26.81);Other abnormalities of gait and mobility (R26.89);Muscle weakness (generalized) (M62.81);History of falling (Z91.81)    Time: 8664-8640 PT Time Calculation (min) (ACUTE ONLY): 24 min   Charges:  PT Evaluation $PT Eval Low Complexity: 1 Low PT Treatments $Therapeutic Activity: 8-22 mins PT General Charges $$ ACUTE PT VISIT: 1 Visit         Tkai Large, PT, GCS 02/20/24,2:35 PM

## 2024-02-21 NOTE — TOC Initial Note (Addendum)
 Transition of Care (TOC) - Initial/Assessment Note    Patient Details  Name: Mark [name]. MRN: [id number] Date of Birth: 02/09/1941  Transition of Care [facility]) CM/SW Contact:    Delphine KANDICE Bring, RN Phone Number: 02/21/2024, 11:43 AM  Clinical Narrative:                 Wife [name] at bedside. She state that they are on the waiting list for [facility] for residence. She would like for [facility] to go to [facility]. She states that she has been in contact with Alfonso Lipoma at the facility. CM explained the process.  CM Completed FL@ waiting for MD signature. PASRR completed but they are requesting more documentation. Referral sent to [facility]   Prior to ED patient had [facility] for Nursing and PT  1435   CM spoke with Alfonso at [facility].  They will accept patient. PASRR is pending   Expected Discharge Plan: Skilled Nursing Facility     Patient Goals and CMS Choice            Expected Discharge Plan and Services       Living arrangements for the past 2 months: Single Family Home                                      Prior Living Arrangements/Services Living arrangements for the past 2 months: Single Family Home Lives with:: Spouse              Current home services: Home PT, Other (comment) (private aid that assist in the home)    Activities of Daily Living      Permission Sought/Granted                  Emotional Assessment       Orientation: : Oriented to Self      Admission diagnosis:  uti Patient Active Problem List   Diagnosis Date Noted   Malnutrition of moderate degree 12/24/2023   Intractable nausea and vomiting 12/21/2023   Hx of psychosis 09/28/2022   Benign prostatic hyperplasia 08/08/2022   Acute respiratory failure with hypoxia (HCC) 08/02/2022   UTI (urinary tract infection) 08/02/2022   Psychosis (HCC) 01/22/2022   At risk for prolonged QT interval syndrome 01/22/2022   MCI (mild cognitive impairment) 07/21/2021    COVID-19 10/14/2020   MDD (major depressive disorder), recurrent, in full remission 07/12/2020   MDD (major depressive disorder), recurrent episode, mild 11/04/2018   Insomnia due to medical condition 11/04/2018   Fall 03/02/2018   Pleural effusion    Weakness 02/22/2018   Nausea 02/21/2018   Near syncope 10/24/2017   Orthostatic hypotension 10/24/2017   History of pulmonary embolus (PE) 08/12/2017   PAF (paroxysmal atrial fibrillation) (HCC) 08/12/2017   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   Abnormal cardiovascular function study 01/07/2017   Lightheadedness 12/23/2016   Lower extremity numbness 12/23/2016   Orthostatic dizziness 12/23/2016   Weakness of both lower extremities 12/23/2016   Arthritis 11/17/2016   SOB (shortness of breath) 11/16/2016   DDD (degenerative disc disease), lumbar 07/01/2016   Lumbosacral spondylosis without myelopathy 01/07/2016   Chronic bilateral low back pain without sciatica 10/10/2015   Primary osteoarthritis of right shoulder 06/25/2015   H/O adenomatous polyp of colon 07/20/2014   Chronic anticoagulation 10/04/2012   Anxiety 09/11/2011   Cyst of left kidney 09/11/2011  GERD (gastroesophageal reflux disease) 09/11/2011   Heart murmur 09/11/2011   Hyperlipidemia 09/11/2011   Neuropathy 09/11/2011   Pulmonary embolus (HCC) 09/11/2011   Back pain, chronic 09/11/2011   PCP:  Sadie Manna, MD Pharmacy:   [facility] - [place], [place] - [address] [place] [address] Phone: [phone] Fax: [phone]     Social Drivers of Health (SDOH) Social History: SDOH Screenings   Food Insecurity: No Food Insecurity (12/21/2023)  Housing: Low Risk  (12/21/2023)  Transportation Needs: No Transportation Needs (12/21/2023)  Utilities: Not At Risk (12/21/2023)  Depression (PHQ2-9): High Risk (07/27/2023)  Financial Resource Strain: Low Risk  (10/19/2023)   Received from [facility]  Physical Activity: Insufficiently Active (04/20/2018)  Social Connections: Moderately Isolated (12/21/2023)  Stress: No Stress Concern Present (04/20/2018)  Tobacco Use: Medium Risk (12/22/2023)   SDOH Interventions:     Readmission Risk Interventions     No data to display

## 2024-02-21 NOTE — ED Notes (Signed)
 Pt obtained another skin avulsion proximal to his avulsion of his posterior left forearm, unsure as to what caused it. Non-adherent gauze and Coban applied.

## 2024-02-21 NOTE — ED Provider Notes (Signed)
-----------------------------------------   11:38 AM on 02/21/2024 -----------------------------------------   Blood pressure 93/60, pulse 81, temperature 97.8 F (36.6 C), temperature source Oral, resp. rate 19, height 6' 4 (1.93 m), weight 108.9 kg, SpO2 95%.  The patient is calm and cooperative at this time.  There have been no acute events since the last update.  Awaiting disposition plan from case management/social work.    Suzanne Kirsch, MD 02/21/24 [phone]

## 2024-02-21 NOTE — ED Notes (Signed)
 Unable to complete getting pt's orthostatic VS due to put inability to stand to get bp.

## 2024-02-21 NOTE — NC FL2 (Signed)
 [place]  MEDICAID FL2 LEVEL OF CARE FORM     IDENTIFICATION  Patient Name: Mark [name]. Birthdate: [birth year] Sex: male Admission Date (Current Location): 02/20/2024  [facility] and [place] Number:  Chiropodist and Address:  [facility], [address], [place], [place] [address]      Provider Number: [id number]  Attending Physician Name and Address:  Suzanne Kirsch, MD  Relative Name and Phone Number:  [name] [phone]    Current Level of Care: Hospital Recommended Level of Care: Skilled Nursing Facility Prior Approval Number:    Date Approved/Denied:   PASRR Number: [id number] pending  Discharge Plan: SNF    Current Diagnoses: Patient Active Problem List   Diagnosis Date Noted   Malnutrition of moderate degree 12/24/2023   Intractable nausea and vomiting 12/21/2023   Hx of psychosis 09/28/2022   Benign prostatic hyperplasia 08/08/2022   Acute respiratory failure with hypoxia (HCC) 08/02/2022   UTI (urinary tract infection) 08/02/2022   Psychosis (HCC) 01/22/2022   At risk for prolonged QT interval syndrome 01/22/2022   MCI (mild cognitive impairment) 07/21/2021   COVID-19 10/14/2020   MDD (major depressive disorder), recurrent, in full remission 07/12/2020   MDD (major depressive disorder), recurrent episode, mild 11/04/2018   Insomnia due to medical condition 11/04/2018   Fall 03/02/2018   Pleural effusion    Weakness 02/22/2018   Nausea 02/21/2018   Near syncope 10/24/2017   Orthostatic hypotension 10/24/2017   History of pulmonary embolus (PE) 08/12/2017   PAF (paroxysmal atrial fibrillation) (HCC) 08/12/2017   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   Abnormal cardiovascular function study 01/07/2017   Lightheadedness 12/23/2016   Lower extremity numbness 12/23/2016   Orthostatic dizziness 12/23/2016   Weakness of both lower extremities 12/23/2016   Arthritis 11/17/2016   SOB (shortness of breath)  11/16/2016   DDD (degenerative disc disease), lumbar 07/01/2016   Lumbosacral spondylosis without myelopathy 01/07/2016   Chronic bilateral low back pain without sciatica 10/10/2015   Primary osteoarthritis of right shoulder 06/25/2015   H/O adenomatous polyp of colon 07/20/2014   Chronic anticoagulation 10/04/2012   Anxiety 09/11/2011   Cyst of left kidney 09/11/2011   GERD (gastroesophageal reflux disease) 09/11/2011   Heart murmur 09/11/2011   Hyperlipidemia 09/11/2011   Neuropathy 09/11/2011   Pulmonary embolus (HCC) 09/11/2011   Back pain, chronic 09/11/2011    Orientation RESPIRATION BLADDER Height & Weight     Self  Normal Incontinent Weight: 108.9 kg Height:  6' 4 (193 cm)  BEHAVIORAL SYMPTOMS/MOOD NEUROLOGICAL BOWEL NUTRITION STATUS      Incontinent Diet (reg w/ fluid consistency thin)  AMBULATORY STATUS COMMUNICATION OF NEEDS Skin   Extensive Assist Verbally Normal                       Personal Care Assistance Level of Assistance  Bathing, Feeding, Dressing Bathing Assistance: Limited assistance Feeding assistance: Limited assistance Dressing Assistance: Limited assistance     Functional Limitations Info  Sight, Hearing, Speech Sight Info: Adequate Hearing Info: Adequate Speech Info: Adequate    SPECIAL CARE FACTORS FREQUENCY  PT (By licensed PT), OT (By licensed OT)     PT Frequency: 5x/ week OT Frequency: 5x/ week            Contractures Contractures Info: Not present    Additional Factors Info  Code Status, Allergies Code Status Info: Full Code Allergies Info: Ceftrianxone, Erythromycin, Penicillian, Donepezil, Ciprofloxacin, Kelfex  Current Medications (02/21/2024):  This is the current hospital active medication list Current Facility-Administered Medications  Medication Dose Route Frequency Provider Last Rate Last Admin   apixaban  (ELIQUIS ) tablet 2.5 mg  2.5 mg Oral Q12H Paduchowski, Kevin, MD   2.5 mg at 02/21/24 0945    buPROPion  (WELLBUTRIN ) tablet 150 mg  150 mg Oral Daily Paduchowski, Kevin, MD   150 mg at 02/21/24 0945   finasteride  (PROSCAR ) tablet 5 mg  5 mg Oral Daily Paduchowski, Kevin, MD   5 mg at 02/21/24 9049   memantine  (NAMENDA ) tablet 5 mg  5 mg Oral BID Paduchowski, Kevin, MD   5 mg at 02/21/24 0945   nystatin  (MYCOSTATIN /NYSTOP ) topical powder   Topical BID Penne Knee, MD       pantoprazole  (PROTONIX ) EC tablet 40 mg  40 mg Oral Daily Paduchowski, Kevin, MD   40 mg at 02/21/24 0945   simvastatin  (ZOCOR ) tablet 40 mg  40 mg Oral Daily Paduchowski, Kevin, MD   40 mg at 02/21/24 0945   tamsulosin  (FLOMAX ) capsule 0.4 mg  0.4 mg Oral Daily Paduchowski, Kevin, MD   0.4 mg at 02/21/24 0945   Current Outpatient Medications  Medication Sig Dispense Refill   apixaban  (ELIQUIS ) 2.5 MG TABS tablet Take 1 tablet by mouth every 12 (twelve) hours.     buPROPion  (WELLBUTRIN  XL) 150 MG 24 hr tablet Take 150 mg by mouth daily.     cholecalciferol (VITAMIN D3) 10 MCG (400 UNIT) TABS tablet Take 400 Units by mouth daily.     Coenzyme Q10 (CO Q 10 PO) Take 1 Dose by mouth daily.      Cranberry 250 MG TABS Take 1 tablet (250 mg total) by mouth daily.     Cyanocobalamin  (B-12) 5000 MCG CAPS Take 5,000 mg by mouth daily. 30 capsule 0   esomeprazole (NEXIUM) 40 MG capsule Take 1 capsule by mouth daily.     finasteride  (PROSCAR ) 5 MG tablet TAKE 1 TABLET BY MOUTH DAILY 90 tablet 3   FLUoxetine  (PROZAC ) 40 MG capsule TAKE 2 CAPSULES (80MG  TOTAL) BY MOUTH ONCE DAILY 180 capsule 3   folic acid  (FOLVITE ) 1 MG tablet Take 1 mg by mouth daily.     meclizine  (ANTIVERT ) 25 MG tablet Take 1 tablet (25 mg total) by mouth 3 (three) times daily as needed for dizziness.     memantine  (NAMENDA ) 5 MG tablet Take 5 mg by mouth 2 (two) times daily.     polyethylene glycol (MIRALAX  / GLYCOLAX ) 17 g packet Take 17 g by mouth daily as needed for moderate constipation.     senna (SENOKOT) 8.6 MG TABS tablet Take 1 tablet (8.6 mg  total) by mouth daily as needed for mild constipation.     simvastatin  (ZOCOR ) 40 MG tablet Take 40 mg by mouth daily.     sulfamethoxazole -trimethoprim  (BACTRIM  DS) 800-160 MG tablet Take 1 tablet by mouth 2 (two) times daily.     tamsulosin  (FLOMAX ) 0.4 MG CAPS capsule Take 1 capsule (0.4 mg total) by mouth daily. (Patient taking differently: Take 0.8 mg by mouth daily.) 90 capsule 3   niacinamide 100 MG tablet Take 100 mg by mouth in the morning. (Patient not taking: Reported on 02/20/2024)     ondansetron  (ZOFRAN ) 4 MG tablet Take 4 mg by mouth every 8 (eight) hours as needed. (Patient not taking: Reported on 12/21/2023)     ondansetron  (ZOFRAN -ODT) 4 MG disintegrating tablet Take 1 tablet (4 mg total) by mouth every 8 (  eight) hours as needed for nausea or vomiting. (Patient not taking: Reported on 12/21/2023) 20 tablet 0     Discharge Medications: Please see discharge summary for a list of discharge medications.  Relevant Imaging Results:  Relevant Lab Results:   Additional Information SSN [id number]  Delphine KANDICE Bring, RN

## 2024-02-21 NOTE — Progress Notes (Addendum)
 RE:  Mark [name] Date of Birth: [birth year] Date:  02/21/24     To Whom It May Concern:   Please be advised that the above-named patient will require a short-term nursing home stay - anticipated 30 days or less for rehabilitation and strengthening.  The plan is for return home

## 2024-02-22 MED ORDER — FLUOXETINE HCL 20 MG PO CAPS
80.0000 mg | ORAL_CAPSULE | Freq: Every day | ORAL | Status: DC
  Administered 2024-02-22: 80 mg via ORAL
  Filled 2024-02-22: qty 4

## 2024-02-22 NOTE — ED Notes (Signed)
 Pt resting supine on the stretcher, wife at bedside, wife informed that his lunch was here and that I would get a table for him and set up his tray, wife states that she doesn't think he will want it or do anything with it, wife informed that if he changes his mind to let us  know and we would set it up

## 2024-02-22 NOTE — ED Notes (Signed)
 Pt stating need to void. Urinal placed for pt. Pt brief changed, new warm blankets provided. Pt has no other needs at this time.

## 2024-02-22 NOTE — ED Notes (Signed)
 Family at bedside with pt.

## 2024-02-22 NOTE — ED Provider Notes (Signed)
 Safe discharge established with local facility.  Will have the patient ready for transport to [facility].   Claudene Rover, MD 02/22/24 [phone]

## 2024-02-22 NOTE — TOC Transition Note (Signed)
 Transition of Care [facility]) - Discharge Note   Patient Details  Name: Mark [name]. MRN: [id number] Date of Birth: 31-Mar-1940  Transition of Care [facility]) CM/SW Contact:  Nathanael CHRISTELLA Ring, RN Phone Number: 02/22/2024, 4:06 PM   Clinical Narrative:    [name] [phone] E- good from today through 03/23/24.  Patient will discharge from the emergency room to [facility], going to room 117, ED RN will call report to [phone].  ED secretary will arrange [facility] for transport.  Wife updated and very glad that he can discharge today.    Final next level of care: Skilled Nursing Facility Barriers to Discharge: Barriers Resolved   Patient Goals and CMS Choice   CMS Medicare.gov Compare Post Acute Care list provided to:: Patient Represenative (must comment) Choice offered to / list presented to : Spouse, Adult Children      Discharge Placement PASRR number recieved: 02/22/24            Patient chooses bed at: [facility] Patient to be transferred to facility by: EMS Name of family member notified: Mark [name]- wife Patient and family notified of of transfer: 02/22/24  Discharge Plan and Services Additional resources added to the After Visit Summary for                  DME Arranged: N/A         HH Arranged: NA          Social Drivers of Health (SDOH) Interventions SDOH Screenings   Food Insecurity: No Food Insecurity (12/21/2023)  Housing: Low Risk  (12/21/2023)  Transportation Needs: No Transportation Needs (12/21/2023)  Utilities: Not At Risk (12/21/2023)  Depression (PHQ2-9): High Risk (07/27/2023)  Financial Resource Strain: Low Risk  (10/19/2023)   Received from [facility]  Physical Activity: Insufficiently Active (04/20/2018)  Social Connections: Moderately Isolated (12/21/2023)  Stress: No Stress Concern Present (04/20/2018)  Tobacco Use: Medium Risk (12/22/2023)     Readmission Risk Interventions     No data to display

## 2024-02-22 NOTE — ED Notes (Signed)
 [facility] called @3 :50 spoke with ED regarding transferring pt to [facility]

## 2024-02-22 NOTE — TOC CM/SW Note (Cosign Needed)
 Mark [name]. DOB: [birth year]  To Whom It May Concern:   Please be advised that the above-named patient will require a short-term nursing home stay - anticipated 30 days or less for rehabilitation and strengthening.  The plan is for return home.

## 2024-02-22 NOTE — TOC Progression Note (Signed)
 Transition of Care (TOC) - Progression Note    Patient Details  Name: Mark [name]. MRN: [id number] Date of Birth: 06/04/1940  Transition of Care [facility]) CM/SW Contact  Mark CHRISTELLA Ring, RN Phone Number: 02/22/2024, 9:02 AM  Clinical Narrative:    Patient has not had a qualifying stay in the past 30 days for his Medicare to cover SNF.  I called and spoke to his son Mark [name] verbalizes understanding. Patient can still go to SNF but the family will need to private pay.  Mark says this is not a problem.  Notified Mark [name] at [facility] and she will reach out to [facility] to discuss financials.  PASRR is still pending.     Expected Discharge Plan: Skilled Nursing Facility                 Expected Discharge Plan and Services       Living arrangements for the past 2 months: Single Family Home                                       Social Drivers of Health (SDOH) Interventions SDOH Screenings   Food Insecurity: No Food Insecurity (12/21/2023)  Housing: Low Risk  (12/21/2023)  Transportation Needs: No Transportation Needs (12/21/2023)  Utilities: Not At Risk (12/21/2023)  Depression (PHQ2-9): High Risk (07/27/2023)  Financial Resource Strain: Low Risk  (10/19/2023)   Received from [facility]  Physical Activity: Insufficiently Active (04/20/2018)  Social Connections: Moderately Isolated (12/21/2023)  Stress: No Stress Concern Present (04/20/2018)  Tobacco Use: Medium Risk (12/22/2023)    Readmission Risk Interventions     No data to display

## 2024-02-22 NOTE — ED Notes (Signed)
 Physical therapy with pt and wife

## 2024-02-22 NOTE — ED Provider Notes (Signed)
-----------------------------------------   6:45 AM on 02/22/2024 -----------------------------------------   Blood pressure 139/75, pulse 78, temperature 98.3 F (36.8 C), temperature source Oral, resp. rate 18, height 6' 4 (1.93 m), weight 108.9 kg, SpO2 98%.  The patient is calm and cooperative at this time.  There have been no acute events since the last update.  Awaiting disposition plan from the [facility] team.     Jacolyn Pae, MD 02/22/24 [phone]

## 2024-02-22 NOTE — ED Notes (Signed)
 Pts spouse at the bedside. Pt in no distress. VSS. Ice water provided. Awaiting transport to [facility]. Will continue to monitor.

## 2024-02-22 NOTE — Progress Notes (Signed)
 Physical Therapy Treatment Patient Details Name: Mark [name]. MRN: [id number] DOB: 03/13/1941 Today's Date: 02/22/2024   History of Present Illness Mark [name]. is a 83 y.o. male with a past medical history of dementia, hyperlipidemia, gastric reflux, atrial fibrillation, presents to the emergency department for worsening somnolence.  According to the wife patient has dementia, however over the past week or 2 has been less active has been more weak and has been unable to help with transfers over the last couple days which is atypical. Patient here with UTI.    PT Comments  Pt seen for PT tx with wife present & assisting during session. Pt confused throughout session, resistive & difficult to redirect at times. Pt requires MAX assist for supine<>sit, MAX assist for static sitting EOB as pt with posterior lean, very poor ability to follow cuing to hold to EOB, difficulty correcting posterior lean, pt expresses fear of falling. Recommend ongoing PT treatment to progress mobility to reduce fall risk & caregiver burden as able.    If plan is discharge home, recommend the following: Two people to help with walking and/or transfers;Two people to help with bathing/dressing/bathroom   Can travel by private vehicle     No  Equipment Recommendations  None recommended by PT    Recommendations for Other Services       Precautions / Restrictions Precautions Precautions: Fall Restrictions Weight Bearing Restrictions Per Provider Order: No     Mobility  Bed Mobility Overal bed mobility: Needs Assistance Bed Mobility: Supine to Sit     Supine to sit: Max assist, HOB elevated Sit to supine: Max assist, HOB elevated        Transfers                        Ambulation/Gait                   Stairs             Wheelchair Mobility     Tilt Bed    Modified Rankin (Stroke Patients Only)       Balance Overall balance assessment: Needs  assistance Sitting-balance support: Bilateral upper extremity supported, No upper extremity supported, Feet supported Sitting balance-Leahy Scale: Zero   Postural control: Posterior lean                                  Communication Communication Communication: Impaired Factors Affecting Communication: Hearing impaired;Reduced clarity of speech  Cognition Arousal: Alert Behavior During Therapy: Restless, Anxious   PT - Cognitive impairments: History of cognitive impairments, Problem solving, Safety/Judgement, Memory, Attention, Orientation, Awareness, Initiation   Orientation impairments: Place, Time, Situation                     Following commands: Impaired Following commands impaired: Follows one step commands with increased time, Follows one step commands inconsistently    Cueing Cueing Techniques: Verbal cues, Gestural cues  Exercises      General Comments        Pertinent Vitals/Pain Pain Assessment Pain Assessment: PAINAD Breathing: normal Negative Vocalization: occasional moan/groan, low speech, negative/disapproving quality Facial Expression: sad, frightened, frown Body Language: tense, distressed pacing, fidgeting Consolability: distracted or reassured by voice/touch PAINAD Score: 4    Home Living  Prior Function            PT Goals (current goals can now be found in the care plan section) Acute Rehab PT Goals Patient Stated Goal: go to rehab PT Goal Formulation: With family Time For Goal Achievement: 03/05/24 Potential to Achieve Goals: Fair Progress towards PT goals: Progressing toward goals    Frequency    Min 2X/week      PT Plan      Co-evaluation              AM-PAC PT 6 Clicks Mobility   Outcome Measure  Help needed turning from your back to your side while in a flat bed without using bedrails?: Total Help needed moving from lying on your back to sitting on the side  of a flat bed without using bedrails?: Total Help needed moving to and from a bed to a chair (including a wheelchair)?: Total Help needed standing up from a chair using your arms (e.g., wheelchair or bedside chair)?: Total Help needed to walk in hospital room?: Total Help needed climbing 3-5 steps with a railing? : Total 6 Click Score: 6    End of Session   Activity Tolerance:  (limited by impaired cognition) Patient left: in bed;with family/visitor present (in hallway bed) Nurse Communication: Mobility status PT Visit Diagnosis: Unsteadiness on feet (R26.81);Other abnormalities of gait and mobility (R26.89);Muscle weakness (generalized) (M62.81);History of falling (Z91.81)     Time: 8771-8759 PT Time Calculation (min) (ACUTE ONLY): 12 min  Charges:    $Therapeutic Activity: 8-22 mins PT General Charges $$ ACUTE PT VISIT: 1 Visit                     Richerd Pinal, PT, DPT 02/22/24, 12:57 PM   Richerd CHRISTELLA Pinal 02/22/2024, 12:54 PM

## 2024-02-23 ENCOUNTER — Encounter: Payer: Self-pay | Admitting: Orthopedic Surgery

## 2024-02-23 ENCOUNTER — Non-Acute Institutional Stay (SKILLED_NURSING_FACILITY): Payer: Self-pay | Admitting: Orthopedic Surgery

## 2024-02-23 DIAGNOSIS — N401 Enlarged prostate with lower urinary tract symptoms: Secondary | ICD-10-CM

## 2024-02-23 DIAGNOSIS — Z86711 Personal history of pulmonary embolism: Secondary | ICD-10-CM

## 2024-02-23 DIAGNOSIS — R29898 Other symptoms and signs involving the musculoskeletal system: Secondary | ICD-10-CM | POA: Diagnosis not present

## 2024-02-23 DIAGNOSIS — I480 Paroxysmal atrial fibrillation: Secondary | ICD-10-CM | POA: Diagnosis not present

## 2024-02-23 DIAGNOSIS — F03B2 Unspecified dementia, moderate, with psychotic disturbance: Secondary | ICD-10-CM | POA: Diagnosis not present

## 2024-02-23 DIAGNOSIS — R1310 Dysphagia, unspecified: Secondary | ICD-10-CM

## 2024-02-23 DIAGNOSIS — K219 Gastro-esophageal reflux disease without esophagitis: Secondary | ICD-10-CM

## 2024-02-23 DIAGNOSIS — N3941 Urge incontinence: Secondary | ICD-10-CM

## 2024-02-23 DIAGNOSIS — N3000 Acute cystitis without hematuria: Secondary | ICD-10-CM

## 2024-02-23 NOTE — Progress Notes (Addendum)
 Location:  Other [facility].  Nursing Home Room Number: [facility] [id number] Place of Service:  SNF (31) Provider:  Greig Cluster, NP   PCP: Sadie Manna, MD  Patient Care Team: Sadie Manna, MD as PCP - General (Internal Medicine) Darius Debby Ruth, MD as Referring Physician (Hematology and Oncology)  Extended Emergency Contact Information Primary Emergency Contact: Neal,[name] S Address: [address]          [place], [place] [address] [place] Home Phone: [phone] Mobile Phone: [phone] Relation: Spouse Secondary Emergency Contact: Boardman,Bracy  [place] Mobile Phone: [phone] Relation: Son  Code Status:  Full Code Goals of care: Advanced Directive information    02/23/2024    9:03 AM  Advanced Directives  Does Patient Have a Medical Advance Directive? No  Would patient like information on creating a medical advance directive? No - Patient declined     Chief Complaint  Patient presents with   Hospitalization Follow-up    Hospital Follow up    HPI:  Pt is a 83 y.o. male seen today for f/u s/p ED visit 11/30-12/03 due to weakness.   He currently resides on the rehab unit at [facility]. PMH: hypotension, PAF, PE, ARF, HLD, GERD, nausea, DDD, OA, BPH, dementia, depression, psychosis and anxiety.   Wife present during encounter.   Diagnosed with dementia a few years ago. Prior to ED visit, he was diagnosed with UTI by primary. He had started 2 doses of Bactrim  but confusion and weakness worsened. He had 3 falls at home during this time. Wife was concerned of quick decline and took him to the ED. Lab work unremarkable. CT head negative for acute intracranial abnormality, but subcortical white matter changes and mild diffuse cerebral loss noted. He was discharged to [facility] rehab for additional PT/OT/nursing services.   He is unable to answer simple questions or follow commands. Appears aphasia has worsened within last 2  weeks. He is pleasantly confused. He has tried twice to get out of bed on his own. He had difficulty taking sips of water and would cough. Afebrile. Vitals stable.         Past Medical History:  Diagnosis Date   Anxiety    Atrial fibrillation (HCC)    Back pain, chronic    Cancer (HCC)    skin   Colon polyp    Cyst of left kidney    Dementia (HCC)    GERD (gastroesophageal reflux disease)    Heart murmur    Hyperlipemia    Mitral valve prolapse    Neuromuscular disorder (HCC)    Neuropathy    Osteoarthritis    Pulmonary emboli (HCC)    Spinal stenosis    Thyroid  nodule    Vertigo    Past Surgical History:  Procedure Laterality Date   APPENDECTOMY     BACK SURGERY     COLONOSCOPY N/A 08/15/2014   Procedure: COLONOSCOPY;  Surgeon: Lamar ONEIDA Holmes, MD;  Location: [facility] ENDOSCOPY;  Service: Endoscopy;  Laterality: N/A;   ESOPHAGOGASTRODUODENOSCOPY N/A 08/15/2014   Procedure: ESOPHAGOGASTRODUODENOSCOPY (EGD);  Surgeon: Lamar ONEIDA Holmes, MD;  Location: [facility] ENDOSCOPY;  Service: Endoscopy;  Laterality: N/A;   ESOPHAGOGASTRODUODENOSCOPY N/A 12/22/2023   Procedure: EGD (ESOPHAGOGASTRODUODENOSCOPY);  Surgeon: Jinny Carmine, MD;  Location: [facility] ENDOSCOPY;  Service: Endoscopy;  Laterality: N/A;   IR KYPHO LUMBAR INC FX REDUCE BONE BX UNI/BIL CANNULATION INC/IMAGING  10/22/2022   IR RADIOLOGIST EVAL & MGMT  10/15/2022   IR RADIOLOGIST EVAL &  MGMT  11/03/2022   IR RADIOLOGIST EVAL & MGMT  11/05/2022   TONSILLECTOMY      Allergies  Allergen Reactions   Ceftriaxone Anaphylaxis and Rash   Donepezil Other (See Comments)    Hallucination, confusion  Other Reaction(s): Hallucinations (code- 2988998, SNOMED CT); Confusional state (code- 713066996, SNOMED CT), Not available  donepezil  Per pt 's wife report:  He goes absolutely nuts    donepezil    Hallucination, confusion   Erythromycin Swelling and Other (See Comments)   Penicillins Swelling and Rash    Has patient had a PCN  reaction causing immediate rash, facial/tongue/throat swelling, SOB or lightheadedness with hypotension: Yes  Has patient had a PCN reaction causing severe rash involving mucus membranes or skin necrosis: No  Has patient had a PCN reaction that required hospitalization: Unknown  Has patient had a PCN reaction occurring within the last 10 years: No  If all of the above answers are NO, then may proceed with Cephalosporin use.  Product containing penicillin (product)   Ciprofloxacin Other (See Comments)   Keflex [Cephalexin] Swelling   Penicillamine Other (See Comments)    Outpatient Encounter Medications as of 02/23/2024  Medication Sig   apixaban  (ELIQUIS ) 2.5 MG TABS tablet Take 1 tablet by mouth every 12 (twelve) hours.   buPROPion  (WELLBUTRIN  XL) 150 MG 24 hr tablet Take 150 mg by mouth daily.   cholecalciferol (VITAMIN D3) 10 MCG (400 UNIT) TABS tablet Take 400 Units by mouth daily.   Coenzyme Q10 (CO Q 10 PO) Take 1 Dose by mouth daily.    Cranberry 250 MG TABS Take 1 tablet (250 mg total) by mouth daily.   Cyanocobalamin  (B-12) 5000 MCG CAPS Take 5,000 mg by mouth daily.   esomeprazole (NEXIUM) 40 MG capsule Take 1 capsule by mouth daily.   finasteride  (PROSCAR ) 5 MG tablet TAKE 1 TABLET BY MOUTH DAILY   FLUoxetine  (PROZAC ) 40 MG capsule TAKE 2 CAPSULES (80MG  TOTAL) BY MOUTH ONCE DAILY   folic acid  (FOLVITE ) 1 MG tablet Take 1 mg by mouth daily.   meclizine  (ANTIVERT ) 25 MG tablet Take 1 tablet (25 mg total) by mouth 3 (three) times daily as needed for dizziness.   memantine  (NAMENDA ) 5 MG tablet Take 5 mg by mouth daily.   polyethylene glycol (MIRALAX  / GLYCOLAX ) 17 g packet Take 17 g by mouth daily as needed for moderate constipation.   senna (SENOKOT) 8.6 MG TABS tablet Take 1 tablet (8.6 mg total) by mouth daily as needed for mild constipation.   simvastatin  (ZOCOR ) 40 MG tablet Take 40 mg by mouth daily.   sulfamethoxazole -trimethoprim  (BACTRIM  DS) 800-160 MG tablet Take 1  tablet by mouth 2 (two) times daily.   tamsulosin  (FLOMAX ) 0.4 MG CAPS capsule Take 1 capsule (0.4 mg total) by mouth daily.   Facility-Administered Encounter Medications as of 02/23/2024  Medication   nystatin  (MYCOSTATIN /NYSTOP ) topical powder    Review of Systems  Unable to perform ROS: Dementia    Immunization History  Administered Date(s) Administered   Fluzone Influenza virus vaccine,trivalent (IIV3), split virus 01/03/2024   INFLUENZA, HIGH DOSE SEASONAL PF 12/07/2018   Influenza Split 01/02/2015   Influenza-Unspecified 01/04/2012, 01/08/2012, 01/18/2013, 12/27/2013, 01/02/2015, 12/06/2015, 12/11/2015, 12/23/2016, 12/31/2017, 11/30/2019, 01/22/2021   PFIZER Comirnaty(Gray Top)Covid-19 Tri-Sucrose Vaccine 04/20/2019, 05/08/2019   PFIZER(Purple Top)SARS-COV-2 Vaccination 04/17/2019, 05/08/2019, 12/25/2019   Pneumococcal Conjugate-13 02/24/2016, 07/08/2016   Pneumococcal Polysaccharide-23 05/07/2015   Respiratory Syncytial Virus Vaccine,Recomb Aduvanted(Arexvy) 01/04/2022, 07/02/2022   Tdap 08/23/2013, 08/02/2022   Zoster  Recombinant(Shingrix) 07/30/2017, 12/04/2017, 09/02/2018   Zoster, Live 10/03/2015   Pertinent  Health Maintenance Due  Topic Date Due   Influenza Vaccine  Completed      05/06/2018   10:20 AM 02/04/2020    4:32 PM 10/13/2020    7:29 AM 12/01/2020   12:36 PM 01/16/2022   12:11 PM  Fall Risk  Falls in the past year? 0       (RETIRED) Patient Fall Risk Level Low fall risk  High fall risk  Moderate fall risk  High fall risk  High fall risk      Data saved with a previous flowsheet row definition   Functional Status Survey:    Vitals:   02/23/24 0855  BP: 138/88  Pulse: 84  Resp: 18  Temp: (!) 97.1 F (36.2 C)  SpO2: 97%  Weight: 183 lb 9.6 oz (83.3 kg)  Height: 6' 4 (1.93 m)   Body mass index is 22.35 kg/m. Physical Exam Vitals reviewed.  Constitutional:      General: He is not in acute distress. HENT:     Head: Normocephalic.  Eyes:      General:        Right eye: No discharge.        Left eye: No discharge.  Cardiovascular:     Rate and Rhythm: Normal rate and regular rhythm.     Pulses: Normal pulses.     Heart sounds: Murmur heard.  Pulmonary:     Effort: Pulmonary effort is normal. No respiratory distress.     Breath sounds: Normal breath sounds. No wheezing or rales.  Abdominal:     General: Bowel sounds are normal. There is no distension.     Palpations: Abdomen is soft.     Tenderness: There is no abdominal tenderness.  Musculoskeletal:     Cervical back: Neck supple.     Right lower leg: No edema.     Left lower leg: No edema.  Skin:    General: Skin is warm.     Capillary Refill: Capillary refill takes less than 2 seconds.     Comments: Abrasions to bilateral shins, scabs intact, no infection  Neurological:     General: No focal deficit present.     Mental Status: He is alert. Mental status is at baseline.     Motor: Weakness present.     Gait: Gait abnormal.  Psychiatric:     Comments: Aphasia, does not follow commands, alert to self/familiar face     Labs reviewed: Recent Labs    11/18/23 1448 12/21/23 1448 12/23/23 0224 12/24/23 0418 12/25/23 0320 02/20/24 0750  NA 138   < > 137 137 137 136  K 3.8   < > 3.2* 4.6 4.0 4.0  CL 103   < > 106 105 103 102  CO2 21*   < > 23 26 23 23   GLUCOSE 121*   < > 85 142* 125* 90  BUN 10   < > 12 12 18 12   CREATININE 0.96   < > 0.78 0.81 0.81 0.84  CALCIUM 9.6   < > 8.5* 9.2 8.8* 9.2  MG 2.0  --  2.1 2.2  --   --    < > = values in this interval not displayed.   Recent Labs    11/18/23 1448 12/21/23 1448 02/20/24 0750  AST 31 23 22   ALT 24 21 19   ALKPHOS 87 68 87  BILITOT 1.0 1.1 0.5  PROT 7.1 7.4  6.7  ALBUMIN 3.5 3.8 3.7   Recent Labs    12/22/23 0537 12/23/23 0224 12/24/23 0418 02/20/24 0750  WBC 7.2 5.4  --  9.3  HGB 14.2 12.9* 14.4 14.2  HCT 42.5 39.1  --  42.6  MCV 92.6 93.3  --  92.0  PLT 226 197  --  290   Lab Results   Component Value Date   TSH 0.866 02/21/2018   No results found for: HGBA1C No results found for: CHOL, HDL, LDLCALC, LDLDIRECT, TRIG, CHOLHDL  Significant Diagnostic Results in last 30 days:  CT HEAD WO CONTRAST ( ) Result Date: 02/20/2024 EXAM: CT HEAD WITHOUT 02/20/2024 08:13:47 AM TECHNIQUE: CT of the head was performed without the administration of intravenous contrast. Automated exposure control, iterative reconstruction, and/or weight based adjustment of the mA/kV was utilized to reduce the radiation dose to as low as reasonably achievable. COMPARISON: CT of the head dated 08/02/2022. CLINICAL HISTORY: Mental status change, unknown cause. FINDINGS: BRAIN AND VENTRICLES: No acute intracranial hemorrhage. No mass effect or midline shift. No extra-axial fluid collection. No evidence of acute infarct. No hydrocephalus. Patchy periventricular and subcortical white matter low-density changes compatible with chronic microvascular ischemic change. Mild diffuse cerebral volume loss. ORBITS: No acute abnormality. SINUSES AND MASTOIDS: No acute abnormality. SOFT TISSUES AND SKULL: No acute skull fracture. No acute soft tissue abnormality. Atherosclerotic calcifications in skull base large vessels. IMPRESSION: 1. No acute intracranial abnormality. 2. Patchy periventricular and subcortical white matter low-density changes compatible with chronic microvascular ischemic change. 3. Mild diffuse cerebral volume loss. Electronically signed by: Evalene Coho MD 02/20/2024 08:22 AM EST RP Workstation: [id number]    Assessment/Plan 1. Weakness of both lower extremities (Primary) - ED evaluation 11/30-12/03 - overall workup unremarkable except UTI present - suspect physical decline related to worsening dementia - cont PT/OT  2. Moderate dementia with psychotic disturbance, unspecified dementia type (HCC) - CT head negative for acute intracranial abnormality, but subcortical white matter  changes and mild diffuse cerebral loss noted - no MMSE on record  - no behaviors at this time - dependent with ADLs except feeding  - unable to pivot transfer to w/c at this time - recommend bed exit alarm - cont Namenda , Prozac , Wellbutrin   3. PAF (paroxysmal atrial fibrillation) (HCC) - HR< 100 without medication - cont Eliquis    4. History of pulmonary embolus (PE) - cont Eliquis    5. Gastroesophageal reflux disease without esophagitis - hgb 14.2 - cont esomeprazole  6. 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 Acute cystitis without hematuria - cont Bactrim    8. Dysphagia, unspecified type - ST evaluation     Family/ staff Communication: plan discussed with patient and nurse  Labs/tests ordered:  ST evaluation

## 2024-02-25 ENCOUNTER — Encounter: Payer: Self-pay | Admitting: Orthopedic Surgery

## 2024-02-25 ENCOUNTER — Non-Acute Institutional Stay (SKILLED_NURSING_FACILITY): Payer: Self-pay | Admitting: Orthopedic Surgery

## 2024-02-25 DIAGNOSIS — K1370 Unspecified lesions of oral mucosa: Secondary | ICD-10-CM

## 2024-02-25 DIAGNOSIS — R400 Somnolence: Secondary | ICD-10-CM

## 2024-02-25 DIAGNOSIS — F03B2 Unspecified dementia, moderate, with psychotic disturbance: Secondary | ICD-10-CM | POA: Diagnosis not present

## 2024-02-25 LAB — COMPREHENSIVE METABOLIC PANEL WITH GFR
Albumin: 4 (ref 3.5–5.0)
Calcium: 9.7 (ref 8.7–10.7)
Globulin: 2.8

## 2024-02-25 LAB — HEPATIC FUNCTION PANEL
ALT: 17 U/L (ref 10–40)
AST: 17 (ref 14–40)
Alkaline Phosphatase: 80 (ref 25–125)
Bilirubin, Total: 0.7

## 2024-02-25 LAB — BASIC METABOLIC PANEL WITH GFR
BUN: 22 — AB (ref 4–21)
CO2: 28 — AB (ref 13–22)
Chloride: 100 (ref 99–108)
Creatinine: 1.1 (ref 0.6–1.3)
Glucose: 134
Potassium: 4.5 meq/L (ref 3.5–5.1)
Sodium: 138 (ref 137–147)

## 2024-02-25 LAB — CBC AND DIFFERENTIAL
HCT: 49 (ref 41–53)
Hemoglobin: 16.3 (ref 13.5–17.5)
Neutrophils Absolute: 7991
Platelets: 339 K/uL (ref 150–400)
WBC: 10.5

## 2024-02-25 LAB — CBC: RBC: 5.17 — AB (ref 3.87–5.11)

## 2024-02-25 MED ORDER — LIDOCAINE VISCOUS HCL 2 % MT SOLN: 5.0000 mL | Freq: Four times a day (QID) | OROMUCOSAL | 0 refills | Status: DC

## 2024-02-25 NOTE — Progress Notes (Unsigned)
 Location:  Other Nursing Home Room Number: 117/A Place of Service:  SNF (31) Provider:  Greig FORBES Cluster, NP   Sadie Manna, MD  Patient Care Team: Sadie Manna, MD as PCP - General (Internal Medicine) Darius Debby Ruth, MD as Referring Physician (Hematology and Oncology)  Extended Emergency Contact Information Primary Emergency Contact: Grygiel,[name] S Address: [address]          [place], [place] [address] [place] Home Phone: [phone] Mobile Phone: [phone] Relation: Spouse Secondary Emergency Contact: Bar,Erastus  [place] Mobile Phone: [phone] Relation: Son  Code Status:  DNR Goals of care: Advanced Directive information    02/23/2024    9:03 AM  Advanced Directives  Does Patient Have a Medical Advance Directive? No  Would patient like information on creating a medical advance directive? No - Patient declined     Chief Complaint  Patient presents with   Acute Visit    Oral lesions and somnolence    HPI:  Pt is a 83 y.o. male seen today for acute visit due to oral lesions and somnolence.   He currently resides on the rehab unit at [facility]. PMH: hypotension, PAF, PE, ARF, HLD, GERD, nausea, DDD, OA, BPH, dementia, depression, psychosis and anxiety.   Poor historian due to dementia. Recently admitted to rehab due to weakness. ED observation 11/30-12/03> + UTI. 12/03 concerns for dysphagia and ST evaluation ordered. Today, ST concerns for oral lesions. Pharynx with increased redness. He has not been tolerating fluids or foods well. He is easily aroused but falls right back to sleep. Does not follow commands. Mainly non verbal. He has been on Bactrim  due to recent UTI. Afebrile. Vitals stable.     Past Medical History:  Diagnosis Date   Anxiety    Atrial fibrillation (HCC)    Back pain, chronic    Cancer (HCC)    skin   Colon polyp    Cyst of left kidney    Dementia (HCC)    GERD (gastroesophageal reflux  disease)    Heart murmur    Hyperlipemia    Mitral valve prolapse    Neuromuscular disorder (HCC)    Neuropathy    Osteoarthritis    Pulmonary emboli (HCC)    Spinal stenosis    Thyroid  nodule    Vertigo    Past Surgical History:  Procedure Laterality Date   APPENDECTOMY     BACK SURGERY     COLONOSCOPY N/A 08/15/2014   Procedure: COLONOSCOPY;  Surgeon: Lamar ONEIDA Holmes, MD;  Location: [facility] ENDOSCOPY;  Service: Endoscopy;  Laterality: N/A;   ESOPHAGOGASTRODUODENOSCOPY N/A 08/15/2014   Procedure: ESOPHAGOGASTRODUODENOSCOPY (EGD);  Surgeon: Lamar ONEIDA Holmes, MD;  Location: [facility] ENDOSCOPY;  Service: Endoscopy;  Laterality: N/A;   ESOPHAGOGASTRODUODENOSCOPY N/A 12/22/2023   Procedure: EGD (ESOPHAGOGASTRODUODENOSCOPY);  Surgeon: Jinny Carmine, MD;  Location: [facility] ENDOSCOPY;  Service: Endoscopy;  Laterality: N/A;   IR KYPHO LUMBAR INC FX REDUCE BONE BX UNI/BIL CANNULATION INC/IMAGING  10/22/2022   IR RADIOLOGIST EVAL & MGMT  10/15/2022   IR RADIOLOGIST EVAL & MGMT  11/03/2022   IR RADIOLOGIST EVAL & MGMT  11/05/2022   TONSILLECTOMY      Allergies  Allergen Reactions   Ceftriaxone Anaphylaxis and Rash   Donepezil Other (See Comments)    Hallucination, confusion  Other Reaction(s): Hallucinations (code- 2988998, SNOMED CT); Confusional state (code- 713066996, SNOMED CT), Not available  donepezil  Per pt 's wife report:  He goes absolutely nuts  donepezil    Hallucination, confusion   Erythromycin Swelling and Other (See Comments)   Penicillins Swelling and Rash    Has patient had a PCN reaction causing immediate rash, facial/tongue/throat swelling, SOB or lightheadedness with hypotension: Yes  Has patient had a PCN reaction causing severe rash involving mucus membranes or skin necrosis: No  Has patient had a PCN reaction that required hospitalization: Unknown  Has patient had a PCN reaction occurring within the last 10 years: No  If all of the above answers are NO, then may  proceed with Cephalosporin use.  Product containing penicillin (product)   Ciprofloxacin Other (See Comments)   Keflex [Cephalexin] Swelling   Penicillamine Other (See Comments)    Outpatient Encounter Medications as of 02/25/2024  Medication Sig   magic mouthwash (lidocaine , diphenhydrAMINE , alum & mag hydroxide) suspension Swish and spit 5 mLs 4 (four) times daily. Apply with oral swab   apixaban  (ELIQUIS ) 2.5 MG TABS tablet Take 1 tablet by mouth every 12 (twelve) hours.   buPROPion  (WELLBUTRIN  XL) 150 MG 24 hr tablet Take 150 mg by mouth daily.   cholecalciferol (VITAMIN D3) 10 MCG (400 UNIT) TABS tablet Take 400 Units by mouth daily.   Coenzyme Q10 (CO Q 10 PO) Take 1 Dose by mouth daily.    Cranberry 250 MG TABS Take 1 tablet (250 mg total) by mouth daily.   Cyanocobalamin  (B-12) 5000 MCG CAPS Take 5,000 mg by mouth daily.   esomeprazole (NEXIUM) 40 MG capsule Take 1 capsule by mouth daily.   finasteride  (PROSCAR ) 5 MG tablet TAKE 1 TABLET BY MOUTH DAILY   FLUoxetine  (PROZAC ) 40 MG capsule TAKE 2 CAPSULES (80MG  TOTAL) BY MOUTH ONCE DAILY   folic acid  (FOLVITE ) 1 MG tablet Take 1 mg by mouth daily.   meclizine  (ANTIVERT ) 25 MG tablet Take 1 tablet (25 mg total) by mouth 3 (three) times daily as needed for dizziness.   memantine  (NAMENDA ) 5 MG tablet Take 5 mg by mouth daily.   polyethylene glycol (MIRALAX  / GLYCOLAX ) 17 g packet Take 17 g by mouth daily as needed for moderate constipation.   senna (SENOKOT) 8.6 MG TABS tablet Take 1 tablet (8.6 mg total) by mouth daily as needed for mild constipation.   simvastatin  (ZOCOR ) 40 MG tablet Take 40 mg by mouth daily.   sulfamethoxazole -trimethoprim  (BACTRIM  DS) 800-160 MG tablet Take 1 tablet by mouth 2 (two) times daily.   tamsulosin  (FLOMAX ) 0.4 MG CAPS capsule Take 1 capsule (0.4 mg total) by mouth daily.   Facility-Administered Encounter Medications as of 02/25/2024  Medication   nystatin  (MYCOSTATIN /NYSTOP ) topical powder     Review of Systems  Unable to perform ROS: Dementia    Immunization History  Administered Date(s) Administered   Fluzone Influenza virus vaccine,trivalent (IIV3), split virus 01/03/2024   INFLUENZA, HIGH DOSE SEASONAL PF 12/07/2018   Influenza Split 01/02/2015   Influenza-Unspecified 01/04/2012, 01/08/2012, 01/18/2013, 12/27/2013, 01/02/2015, 12/06/2015, 12/11/2015, 12/23/2016, 12/31/2017, 11/30/2019, 01/22/2021   PFIZER Comirnaty(Gray Top)Covid-19 Tri-Sucrose Vaccine 04/20/2019, 05/08/2019   PFIZER(Purple Top)SARS-COV-2 Vaccination 04/17/2019, 05/08/2019, 12/25/2019   Pneumococcal Conjugate-13 02/24/2016, 07/08/2016   Pneumococcal Polysaccharide-23 05/07/2015   Respiratory Syncytial Virus Vaccine,Recomb Aduvanted(Arexvy) 01/04/2022, 07/02/2022   Tdap 08/23/2013, 08/02/2022   Zoster Recombinant(Shingrix) 07/30/2017, 12/04/2017, 09/02/2018   Zoster, Live 10/03/2015   Pertinent  Health Maintenance Due  Topic Date Due   Influenza Vaccine  Completed      02/04/2020    4:32 PM 10/13/2020    7:29 AM 12/01/2020   12:36 PM 01/16/2022  12:11 PM 02/23/2024    4:28 PM  Fall Risk  Falls in the past year?     1  Was there an injury with Fall?     1  Fall Risk Category Calculator     3  (RETIRED) Patient Fall Risk Level High fall risk  Moderate fall risk  High fall risk  High fall risk    Patient at Risk for Falls Due to     History of fall(s);Impaired balance/gait  Fall risk Follow up     Falls evaluation completed     Data saved with a previous flowsheet row definition   Functional Status Survey:    Vitals:   02/25/24 1653  BP: 108/67  Pulse: 65  Resp: 15  Temp: (!) 96.7 F (35.9 C)  SpO2: 95%  Weight: 154 lb 3.2 oz (69.9 kg)   Body mass index is 18.77 kg/m. Physical Exam Vitals reviewed.  Constitutional:      General: He is not in acute distress. HENT:     Head: Normocephalic and atraumatic.     Right Ear: Tympanic membrane normal.     Left Ear: Tympanic membrane  normal.     Nose: Nose normal. No rhinorrhea.     Mouth/Throat:     Mouth: Mucous membranes are dry. Oral lesions present. No injury.     Pharynx: Uvula midline. Posterior oropharyngeal erythema present. No pharyngeal swelling.     Tonsils: No tonsillar abscesses.  Eyes:     General:        Right eye: No discharge.        Left eye: No discharge.  Cardiovascular:     Rate and Rhythm: Normal rate and regular rhythm.     Pulses: Normal pulses.     Heart sounds: Normal heart sounds.  Pulmonary:     Effort: Pulmonary effort is normal.     Breath sounds: Normal breath sounds.  Abdominal:     General: Bowel sounds are normal. There is no distension.     Palpations: Abdomen is soft.     Tenderness: There is no abdominal tenderness.  Musculoskeletal:     Cervical back: Neck supple.     Right lower leg: No edema.     Left lower leg: No edema.  Skin:    General: Skin is warm.     Capillary Refill: Capillary refill takes less than 2 seconds.  Neurological:     General: No focal deficit present.     Mental Status: He is easily aroused. Mental status is at baseline.  Psychiatric:        Mood and Affect: Mood normal.     Comments: Nonverbal, does not follow commands     Labs reviewed: Recent Labs    11/18/23 1448 12/21/23 1448 12/23/23 0224 12/24/23 0418 12/25/23 0320 02/20/24 0750  NA 138   < > 137 137 137 136  K 3.8   < > 3.2* 4.6 4.0 4.0  CL 103   < > 106 105 103 102  CO2 21*   < > 23 26 23 23   GLUCOSE 121*   < > 85 142* 125* 90  BUN 10   < > 12 12 18 12   CREATININE 0.96   < > 0.78 0.81 0.81 0.84  CALCIUM 9.6   < > 8.5* 9.2 8.8* 9.2  MG 2.0  --  2.1 2.2  --   --    < > = values in this interval not displayed.  Recent Labs    11/18/23 1448 12/21/23 1448 02/20/24 0750  AST 31 23 22   ALT 24 21 19   ALKPHOS 87 68 87  BILITOT 1.0 1.1 0.5  PROT 7.1 7.4 6.7  ALBUMIN 3.5 3.8 3.7   Recent Labs    12/22/23 0537 12/23/23 0224 12/24/23 0418 02/20/24 0750  WBC 7.2  5.4  --  9.3  HGB 14.2 12.9* 14.4 14.2  HCT 42.5 39.1  --  42.6  MCV 92.6 93.3  --  92.0  PLT 226 197  --  290   Lab Results  Component Value Date   TSH 0.866 02/21/2018   No results found for: HGBA1C No results found for: CHOL, HDL, LDLCALC, LDLDIRECT, TRIG, CHOLHDL  Significant Diagnostic Results in last 30 days:  CT HEAD WO CONTRAST ( ) Result Date: 02/20/2024 EXAM: CT HEAD WITHOUT 02/20/2024 08:13:47 AM TECHNIQUE: CT of the head was performed without the administration of intravenous contrast. Automated exposure control, iterative reconstruction, and/or weight based adjustment of the mA/kV was utilized to reduce the radiation dose to as low as reasonably achievable. COMPARISON: CT of the head dated 08/02/2022. CLINICAL HISTORY: Mental status change, unknown cause. FINDINGS: BRAIN AND VENTRICLES: No acute intracranial hemorrhage. No mass effect or midline shift. No extra-axial fluid collection. No evidence of acute infarct. No hydrocephalus. Patchy periventricular and subcortical white matter low-density changes compatible with chronic microvascular ischemic change. Mild diffuse cerebral volume loss. ORBITS: No acute abnormality. SINUSES AND MASTOIDS: No acute abnormality. SOFT TISSUES AND SKULL: No acute skull fracture. No acute soft tissue abnormality. Atherosclerotic calcifications in skull base large vessels. IMPRESSION: 1. No acute intracranial abnormality. 2. Patchy periventricular and subcortical white matter low-density changes compatible with chronic microvascular ischemic change. 3. Mild diffuse cerebral volume loss. Electronically signed by: Evalene Coho MD 02/20/2024 08:22 AM EST RP Workstation: [id number]    Assessment/Plan 1. Unspecified lesions of oral mucosa (Primary) - noted by ST - pharynx with dryness and redness - will start magic mouthwash> swab QID  2. Somnolence - lung sounds diminished on exam  - more alert last encounter  - stat CXR,  cbc/diff, cmp> unremarkable  - update: 12/08 back to baseline   3. Moderate dementia with psychotic disturbance, unspecified dementia type (HCC) - CT head negative for acute intracranial abnormality, but subcortical white matter changes and mild diffuse cerebral loss noted - no MMSE on record  - no behaviors at this time - dependent with ADLs except feeding  - unable to pivot transfer to w/c at this time - recommend bed exit alarm - cont Namenda , Prozac , Wellbutrin   Family/ staff Communication: plan discussed with patient and nurse  Labs/tests ordered:  stat CXR, cbc/diff, cmp

## 2024-02-28 LAB — COMPREHENSIVE METABOLIC PANEL WITH GFR
Calcium: 9 (ref 8.7–10.7)
eGFR: 84

## 2024-02-28 LAB — CBC AND DIFFERENTIAL
HCT: 48 (ref 41–53)
Hemoglobin: 15.4 (ref 13.5–17.5)
Neutrophils Absolute: 6470
Platelets: 297 K/uL (ref 150–400)
WBC: 9.6

## 2024-02-28 LAB — BASIC METABOLIC PANEL WITH GFR
BUN: 30 — AB (ref 4–21)
CO2: 28 — AB (ref 13–22)
Chloride: 100 (ref 99–108)
Creatinine: 0.9 (ref 0.6–1.3)
Glucose: 97
Potassium: 4.4 meq/L (ref 3.5–5.1)
Sodium: 137 (ref 137–147)

## 2024-02-28 LAB — CBC: RBC: 5.07 (ref 3.87–5.11)

## 2024-03-02 ENCOUNTER — Non-Acute Institutional Stay (SKILLED_NURSING_FACILITY): Admitting: Internal Medicine

## 2024-03-02 ENCOUNTER — Encounter: Payer: Self-pay | Admitting: Internal Medicine

## 2024-03-02 DIAGNOSIS — F330 Major depressive disorder, recurrent, mild: Secondary | ICD-10-CM | POA: Diagnosis not present

## 2024-03-02 DIAGNOSIS — M545 Low back pain, unspecified: Secondary | ICD-10-CM

## 2024-03-02 DIAGNOSIS — R1311 Dysphagia, oral phase: Secondary | ICD-10-CM | POA: Diagnosis not present

## 2024-03-02 DIAGNOSIS — I480 Paroxysmal atrial fibrillation: Secondary | ICD-10-CM | POA: Diagnosis not present

## 2024-03-02 DIAGNOSIS — N400 Enlarged prostate without lower urinary tract symptoms: Secondary | ICD-10-CM

## 2024-03-02 DIAGNOSIS — G8929 Other chronic pain: Secondary | ICD-10-CM

## 2024-03-02 DIAGNOSIS — G3184 Mild cognitive impairment, so stated: Secondary | ICD-10-CM

## 2024-03-02 DIAGNOSIS — F419 Anxiety disorder, unspecified: Secondary | ICD-10-CM | POA: Diagnosis not present

## 2024-03-02 DIAGNOSIS — Z86711 Personal history of pulmonary embolism: Secondary | ICD-10-CM | POA: Diagnosis not present

## 2024-03-02 DIAGNOSIS — E782 Mixed hyperlipidemia: Secondary | ICD-10-CM | POA: Diagnosis not present

## 2024-03-02 DIAGNOSIS — Z7901 Long term (current) use of anticoagulants: Secondary | ICD-10-CM

## 2024-03-02 DIAGNOSIS — E440 Moderate protein-calorie malnutrition: Secondary | ICD-10-CM | POA: Diagnosis not present

## 2024-03-02 DIAGNOSIS — K219 Gastro-esophageal reflux disease without esophagitis: Secondary | ICD-10-CM | POA: Diagnosis not present

## 2024-03-02 DIAGNOSIS — R5381 Other malaise: Secondary | ICD-10-CM

## 2024-03-02 NOTE — Assessment & Plan Note (Signed)
 Continue with Prozac  40 mg every morning and Wellbutrin  XL 150 mg every morning.  Can consider dose reduction next year 2026(1st quarter)

## 2024-03-02 NOTE — Assessment & Plan Note (Signed)
Continue with Flomax and Proscar

## 2024-03-02 NOTE — Assessment & Plan Note (Signed)
 Continue with Namenda  5 mg nightly

## 2024-03-02 NOTE — Assessment & Plan Note (Signed)
 Patient to continue working with physical therapy.  Wife understands that he may not be able to go home.  She is prepared to keep the patient at [facility] for long-term care.

## 2024-03-02 NOTE — Progress Notes (Signed)
 [facility] SNF Admission H&P  Provider: Camellia Door, DO Location:  Other [facility] Room Number: [place] [id number] Place of Service:  SNF (31)   PCP: Sadie Manna, MD Patient Care Team: Sadie Manna, MD as PCP - General (Internal Medicine) Ortel, Debby Ruth, MD as Referring Physician (Hematology and Oncology)   Extended Emergency Contact Information Primary Emergency Contact: Castor,Mark [name] S Address: [address]          [place], [place] [address] [place] Home Phone: [phone] Mobile Phone: [phone] Relation: Spouse Secondary Emergency Contact: Daino,Townsend  [place] Mobile Phone: [phone] Relation: Son   Goals of Care: Advanced Directive information    03/02/2024    8:36 AM  Advanced Directives  Does Patient Have a Medical Advance Directive? No  Would patient like information on creating a medical advance directive? No - Patient declined    CODE STATUS: Full Code    Chief Complaint  Patient presents with   New Admit To SNF    Admission.      HPI: Patient is a 83 y.o. male seen today for admission to [facility].  I met with the patient's wife Mark [name] and the patient at the bedside today.  He was admitted to the hospital due to UTI.  He was discharged to [facility] for rehab.  The wife states that the patient has been essentially wheelchair-bound for about a year now.  Patient is not walking.  She is prepared to enroll the patient into long-term care at [facility].  His rehab is going slow.  He has hearing loss.  Patient kept on taking his hearing aids out of his ears when he was at the hospital.  In order not to annoy him, the wife has not brought back the patient's hearing aids.  Patient is having difficulty with appetite.  He has been losing some weight.  He does not like the taste of the food.  This is a comprehensive admission note to this SNF performed on this date less than 30 days from date  of admission. Included are preadmission medical/surgical history; reconciled medication list; family history; social history and comprehensive review of systems.  Corrections and additions to the records were documented. Comprehensive physical exam was also performed. Additionally a clinical summary was entered for each active diagnosis pertinent to this admission in the Problem List to enhance continuity of care.   Past Medical History:  Diagnosis Date   Anxiety    Atrial fibrillation (HCC)    Back pain, chronic    Cancer (HCC)    skin   Colon polyp    Cyst of left kidney    Dementia (HCC)    GERD (gastroesophageal reflux disease)    Heart murmur    Hyperlipemia    Mitral valve prolapse    Neuromuscular disorder (HCC)    Neuropathy    Osteoarthritis    Pulmonary emboli (HCC)    Pulmonary embolus (HCC) 09/11/2011   Overview:   Idiopathic pulmonary embolus.   A. Presentation September 2006 with pleuritic chest pain,        shortness of breath, and lightheadedness.   B. Patient symptomatic for one week and subsequently        diagnosed by chest x-ray abnormalities and CT scan confirmation.   C. No risk factors identified for pulmonary embolus.   D. Diagnostic studies:        1. heterozygous for FVL  2. O   Spinal stenosis    Thyroid  nodule    Vertigo    Past Surgical History:  Procedure Laterality Date   APPENDECTOMY     BACK SURGERY     COLONOSCOPY N/A 08/15/2014   Procedure: COLONOSCOPY;  Surgeon: Lamar ONEIDA Holmes, MD;  Location: [facility] ENDOSCOPY;  Service: Endoscopy;  Laterality: N/A;   ESOPHAGOGASTRODUODENOSCOPY N/A 08/15/2014   Procedure: ESOPHAGOGASTRODUODENOSCOPY (EGD);  Surgeon: Lamar ONEIDA Holmes, MD;  Location: [facility] ENDOSCOPY;  Service: Endoscopy;  Laterality: N/A;   ESOPHAGOGASTRODUODENOSCOPY N/A 12/22/2023   Procedure: EGD (ESOPHAGOGASTRODUODENOSCOPY);  Surgeon: Jinny Carmine, MD;  Location: [facility] ENDOSCOPY;  Service: Endoscopy;  Laterality: N/A;   IR KYPHO LUMBAR INC FX  REDUCE BONE BX UNI/BIL CANNULATION INC/IMAGING  10/22/2022   IR RADIOLOGIST EVAL & MGMT  10/15/2022   IR RADIOLOGIST EVAL & MGMT  11/03/2022   IR RADIOLOGIST EVAL & MGMT  11/05/2022   TONSILLECTOMY      reports that he quit smoking about 65 years ago. His smoking use included cigarettes. He has never used smokeless tobacco. He reports current alcohol use of about 1.0 standard drink of alcohol per week. He reports that he does not use drugs. Social History   Socioeconomic History   Marital status: Married    Spouse name: Mark [name]   Number of children: 2   Years of education: Not on file   Highest education level: Master's degree (e.g., MA, MS, MEng, MEd, MSW, MBA)  Occupational History   Not on file  Tobacco Use   Smoking status: Former    Current packs/day: 0.00    Types: Cigarettes    Quit date: 04/20/1958    Years since quitting: 65.9   Smokeless tobacco: Never  Vaping Use   Vaping status: Never Used  Substance and Sexual Activity   Alcohol use: Yes    Alcohol/week: 1.0 standard drink of alcohol    Types: 1 Glasses of wine per week   Drug use: Never   Sexual activity: Not Currently  Other Topics Concern   Not on file  Social History Narrative   Not on file   Social Drivers of Health   Tobacco Use: Medium Risk (03/02/2024)   Patient History    Smoking Tobacco Use: Former    Smokeless Tobacco Use: Never    Passive Exposure: Not on file  Financial Resource Strain: Low Risk  (10/19/2023)   Received from [facility]   Overall Financial Resource Strain (CARDIA)    Difficulty of Paying Living Expenses: Not hard at all  Food Insecurity: No Food Insecurity (12/21/2023)   Epic    Worried About Radiation Protection Practitioner of Food in the Last Year: Never true    Ran Out of Food in the Last Year: Never true  Transportation Needs: No Transportation Needs (12/21/2023)   Epic    Lack of Transportation (Medical): No    Lack of Transportation (Non-Medical): No  Physical Activity: Not on  file  Stress: Not on file  Social Connections: Moderately Isolated (12/21/2023)   Social Connection and Isolation Panel    Frequency of Communication with Friends and Family: More than three times a week    Frequency of Social Gatherings with Friends and Family: Never    Attends Religious Services: Never    Database Administrator or Organizations: No    Attends Banker Meetings: Never    Marital Status: Married  Catering Manager Violence: Not At Risk (12/21/2023)   Epic    Fear  of Current or Ex-Partner: No    Emotionally Abused: No    Physically Abused: No    Sexually Abused: No  Depression (PHQ2-9): High Risk (07/27/2023)   Depression (PHQ2-9)    PHQ-2 Score: 12  Alcohol Screen: Not on file  Housing: Low Risk (12/21/2023)   Epic    Unable to Pay for Housing in the Last Year: No    Number of Times Moved in the Last Year: 0    Homeless in the Last Year: No  Utilities: Not At Risk (12/21/2023)   Epic    Threatened with loss of utilities: No  Health Literacy: Not on file     Functional Status Survey:     Family History  Problem Relation Age of Onset   Stroke Mother    COPD Father      Health Maintenance  Topic Date Due   Medicare Annual Wellness (AWV)  Never done   COVID-19 Vaccine (6 - 2025-26 season) 11/22/2023   DTaP/Tdap/Td (3 - Td or Tdap) 08/01/2032   Pneumococcal Vaccine: 50+ Years  Completed   Influenza Vaccine  Completed   Zoster Vaccines- Shingrix  Completed   Meningococcal B Vaccine  Aged Out     Allergies[1]   Outpatient Encounter Medications as of 03/02/2024  Medication Sig   acetaminophen  (TYLENOL ) 325 MG tablet Take 650 mg by mouth every 4 (four) hours as needed.   apixaban  (ELIQUIS ) 2.5 MG TABS tablet Take 1 tablet by mouth every 12 (twelve) hours.   bisacodyl  (DULCOLAX) 10 MG suppository Place 10 mg rectally as needed for moderate constipation.   buPROPion  (WELLBUTRIN  XL) 150 MG 24 hr tablet Take 150 mg by mouth daily.   cholecalciferol  (VITAMIN D3) 10 MCG (400 UNIT) TABS tablet Take 400 Units by mouth daily.   Coenzyme Q10 (CO Q 10 PO) Take 1 Dose by mouth daily.    Cranberry 250 MG TABS Take 1 tablet (250 mg total) by mouth daily.   Cyanocobalamin  (B-12) 5000 MCG CAPS Take 5,000 mg by mouth daily.   esomeprazole (NEXIUM) 40 MG capsule Take 1 capsule by mouth daily.   finasteride  (PROSCAR ) 5 MG tablet TAKE 1 TABLET BY MOUTH DAILY   FLUoxetine  (PROZAC ) 40 MG capsule TAKE 2 CAPSULES (80MG  TOTAL) BY MOUTH ONCE DAILY   folic acid  (FOLVITE ) 1 MG tablet Take 1 mg by mouth daily.   magic mouthwash (lidocaine , diphenhydrAMINE , alum & mag hydroxide) suspension Swish and spit 5 mLs 4 (four) times daily. Apply with oral swab   meclizine  (ANTIVERT ) 25 MG tablet Take 1 tablet (25 mg total) by mouth 3 (three) times daily as needed for dizziness.   memantine  (NAMENDA ) 5 MG tablet Take 5 mg by mouth daily.   nystatin  (MYCOSTATIN /NYSTOP ) powder Apply 1 Application topically 3 (three) times daily.   polyethylene glycol (MIRALAX  / GLYCOLAX ) 17 g packet Take 17 g by mouth daily as needed for moderate constipation.   senna (SENOKOT) 8.6 MG TABS tablet Take 1 tablet (8.6 mg total) by mouth daily as needed for mild constipation.   simvastatin  (ZOCOR ) 40 MG tablet Take 40 mg by mouth daily.   tamsulosin  (FLOMAX ) 0.4 MG CAPS capsule Take 1 capsule (0.4 mg total) by mouth daily.   sulfamethoxazole -trimethoprim  (BACTRIM  DS) 800-160 MG tablet Take 1 tablet by mouth 2 (two) times daily. (Patient not taking: Reported on 03/02/2024)   [DISCONTINUED] magic mouthwash (nystatin , lidocaine , diphenhydrAMINE , alum & mag hydroxide) suspension 5 mLs 4 (four) times daily.   [DISCONTINUED] nystatin  (MYCOSTATIN /NYSTOP ) topical  powder    No facility-administered encounter medications on file as of 03/02/2024.     Review of Systems  Unable to perform ROS: Other  Pt is hard of hearing and does not have his hearing aids in his ears.   Vitals:   03/02/24 0820  BP:  (!) 141/77  Pulse: 82  Resp: 18  Temp: (!) 97.2 F (36.2 C)  SpO2: 92%  Weight: 186 lb 6.4 oz (84.6 kg)  Height: 6' 4 (1.93 m)   Body mass index is 22.69 kg/m. Physical Exam Vitals and nursing note reviewed.  Constitutional:      Comments: Elderly male. Appears chronically ill  HENT:     Head: Normocephalic and atraumatic.     Nose: Nose normal.  Eyes:     General: No scleral icterus. Cardiovascular:     Rate and Rhythm: Normal rate and regular rhythm.  Pulmonary:     Effort: Pulmonary effort is normal.  Abdominal:     General: Bowel sounds are normal. There is no distension.     Palpations: Abdomen is soft.  Skin:    General: Skin is warm and dry.     Capillary Refill: Capillary refill takes less than 2 seconds.  Neurological:     Comments: Hard of hearing. Does not have his hearing aids in his ears.      Labs reviewed: Basic Metabolic Panel: Recent Labs    11/18/23 1448 12/21/23 1448 12/23/23 0224 12/24/23 0418 12/25/23 0320 02/20/24 0750 02/25/24 0000 02/28/24 0000  NA 138   < > 137 137 137 136 138 137  K 3.8   < > 3.2* 4.6 4.0 4.0 4.5 4.4  CL 103   < > 106 105 103 102 100 100  CO2 21*   < > 23 26 23 23  28* 28*  GLUCOSE 121*   < > 85 142* 125* 90  --   --   BUN 10   < > 12 12 18 12  22* 30*  CREATININE 0.96   < > 0.78 0.81 0.81 0.84 1.1 0.9  CALCIUM 9.6   < > 8.5* 9.2 8.8* 9.2 9.7 9.0  MG 2.0  --  2.1 2.2  --   --   --   --    < > = values in this interval not displayed.   Liver Function Tests: Recent Labs    11/18/23 1448 12/21/23 1448 02/20/24 0750 02/25/24 0000  AST 31 23 22 17   ALT 24 21 19 17   ALKPHOS 87 68 87 80  BILITOT 1.0 1.1 0.5  --   PROT 7.1 7.4 6.7  --   ALBUMIN 3.5 3.8 3.7 4.0   Recent Labs    11/18/23 1448 12/21/23 1448  LIPASE 31 30    CBC: Recent Labs    12/22/23 0537 12/23/23 0224 12/24/23 0418 02/20/24 0750 02/25/24 0000 02/28/24 0000  WBC 7.2 5.4  --  9.3 10.5 9.6  NEUTROABS  --   --   --   --  7,991.00  6,470.00  HGB 14.2 12.9*   < > 14.2 16.3 15.4  HCT 42.5 39.1  --  42.6 49 48  MCV 92.6 93.3  --  92.0  --   --   PLT 226 197  --  290 339 297   < > = values in this interval not displayed.    No results found for: HGBA1C Lab Results  Component Value Date   TSH 0.866 02/21/2018   No results found  for: [id number] Lab Results  Component Value Date   FOLATE 5.2 (L) 08/03/2022     Imaging and Procedures obtained prior to SNF admission: CT HEAD WO CONTRAST ( ) Result Date: 02/20/2024 EXAM: CT HEAD WITHOUT 02/20/2024 08:13:47 AM TECHNIQUE: CT of the head was performed without the administration of intravenous contrast. Automated exposure control, iterative reconstruction, and/or weight based adjustment of the mA/kV was utilized to reduce the radiation dose to as low as reasonably achievable. COMPARISON: CT of the head dated 08/02/2022. CLINICAL HISTORY: Mental status change, unknown cause. FINDINGS: BRAIN AND VENTRICLES: No acute intracranial hemorrhage. No mass effect or midline shift. No extra-axial fluid collection. No evidence of acute infarct. No hydrocephalus. Patchy periventricular and subcortical white matter low-density changes compatible with chronic microvascular ischemic change. Mild diffuse cerebral volume loss. ORBITS: No acute abnormality. SINUSES AND MASTOIDS: No acute abnormality. SOFT TISSUES AND SKULL: No acute skull fracture. No acute soft tissue abnormality. Atherosclerotic calcifications in skull base large vessels. IMPRESSION: 1. No acute intracranial abnormality. 2. Patchy periventricular and subcortical white matter low-density changes compatible with chronic microvascular ischemic change. 3. Mild diffuse cerebral volume loss. Electronically signed by: Evalene Coho MD 02/20/2024 08:22 AM EST RP Workstation: [id number]     Assessment/Plan Physical debility Patient to continue working with physical therapy.  Wife understands that he may not be able to go home.   She is prepared to keep the patient at [facility] for long-term care.  Anxiety Continue with Wellbutrin  and Prozac   Chronic anticoagulation Continue with Eliquis  2.5 mg twice daily  PAF (paroxysmal atrial fibrillation) (HCC) Continue with Eliquis  2.5 mg twice daily.  Malnutrition of moderate degree Continue to monitor nutritional status.  Dietitian to see the patient.  May need to start Remeron 7.5 mg at bedtime to help with appetite stimulation.  Oral phase dysphagia Patient already has seen speech therapy.  He is on a modified diet with small bites.  Chronic bilateral low back pain without sciatica Stable.  Continue with as needed Tylenol   History of pulmonary embolus (PE) Continue with Eliquis  2.5 mg twice daily.  Hyperlipidemia Stable.  Continue Zocor  40 mg daily.  MDD (major depressive disorder), recurrent episode, mild Continue with Prozac  40 mg every morning and Wellbutrin  XL 150 mg every morning.  Can consider dose reduction next year 2026(1st quarter)  MCI (mild cognitive impairment) Continue with Namenda  5 mg nightly  GERD (gastroesophageal reflux disease) Continue with PPI  Benign prostatic hyperplasia Continue with Flomax  and Proscar     Family/ staff Communication: discussed at bedside with his wife Mark [name].   Labs/tests ordered: No orders of the defined types were placed in this encounter.  Medications Discontinued During This Encounter  Medication Reason   magic mouthwash (nystatin , lidocaine , diphenhydrAMINE , alum & mag hydroxide) suspension Duplicate   nystatin  (MYCOSTATIN /NYSTOP ) topical powder    Orders Placed This Encounter  Procedures   CBC and differential    This external order was created through the Results Console.   CBC    This external order was created through the Results Console.   Basic metabolic panel with GFR    This external order was created through the Results Console.   Comprehensive metabolic panel with GFR     This external order was created through the Results Console.   Hepatic function panel    This external order was created through the Results Console.   CBC and differential    This external order was created through the Results Console.  CBC    This external order was created through the Results Console.   Basic metabolic panel with GFR    This external order was created through the Results Console.   Comprehensive metabolic panel with GFR    This external order was created through the Results Console.     Camellia Door, DO [facility] [phone]      [1]  Allergies Allergen Reactions   Ceftriaxone Anaphylaxis and Rash   Donepezil Other (See Comments)    Hallucination, confusion  Other Reaction(s): Hallucinations (code- 2988998, SNOMED CT); Confusional state (code- 713066996, SNOMED CT), Not available  donepezil  Per pt 's wife report:  He goes absolutely nuts    donepezil    Hallucination, confusion   Erythromycin Swelling and Other (See Comments)   Penicillins Swelling and Rash    Has patient had a PCN reaction causing immediate rash, facial/tongue/throat swelling, SOB or lightheadedness with hypotension: Yes  Has patient had a PCN reaction causing severe rash involving mucus membranes or skin necrosis: No  Has patient had a PCN reaction that required hospitalization: Unknown  Has patient had a PCN reaction occurring within the last 10 years: No  If all of the above answers are NO, then may proceed with Cephalosporin use.  Product containing penicillin (product)   Ciprofloxacin Other (See Comments)   Keflex [Cephalexin] Swelling   Penicillamine Other (See Comments)

## 2024-03-02 NOTE — Assessment & Plan Note (Signed)
Stable. Continue Zocor 40 mg daily.

## 2024-03-02 NOTE — Assessment & Plan Note (Signed)
 Continue to monitor nutritional status.  Dietitian to see the patient.  May need to start Remeron 7.5 mg at bedtime to help with appetite stimulation.

## 2024-03-02 NOTE — Assessment & Plan Note (Signed)
 Continue with Eliquis 2.5 mg twice daily.

## 2024-03-02 NOTE — Assessment & Plan Note (Signed)
 Patient already has seen speech therapy.  He is on a modified diet with small bites.

## 2024-03-02 NOTE — Assessment & Plan Note (Signed)
 Stable.  Continue with as needed Tylenol 

## 2024-03-02 NOTE — Assessment & Plan Note (Signed)
 Continue with Wellbutrin  and Prozac 

## 2024-03-02 NOTE — Assessment & Plan Note (Signed)
 Continue with PPI

## 2024-03-20 ENCOUNTER — Encounter: Payer: Self-pay | Admitting: Orthopedic Surgery

## 2024-03-20 ENCOUNTER — Other Ambulatory Visit: Payer: Self-pay | Admitting: Orthopedic Surgery

## 2024-03-20 ENCOUNTER — Non-Acute Institutional Stay (SKILLED_NURSING_FACILITY): Payer: Self-pay | Admitting: Orthopedic Surgery

## 2024-03-20 DIAGNOSIS — I480 Paroxysmal atrial fibrillation: Secondary | ICD-10-CM

## 2024-03-20 DIAGNOSIS — N3941 Urge incontinence: Secondary | ICD-10-CM | POA: Diagnosis not present

## 2024-03-20 DIAGNOSIS — N401 Enlarged prostate with lower urinary tract symptoms: Secondary | ICD-10-CM

## 2024-03-20 DIAGNOSIS — Z86711 Personal history of pulmonary embolism: Secondary | ICD-10-CM | POA: Diagnosis not present

## 2024-03-20 DIAGNOSIS — F03911 Unspecified dementia, unspecified severity, with agitation: Secondary | ICD-10-CM

## 2024-03-20 DIAGNOSIS — F330 Major depressive disorder, recurrent, mild: Secondary | ICD-10-CM | POA: Diagnosis not present

## 2024-03-20 NOTE — Progress Notes (Signed)
 " Location:  Other [place].  Nursing Home Room Number: [facility] [id number] Place of Service:  SNF (31) Provider:  Greig Cluster, NP  PCP: Sadie Manna, MD  Patient Care Team: Sadie Manna, MD as PCP - General (Internal Medicine) Darius Debby Ruth, MD as Referring Physician (Hematology and Oncology)  Extended Emergency Contact Information Primary Emergency Contact: Teska,[name] S Address: [address]          [place], [place] [address] [place] Home Phone: [phone] Mobile Phone: [phone] Relation: Spouse Secondary Emergency Contact: Kiener,Sie  [place] Mobile Phone: [phone] Relation: Son  Code Status:  Full Code Goals of care: Advanced Directive information    03/02/2024    8:36 AM  Advanced Directives  Does Patient Have a Medical Advance Directive? No  Would patient like information on creating a medical advance directive? No - Patient declined     Chief Complaint  Patient presents with   Agitation    Agitation.     HPI:  Pt is a 83 y.o. male seen today for acute visit due to agitation.   He currently resides on the rehab unit at [facility]. PMH: hypotension, PAF, PE, ARF, HLD, GERD, nausea, DDD, OA, BPH, dementia, depression, psychosis and anxiety.   Poor historian due to dementia. Nursing reports increased agitation/ sundowning in early afternoon leading into evening. Behaviors occurred over the weekend. On call was not notified. Behaviors improved when family came to sit with him. Today, behaviors began after wife left to run errands. He began yelling at staff and was unable to be redirected. He is taking Namenda , Prozac  and Wellbutrin  for behaviors.   11/30-12/03 ED evaluation due to weakness and increased confusion. He was found to have UTI and prescribed Bactrim . CT head negative for acute intracranial abnormality, but subcortical white matter changes and mild diffuse cerebral loss noted   Past Medical  History:  Diagnosis Date   Anxiety    Atrial fibrillation (HCC)    Back pain, chronic    Cancer (HCC)    skin   Colon polyp    Cyst of left kidney    Dementia (HCC)    GERD (gastroesophageal reflux disease)    Heart murmur    Hyperlipemia    Mitral valve prolapse    Neuromuscular disorder (HCC)    Neuropathy    Osteoarthritis    Pulmonary emboli (HCC)    Pulmonary embolus (HCC) 09/11/2011   Overview:   Idiopathic pulmonary embolus.   A. Presentation September 2006 with pleuritic chest pain,        shortness of breath, and lightheadedness.   B. Patient symptomatic for one week and subsequently        diagnosed by chest x-ray abnormalities and CT scan confirmation.   C. No risk factors identified for pulmonary embolus.   D. Diagnostic studies:        1. heterozygous for FVL        2. O   Spinal stenosis    Thyroid  nodule    Vertigo    Past Surgical History:  Procedure Laterality Date   APPENDECTOMY     BACK SURGERY     COLONOSCOPY N/A 08/15/2014   Procedure: COLONOSCOPY;  Surgeon: Lamar ONEIDA Holmes, MD;  Location: [facility] ENDOSCOPY;  Service: Endoscopy;  Laterality: N/A;   ESOPHAGOGASTRODUODENOSCOPY N/A 08/15/2014   Procedure: ESOPHAGOGASTRODUODENOSCOPY (EGD);  Surgeon: Lamar ONEIDA Holmes, MD;  Location: [facility] ENDOSCOPY;  Service: Endoscopy;  Laterality: N/A;   ESOPHAGOGASTRODUODENOSCOPY N/A  12/22/2023   Procedure: EGD (ESOPHAGOGASTRODUODENOSCOPY);  Surgeon: Jinny Carmine, MD;  Location: [facility] ENDOSCOPY;  Service: Endoscopy;  Laterality: N/A;   IR KYPHO LUMBAR INC FX REDUCE BONE BX UNI/BIL CANNULATION INC/IMAGING  10/22/2022   IR RADIOLOGIST EVAL & MGMT  10/15/2022   IR RADIOLOGIST EVAL & MGMT  11/03/2022   IR RADIOLOGIST EVAL & MGMT  11/05/2022   TONSILLECTOMY      Allergies[1]  Outpatient Encounter Medications as of 03/20/2024  Medication Sig   apixaban  (ELIQUIS ) 2.5 MG TABS tablet Take 1 tablet by mouth every 12 (twelve) hours.   bisacodyl  (DULCOLAX) 10 MG suppository Place 10 mg  rectally as needed for moderate constipation.   buPROPion  (WELLBUTRIN  XL) 150 MG 24 hr tablet Take 150 mg by mouth daily.   cholecalciferol (VITAMIN D3) 10 MCG (400 UNIT) TABS tablet Take 400 Units by mouth daily.   Coenzyme Q10 (CO Q 10 PO) Take 1 Dose by mouth daily.    Cranberry 250 MG TABS Take 1 tablet (250 mg total) by mouth daily.   Cyanocobalamin  (B-12) 5000 MCG CAPS Take 5,000 mg by mouth daily.   divalproex (DEPAKOTE) 125 MG DR tablet Take 125 mg by mouth 2 (two) times daily.   docusate sodium  (COLACE) 100 MG capsule Take 300 mg by mouth at bedtime.   esomeprazole (NEXIUM) 40 MG capsule Take 1 capsule by mouth daily.   finasteride  (PROSCAR ) 5 MG tablet TAKE 1 TABLET BY MOUTH DAILY   FLUoxetine  (PROZAC ) 40 MG capsule TAKE 2 CAPSULES (80MG  TOTAL) BY MOUTH ONCE DAILY   folic acid  (FOLVITE ) 1 MG tablet Take 1 mg by mouth daily.   lidocaine  (XYLOCAINE ) 4 % external solution Apply 5 mLs topically 4 (four) times daily.   meclizine  (ANTIVERT ) 25 MG tablet Take 1 tablet (25 mg total) by mouth 3 (three) times daily as needed for dizziness.   memantine  (NAMENDA ) 5 MG tablet Take 5 mg by mouth daily.   nystatin  (MYCOSTATIN /NYSTOP ) powder Apply 1 Application topically 3 (three) times daily.   polyethylene glycol (MIRALAX  / GLYCOLAX ) 17 g packet Take 17 g by mouth daily as needed for moderate constipation.   senna (SENOKOT) 8.6 MG TABS tablet Take 1 tablet (8.6 mg total) by mouth daily as needed for mild constipation.   simvastatin  (ZOCOR ) 40 MG tablet Take 40 mg by mouth daily.   tamsulosin  (FLOMAX ) 0.4 MG CAPS capsule Take 1 capsule (0.4 mg total) by mouth daily.   acetaminophen  (TYLENOL ) 325 MG tablet Take 650 mg by mouth every 4 (four) hours as needed. (Patient not taking: Reported on 03/20/2024)   magic mouthwash (lidocaine , diphenhydrAMINE , alum & mag hydroxide) suspension Swish and spit 5 mLs 4 (four) times daily. Apply with oral swab (Patient not taking: Reported on 03/20/2024)    sulfamethoxazole -trimethoprim  (BACTRIM  DS) 800-160 MG tablet Take 1 tablet by mouth 2 (two) times daily. (Patient not taking: Reported on 03/20/2024)   No facility-administered encounter medications on file as of 03/20/2024.    Review of Systems  Unable to perform ROS: Dementia    Immunization History  Administered Date(s) Administered   Fluzone Influenza virus vaccine,trivalent (IIV3), split virus 01/03/2024   INFLUENZA, HIGH DOSE SEASONAL PF 12/07/2018   Influenza Split 01/02/2015   Influenza-Unspecified 01/04/2012, 01/08/2012, 01/18/2013, 12/27/2013, 01/02/2015, 12/06/2015, 12/11/2015, 12/23/2016, 12/31/2017, 11/30/2019, 01/22/2021   PFIZER Comirnaty(Gray Top)Covid-19 Tri-Sucrose Vaccine 04/20/2019, 05/08/2019   PFIZER(Purple Top)SARS-COV-2 Vaccination 04/17/2019, 05/08/2019, 12/25/2019   PNEUMOCOCCAL CONJUGATE-20 03/03/2024   Pneumococcal Conjugate-13 02/24/2016, 07/08/2016   Pneumococcal Polysaccharide-23 05/07/2015  Respiratory Syncytial Virus Vaccine,Recomb Aduvanted(Arexvy) 01/04/2022, 07/02/2022   Tdap 08/23/2013, 08/02/2022   Unspecified SARS-COV-2 Vaccination 03/03/2024   Zoster Recombinant(Shingrix) 07/30/2017, 12/04/2017, 09/02/2018   Zoster, Live 10/03/2015   Pertinent  Health Maintenance Due  Topic Date Due   Influenza Vaccine  Completed      10/13/2020    7:29 AM 12/01/2020   12:36 PM 01/16/2022   12:11 PM 02/23/2024    4:28 PM 02/28/2024    2:15 PM  Fall Risk  Falls in the past year?    1 1  Was there an injury with Fall?    1 1  Fall Risk Category Calculator    3 3  (RETIRED) Patient Fall Risk Level Moderate fall risk  High fall risk  High fall risk     Patient at Risk for Falls Due to    History of fall(s);Impaired balance/gait History of fall(s);Impaired balance/gait  Fall risk Follow up    Falls evaluation completed Falls evaluation completed     Data saved with a previous flowsheet row definition   Functional Status Survey:    Vitals:   03/20/24  1331  BP: 116/75  Pulse: 66  Resp: 16  Temp: 97.7 F (36.5 C)  SpO2: 98%  Weight: 182 lb 6.4 oz (82.7 kg)  Height: 6' 4 (1.93 m)   Body mass index is 22.2 kg/m. Physical Exam Vitals reviewed.  Constitutional:      General: He is not in acute distress. HENT:     Head: Normocephalic.  Eyes:     General:        Right eye: No discharge.        Left eye: No discharge.  Cardiovascular:     Rate and Rhythm: Normal rate and regular rhythm.     Pulses: Normal pulses.     Heart sounds: Normal heart sounds.  Pulmonary:     Effort: Pulmonary effort is normal.     Breath sounds: Normal breath sounds.  Abdominal:     General: Bowel sounds are normal.     Palpations: Abdomen is soft.  Musculoskeletal:     Cervical back: Neck supple.     Right lower leg: No edema.     Left lower leg: No edema.  Skin:    General: Skin is warm.     Capillary Refill: Capillary refill takes less than 2 seconds.  Neurological:     General: No focal deficit present.     Mental Status: He is alert. Mental status is at baseline.     Gait: Gait abnormal.  Psychiatric:        Mood and Affect: Mood normal.     Labs reviewed: Recent Labs    11/18/23 1448 12/21/23 1448 12/23/23 0224 12/24/23 0418 12/25/23 0320 02/20/24 0750 02/25/24 0000 02/28/24 0000  NA 138   < > 137 137 137 136 138 137  K 3.8   < > 3.2* 4.6 4.0 4.0 4.5 4.4  CL 103   < > 106 105 103 102 100 100  CO2 21*   < > 23 26 23 23  28* 28*  GLUCOSE 121*   < > 85 142* 125* 90  --   --   BUN 10   < > 12 12 18 12  22* 30*  CREATININE 0.96   < > 0.78 0.81 0.81 0.84 1.1 0.9  CALCIUM 9.6   < > 8.5* 9.2 8.8* 9.2 9.7 9.0  MG 2.0  --  2.1 2.2  --   --   --   --    < > =  values in this interval not displayed.   Recent Labs    11/18/23 1448 12/21/23 1448 02/20/24 0750 02/25/24 0000  AST 31 23 22 17   ALT 24 21 19 17   ALKPHOS 87 68 87 80  BILITOT 1.0 1.1 0.5  --   PROT 7.1 7.4 6.7  --   ALBUMIN 3.5 3.8 3.7 4.0   Recent Labs     12/22/23 0537 12/23/23 0224 12/24/23 0418 02/20/24 0750 02/25/24 0000 02/28/24 0000  WBC 7.2 5.4  --  9.3 10.5 9.6  NEUTROABS  --   --   --   --  7,991.00 6,470.00  HGB 14.2 12.9*   < > 14.2 16.3 15.4  HCT 42.5 39.1  --  42.6 49 48  MCV 92.6 93.3  --  92.0  --   --   PLT 226 197  --  290 339 297   < > = values in this interval not displayed.   Lab Results  Component Value Date   TSH 0.866 02/21/2018   No results found for: HGBA1C No results found for: CHOL, HDL, LDLCALC, LDLDIRECT, TRIG, CHOLHDL  Significant Diagnostic Results in last 30 days:  CT HEAD WO CONTRAST ( ) Result Date: 02/20/2024 EXAM: CT HEAD WITHOUT 02/20/2024 08:13:47 AM TECHNIQUE: CT of the head was performed without the administration of intravenous contrast. Automated exposure control, iterative reconstruction, and/or weight based adjustment of the mA/kV was utilized to reduce the radiation dose to as low as reasonably achievable. COMPARISON: CT of the head dated 08/02/2022. CLINICAL HISTORY: Mental status change, unknown cause. FINDINGS: BRAIN AND VENTRICLES: No acute intracranial hemorrhage. No mass effect or midline shift. No extra-axial fluid collection. No evidence of acute infarct. No hydrocephalus. Patchy periventricular and subcortical white matter low-density changes compatible with chronic microvascular ischemic change. Mild diffuse cerebral volume loss. ORBITS: No acute abnormality. SINUSES AND MASTOIDS: No acute abnormality. SOFT TISSUES AND SKULL: No acute skull fracture. No acute soft tissue abnormality. Atherosclerotic calcifications in skull base large vessels. IMPRESSION: 1. No acute intracranial abnormality. 2. Patchy periventricular and subcortical white matter low-density changes compatible with chronic microvascular ischemic change. 3. Mild diffuse cerebral volume loss. Electronically signed by: Evalene Coho MD 02/20/2024 08:22 AM EST RP Workstation: [id number]     Assessment/Plan 1. Agitation due to dementia [facility]) (Primary) - began 12/27 & 12/28 - sundowning in early afternoon  - not easily redirected - start Depakote 125 mg po BID - increase Namenda  to 5 mg BID - start Ativan  0.5 mg po BID PRN for agitation  - if no improvement consider Seroquel  or Rexulti  - hepatic level in 2 weeks - 12/03> UTI> completed Bactrim  - repeat UA/culture   2. PAF (paroxysmal atrial fibrillation) (HCC) - HR controlled without medication  - cont Eliquis   3. History of pulmonary embolus (PE) - cont Eliquis   4. 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 MDD (major depressive disorder), recurrent episode, mild - cont Prozac  and Wellbutrin      Family/ staff Communication: plan discussed with patient  wife and nurse  Labs/tests ordered:  UA/culture, hepatic panel in 2 weeks         [1]  Allergies Allergen Reactions   Ceftriaxone Anaphylaxis and Rash   Donepezil Other (See Comments)    Hallucination, confusion  Other Reaction(s): Hallucinations (code- 2988998, SNOMED CT); Confusional state (code- 713066996, SNOMED CT), Not available  donepezil  Per pt 's wife report:  He goes absolutely nuts  donepezil    Hallucination, confusion   Erythromycin Swelling and Other (See Comments)   Penicillins Swelling and Rash    Has patient had a PCN reaction causing immediate rash, facial/tongue/throat swelling, SOB or lightheadedness with hypotension: Yes  Has patient had a PCN reaction causing severe rash involving mucus membranes or skin necrosis: No  Has patient had a PCN reaction that required hospitalization: Unknown  Has patient had a PCN reaction occurring within the last 10 years: No  If all of the above answers are NO, then may proceed with Cephalosporin use.  Product containing penicillin (product)   Ciprofloxacin Other (See Comments)   Keflex [Cephalexin] Swelling    Penicillamine Other (See Comments)   "

## 2024-03-22 ENCOUNTER — Other Ambulatory Visit: Payer: Self-pay | Admitting: Orthopedic Surgery

## 2024-03-22 MED ORDER — LORAZEPAM 0.5 MG PO TABS: 0.5000 mg | ORAL_TABLET | Freq: Every day | ORAL | 1 refills | Status: DC

## 2024-03-27 ENCOUNTER — Encounter: Payer: Self-pay | Admitting: Orthopedic Surgery

## 2024-03-27 DIAGNOSIS — I480 Paroxysmal atrial fibrillation: Secondary | ICD-10-CM | POA: Diagnosis not present

## 2024-03-27 DIAGNOSIS — F330 Major depressive disorder, recurrent, mild: Secondary | ICD-10-CM | POA: Diagnosis not present

## 2024-03-27 DIAGNOSIS — N3941 Urge incontinence: Secondary | ICD-10-CM | POA: Diagnosis not present

## 2024-03-27 DIAGNOSIS — F03911 Unspecified dementia, unspecified severity, with agitation: Secondary | ICD-10-CM | POA: Diagnosis not present

## 2024-03-27 DIAGNOSIS — N401 Enlarged prostate with lower urinary tract symptoms: Secondary | ICD-10-CM

## 2024-03-27 MED ORDER — LORAZEPAM 0.5 MG PO TABS: 0.5000 mg | ORAL_TABLET | Freq: Three times a day (TID) | ORAL | 0 refills | Status: DC | PRN

## 2024-03-27 NOTE — Progress Notes (Signed)
 " Location:  Other Nursing Home Room Number: [facility] [id number] Place of Service:    Provider:  Greig FORBES Cluster, NP   Sadie Manna, MD  Patient Care Team: Sadie Manna, MD as PCP - General (Internal Medicine) Darius, Debby Ruth, MD as Referring Physician (Hematology and Oncology)  Extended Emergency Contact Information Primary Emergency Contact: Placeres,[name] S Address: [address]          [place], [place] [address] [place] Home Phone: [phone] Mobile Phone: [phone] Relation: Spouse Secondary Emergency Contact: Tornow,Jonty  [place] Mobile Phone: [phone] Relation: Son  Code Status:  DNR Goals of care: Advanced Directive information    03/02/2024    8:36 AM  Advanced Directives  Does Patient Have a Medical Advance Directive? No  Would patient like information on creating a medical advance directive? No - Patient declined     Chief Complaint  Patient presents with   Acute Visit    Agitation     HPI:  Pt is a 84 y.o. male seen today for acute visit due to agitation.   He currently resides on the rehab unit at [facility]. PMH: hypotension, PAF, PE, ARF, HLD, GERD, nausea, DDD, OA, BPH, dementia, depression, psychosis and anxiety.   Increased agitation/ sundowning in late afternoon x 9 days. 12/29 Depakote prescribed. 01/01 Seroquel  prescribed. He continues to yell in late afternoon. He will swing at staff and family members. He is also trying to ambulate when he is unable to without assistance. 12/30 UA unremarkable. Vitals stable.   Treatment options discussed with wife.    Past Medical History:  Diagnosis Date   Anxiety    Atrial fibrillation (HCC)    Back pain, chronic    Cancer (HCC)    skin   Colon polyp    Cyst of left kidney    Dementia (HCC)    GERD (gastroesophageal reflux disease)    Heart murmur    Hyperlipemia    Mitral valve prolapse    Neuromuscular disorder (HCC)    Neuropathy     Osteoarthritis    Pulmonary emboli (HCC)    Pulmonary embolus (HCC) 09/11/2011   Overview:   Idiopathic pulmonary embolus.   A. Presentation September 2006 with pleuritic chest pain,        shortness of breath, and lightheadedness.   B. Patient symptomatic for one week and subsequently        diagnosed by chest x-ray abnormalities and CT scan confirmation.   C. No risk factors identified for pulmonary embolus.   D. Diagnostic studies:        1. heterozygous for FVL        2. O   Spinal stenosis    Thyroid  nodule    Vertigo    Past Surgical History:  Procedure Laterality Date   APPENDECTOMY     BACK SURGERY     COLONOSCOPY N/A 08/15/2014   Procedure: COLONOSCOPY;  Surgeon: Lamar ONEIDA Holmes, MD;  Location: [facility] ENDOSCOPY;  Service: Endoscopy;  Laterality: N/A;   ESOPHAGOGASTRODUODENOSCOPY N/A 08/15/2014   Procedure: ESOPHAGOGASTRODUODENOSCOPY (EGD);  Surgeon: Lamar ONEIDA Holmes, MD;  Location: [facility] ENDOSCOPY;  Service: Endoscopy;  Laterality: N/A;   ESOPHAGOGASTRODUODENOSCOPY N/A 12/22/2023   Procedure: EGD (ESOPHAGOGASTRODUODENOSCOPY);  Surgeon: Jinny Carmine, MD;  Location: [facility] ENDOSCOPY;  Service: Endoscopy;  Laterality: N/A;   IR KYPHO LUMBAR INC FX REDUCE BONE BX UNI/BIL CANNULATION INC/IMAGING  10/22/2022   IR RADIOLOGIST EVAL & MGMT  10/15/2022   IR  RADIOLOGIST EVAL & MGMT  11/03/2022   IR RADIOLOGIST EVAL & MGMT  11/05/2022   TONSILLECTOMY      Allergies[1]  Outpatient Encounter Medications as of 03/27/2024  Medication Sig   apixaban  (ELIQUIS ) 2.5 MG TABS tablet Take 1 tablet by mouth every 12 (twelve) hours.   bisacodyl  (DULCOLAX) 10 MG suppository Place 10 mg rectally as needed for moderate constipation.   buPROPion  (WELLBUTRIN  XL) 150 MG 24 hr tablet Take 150 mg by mouth daily.   cholecalciferol (VITAMIN D3) 10 MCG (400 UNIT) TABS tablet Take 400 Units by mouth daily.   Coenzyme Q10 (CO Q 10 PO) Take 1 Dose by mouth daily.    Cranberry 250 MG TABS Take 1 tablet (250 mg total) by mouth  daily.   Cyanocobalamin  (B-12) 5000 MCG CAPS Take 5,000 mg by mouth daily.   divalproex (DEPAKOTE) 125 MG DR tablet Take 125 mg by mouth 2 (two) times daily.   docusate sodium  (COLACE) 100 MG capsule Take 300 mg by mouth at bedtime.   esomeprazole (NEXIUM) 40 MG capsule Take 1 capsule by mouth daily.   finasteride  (PROSCAR ) 5 MG tablet TAKE 1 TABLET BY MOUTH DAILY   FLUoxetine  (PROZAC ) 40 MG capsule TAKE 2 CAPSULES (80MG  TOTAL) BY MOUTH ONCE DAILY   folic acid  (FOLVITE ) 1 MG tablet Take 1 mg by mouth daily.   lidocaine  (XYLOCAINE ) 4 % external solution Apply 5 mLs topically 4 (four) times daily.   LORazepam  (ATIVAN ) 0.5 MG tablet Take 1 tablet (0.5 mg total) by mouth daily. Give at 2 pm with Depakote for agitation   meclizine  (ANTIVERT ) 25 MG tablet Take 1 tablet (25 mg total) by mouth 3 (three) times daily as needed for dizziness.   memantine  (NAMENDA ) 5 MG tablet Take 5 mg by mouth 2 (two) times daily.   nystatin  (MYCOSTATIN /NYSTOP ) powder Apply 1 Application topically 3 (three) times daily.   polyethylene glycol (MIRALAX  / GLYCOLAX ) 17 g packet Take 17 g by mouth daily as needed for moderate constipation.   senna (SENOKOT) 8.6 MG TABS tablet Take 1 tablet (8.6 mg total) by mouth daily as needed for mild constipation.   simvastatin  (ZOCOR ) 40 MG tablet Take 40 mg by mouth daily.   tamsulosin  (FLOMAX ) 0.4 MG CAPS capsule Take 1 capsule (0.4 mg total) by mouth daily.   No facility-administered encounter medications on file as of 03/27/2024.    Review of Systems  Unable to perform ROS: Dementia    Immunization History  Administered Date(s) Administered   Fluzone Influenza virus vaccine,trivalent (IIV3), split virus 01/03/2024   INFLUENZA, HIGH DOSE SEASONAL PF 12/07/2018   Influenza Split 01/02/2015   Influenza-Unspecified 01/04/2012, 01/08/2012, 01/18/2013, 12/27/2013, 01/02/2015, 12/06/2015, 12/11/2015, 12/23/2016, 12/31/2017, 11/30/2019, 01/22/2021   PFIZER Comirnaty(Gray Top)Covid-19  Tri-Sucrose Vaccine 04/20/2019, 05/08/2019   PFIZER(Purple Top)SARS-COV-2 Vaccination 04/17/2019, 05/08/2019, 12/25/2019   PNEUMOCOCCAL CONJUGATE-20 03/03/2024   Pneumococcal Conjugate-13 02/24/2016, 07/08/2016   Pneumococcal Polysaccharide-23 05/07/2015   Respiratory Syncytial Virus Vaccine,Recomb Aduvanted(Arexvy) 01/04/2022, 07/02/2022   Tdap 08/23/2013, 08/02/2022   Unspecified SARS-COV-2 Vaccination 03/03/2024   Zoster Recombinant(Shingrix) 07/30/2017, 12/04/2017, 09/02/2018   Zoster, Live 10/03/2015   Pertinent  Health Maintenance Due  Topic Date Due   Influenza Vaccine  Completed      10/13/2020    7:29 AM 12/01/2020   12:36 PM 01/16/2022   12:11 PM 02/23/2024    4:28 PM 02/28/2024    2:15 PM  Fall Risk  Falls in the past year?    1 1  Was there an  injury with Fall?    1 1  Fall Risk Category Calculator    3 3  (RETIRED) Patient Fall Risk Level Moderate fall risk  High fall risk  High fall risk     Patient at Risk for Falls Due to    History of fall(s);Impaired balance/gait History of fall(s);Impaired balance/gait  Fall risk Follow up    Falls evaluation completed Falls evaluation completed     Data saved with a previous flowsheet row definition   Functional Status Survey:    Vitals:   03/27/24 1222  BP: 115/61  Pulse: 83  Resp: 18  Temp: (!) 97.4 F (36.3 C)  SpO2: 94%  Weight: 184 lb 6.4 oz (83.6 kg)  Height: 6' 4 (1.93 m)   Body mass index is 22.45 kg/m. Physical Exam Vitals reviewed.  Constitutional:      General: He is not in acute distress.    Appearance: He is not ill-appearing.  HENT:     Head: Normocephalic and atraumatic.  Eyes:     General:        Right eye: No discharge.        Left eye: No discharge.  Cardiovascular:     Rate and Rhythm: Normal rate. Rhythm irregular.     Pulses: Normal pulses.     Heart sounds: Normal heart sounds.  Pulmonary:     Effort: Pulmonary effort is normal.     Breath sounds: Normal breath sounds.   Abdominal:     General: Bowel sounds are normal. There is no distension.     Palpations: Abdomen is soft.     Tenderness: There is no abdominal tenderness.  Musculoskeletal:     Cervical back: Neck supple.     Right lower leg: No edema.     Left lower leg: No edema.  Skin:    General: Skin is warm.     Capillary Refill: Capillary refill takes less than 2 seconds.  Neurological:     General: No focal deficit present.     Mental Status: He is alert. Mental status is at baseline.     Motor: No weakness.     Gait: Gait normal.  Psychiatric:        Mood and Affect: Mood normal.     Comments: Alert to self/person, follows commands     Labs reviewed: Recent Labs    11/18/23 1448 12/21/23 1448 12/23/23 0224 12/24/23 0418 12/25/23 0320 02/20/24 0750 02/25/24 0000 02/28/24 0000  NA 138   < > 137 137 137 136 138 137  K 3.8   < > 3.2* 4.6 4.0 4.0 4.5 4.4  CL 103   < > 106 105 103 102 100 100  CO2 21*   < > 23 26 23 23  28* 28*  GLUCOSE 121*   < > 85 142* 125* 90  --   --   BUN 10   < > 12 12 18 12  22* 30*  CREATININE 0.96   < > 0.78 0.81 0.81 0.84 1.1 0.9  CALCIUM 9.6   < > 8.5* 9.2 8.8* 9.2 9.7 9.0  MG 2.0  --  2.1 2.2  --   --   --   --    < > = values in this interval not displayed.   Recent Labs    11/18/23 1448 12/21/23 1448 02/20/24 0750 02/25/24 0000  AST 31 23 22 17   ALT 24 21 19 17   ALKPHOS 87 68 87 80  BILITOT 1.0  1.1 0.5  --   PROT 7.1 7.4 6.7  --   ALBUMIN 3.5 3.8 3.7 4.0   Recent Labs    12/22/23 0537 12/23/23 0224 12/24/23 0418 02/20/24 0750 02/25/24 0000 02/28/24 0000  WBC 7.2 5.4  --  9.3 10.5 9.6  NEUTROABS  --   --   --   --  7,991.00 6,470.00  HGB 14.2 12.9*   < > 14.2 16.3 15.4  HCT 42.5 39.1  --  42.6 49 48  MCV 92.6 93.3  --  92.0  --   --   PLT 226 197  --  290 339 297   < > = values in this interval not displayed.   Lab Results  Component Value Date   TSH 0.866 02/21/2018   No results found for: HGBA1C No results found for:  CHOL, HDL, LDLCALC, LDLDIRECT, TRIG, CHOLHDL  Significant Diagnostic Results in last 30 days:  No results found.  Assessment/Plan 1. 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 PAF (paroxysmal atrial fibrillation) (HCC) - HR< 100 without medication - cont Eliquis    3. MDD (major depressive disorder), recurrent episode, mild - see above - cont Prozac  and Wellbutrin    4. 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 - cont finasteride  and tamsulosin      Family/ staff Communication: plan discussed with wife and nurse  Labs/tests ordered:  none       [1]  Allergies Allergen Reactions   Ceftriaxone Anaphylaxis and Rash   Donepezil Other (See Comments)    Hallucination, confusion  Other Reaction(s): Hallucinations (code- 2988998, SNOMED CT); Confusional state (code- 713066996, SNOMED CT), Not available  donepezil  Per pt 's wife report:  He goes absolutely nuts    donepezil    Hallucination, confusion   Erythromycin Swelling and Other (See Comments)   Penicillins Swelling and Rash    Has patient had a PCN reaction causing immediate rash, facial/tongue/throat swelling, SOB or lightheadedness with hypotension: Yes  Has patient had a PCN reaction causing severe rash involving mucus membranes or skin necrosis: No  Has patient had a PCN reaction that required hospitalization: Unknown  Has patient had a PCN reaction occurring within the last 10 years: No  If all of the above answers are NO, then may proceed with Cephalosporin use.  Product containing penicillin (product)   Ciprofloxacin Other (See Comments)   Keflex [Cephalexin] Swelling   Penicillamine Other (See  Comments)   "

## 2024-04-03 ENCOUNTER — Non-Acute Institutional Stay (SKILLED_NURSING_FACILITY): Payer: Self-pay | Admitting: Orthopedic Surgery

## 2024-04-03 ENCOUNTER — Telehealth: Admitting: Psychiatry

## 2024-04-03 ENCOUNTER — Encounter: Payer: Self-pay | Admitting: Orthopedic Surgery

## 2024-04-03 ENCOUNTER — Telehealth: Payer: Self-pay | Admitting: Psychiatry

## 2024-04-03 DIAGNOSIS — K219 Gastro-esophageal reflux disease without esophagitis: Secondary | ICD-10-CM

## 2024-04-03 DIAGNOSIS — R1311 Dysphagia, oral phase: Secondary | ICD-10-CM

## 2024-04-03 DIAGNOSIS — F03911 Unspecified dementia, unspecified severity, with agitation: Secondary | ICD-10-CM | POA: Diagnosis not present

## 2024-04-03 DIAGNOSIS — Z86711 Personal history of pulmonary embolism: Secondary | ICD-10-CM | POA: Diagnosis not present

## 2024-04-03 DIAGNOSIS — N3941 Urge incontinence: Secondary | ICD-10-CM

## 2024-04-03 DIAGNOSIS — I480 Paroxysmal atrial fibrillation: Secondary | ICD-10-CM | POA: Diagnosis not present

## 2024-04-03 DIAGNOSIS — F330 Major depressive disorder, recurrent, mild: Secondary | ICD-10-CM | POA: Diagnosis not present

## 2024-04-03 DIAGNOSIS — N401 Enlarged prostate with lower urinary tract symptoms: Secondary | ICD-10-CM

## 2024-04-03 DIAGNOSIS — R5381 Other malaise: Secondary | ICD-10-CM

## 2024-04-03 LAB — COMPREHENSIVE METABOLIC PANEL WITH GFR
Albumin: 3.1 — AB (ref 3.5–5.0)
Globulin: 2.4

## 2024-04-03 LAB — HEPATIC FUNCTION PANEL
ALT: 12 U/L (ref 10–40)
AST: 15 (ref 14–40)
Alkaline Phosphatase: 69 (ref 25–125)
Bilirubin, Total: 0.4

## 2024-04-03 NOTE — Progress Notes (Unsigned)
 " Location:  [facility] [place].  Nursing Home Room Number: [facility] [id number] Place of Service:  SNF (31) Provider:  Greig Cluster, NP  PCP: Sadie Manna, MD  Patient Care Team: Sadie Manna, MD as PCP - General (Internal Medicine) Darius, Debby Ruth, MD as Referring Physician (Hematology and Oncology)  Extended Emergency Contact Information Primary Emergency Contact: Martorana,[name] S Address: [address]          [place], [place] [address] [place] Home Phone: [phone] Mobile Phone: [phone] Relation: Spouse Secondary Emergency Contact: Rebello,Kerby  [place] Mobile Phone: [phone] Relation: Son  Code Status:  Full Code.  Goals of care: Advanced Directive information    03/02/2024    8:36 AM  Advanced Directives  Does Patient Have a Medical Advance Directive? No  Would patient like information on creating a medical advance directive? No - Patient declined     Chief Complaint  Patient presents with   Medical Management of Chronic Issues    Medical Management of Chronic Issues.     HPI:  Pt is a 84 y.o. male seen today for medical management of chronic diseases.    He currently resides on the rehab unit at [facility] PMH: hypotension, PAF, PE, ARF, HLD, GERD, nausea, DDD, OA, BPH, dementia, depression, psychosis and anxiety.   PAF- TSH .219 01/11/2024, remains on Eliquis  H/o PE- see above  Agitation with dementia- increased agitation since 12/29, behaviors worsen after wife leaves> yell our for wife> redirected by staff most times, family has hired psychologist, sport and exercise, now on Depakote, Seroquel , ativan  prn and Namenda , ALT/AST 12/15 04/03/2024 BPH- remains on finasteride  and tamsulosin   Dysphagia- no recent aspirations, followed by ST, remains on regular diet with bite sized meats  Depression- see above, remains on Wellbutrin  and Prozac  GERD- hgb 16.3 02/25/2024, remains on esomeprazole  Debility- slow progression with  PT/OT  Recent weights:  01/07- 195.4 lbs  12/17- 181.2 lbs  12/03- 183.6 lbs  Recent blood pressures:  01/13- 103/62  01/10- 129/79  01/09- 128/67       Past Medical History:  Diagnosis Date   Anxiety    Atrial fibrillation (HCC)    Back pain, chronic    Cancer (HCC)    skin   Colon polyp    Cyst of left kidney    Dementia (HCC)    GERD (gastroesophageal reflux disease)    Heart murmur    Hyperlipemia    Mitral valve prolapse    Neuromuscular disorder (HCC)    Neuropathy    Osteoarthritis    Pulmonary emboli (HCC)    Pulmonary embolus (HCC) 09/11/2011   Overview:   Idiopathic pulmonary embolus.   A. Presentation September 2006 with pleuritic chest pain,        shortness of breath, and lightheadedness.   B. Patient symptomatic for one week and subsequently        diagnosed by chest x-ray abnormalities and CT scan confirmation.   C. No risk factors identified for pulmonary embolus.   D. Diagnostic studies:        1. heterozygous for FVL        2. O   Spinal stenosis    Thyroid  nodule    Vertigo    Past Surgical History:  Procedure Laterality Date   APPENDECTOMY     BACK SURGERY     COLONOSCOPY N/A 08/15/2014   Procedure: COLONOSCOPY;  Surgeon: Lamar ONEIDA Holmes, MD;  Location: [facility] ENDOSCOPY;  Service: Endoscopy;  Laterality: N/A;   ESOPHAGOGASTRODUODENOSCOPY N/A 08/15/2014   Procedure: ESOPHAGOGASTRODUODENOSCOPY (EGD);  Surgeon: Lamar ONEIDA Holmes, MD;  Location: [facility] ENDOSCOPY;  Service: Endoscopy;  Laterality: N/A;   ESOPHAGOGASTRODUODENOSCOPY N/A 12/22/2023   Procedure: EGD (ESOPHAGOGASTRODUODENOSCOPY);  Surgeon: Jinny Carmine, MD;  Location: [facility] ENDOSCOPY;  Service: Endoscopy;  Laterality: N/A;   IR KYPHO LUMBAR INC FX REDUCE BONE BX UNI/BIL CANNULATION INC/IMAGING  10/22/2022   IR RADIOLOGIST EVAL & MGMT  10/15/2022   IR RADIOLOGIST EVAL & MGMT  11/03/2022   IR RADIOLOGIST EVAL & MGMT  11/05/2022   TONSILLECTOMY      Allergies[1]  Outpatient Encounter Medications  as of 04/03/2024  Medication Sig   apixaban  (ELIQUIS ) 2.5 MG TABS tablet Take 1 tablet by mouth every 12 (twelve) hours.   bisacodyl  (DULCOLAX) 10 MG suppository Place 10 mg rectally as needed for moderate constipation.   buPROPion  (WELLBUTRIN  XL) 150 MG 24 hr tablet Take 150 mg by mouth daily.   cholecalciferol (VITAMIN D3) 10 MCG (400 UNIT) TABS tablet Take 400 Units by mouth daily.   Coenzyme Q10 (CO Q 10 PO) Take 1 Dose by mouth daily.    Cranberry 250 MG TABS Take 1 tablet (250 mg total) by mouth daily.   Cyanocobalamin  (B-12) 5000 MCG CAPS Take 5,000 mg by mouth daily.   divalproex (DEPAKOTE) 125 MG DR tablet Take 250 mg by mouth 2 (two) times daily. Give at 0800 and 1400   docusate sodium  (COLACE) 100 MG capsule Take 300 mg by mouth at bedtime.   esomeprazole (NEXIUM) 40 MG capsule Take 1 capsule by mouth daily.   finasteride  (PROSCAR ) 5 MG tablet TAKE 1 TABLET BY MOUTH DAILY   FLUoxetine  (PROZAC ) 40 MG capsule TAKE 2 CAPSULES (80MG  TOTAL) BY MOUTH ONCE DAILY   folic acid  (FOLVITE ) 1 MG tablet Take 1 mg by mouth daily.   lidocaine  (XYLOCAINE ) 4 % external solution Apply 5 mLs topically 4 (four) times daily.   LORazepam  (ATIVAN ) 0.5 MG tablet Take 1 tablet (0.5 mg total) by mouth every 8 (eight) hours as needed for up to 14 days for anxiety. Give at 2 pm with Depakote for agitation   meclizine  (ANTIVERT ) 25 MG tablet Take 1 tablet (25 mg total) by mouth 3 (three) times daily as needed for dizziness.   memantine  (NAMENDA ) 5 MG tablet Take 5 mg by mouth 2 (two) times daily.   nystatin  (MYCOSTATIN /NYSTOP ) powder Apply 1 Application topically 3 (three) times daily.   polyethylene glycol (MIRALAX  / GLYCOLAX ) 17 g packet Take 17 g by mouth daily as needed for moderate constipation.   QUEtiapine  (SEROQUEL ) 25 MG tablet Take 25 mg by mouth 2 (two) times daily. Give at 1400 and 2100   senna (SENOKOT) 8.6 MG TABS tablet Take 1 tablet (8.6 mg total) by mouth daily as needed for mild constipation.    simvastatin  (ZOCOR ) 40 MG tablet Take 40 mg by mouth daily.   tamsulosin  (FLOMAX ) 0.4 MG CAPS capsule Take 1 capsule (0.4 mg total) by mouth daily.   No facility-administered encounter medications on file as of 04/03/2024.    Review of Systems  Unable to perform ROS: Dementia    Immunization History  Administered Date(s) Administered   Fluzone Influenza virus vaccine,trivalent (IIV3), split virus 01/03/2024   INFLUENZA, HIGH DOSE SEASONAL PF 12/07/2018   Influenza Split 01/02/2015   Influenza-Unspecified 01/04/2012, 01/08/2012, 01/18/2013, 12/27/2013, 01/02/2015, 12/06/2015, 12/11/2015, 12/23/2016, 12/31/2017, 11/30/2019, 01/22/2021   PFIZER Comirnaty(Gray Top)Covid-19 Tri-Sucrose Vaccine 04/20/2019, 05/08/2019   PFIZER(Purple Top)SARS-COV-2 Vaccination 04/17/2019,  05/08/2019, 12/25/2019   PNEUMOCOCCAL CONJUGATE-20 03/03/2024   Pneumococcal Conjugate-13 02/24/2016, 07/08/2016   Pneumococcal Polysaccharide-23 05/07/2015   Respiratory Syncytial Virus Vaccine,Recomb Aduvanted(Arexvy) 01/04/2022, 07/02/2022   Tdap 08/23/2013, 08/02/2022   Unspecified SARS-COV-2 Vaccination 03/03/2024   Zoster Recombinant(Shingrix) 07/30/2017, 12/04/2017, 09/02/2018   Zoster, Live 10/03/2015   Pertinent  Health Maintenance Due  Topic Date Due   Influenza Vaccine  Completed      12/01/2020   12:36 PM 01/16/2022   12:11 PM 02/23/2024    4:28 PM 02/28/2024    2:15 PM 03/27/2024   12:43 PM  Fall Risk  Falls in the past year?   1 1 1   Was there an injury with Fall?   1 1 1   Fall Risk Category Calculator   3 3 3   (RETIRED) Patient Fall Risk Level High fall risk  High fall risk      Patient at Risk for Falls Due to   History of fall(s);Impaired balance/gait History of fall(s);Impaired balance/gait History of fall(s);Impaired balance/gait  Fall risk Follow up   Falls evaluation completed Falls evaluation completed Falls evaluation completed     Data saved with a previous flowsheet row definition    Functional Status Survey:    Vitals:   04/03/24 1322  BP: 129/79  Pulse: 67  Resp: 17  Temp: (!) 97.2 F (36.2 C)  SpO2: 94%  Weight: 195 lb 6.4 oz (88.6 kg)  Height: 6' 4 (1.93 m)   Body mass index is 23.78 kg/m. Physical Exam Vitals reviewed.  Constitutional:      General: He is not in acute distress. HENT:     Head: Normocephalic.  Eyes:     General:        Right eye: No discharge.        Left eye: No discharge.  Cardiovascular:     Rate and Rhythm: Normal rate. Rhythm irregular.     Pulses: Normal pulses.     Heart sounds: Normal heart sounds.  Pulmonary:     Effort: Pulmonary effort is normal. No respiratory distress.     Breath sounds: Normal breath sounds. No wheezing or rales.  Abdominal:     General: Bowel sounds are normal. There is no distension.     Palpations: Abdomen is soft.     Tenderness: There is no abdominal tenderness.  Musculoskeletal:     Cervical back: Neck supple.     Right lower leg: No edema.     Left lower leg: No edema.  Skin:    General: Skin is warm.     Capillary Refill: Capillary refill takes less than 2 seconds.  Neurological:     General: No focal deficit present.     Mental Status: He is alert. Mental status is at baseline.     Gait: Gait abnormal.  Psychiatric:        Mood and Affect: Mood normal.     Comments: Aphasia, alert to self/familiar face, follows some commands     Labs reviewed: Recent Labs    11/18/23 1448 12/21/23 1448 12/23/23 0224 12/24/23 0418 12/25/23 0320 02/20/24 0750 02/25/24 0000 02/28/24 0000  NA 138   < > 137 137 137 136 138 137  K 3.8   < > 3.2* 4.6 4.0 4.0 4.5 4.4  CL 103   < > 106 105 103 102 100 100  CO2 21*   < > 23 26 23 23  28* 28*  GLUCOSE 121*   < > 85 142* 125* 90  --   --  BUN 10   < > 12 12 18 12  22* 30*  CREATININE 0.96   < > 0.78 0.81 0.81 0.84 1.1 0.9  CALCIUM 9.6   < > 8.5* 9.2 8.8* 9.2 9.7 9.0  MG 2.0  --  2.1 2.2  --   --   --   --    < > = values in this interval  not displayed.   Recent Labs    11/18/23 1448 12/21/23 1448 02/20/24 0750 02/25/24 0000 04/03/24 0000  AST 31 23 22 17 15   ALT 24 21 19 17 12   ALKPHOS 87 68 87 80 69  BILITOT 1.0 1.1 0.5  --   --   PROT 7.1 7.4 6.7  --   --   ALBUMIN 3.5 3.8 3.7 4.0 3.1*   Recent Labs    12/22/23 0537 12/23/23 0224 12/24/23 0418 02/20/24 0750 02/25/24 0000 02/28/24 0000  WBC 7.2 5.4  --  9.3 10.5 9.6  NEUTROABS  --   --   --   --  7,991.00 6,470.00  HGB 14.2 12.9*   < > 14.2 16.3 15.4  HCT 42.5 39.1  --  42.6 49 48  MCV 92.6 93.3  --  92.0  --   --   PLT 226 197  --  290 339 297   < > = values in this interval not displayed.   Lab Results  Component Value Date   TSH 0.866 02/21/2018   No results found for: HGBA1C No results found for: CHOL, HDL, LDLCALC, LDLDIRECT, TRIG, CHOLHDL  Significant Diagnostic Results in last 30 days:  No results found.  Assessment/Plan 1. PAF (paroxysmal atrial fibrillation) (HCC) (Primary) - HR< 100 without medication - cont Eliquis    2. History of pulmonary embolus (PE) - cont Eliquis   3. 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 Oral phase dysphagia - no aspirations - cont bite sized meats  6. MDD (major depressive disorder), recurrent episode, mild - see above - Na+ stable - cont Wellbutrin  and Prozac   7. Gastroesophageal reflux disease without esophagitis - hgb stable  - cont esomeprazole  8. Physical debility - cont OT/PT     Family/ staff Communication: plan discussed with patient and nurse  Labs/tests ordered:  none        [1]  Allergies Allergen Reactions   Ceftriaxone Anaphylaxis and Rash    Donepezil Other (See Comments)    Hallucination, confusion  Other Reaction(s): Hallucinations (code- 2988998, SNOMED CT); Confusional state (code- 713066996, SNOMED CT), Not available  donepezil  Per pt 's wife report:  He goes absolutely nuts    donepezil    Hallucination, confusion   Erythromycin Swelling and Other (See Comments)   Penicillins Swelling and Rash    Has patient had a PCN reaction causing immediate rash, facial/tongue/throat swelling, SOB or lightheadedness with hypotension: Yes  Has patient had a PCN reaction causing severe rash involving mucus membranes or skin necrosis: No  Has patient had a PCN reaction that required hospitalization: Unknown  Has patient had a PCN reaction occurring within the last 10 years: No  If all of the above answers are NO, then may proceed with Cephalosporin use.  Product containing penicillin (product)   Ciprofloxacin Other (See Comments)   Keflex [Cephalexin] Swelling   Penicillamine Other (See Comments)   "

## 2024-04-03 NOTE — Telephone Encounter (Signed)
 Received letter from spouse stating patient is currently at a SNF , [facility]  and to hold off any follow up appointments for now.

## 2024-04-07 ENCOUNTER — Non-Acute Institutional Stay (SKILLED_NURSING_FACILITY): Payer: Self-pay | Admitting: Orthopedic Surgery

## 2024-04-07 ENCOUNTER — Encounter: Payer: Self-pay | Admitting: Orthopedic Surgery

## 2024-04-07 DIAGNOSIS — F03911 Unspecified dementia, unspecified severity, with agitation: Secondary | ICD-10-CM

## 2024-04-07 DIAGNOSIS — R5381 Other malaise: Secondary | ICD-10-CM

## 2024-04-07 DIAGNOSIS — F330 Major depressive disorder, recurrent, mild: Secondary | ICD-10-CM | POA: Diagnosis not present

## 2024-04-07 DIAGNOSIS — K219 Gastro-esophageal reflux disease without esophagitis: Secondary | ICD-10-CM

## 2024-04-07 NOTE — Progress Notes (Signed)
 " Location:  Other Nursing Home Room Number: [facility] [id number] Place of Service:  SNF (31) Provider:  Greig FORBES Cluster, NP   Mark Manna, MD  Patient Care Team: Mark Manna, MD as PCP - General (Internal Medicine) Darius Debby Ruth, MD as Referring Physician (Hematology and Oncology)  Extended Emergency Contact Information Primary Emergency Contact: Keir,[name] S Address: [address]          [place], [place] [address] [place] Home Phone: [phone] Mobile Phone: [phone] Relation: Spouse Secondary Emergency Contact: Elsayed,Garen  [place] Mobile Phone: [phone] Relation: Son  Code Status:  Full code Goals of care: Advanced Directive information    03/02/2024    8:36 AM  Advanced Directives  Does Patient Have a Medical Advance Directive? No  Would patient like information on creating a medical advance directive? No - Patient declined     Chief Complaint  Patient presents with   Acute Visit    agitation    HPI:  Pt is a 84 y.o. male seen today for medical management of chronic diseases.    He currently resides on the rehab unit at [facility]. PMH: hypotension, PAF, PE, ARF, HLD, GERD, nausea, DDD, OA, BPH, dementia, depression, psychosis and anxiety.   Agitation with dementia- increased agitation since 12/29, behaviors worsen after wife leaves> yell our for wife> redirected by staff most times, family has hired psychologist, sport and exercise, now on Depakote, Seroquel , ativan  prn and Namenda , ALT/AST 12/15 04/03/2024  Depression- see above, remains on Wellbutrin  and Prozac  GERD- hgb 16.3 02/25/2024, remains on esomeprazole  Debility- slow progression with PT/OT  Continues to yell in the evening and night, not easily redirected. He was given 2 doses ativan  prn last night with some improvement in mood/anxiety. He is often observed calling out for wife. Wife has been trying to visit at same time daily. Nursing staff is bringing him out  of room more often to eat and attend activities. Behaviors improved when he is in common areas per nursing.      Past Medical History:  Diagnosis Date   Anxiety    Atrial fibrillation (HCC)    Back pain, chronic    Cancer (HCC)    skin   Colon polyp    Cyst of left kidney    Dementia (HCC)    GERD (gastroesophageal reflux disease)    Heart murmur    Hyperlipemia    Mitral valve prolapse    Neuromuscular disorder (HCC)    Neuropathy    Osteoarthritis    Pulmonary emboli (HCC)    Pulmonary embolus (HCC) 09/11/2011   Overview:   Idiopathic pulmonary embolus.   A. Presentation September 2006 with pleuritic chest pain,        shortness of breath, and lightheadedness.   B. Patient symptomatic for one week and subsequently        diagnosed by chest x-ray abnormalities and CT scan confirmation.   C. No risk factors identified for pulmonary embolus.   D. Diagnostic studies:        1. heterozygous for FVL        2. O   Spinal stenosis    Thyroid  nodule    Vertigo    Past Surgical History:  Procedure Laterality Date   APPENDECTOMY     BACK SURGERY     COLONOSCOPY N/A 08/15/2014   Procedure: COLONOSCOPY;  Surgeon: Lamar ONEIDA Holmes, MD;  Location: [facility] ENDOSCOPY;  Service: Endoscopy;  Laterality: N/A;  ESOPHAGOGASTRODUODENOSCOPY N/A 08/15/2014   Procedure: ESOPHAGOGASTRODUODENOSCOPY (EGD);  Surgeon: Lamar ONEIDA Holmes, MD;  Location: [facility] ENDOSCOPY;  Service: Endoscopy;  Laterality: N/A;   ESOPHAGOGASTRODUODENOSCOPY N/A 12/22/2023   Procedure: EGD (ESOPHAGOGASTRODUODENOSCOPY);  Surgeon: Jinny Carmine, MD;  Location: [facility] ENDOSCOPY;  Service: Endoscopy;  Laterality: N/A;   IR KYPHO LUMBAR INC FX REDUCE BONE BX UNI/BIL CANNULATION INC/IMAGING  10/22/2022   IR RADIOLOGIST EVAL & MGMT  10/15/2022   IR RADIOLOGIST EVAL & MGMT  11/03/2022   IR RADIOLOGIST EVAL & MGMT  11/05/2022   TONSILLECTOMY      Allergies[1]  Outpatient Encounter Medications as of 04/07/2024  Medication Sig   apixaban   (ELIQUIS ) 2.5 MG TABS tablet Take 1 tablet by mouth every 12 (twelve) hours.   bisacodyl  (DULCOLAX) 10 MG suppository Place 10 mg rectally as needed for moderate constipation.   buPROPion  (WELLBUTRIN  XL) 150 MG 24 hr tablet Take 150 mg by mouth daily.   cholecalciferol (VITAMIN D3) 10 MCG (400 UNIT) TABS tablet Take 400 Units by mouth daily.   Coenzyme Q10 (CO Q 10 PO) Take 1 Dose by mouth daily.    Cranberry 250 MG TABS Take 1 tablet (250 mg total) by mouth daily.   Cyanocobalamin  (B-12) 5000 MCG CAPS Take 5,000 mg by mouth daily.   divalproex (DEPAKOTE) 125 MG DR tablet Take 250 mg by mouth 2 (two) times daily. Give at 0800 and 1400   docusate sodium  (COLACE) 100 MG capsule Take 300 mg by mouth at bedtime.   esomeprazole (NEXIUM) 40 MG capsule Take 1 capsule by mouth daily.   finasteride  (PROSCAR ) 5 MG tablet TAKE 1 TABLET BY MOUTH DAILY   FLUoxetine  (PROZAC ) 40 MG capsule TAKE 2 CAPSULES (80MG  TOTAL) BY MOUTH ONCE DAILY   folic acid  (FOLVITE ) 1 MG tablet Take 1 mg by mouth daily.   lidocaine  (XYLOCAINE ) 4 % external solution Apply 5 mLs topically 4 (four) times daily.   LORazepam  (ATIVAN ) 0.5 MG tablet Take 1 tablet (0.5 mg total) by mouth every 8 (eight) hours as needed for up to 14 days for anxiety. Give at 2 pm with Depakote for agitation   meclizine  (ANTIVERT ) 25 MG tablet Take 1 tablet (25 mg total) by mouth 3 (three) times daily as needed for dizziness.   memantine  (NAMENDA ) 5 MG tablet Take 5 mg by mouth 2 (two) times daily.   nystatin  (MYCOSTATIN /NYSTOP ) powder Apply 1 Application topically 3 (three) times daily.   polyethylene glycol (MIRALAX  / GLYCOLAX ) 17 g packet Take 17 g by mouth daily as needed for moderate constipation.   QUEtiapine  (SEROQUEL ) 25 MG tablet Take 25 mg by mouth 2 (two) times daily. Give at 1400 and 2100   senna (SENOKOT) 8.6 MG TABS tablet Take 1 tablet (8.6 mg total) by mouth daily as needed for mild constipation.   simvastatin  (ZOCOR ) 40 MG tablet Take 40 mg  by mouth daily.   tamsulosin  (FLOMAX ) 0.4 MG CAPS capsule Take 1 capsule (0.4 mg total) by mouth daily.   No facility-administered encounter medications on file as of 04/07/2024.    Review of Systems  Unable to perform ROS: Dementia    Immunization History  Administered Date(s) Administered   Fluzone Influenza virus vaccine,trivalent (IIV3), split virus 01/03/2024   INFLUENZA, HIGH DOSE SEASONAL PF 12/07/2018   Influenza Split 01/02/2015   Influenza-Unspecified 01/04/2012, 01/08/2012, 01/18/2013, 12/27/2013, 01/02/2015, 12/06/2015, 12/11/2015, 12/23/2016, 12/31/2017, 11/30/2019, 01/22/2021   PFIZER Comirnaty(Gray Top)Covid-19 Tri-Sucrose Vaccine 04/20/2019, 05/08/2019   PFIZER(Purple Top)SARS-COV-2 Vaccination 04/17/2019, 05/08/2019, 12/25/2019  PNEUMOCOCCAL CONJUGATE-20 03/03/2024   Pneumococcal Conjugate-13 02/24/2016, 07/08/2016   Pneumococcal Polysaccharide-23 05/07/2015   Respiratory Syncytial Virus Vaccine,Recomb Aduvanted(Arexvy) 01/04/2022, 07/02/2022   Tdap 08/23/2013, 08/02/2022   Unspecified SARS-COV-2 Vaccination 03/03/2024   Zoster Recombinant(Shingrix) 07/30/2017, 12/04/2017, 09/02/2018   Zoster, Live 10/03/2015   Pertinent  Health Maintenance Due  Topic Date Due   Influenza Vaccine  Completed      01/16/2022   12:11 PM 02/23/2024    4:28 PM 02/28/2024    2:15 PM 03/27/2024   12:43 PM 04/04/2024    1:40 PM  Fall Risk  Falls in the past year?  1 1 1 1   Was there an injury with Fall?  1 1 1 1   Fall Risk Category Calculator  3 3 3 3   (RETIRED) Patient Fall Risk Level High fall risk       Patient at Risk for Falls Due to  History of fall(s);Impaired balance/gait History of fall(s);Impaired balance/gait History of fall(s);Impaired balance/gait History of fall(s);Impaired balance/gait  Fall risk Follow up  Falls evaluation completed Falls evaluation completed Falls evaluation completed Falls evaluation completed     Data saved with a previous flowsheet row definition    Functional Status Survey:    Vitals:   04/07/24 1556  BP: 132/70  Pulse: 80  Resp: 18  Temp: (!) 97.4 F (36.3 C)  SpO2: 96%  Weight: 196 lb 6.4 oz (89.1 kg)  Height: 6' 4 (1.93 m)   Body mass index is 23.91 kg/m. Physical Exam Vitals reviewed.  Constitutional:      General: He is not in acute distress. HENT:     Head: Normocephalic and atraumatic.  Eyes:     General:        Right eye: No discharge.        Left eye: No discharge.  Cardiovascular:     Rate and Rhythm: Normal rate and regular rhythm.     Pulses: Normal pulses.     Heart sounds: Normal heart sounds.  Pulmonary:     Effort: Pulmonary effort is normal. No respiratory distress.     Breath sounds: Normal breath sounds. No wheezing or rales.  Abdominal:     General: Bowel sounds are normal.     Palpations: Abdomen is soft.  Musculoskeletal:     Cervical back: Neck supple.     Right lower leg: No edema.     Left lower leg: No edema.  Skin:    General: Skin is warm.     Capillary Refill: Capillary refill takes less than 2 seconds.  Neurological:     General: No focal deficit present.     Mental Status: He is alert. Mental status is at baseline.     Gait: Gait abnormal.  Psychiatric:        Mood and Affect: Mood normal.     Comments: Repetitive speech, phrases, alert to self/person, follows some commands     Labs reviewed: Recent Labs    11/18/23 1448 12/21/23 1448 12/23/23 0224 12/24/23 0418 12/25/23 0320 02/20/24 0750 02/25/24 0000 02/28/24 0000  NA 138   < > 137 137 137 136 138 137  K 3.8   < > 3.2* 4.6 4.0 4.0 4.5 4.4  CL 103   < > 106 105 103 102 100 100  CO2 21*   < > 23 26 23 23  28* 28*  GLUCOSE 121*   < > 85 142* 125* 90  --   --   BUN 10   < >  12 12 18 12  22* 30*  CREATININE 0.96   < > 0.78 0.81 0.81 0.84 1.1 0.9  CALCIUM 9.6   < > 8.5* 9.2 8.8* 9.2 9.7 9.0  MG 2.0  --  2.1 2.2  --   --   --   --    < > = values in this interval not displayed.   Recent Labs     11/18/23 1448 12/21/23 1448 02/20/24 0750 02/25/24 0000 04/03/24 0000  AST 31 23 22 17 15   ALT 24 21 19 17 12   ALKPHOS 87 68 87 80 69  BILITOT 1.0 1.1 0.5  --   --   PROT 7.1 7.4 6.7  --   --   ALBUMIN 3.5 3.8 3.7 4.0 3.1*   Recent Labs    12/22/23 0537 12/23/23 0224 12/24/23 0418 02/20/24 0750 02/25/24 0000 02/28/24 0000  WBC 7.2 5.4  --  9.3 10.5 9.6  NEUTROABS  --   --   --   --  7,991.00 6,470.00  HGB 14.2 12.9*   < > 14.2 16.3 15.4  HCT 42.5 39.1  --  42.6 49 48  MCV 92.6 93.3  --  92.0  --   --   PLT 226 197  --  290 339 297   < > = values in this interval not displayed.   Lab Results  Component Value Date   TSH 0.866 02/21/2018   No results found for: HGBA1C No results found for: CHOL, HDL, LDLCALC, LDLDIRECT, TRIG, CHOLHDL  Significant Diagnostic Results in last 30 days:  No results found.  Assessment/Plan 1. Agitation due to dementia [facility]) (Primary) - ongoing, onset 12/29 - labs and UA/culture unremarkable - started on Depakote, then Seroquel  - behaviors after wife leaves - continues to yell in evening, not redirected, 2 doses ativan  last night  - will increase seroquel  to 37.5 mg in evening  - if no improvement with depakote/seroquel > consider Rexulti  - cont Namenda , ativan  prn   2. MDD (major depressive disorder), recurrent episode, mild - see above  - ? Anxiety - plan to wean off wellbutrin > will reduce to 75 mg  - cont Prozac   3. Gastroesophageal reflux disease without esophagitis - hgb stable with esomeprazole  4. Physical debility - cont PT/OT    Family/ staff Communication: plan discussed with nurse  Labs/tests ordered:  none      [1]  Allergies Allergen Reactions   Ceftriaxone Anaphylaxis and Rash   Donepezil Other (See Comments)    Hallucination, confusion  Other Reaction(s): Hallucinations (code- 2988998, SNOMED CT); Confusional state (code- 713066996, SNOMED CT), Not available  donepezil  Per pt 's  wife report:  He goes absolutely nuts    donepezil    Hallucination, confusion   Erythromycin Swelling and Other (See Comments)   Penicillins Swelling and Rash    Has patient had a PCN reaction causing immediate rash, facial/tongue/throat swelling, SOB or lightheadedness with hypotension: Yes  Has patient had a PCN reaction causing severe rash involving mucus membranes or skin necrosis: No  Has patient had a PCN reaction that required hospitalization: Unknown  Has patient had a PCN reaction occurring within the last 10 years: No  If all of the above answers are NO, then may proceed with Cephalosporin use.  Product containing penicillin (product)   Ciprofloxacin Other (See Comments)   Keflex [Cephalexin] Swelling   Penicillamine Other (See Comments)   "

## 2024-04-10 ENCOUNTER — Other Ambulatory Visit: Payer: Self-pay | Admitting: Orthopedic Surgery

## 2024-04-10 DIAGNOSIS — F03911 Unspecified dementia, unspecified severity, with agitation: Secondary | ICD-10-CM

## 2024-04-10 MED ORDER — LORAZEPAM 0.5 MG PO TABS: 0.5000 mg | ORAL_TABLET | Freq: Three times a day (TID) | ORAL | 0 refills | Status: DC | PRN

## 2024-04-13 ENCOUNTER — Non-Acute Institutional Stay (SKILLED_NURSING_FACILITY): Payer: Self-pay | Admitting: Nurse Practitioner

## 2024-04-13 ENCOUNTER — Encounter: Payer: Self-pay | Admitting: Nurse Practitioner

## 2024-04-13 DIAGNOSIS — R5381 Other malaise: Secondary | ICD-10-CM

## 2024-04-13 DIAGNOSIS — F419 Anxiety disorder, unspecified: Secondary | ICD-10-CM

## 2024-04-13 DIAGNOSIS — F03911 Unspecified dementia, unspecified severity, with agitation: Secondary | ICD-10-CM

## 2024-04-13 NOTE — Progress Notes (Signed)
 " Location:  Other Nursing Home Room Number: [facility] [id number] Place of Service:  SNF (31)  Mark Manna, MD  Patient Care Team: Mark Manna, MD as PCP - General (Internal Medicine) Darius, Debby Ruth, MD as Referring Physician (Hematology and Oncology)  Extended Emergency Contact Information Primary Emergency Contact: Anstead,[name] S Address: [address]          [place], [place] [address] [place] Home Phone: [phone] Mobile Phone: [phone] Relation: Spouse Secondary Emergency Contact: Rutan,Roe  [place] Mobile Phone: [phone] Relation: Son  Goals of care: Advanced Directive information    03/02/2024    8:36 AM  Advanced Directives  Does Patient Have a Medical Advance Directive? No  Would patient like information on creating a medical advance directive? No - Patient declined     Chief Complaint  Patient presents with   Anxiety    Anxiety/Depression.     HPI:  Pt is a 84 y.o. male seen today for an acute visit for anxiety.  Pt with hx of dementia with agitation. He is at [facility] for rehab but plan is to stay for long term care per wife as he is very unstable and unable to care for himself at home.  He feels 'terrible' about his mood, experiencing significant anxiety and depression. He feels distressed because he believes his wife will not allow him to return home, and he feels restricted and helpless as a result. He wants to 'end it all' due to his current situation, although he denies any physical pain. His primary wish is to return home and live independently however due to dementia and debility he is unable to do so.   He is currently taking fluoxetine  80 mg daily for mood management. Previously, he was on Wellbutrin , which has been reduced to 75 mg. He has been on fluoxetine  for years and does not recall any other medications for anxiety or depression.  He is also on Namenda  5 mg twice a day for memory  issues. He acknowledges short-term memory loss, which his wife has also noted, contributing to his agitation and difficulty understanding his situation.    Past Medical History:  Diagnosis Date   Anxiety    Atrial fibrillation (HCC)    Back pain, chronic    Cancer (HCC)    skin   Colon polyp    Cyst of left kidney    Dementia (HCC)    GERD (gastroesophageal reflux disease)    Heart murmur    Hyperlipemia    Mitral valve prolapse    Neuromuscular disorder (HCC)    Neuropathy    Osteoarthritis    Pulmonary emboli (HCC)    Pulmonary embolus (HCC) 09/11/2011   Overview:   Idiopathic pulmonary embolus.   A. Presentation September 2006 with pleuritic chest pain,        shortness of breath, and lightheadedness.   B. Patient symptomatic for one week and subsequently        diagnosed by chest x-ray abnormalities and CT scan confirmation.   C. No risk factors identified for pulmonary embolus.   D. Diagnostic studies:        1. heterozygous for FVL        2. O   Spinal stenosis    Thyroid  nodule    Vertigo    Past Surgical History:  Procedure Laterality Date   APPENDECTOMY     BACK SURGERY     COLONOSCOPY N/A 08/15/2014   Procedure: COLONOSCOPY;  Surgeon: Lamar ONEIDA Holmes, MD;  Location: [facility] ENDOSCOPY;  Service: Endoscopy;  Laterality: N/A;   ESOPHAGOGASTRODUODENOSCOPY N/A 08/15/2014   Procedure: ESOPHAGOGASTRODUODENOSCOPY (EGD);  Surgeon: Lamar ONEIDA Holmes, MD;  Location: [facility] ENDOSCOPY;  Service: Endoscopy;  Laterality: N/A;   ESOPHAGOGASTRODUODENOSCOPY N/A 12/22/2023   Procedure: EGD (ESOPHAGOGASTRODUODENOSCOPY);  Surgeon: Jinny Carmine, MD;  Location: [facility] ENDOSCOPY;  Service: Endoscopy;  Laterality: N/A;   IR KYPHO LUMBAR INC FX REDUCE BONE BX UNI/BIL CANNULATION INC/IMAGING  10/22/2022   IR RADIOLOGIST EVAL & MGMT  10/15/2022   IR RADIOLOGIST EVAL & MGMT  11/03/2022   IR RADIOLOGIST EVAL & MGMT  11/05/2022   TONSILLECTOMY      Allergies[1]  Outpatient Encounter Medications as of  04/13/2024  Medication Sig   apixaban  (ELIQUIS ) 2.5 MG TABS tablet Take 1 tablet by mouth every 12 (twelve) hours.   bisacodyl  (DULCOLAX) 10 MG suppository Place 10 mg rectally as needed for moderate constipation.   buPROPion  (WELLBUTRIN  XL) 150 MG 24 hr tablet Take 150 mg by mouth daily. (Patient taking differently: Take 75 mg by mouth daily.)   cholecalciferol (VITAMIN D3) 10 MCG (400 UNIT) TABS tablet Take 400 Units by mouth daily.   Coenzyme Q10 (CO Q 10 PO) Take 1 Dose by mouth daily.    Cranberry 250 MG TABS Take 1 tablet (250 mg total) by mouth daily.   Cyanocobalamin  (B-12) 5000 MCG CAPS Take 5,000 mg by mouth daily.   divalproex (DEPAKOTE) 125 MG DR tablet Take 250 mg by mouth 2 (two) times daily. Give at 0800 and 1400   docusate sodium  (COLACE) 100 MG capsule Take 300 mg by mouth at bedtime.   esomeprazole (NEXIUM) 40 MG capsule Take 1 capsule by mouth daily.   finasteride  (PROSCAR ) 5 MG tablet TAKE 1 TABLET BY MOUTH DAILY   folic acid  (FOLVITE ) 1 MG tablet Take 1 mg by mouth daily.   LORazepam  (ATIVAN ) 0.5 MG tablet Take 1 tablet (0.5 mg total) by mouth every 8 (eight) hours as needed for up to 14 days for anxiety. Give at 2 pm with Depakote for agitation   meclizine  (ANTIVERT ) 25 MG tablet Take 1 tablet (25 mg total) by mouth 3 (three) times daily as needed for dizziness.   memantine  (NAMENDA ) 5 MG tablet Take 5 mg by mouth 2 (two) times daily.   nystatin  (MYCOSTATIN /NYSTOP ) powder Apply 1 Application topically 3 (three) times daily.   polyethylene glycol (MIRALAX  / GLYCOLAX ) 17 g packet Take 17 g by mouth daily as needed for moderate constipation.   FLUoxetine  (PROZAC ) 40 MG capsule TAKE 2 CAPSULES (80MG  TOTAL) BY MOUTH ONCE DAILY   lidocaine  (XYLOCAINE ) 4 % external solution Apply 5 mLs topically 4 (four) times daily.   QUEtiapine  (SEROQUEL ) 25 MG tablet Take 25 mg by mouth 2 (two) times daily. Give at 1400 and 2100   senna (SENOKOT) 8.6 MG TABS tablet Take 1 tablet (8.6 mg total)  by mouth daily as needed for mild constipation.   simvastatin  (ZOCOR ) 40 MG tablet Take 40 mg by mouth daily.   tamsulosin  (FLOMAX ) 0.4 MG CAPS capsule Take 1 capsule (0.4 mg total) by mouth daily.   No facility-administered encounter medications on file as of 04/13/2024.    Review of Systems  Constitutional:  Negative for activity change, appetite change, fatigue and unexpected weight change.  HENT:  Negative for congestion and hearing loss.   Eyes: Negative.   Respiratory:  Negative for cough and shortness of breath.   Cardiovascular:  Negative for chest  pain, palpitations and leg swelling.  Gastrointestinal:  Negative for abdominal pain, constipation and diarrhea.  Genitourinary:  Negative for difficulty urinating and dysuria.  Musculoskeletal:  Negative for arthralgias and myalgias.  Skin:  Negative for color change and wound.  Neurological:  Positive for weakness. Negative for dizziness.  Psychiatric/Behavioral:  Negative for agitation, behavioral problems and confusion.     Immunization History  Administered Date(s) Administered   Fluzone Influenza virus vaccine,trivalent (IIV3), split virus 01/03/2024   INFLUENZA, HIGH DOSE SEASONAL PF 12/07/2018   Influenza Split 01/02/2015   Influenza-Unspecified 01/04/2012, 01/08/2012, 01/18/2013, 12/27/2013, 01/02/2015, 12/06/2015, 12/11/2015, 12/23/2016, 12/31/2017, 11/30/2019, 01/22/2021   PFIZER Comirnaty(Gray Top)Covid-19 Tri-Sucrose Vaccine 04/20/2019, 05/08/2019   PFIZER(Purple Top)SARS-COV-2 Vaccination 04/17/2019, 05/08/2019, 12/25/2019   PNEUMOCOCCAL CONJUGATE-20 03/03/2024   Pneumococcal Conjugate-13 02/24/2016, 07/08/2016   Pneumococcal Polysaccharide-23 05/07/2015   Respiratory Syncytial Virus Vaccine,Recomb Aduvanted(Arexvy) 01/04/2022, 07/02/2022   Tdap 08/23/2013, 08/02/2022   Unspecified SARS-COV-2 Vaccination 03/03/2024   Zoster Recombinant(Shingrix) 07/30/2017, 12/04/2017, 09/02/2018   Zoster, Live 10/03/2015    Pertinent  Health Maintenance Due  Topic Date Due   Influenza Vaccine  Completed      01/16/2022   12:11 PM 02/23/2024    4:28 PM 02/28/2024    2:15 PM 03/27/2024   12:43 PM 04/04/2024    1:40 PM  Fall Risk  Falls in the past year?  1 1 1 1   Was there an injury with Fall?  1 1 1 1   Fall Risk Category Calculator  3 3 3 3   (RETIRED) Patient Fall Risk Level High fall risk       Patient at Risk for Falls Due to  History of fall(s);Impaired balance/gait History of fall(s);Impaired balance/gait History of fall(s);Impaired balance/gait History of fall(s);Impaired balance/gait  Fall risk Follow up  Falls evaluation completed Falls evaluation completed Falls evaluation completed Falls evaluation completed     Data saved with a previous flowsheet row definition   Functional Status Survey:    Vitals:   04/13/24 1127  BP: 133/80  Pulse: 76  Resp: 18  Temp: (!) 97.3 F (36.3 C)  SpO2: 94%  Weight: 192 lb 6.4 oz (87.3 kg)  Height: 6' 4 (1.93 m)   Body mass index is 23.42 kg/m. Physical Exam Constitutional:      Appearance: Normal appearance.  Neurological:     Mental Status: He is alert. Mental status is at baseline.     Motor: Weakness present.     Gait: Gait abnormal.  Psychiatric:        Mood and Affect: Mood is anxious. Affect is angry.        Cognition and Memory: Memory is impaired.     Labs reviewed: Recent Labs    11/18/23 1448 12/21/23 1448 12/23/23 0224 12/24/23 0418 12/25/23 0320 02/20/24 0750 02/25/24 0000 02/28/24 0000  NA 138   < > 137 137 137 136 138 137  K 3.8   < > 3.2* 4.6 4.0 4.0 4.5 4.4  CL 103   < > 106 105 103 102 100 100  CO2 21*   < > 23 26 23 23  28* 28*  GLUCOSE 121*   < > 85 142* 125* 90  --   --   BUN 10   < > 12 12 18 12  22* 30*  CREATININE 0.96   < > 0.78 0.81 0.81 0.84 1.1 0.9  CALCIUM 9.6   < > 8.5* 9.2 8.8* 9.2 9.7 9.0  MG 2.0  --  2.1 2.2  --   --   --   --    < > =  values in this interval not displayed.   Recent Labs     11/18/23 1448 12/21/23 1448 02/20/24 0750 02/25/24 0000 04/03/24 0000  AST 31 23 22 17 15   ALT 24 21 19 17 12   ALKPHOS 87 68 87 80 69  BILITOT 1.0 1.1 0.5  --   --   PROT 7.1 7.4 6.7  --   --   ALBUMIN 3.5 3.8 3.7 4.0 3.1*   Recent Labs    12/22/23 0537 12/23/23 0224 12/24/23 0418 02/20/24 0750 02/25/24 0000 02/28/24 0000  WBC 7.2 5.4  --  9.3 10.5 9.6  NEUTROABS  --   --   --   --  7,991.00 6,470.00  HGB 14.2 12.9*   < > 14.2 16.3 15.4  HCT 42.5 39.1  --  42.6 49 48  MCV 92.6 93.3  --  92.0  --   --   PLT 226 197  --  290 339 297   < > = values in this interval not displayed.   Lab Results  Component Value Date   TSH 0.866 02/21/2018   No results found for: HGBA1C No results found for: CHOL, HDL, LDLCALC, LDLDIRECT, TRIG, CHOLHDL  Significant Diagnostic Results in last 30 days:  No results found.  Assessment/Plan  Anxiety Ongoing, will titrate off Wellbutrin  as it can be contributing to worsening anxiety.  Will stop prozac  and switch to Lexapro for better management. Consider SNRIs if SSRIs ineffective. - Discontinue Wellbutrin . -Lexapro after reducing fluoxetine . -continues PRN ativan   Dementia with agitation Short-term memory deficits contribute to anxiety and agitation. Namenda  increase suggested to enhance memory and reduce anxiety and agitation. - Increase Namenda  to 10 mg twice daily. -may need rexulti to also help with this Continues on seroquel  and depakote   Physical debility Ongoing with frequent falls at home. Per wife will be at [facility] for long term care.   Keileigh Vahey K. Caro BODILY [facility] [phone]      [1]  Allergies Allergen Reactions   Ceftriaxone Anaphylaxis and Rash   Donepezil Other (See Comments)    Hallucination, confusion  Other Reaction(s): Hallucinations (code- 2988998, SNOMED CT); Confusional state (code- 713066996, SNOMED CT), Not available  donepezil  Per pt 's wife  report:  He goes absolutely nuts    donepezil    Hallucination, confusion   Erythromycin Swelling and Other (See Comments)   Penicillins Swelling and Rash    Has patient had a PCN reaction causing immediate rash, facial/tongue/throat swelling, SOB or lightheadedness with hypotension: Yes  Has patient had a PCN reaction causing severe rash involving mucus membranes or skin necrosis: No  Has patient had a PCN reaction that required hospitalization: Unknown  Has patient had a PCN reaction occurring within the last 10 years: No  If all of the above answers are NO, then may proceed with Cephalosporin use.  Product containing penicillin (product)   Ciprofloxacin Other (See Comments)   Keflex [Cephalexin] Swelling   Penicillamine Other (See Comments)   "

## 2024-04-13 NOTE — Progress Notes (Deleted)
 " Location:  Other [facility].  Nursing Home Room Number: [facility] [id number] Place of Service:  SNF (31) Harlene An, NP  PCP: Sadie Manna, MD  Patient Care Team: Sadie Manna, MD as PCP - General (Internal Medicine) Darius, Debby Ruth, MD as Referring Physician (Hematology and Oncology)  Extended Emergency Contact Information Primary Emergency Contact: Shishido,[name] S Address: [address]          [place], [place] [address] [place] Home Phone: [phone] Mobile Phone: [phone] Relation: Spouse Secondary Emergency Contact: Delmar,Isley  [place] Mobile Phone: [phone] Relation: Son  Goals of care: Advanced Directive information    03/02/2024    8:36 AM  Advanced Directives  Does Patient Have a Medical Advance Directive? No  Would patient like information on creating a medical advance directive? No - Patient declined     Chief Complaint  Patient presents with   Anxiety    Anxiety/Depression.     HPI:  Pt is a 84 y.o. male seen today for an acute visit for Anxiety/Depression.    Past Medical History:  Diagnosis Date   Anxiety    Atrial fibrillation (HCC)    Back pain, chronic    Cancer (HCC)    skin   Colon polyp    Cyst of left kidney    Dementia (HCC)    GERD (gastroesophageal reflux disease)    Heart murmur    Hyperlipemia    Mitral valve prolapse    Neuromuscular disorder (HCC)    Neuropathy    Osteoarthritis    Pulmonary emboli (HCC)    Pulmonary embolus (HCC) 09/11/2011   Overview:   Idiopathic pulmonary embolus.   A. Presentation September 2006 with pleuritic chest pain,        shortness of breath, and lightheadedness.   B. Patient symptomatic for one week and subsequently        diagnosed by chest x-ray abnormalities and CT scan confirmation.   C. No risk factors identified for pulmonary embolus.   D. Diagnostic studies:        1. heterozygous for FVL        2. O   Spinal stenosis    Thyroid   nodule    Vertigo    Past Surgical History:  Procedure Laterality Date   APPENDECTOMY     BACK SURGERY     COLONOSCOPY N/A 08/15/2014   Procedure: COLONOSCOPY;  Surgeon: Lamar ONEIDA Holmes, MD;  Location: [facility] ENDOSCOPY;  Service: Endoscopy;  Laterality: N/A;   ESOPHAGOGASTRODUODENOSCOPY N/A 08/15/2014   Procedure: ESOPHAGOGASTRODUODENOSCOPY (EGD);  Surgeon: Lamar ONEIDA Holmes, MD;  Location: [facility] ENDOSCOPY;  Service: Endoscopy;  Laterality: N/A;   ESOPHAGOGASTRODUODENOSCOPY N/A 12/22/2023   Procedure: EGD (ESOPHAGOGASTRODUODENOSCOPY);  Surgeon: Jinny Carmine, MD;  Location: [facility] ENDOSCOPY;  Service: Endoscopy;  Laterality: N/A;   IR KYPHO LUMBAR INC FX REDUCE BONE BX UNI/BIL CANNULATION INC/IMAGING  10/22/2022   IR RADIOLOGIST EVAL & MGMT  10/15/2022   IR RADIOLOGIST EVAL & MGMT  11/03/2022   IR RADIOLOGIST EVAL & MGMT  11/05/2022   TONSILLECTOMY      Allergies[1]  Outpatient Encounter Medications as of 04/13/2024  Medication Sig   apixaban  (ELIQUIS ) 2.5 MG TABS tablet Take 1 tablet by mouth every 12 (twelve) hours.   bisacodyl  (DULCOLAX) 10 MG suppository Place 10 mg rectally as needed for moderate constipation.   buPROPion  (WELLBUTRIN  XL) 150 MG 24 hr tablet Take 150 mg by mouth daily. (Patient taking differently: Take 75 mg by  mouth daily.)   cholecalciferol (VITAMIN D3) 10 MCG (400 UNIT) TABS tablet Take 400 Units by mouth daily.   Coenzyme Q10 (CO Q 10 PO) Take 1 Dose by mouth daily.    Cranberry 250 MG TABS Take 1 tablet (250 mg total) by mouth daily.   Cyanocobalamin  (B-12) 5000 MCG CAPS Take 5,000 mg by mouth daily.   divalproex (DEPAKOTE) 125 MG DR tablet Take 250 mg by mouth 2 (two) times daily. Give at 0800 and 1400   docusate sodium  (COLACE) 100 MG capsule Take 300 mg by mouth at bedtime.   esomeprazole (NEXIUM) 40 MG capsule Take 1 capsule by mouth daily.   finasteride  (PROSCAR ) 5 MG tablet TAKE 1 TABLET BY MOUTH DAILY   FLUoxetine  (PROZAC ) 40 MG capsule TAKE 2 CAPSULES (80MG   TOTAL) BY MOUTH ONCE DAILY   folic acid  (FOLVITE ) 1 MG tablet Take 1 mg by mouth daily.   LORazepam  (ATIVAN ) 0.5 MG tablet Take 1 tablet (0.5 mg total) by mouth every 8 (eight) hours as needed for up to 14 days for anxiety. Give at 2 pm with Depakote for agitation   meclizine  (ANTIVERT ) 25 MG tablet Take 1 tablet (25 mg total) by mouth 3 (three) times daily as needed for dizziness.   memantine  (NAMENDA ) 5 MG tablet Take 5 mg by mouth 2 (two) times daily.   nystatin  (MYCOSTATIN /NYSTOP ) powder Apply 1 Application topically 3 (three) times daily.   polyethylene glycol (MIRALAX  / GLYCOLAX ) 17 g packet Take 17 g by mouth daily as needed for moderate constipation.   QUEtiapine  (SEROQUEL ) 25 MG tablet Take 25 mg by mouth 2 (two) times daily. Give at 1400 and 2100 (Patient taking differently: Take 37.5 mg by mouth at bedtime.)   senna (SENOKOT) 8.6 MG TABS tablet Take 1 tablet (8.6 mg total) by mouth daily as needed for mild constipation.   simvastatin  (ZOCOR ) 40 MG tablet Take 40 mg by mouth daily.   tamsulosin  (FLOMAX ) 0.4 MG CAPS capsule Take 1 capsule (0.4 mg total) by mouth daily.   lidocaine  (XYLOCAINE ) 4 % external solution Apply 5 mLs topically 4 (four) times daily. (Patient not taking: Reported on 04/13/2024)   No facility-administered encounter medications on file as of 04/13/2024.    Review of Systems***  Immunization History  Administered Date(s) Administered   Fluzone Influenza virus vaccine,trivalent (IIV3), split virus 01/03/2024   INFLUENZA, HIGH DOSE SEASONAL PF 12/07/2018   Influenza Split 01/02/2015   Influenza-Unspecified 01/04/2012, 01/08/2012, 01/18/2013, 12/27/2013, 01/02/2015, 12/06/2015, 12/11/2015, 12/23/2016, 12/31/2017, 11/30/2019, 01/22/2021   PFIZER Comirnaty(Gray Top)Covid-19 Tri-Sucrose Vaccine 04/20/2019, 05/08/2019   PFIZER(Purple Top)SARS-COV-2 Vaccination 04/17/2019, 05/08/2019, 12/25/2019   PNEUMOCOCCAL CONJUGATE-20 03/03/2024   Pneumococcal Conjugate-13  02/24/2016, 07/08/2016   Pneumococcal Polysaccharide-23 05/07/2015   Respiratory Syncytial Virus Vaccine,Recomb Aduvanted(Arexvy) 01/04/2022, 07/02/2022   Tdap 08/23/2013, 08/02/2022   Unspecified SARS-COV-2 Vaccination 03/03/2024   Zoster Recombinant(Shingrix) 07/30/2017, 12/04/2017, 09/02/2018   Zoster, Live 10/03/2015   Pertinent  Health Maintenance Due  Topic Date Due   Influenza Vaccine  Completed      01/16/2022   12:11 PM 02/23/2024    4:28 PM 02/28/2024    2:15 PM 03/27/2024   12:43 PM 04/04/2024    1:40 PM  Fall Risk  Falls in the past year?  1 1 1 1   Was there an injury with Fall?  1 1 1 1   Fall Risk Category Calculator  3 3 3 3   (RETIRED) Patient Fall Risk Level High fall risk       Patient at  Risk for Falls Due to  History of fall(s);Impaired balance/gait History of fall(s);Impaired balance/gait History of fall(s);Impaired balance/gait History of fall(s);Impaired balance/gait  Fall risk Follow up  Falls evaluation completed Falls evaluation completed Falls evaluation completed Falls evaluation completed     Data saved with a previous flowsheet row definition   Functional Status Survey:    Vitals:   04/13/24 1127  BP: 133/80  Pulse: 76  Resp: 18  Temp: (!) 97.3 F (36.3 C)  SpO2: 94%  Weight: 192 lb 6.4 oz (87.3 kg)  Height: 6' 4 (1.93 m)   Body mass index is 23.42 kg/m. Physical Exam***  Labs reviewed: Recent Labs    11/18/23 1448 12/21/23 1448 12/23/23 0224 12/24/23 0418 12/25/23 0320 02/20/24 0750 02/25/24 0000 02/28/24 0000  NA 138   < > 137 137 137 136 138 137  K 3.8   < > 3.2* 4.6 4.0 4.0 4.5 4.4  CL 103   < > 106 105 103 102 100 100  CO2 21*   < > 23 26 23 23  28* 28*  GLUCOSE 121*   < > 85 142* 125* 90  --   --   BUN 10   < > 12 12 18 12  22* 30*  CREATININE 0.96   < > 0.78 0.81 0.81 0.84 1.1 0.9  CALCIUM 9.6   < > 8.5* 9.2 8.8* 9.2 9.7 9.0  MG 2.0  --  2.1 2.2  --   --   --   --    < > = values in this interval not displayed.   Recent  Labs    11/18/23 1448 12/21/23 1448 02/20/24 0750 02/25/24 0000 04/03/24 0000  AST 31 23 22 17 15   ALT 24 21 19 17 12   ALKPHOS 87 68 87 80 69  BILITOT 1.0 1.1 0.5  --   --   PROT 7.1 7.4 6.7  --   --   ALBUMIN 3.5 3.8 3.7 4.0 3.1*   Recent Labs    12/22/23 0537 12/23/23 0224 12/24/23 0418 02/20/24 0750 02/25/24 0000 02/28/24 0000  WBC 7.2 5.4  --  9.3 10.5 9.6  NEUTROABS  --   --   --   --  7,991.00 6,470.00  HGB 14.2 12.9*   < > 14.2 16.3 15.4  HCT 42.5 39.1  --  42.6 49 48  MCV 92.6 93.3  --  92.0  --   --   PLT 226 197  --  290 339 297   < > = values in this interval not displayed.   Lab Results  Component Value Date   TSH 0.866 02/21/2018   No results found for: HGBA1C No results found for: CHOL, HDL, LDLCALC, LDLDIRECT, TRIG, CHOLHDL  Significant Diagnostic Results in last 30 days:  No results found.  Assessment/Plan No problem-specific Assessment & Plan notes found for this encounter.    Raahil Ong K. Caro BODILY [facility] [phone]       [1]  Allergies Allergen Reactions   Ceftriaxone Anaphylaxis and Rash   Donepezil Other (See Comments)    Hallucination, confusion  Other Reaction(s): Hallucinations (code- 2988998, SNOMED CT); Confusional state (code- 713066996, SNOMED CT), Not available  donepezil  Per pt 's wife report:  He goes absolutely nuts    donepezil    Hallucination, confusion   Erythromycin Swelling and Other (See Comments)   Penicillins Swelling and Rash    Has patient had a PCN reaction causing immediate rash,  facial/tongue/throat swelling, SOB or lightheadedness with hypotension: Yes  Has patient had a PCN reaction causing severe rash involving mucus membranes or skin necrosis: No  Has patient had a PCN reaction that required hospitalization: Unknown  Has patient had a PCN reaction occurring within the last 10 years: No  If all of the above answers are NO, then may proceed  with Cephalosporin use.  Product containing penicillin (product)   Ciprofloxacin Other (See Comments)   Keflex [Cephalexin] Swelling   Penicillamine Other (See Comments)   "

## 2024-04-20 LAB — CBC: RBC: 4.41 (ref 3.87–5.11)

## 2024-04-20 LAB — CBC AND DIFFERENTIAL
HCT: 42 (ref 41–53)
Hemoglobin: 13.6 (ref 13.5–17.5)
Neutrophils Absolute: 6348
Platelets: 286 10*3/uL (ref 150–400)
WBC: 9.2

## 2024-04-21 ENCOUNTER — Other Ambulatory Visit: Payer: Self-pay | Admitting: Orthopedic Surgery

## 2024-04-21 ENCOUNTER — Telehealth: Payer: Self-pay

## 2024-04-21 DIAGNOSIS — F03911 Unspecified dementia, unspecified severity, with agitation: Secondary | ICD-10-CM

## 2024-04-21 MED ORDER — LORAZEPAM 0.5 MG PO TABS: ORAL_TABLET | ORAL | 0 refills | Status: AC

## 2024-04-21 MED ORDER — LORAZEPAM 0.5 MG PO TABS: 0.5000 mg | ORAL_TABLET | Freq: Three times a day (TID) | ORAL | 0 refills | Status: DC | PRN

## 2024-04-21 NOTE — Telephone Encounter (Signed)
 New prescription with clarification sent to [facility].

## 2024-04-27 ENCOUNTER — Non-Acute Institutional Stay (SKILLED_NURSING_FACILITY): Payer: Self-pay | Admitting: Internal Medicine

## 2024-04-27 ENCOUNTER — Encounter: Payer: Self-pay | Admitting: Internal Medicine

## 2024-04-27 DIAGNOSIS — F02B2 Dementia in other diseases classified elsewhere, moderate, with psychotic disturbance: Secondary | ICD-10-CM

## 2024-04-27 DIAGNOSIS — N400 Enlarged prostate without lower urinary tract symptoms: Secondary | ICD-10-CM

## 2024-04-27 DIAGNOSIS — Z86711 Personal history of pulmonary embolism: Secondary | ICD-10-CM

## 2024-04-27 DIAGNOSIS — I480 Paroxysmal atrial fibrillation: Secondary | ICD-10-CM

## 2024-04-27 DIAGNOSIS — Z993 Dependence on wheelchair: Secondary | ICD-10-CM

## 2024-04-27 DIAGNOSIS — F419 Anxiety disorder, unspecified: Secondary | ICD-10-CM

## 2024-04-27 DIAGNOSIS — Z5181 Encounter for therapeutic drug level monitoring: Secondary | ICD-10-CM | POA: Insufficient documentation

## 2024-04-27 DIAGNOSIS — G301 Alzheimer's disease with late onset: Secondary | ICD-10-CM

## 2024-04-27 DIAGNOSIS — F330 Major depressive disorder, recurrent, mild: Secondary | ICD-10-CM

## 2024-04-27 DIAGNOSIS — Z7901 Long term (current) use of anticoagulants: Secondary | ICD-10-CM

## 2024-04-27 NOTE — Assessment & Plan Note (Signed)
 Continue Eliquis  for A-fib.  He has a history of bradycardia and is not on any AV nodal blocker agents.

## 2024-04-27 NOTE — Progress Notes (Signed)
 [facility] Routine Visit Progress Note    Location:  Other [facility]) Nursing Home Room Number: [place] [facility] A Place of Service:  SNF [phone])   Laurence Locus, DO   Patient Care Team: Sadie Manna, MD as PCP - General (Internal Medicine) Ortel, Debby Ruth, MD as Referring Physician (Hematology and Oncology)   Extended Emergency Contact Information Primary Emergency Contact: Lard,[name] S Address: [address]          [place], [place] [address] [place] Home Phone: [phone] Mobile Phone: [phone] Relation: Spouse Secondary Emergency Contact: Gens,Arkin  [place] Mobile Phone: [phone] Relation: Son   Goals of care: Advanced Directive information    03/02/2024    8:36 AM  Advanced Directives  Does Patient Have a Medical Advance Directive? No  Would patient like information on creating a medical advance directive? No - Patient declined    CODE STATUS: Full Code   Chief Complaint  Patient presents with   Routine Visit     HPI: Pt is a 84 y.o. male seen today for medical management of chronic disease.   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  Patient is had worsening agitation and behavior.  He has had multiple falls.  He was placed on Ativan  due to worsening aggression and dementia.  Mostly sundowning around the late afternoon.  His behavior has seemed to worsen since the end of Christmas.  He yells for his wife frequently.  His wife does not visit on a daily basis.  His wife has hired psychologist, sport and exercise.  He is now on multiple medications to help with his behavior including Ativan  0.5 mgEvery 8 hours as needed for  agitation/anxiety.  X 14-day, Lexapro 10 mg once daily, Namenda  10 mg twice daily, Seroquel   37.5 mg q. bedtime, Depakote 125 mg twice daily, scheduled Ativan  0.5 mg  q.2 PM   he now has a afternoon sitter that is hired privately by the family.    He has had multiple falls after patient tries to stand up when he knows he cannot walk.    Multiple none medication interventions have been tried including hourly rounding, placing the patient in the TV room to be surrounded by other residents, patient still having aggressive behavior especially around late noon and evening.   Past Medical History:  Diagnosis Date   Anxiety    Atrial fibrillation (HCC)    Back pain, chronic    Cancer (HCC)    skin   Colon polyp    Cyst of left kidney    Dementia (HCC)    GERD (gastroesophageal reflux disease)    Heart murmur    Hyperlipemia    Mitral valve prolapse    Neuromuscular disorder (HCC)    Neuropathy    Osteoarthritis    Pulmonary emboli (HCC)    Pulmonary embolus (HCC) 09/11/2011   Overview:   Idiopathic pulmonary embolus.   A. Presentation September 2006 with pleuritic chest pain,        shortness of breath, and lightheadedness.   B. Patient symptomatic for one week and subsequently        diagnosed by chest x-ray abnormalities and CT scan confirmation.   C. No risk factors identified for  pulmonary embolus.   D. Diagnostic studies:        1. heterozygous for FVL        2. O   Spinal stenosis    Thyroid  nodule    Vertigo    Past Surgical History:  Procedure Laterality Date   APPENDECTOMY     BACK SURGERY     COLONOSCOPY N/A 08/15/2014   Procedure: COLONOSCOPY;  Surgeon: Lamar ONEIDA Holmes, MD;  Location: [facility] ENDOSCOPY;  Service: Endoscopy;  Laterality: N/A;   ESOPHAGOGASTRODUODENOSCOPY N/A 08/15/2014   Procedure: ESOPHAGOGASTRODUODENOSCOPY (EGD);  Surgeon: Lamar ONEIDA Holmes, MD;  Location: [facility] ENDOSCOPY;  Service: Endoscopy;  Laterality: N/A;   ESOPHAGOGASTRODUODENOSCOPY N/A 12/22/2023    Procedure: EGD (ESOPHAGOGASTRODUODENOSCOPY);  Surgeon: Jinny Carmine, MD;  Location: [facility] ENDOSCOPY;  Service: Endoscopy;  Laterality: N/A;   IR KYPHO LUMBAR INC FX REDUCE BONE BX UNI/BIL CANNULATION INC/IMAGING  10/22/2022   IR RADIOLOGIST EVAL & MGMT  10/15/2022   IR RADIOLOGIST EVAL & MGMT  11/03/2022   IR RADIOLOGIST EVAL & MGMT  11/05/2022   TONSILLECTOMY       Allergies[1]   Outpatient Encounter Medications as of 04/27/2024  Medication Sig   apixaban  (ELIQUIS ) 2.5 MG TABS tablet Take 1 tablet by mouth every 12 (twelve) hours.   bisacodyl  (DULCOLAX) 10 MG suppository Place 10 mg rectally as needed for moderate constipation.   cholecalciferol (VITAMIN D3) 10 MCG (400 UNIT) TABS tablet Take 400 Units by mouth daily.   Coenzyme Q10 (CO Q 10 PO) Take 1 Dose by mouth daily.    Cranberry 250 MG TABS Take 1 tablet (250 mg total) by mouth daily.   Cyanocobalamin  (B-12) 5000 MCG CAPS Take 5,000 mg by mouth daily.   divalproex (DEPAKOTE) 125 MG DR tablet Take 250 mg by mouth 2 (two) times daily. Give at 0800 and 1400   docusate sodium  (COLACE) 100 MG capsule Take 300 mg by mouth at bedtime.   escitalopram (LEXAPRO) 10 MG tablet Take 10 mg by mouth daily.   esomeprazole (NEXIUM) 40 MG capsule Take 1 capsule by mouth daily.   finasteride  (PROSCAR ) 5 MG tablet TAKE 1 TABLET BY MOUTH DAILY   folic acid  (FOLVITE ) 1 MG tablet Take 1 mg by mouth daily.   LORazepam  (ATIVAN ) 0.5 MG tablet Take 1 tablet (0.5 mg total) by mouth daily at 2 PM. May also take 1 tablet (0.5 mg total) by mouth 3 (three) times daily as needed for anxiety. Do all this for 14 days. Give at 2 pm with Depakote for agitation.   meclizine  (ANTIVERT ) 25 MG tablet Take 1 tablet (25 mg total) by mouth 3 (three) times daily as needed for dizziness.   memantine  (NAMENDA ) 10 MG tablet Take 10 mg by mouth See admin instructions. 10 mg by mouth at bedtime for antidepressant  Give 10 mg by mouth in the morning for Antidepressant   nystatin   (MYCOSTATIN /NYSTOP ) powder Apply 1 Application topically 3 (three) times daily.   polyethylene glycol (MIRALAX  / GLYCOLAX ) 17 g packet Take 17 g by mouth daily as needed for moderate constipation.   QUEtiapine  (SEROQUEL ) 25 MG tablet Take 25 mg by mouth 2 (two) times daily. Give at 1400 and 2100 (Patient taking differently: Take 37.5 mg by mouth at bedtime.)   senna (SENOKOT) 8.6 MG TABS tablet Take 1 tablet (8.6 mg total) by mouth daily as needed for mild constipation.   simvastatin  (ZOCOR ) 40 MG tablet Take 40 mg by mouth daily.   tamsulosin  (FLOMAX ) 0.4 MG CAPS  capsule Take 1 capsule (0.4 mg total) by mouth daily.   lidocaine  (XYLOCAINE ) 4 % external solution Apply 5 mLs topically 4 (four) times daily. (Patient not taking: Reported on 04/27/2024)   memantine  (NAMENDA ) 5 MG tablet Take 5 mg by mouth 2 (two) times daily. (Patient not taking: Reported on 04/27/2024)   [DISCONTINUED] buPROPion  (WELLBUTRIN  XL) 150 MG 24 hr tablet Take 150 mg by mouth daily. (Patient not taking: Reported on 04/27/2024)   [DISCONTINUED] FLUoxetine  (PROZAC ) 40 MG capsule TAKE 2 CAPSULES (80MG  TOTAL) BY MOUTH ONCE DAILY (Patient not taking: Reported on 04/27/2024)   No facility-administered encounter medications on file as of 04/27/2024.     Review of Systems  Unable to perform ROS: Dementia      Immunization History  Administered Date(s) Administered   Fluzone Influenza virus vaccine,trivalent (IIV3), split virus 01/03/2024   INFLUENZA, HIGH DOSE SEASONAL PF 12/07/2018   Influenza Split 01/02/2015   Influenza-Unspecified 01/04/2012, 01/08/2012, 01/18/2013, 12/27/2013, 01/02/2015, 12/06/2015, 12/11/2015, 12/23/2016, 12/31/2017, 11/30/2019, 01/22/2021   PFIZER Comirnaty(Gray Top)Covid-19 Tri-Sucrose Vaccine 04/20/2019, 05/08/2019   PFIZER(Purple Top)SARS-COV-2 Vaccination 04/17/2019, 05/08/2019, 12/25/2019   PNEUMOCOCCAL CONJUGATE-20 03/03/2024   Pneumococcal Conjugate-13 02/24/2016, 07/08/2016   Pneumococcal  Polysaccharide-23 05/07/2015   Respiratory Syncytial Virus Vaccine,Recomb Aduvanted(Arexvy) 01/04/2022, 07/02/2022   Tdap 08/23/2013, 08/02/2022   Unspecified SARS-COV-2 Vaccination 03/03/2024   Zoster Recombinant(Shingrix) 07/30/2017, 12/04/2017, 09/02/2018   Zoster, Live 10/03/2015   Pertinent  Health Maintenance Due  Topic Date Due   Influenza Vaccine  Completed      01/16/2022   12:11 PM 02/23/2024    4:28 PM 02/28/2024    2:15 PM 03/27/2024   12:43 PM 04/04/2024    1:40 PM  Fall Risk  Falls in the past year?  1 1 1 1   Was there an injury with Fall?  1 1 1 1   Fall Risk Category Calculator  3 3 3 3   (RETIRED) Patient Fall Risk Level High fall risk       Patient at Risk for Falls Due to  History of fall(s);Impaired balance/gait History of fall(s);Impaired balance/gait History of fall(s);Impaired balance/gait History of fall(s);Impaired balance/gait  Fall risk Follow up  Falls evaluation completed Falls evaluation completed Falls evaluation completed Falls evaluation completed     Data saved with a previous flowsheet row definition   Functional Status Survey:     Vitals:   04/27/24 1108  BP: 136/62  Pulse: 62  Resp: 18  Temp: 97.8 F (36.6 C)  SpO2: 96%  Weight: 201 lb 6.4 oz (91.4 kg)  Height: 6' 4 (1.93 m)   Body mass index is 24.52 kg/m. Physical Exam Vitals and nursing note reviewed.  Constitutional:      General: He is not in acute distress.    Appearance: He is not toxic-appearing or diaphoretic.  HENT:     Head: Normocephalic and atraumatic.  Cardiovascular:     Rate and Rhythm: Normal rate and regular rhythm.  Pulmonary:     Effort: Pulmonary effort is normal.  Abdominal:     General: Bowel sounds are normal.     Palpations: Abdomen is soft.  Musculoskeletal:     Comments: Sitting in his wheelchair in his bedroom.  Skin:    General: Skin is warm and dry.     Capillary Refill: Capillary refill takes less than 2 seconds.  Neurological:     Mental  Status: He is disoriented.     Comments: Hard of hearing.  Responds to his name.  Not oriented to place  or time.  Private duty sitter at bedside.      Labs reviewed: Recent Labs    11/18/23 1448 12/21/23 1448 12/23/23 0224 12/24/23 0418 12/25/23 0320 02/20/24 0750 02/25/24 0000 02/28/24 0000  NA 138   < > 137 137 137 136 138 137  K 3.8   < > 3.2* 4.6 4.0 4.0 4.5 4.4  CL 103   < > 106 105 103 102 100 100  CO2 21*   < > 23 26 23 23  28* 28*  GLUCOSE 121*   < > 85 142* 125* 90  --   --   BUN 10   < > 12 12 18 12  22* 30*  CREATININE 0.96   < > 0.78 0.81 0.81 0.84 1.1 0.9  CALCIUM 9.6   < > 8.5* 9.2 8.8* 9.2 9.7 9.0  MG 2.0  --  2.1 2.2  --   --   --   --    < > = values in this interval not displayed.   Recent Labs    11/18/23 1448 12/21/23 1448 02/20/24 0750 02/25/24 0000 04/03/24 0000  AST 31 23 22 17 15   ALT 24 21 19 17 12   ALKPHOS 87 68 87 80 69  BILITOT 1.0 1.1 0.5  --   --   PROT 7.1 7.4 6.7  --   --   ALBUMIN 3.5 3.8 3.7 4.0 3.1*   Recent Labs    12/22/23 0537 12/23/23 0224 12/24/23 0418 02/20/24 0750 02/25/24 0000 02/28/24 0000 04/20/24 0000  WBC 7.2 5.4  --  9.3 10.5 9.6 9.2  NEUTROABS  --   --   --   --  7,991.00 6,470.00 6,348.00  HGB 14.2 12.9*   < > 14.2 16.3 15.4 13.6  HCT 42.5 39.1  --  42.6 49 48 42  MCV 92.6 93.3  --  92.0  --   --   --   PLT 226 197  --  290 339 297 286   < > = values in this interval not displayed.   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  He has been placed on multiple agents to help this including afternoon Ativan , bedtime Seroquel , twice a day Depakote.  He remains on Namenda  twice daily, Lexapro 10 mg daily.  He is all have had minor effects in controlling his behavior.  He still calls out for his wife every day.  His wife does  visit him every day.  Difficult to control his aggressive behaviors with these low-dose medications.  He is a extreme fall risk as he does not ask for help when he tries to stand up.  Adding extra additional sedative medications would likely dramatically increase his fall risk.  He has already had numerous falls.  Gradual dose reduction therapy is not indicated at this time as his behavior is actually getting worse not better.  Encounter for medication titration 04/27/24 Patient has exacerbations of psychosis especially at nighttime consistent with sundowning.  He has been placed on multiple agents to help this including afternoon Ativan , bedtime Seroquel , twice a day Depakote.  He remains on Namenda  twice daily, Lexapro 10 mg daily.  He is all have had minor effects in controlling his behavior.  He still calls out for his wife every day.  His wife does visit him every day.  Difficult to control his aggressive behaviors with these low-dose medications.  He is a extreme fall risk as he does not ask for help when he tries to stand up.  Adding extra additional sedative medications would likely dramatically increase his fall risk.  He has already had numerous falls.  Gradual dose reduction therapy is not indicated at this time as his behavior is actually getting worse not better. His Wellbutrin  and Prozac  have been discontinued.  Wheelchair dependent Patient is wheelchair dependent now.  He is not able to ambulate  Anxiety Continue with as needed and scheduled Ativan .  He remains on Lexapro.  His Wellbutrin  and Prozac  have been discontinued.  Chronic anticoagulation Continue Eliquis  for history of PE and paroxysmal atrial fibrillation.  PAF (paroxysmal atrial fibrillation) (HCC) Continue Eliquis  for A-fib.  He has a history of bradycardia and is not on any AV nodal blocker agents.  Benign prostatic hyperplasia Stable.  He remains on Proscar  and Flomax .  History of pulmonary embolus (PE) Continue  Eliquis  twice daily.  MDD (major depressive disorder), recurrent episode, mild Continue Lexapro 10 mg a day.    No orders of the defined types were placed in this encounter.  Medications Discontinued During This Encounter  Medication Reason   buPROPion  (WELLBUTRIN  XL) 150 MG 24 hr tablet Completed Course   FLUoxetine  (PROZAC ) 40 MG capsule Completed Course   Orders Placed This Encounter  Procedures   CBC and differential    This external order was created through the Results Console.   CBC    This external order was created through the Results Console.    Camellia Door, DO [facility] [phone]     [1]  Allergies Allergen Reactions   Ceftriaxone Anaphylaxis and Rash   Donepezil Other (See Comments)    Hallucination, confusion  Other Reaction(s): Hallucinations (code- 2988998, SNOMED CT); Confusional state (code- 713066996, SNOMED CT), Not available  donepezil  Per pt 's wife report:  He goes absolutely nuts    donepezil    Hallucination, confusion   Erythromycin Swelling and Other (See Comments)   Penicillins Swelling and Rash    Has patient had a PCN reaction causing immediate rash, facial/tongue/throat swelling, SOB or lightheadedness with hypotension: Yes  Has patient had a PCN reaction causing severe rash involving mucus membranes or skin necrosis: No  Has patient had a PCN reaction that required hospitalization: Unknown  Has patient had a PCN reaction occurring within the last 10 years: No  If all of the above answers are NO, then may proceed with Cephalosporin use.  Product containing penicillin (product)   Ciprofloxacin Other (See Comments)   Keflex [Cephalexin] Swelling   Penicillamine Other (See Comments)

## 2024-04-27 NOTE — Assessment & Plan Note (Signed)
 Continue Eliquis  for history of PE and paroxysmal atrial fibrillation.

## 2024-04-27 NOTE — Assessment & Plan Note (Addendum)
 Patient has exacerbations of psychosis especially at nighttime consistent with sundowning.  He has been placed on multiple agents to help this including afternoon Ativan , bedtime Seroquel , twice a day Depakote.  He remains on Namenda  twice daily, Lexapro 10 mg daily.  He is all have had minor effects in controlling his behavior.  He still calls out for his wife every day.  His wife does visit him every day.  Difficult to control his aggressive behaviors with these low-dose medications.  He is a extreme fall risk as he does not ask for help when he tries to stand up.  Adding extra additional sedative medications would likely dramatically increase his fall risk.  He has already had numerous falls.  Gradual dose reduction therapy is not indicated at this time as his behavior is actually getting worse not better.

## 2024-04-27 NOTE — Assessment & Plan Note (Signed)
 Continue with as needed and scheduled Ativan .  He remains on Lexapro.  His Wellbutrin  and Prozac  have been discontinued.

## 2024-04-27 NOTE — Assessment & Plan Note (Signed)
 Stable.  He remains on Proscar  and Flomax .

## 2024-04-27 NOTE — Assessment & Plan Note (Signed)
Continue Lexapro 10 mg a day.

## 2024-04-27 NOTE — Assessment & Plan Note (Signed)
-  Continue Eliquis  twice daily

## 2024-04-27 NOTE — Assessment & Plan Note (Signed)
 Patient is wheelchair dependent now.  He is not able to ambulate

## 2024-04-27 NOTE — Assessment & Plan Note (Addendum)
 04/27/24 Patient has exacerbations of psychosis especially at nighttime consistent with sundowning.  He has been placed on multiple agents to help this including afternoon Ativan , bedtime Seroquel , twice a day Depakote.  He remains on Namenda  twice daily, Lexapro 10 mg daily.  He is all have had minor effects in controlling his behavior.  He still calls out for his wife every day.  His wife does visit him every day.  Difficult to control his aggressive behaviors with these low-dose medications.  He is a extreme fall risk as he does not ask for help when he tries to stand up.  Adding extra additional sedative medications would likely dramatically increase his fall risk.  He has already had numerous falls.  Gradual dose reduction therapy is not indicated at this time as his behavior is actually getting worse not better. His Wellbutrin  and Prozac  have been discontinued.
